# Patient Record
Sex: Male | Born: 1935 | Race: White | Hispanic: No | Smoking: Never smoker
Health system: Southern US, Community
[De-identification: ages and names within clinical notes are randomized; demographics above are authoritative.]

## PROBLEM LIST (undated history)

## (undated) DIAGNOSIS — I509 Heart failure, unspecified: Secondary | ICD-10-CM

## (undated) DIAGNOSIS — F40240 Claustrophobia: Secondary | ICD-10-CM

## (undated) DIAGNOSIS — I5020 Unspecified systolic (congestive) heart failure: Secondary | ICD-10-CM

## (undated) DIAGNOSIS — C801 Malignant (primary) neoplasm, unspecified: Secondary | ICD-10-CM

## (undated) DIAGNOSIS — E785 Hyperlipidemia, unspecified: Secondary | ICD-10-CM

## (undated) DIAGNOSIS — H3320 Serous retinal detachment, unspecified eye: Secondary | ICD-10-CM

## (undated) DIAGNOSIS — I10 Essential (primary) hypertension: Secondary | ICD-10-CM

## (undated) DIAGNOSIS — Z87898 Personal history of other specified conditions: Secondary | ICD-10-CM

## (undated) DIAGNOSIS — I480 Paroxysmal atrial fibrillation: Secondary | ICD-10-CM

## (undated) DIAGNOSIS — R3915 Urgency of urination: Secondary | ICD-10-CM

## (undated) DIAGNOSIS — Z8546 Personal history of malignant neoplasm of prostate: Secondary | ICD-10-CM

## (undated) DIAGNOSIS — K219 Gastro-esophageal reflux disease without esophagitis: Secondary | ICD-10-CM

## (undated) DIAGNOSIS — R7301 Impaired fasting glucose: Secondary | ICD-10-CM

## (undated) DIAGNOSIS — I639 Cerebral infarction, unspecified: Secondary | ICD-10-CM

## (undated) DIAGNOSIS — M1990 Unspecified osteoarthritis, unspecified site: Secondary | ICD-10-CM

## (undated) DIAGNOSIS — I2510 Atherosclerotic heart disease of native coronary artery without angina pectoris: Secondary | ICD-10-CM

## (undated) DIAGNOSIS — I350 Nonrheumatic aortic (valve) stenosis: Secondary | ICD-10-CM

## (undated) DIAGNOSIS — I255 Ischemic cardiomyopathy: Secondary | ICD-10-CM

## (undated) HISTORY — PX: COLONOSCOPY W/ POLYPECTOMY: SHX1380

## (undated) HISTORY — DX: Personal history of malignant neoplasm of prostate: Z85.46

## (undated) HISTORY — DX: Unspecified osteoarthritis, unspecified site: M19.90

## (undated) HISTORY — PX: HERNIA REPAIR: SHX51

## (undated) HISTORY — PX: TONSILLECTOMY: SUR1361

## (undated) HISTORY — PX: UMBILICAL HERNIA REPAIR: SHX196

## (undated) HISTORY — DX: Essential (primary) hypertension: I10

## (undated) HISTORY — DX: Atherosclerotic heart disease of native coronary artery without angina pectoris: I25.10

## (undated) HISTORY — DX: Hyperlipidemia, unspecified: E78.5

## (undated) HISTORY — DX: Serous retinal detachment, unspecified eye: H33.20

## (undated) HISTORY — DX: Ischemic cardiomyopathy: I25.5

## (undated) HISTORY — DX: Paroxysmal atrial fibrillation: I48.0

## (undated) HISTORY — PX: EYE SURGERY: SHX253

## (undated) HISTORY — DX: Cerebral infarction, unspecified: I63.9

## (undated) HISTORY — PX: CARDIAC CATHETERIZATION: SHX172

## (undated) HISTORY — DX: Nonrheumatic aortic (valve) stenosis: I35.0

## (undated) HISTORY — DX: Unspecified systolic (congestive) heart failure: I50.20

## (undated) HISTORY — PX: INGUINAL HERNIA REPAIR: SUR1180

## (undated) HISTORY — DX: Impaired fasting glucose: R73.01

---
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
## 1994-04-22 HISTORY — PX: PROSTATE SURGERY: SHX751

## 2004-01-25 ENCOUNTER — Ambulatory Visit: Payer: Self-pay | Admitting: Unknown Physician Specialty

## 2004-03-26 ENCOUNTER — Other Ambulatory Visit: Payer: Self-pay

## 2004-03-27 ENCOUNTER — Inpatient Hospital Stay: Payer: Self-pay | Admitting: Internal Medicine

## 2005-04-22 LAB — HM COLONOSCOPY

## 2005-10-29 ENCOUNTER — Ambulatory Visit: Payer: Self-pay | Admitting: Internal Medicine

## 2006-04-19 ENCOUNTER — Inpatient Hospital Stay: Payer: Self-pay | Admitting: Internal Medicine

## 2006-04-19 ENCOUNTER — Other Ambulatory Visit: Payer: Self-pay

## 2006-05-01 ENCOUNTER — Ambulatory Visit: Payer: Self-pay | Admitting: Internal Medicine

## 2006-05-01 LAB — CONVERTED CEMR LAB
ALT: 24 units/L (ref 0–40)
Albumin: 3.2 g/dL — ABNORMAL LOW (ref 3.5–5.2)
BUN: 17 mg/dL (ref 6–23)
Basophils Absolute: 0 10*3/uL (ref 0.0–0.1)
Basophils Relative: 0.3 % (ref 0.0–1.0)
CO2: 31 meq/L (ref 19–32)
Calcium: 9.2 mg/dL (ref 8.4–10.5)
Chloride: 100 meq/L (ref 96–112)
Chol/HDL Ratio, serum: 3.2
Cholesterol: 160 mg/dL (ref 0–200)
Creatinine, Ser: 1.4 mg/dL (ref 0.4–1.5)
Eosinophil percent: 0.4 % (ref 0.0–5.0)
GFR calc non Af Amer: 53 mL/min
Glomerular Filtration Rate, Af Am: 64 mL/min/{1.73_m2}
Glucose, Bld: 93 mg/dL (ref 70–99)
HCT: 45.8 % (ref 39.0–52.0)
HDL: 50.4 mg/dL (ref >39.0)
Hemoglobin: 15.5 g/dL (ref 13.0–17.0)
Hgb A1c MFr Bld: 6.4 % — ABNORMAL HIGH (ref 4.6–6.0)
LDL Cholesterol: 86 mg/dL (ref 0–99)
Lymphocytes Relative: 25.5 % (ref 12.0–46.0)
MCHC: 33.9 g/dL (ref 30.0–36.0)
MCV: 92.6 fL (ref 78.0–100.0)
Monocytes Absolute: 1 10*3/uL — ABNORMAL HIGH (ref 0.2–0.7)
Monocytes Relative: 8.3 % (ref 3.0–11.0)
Neutro Abs: 8.2 10*3/uL — ABNORMAL HIGH (ref 1.4–7.7)
Neutrophils Relative %: 65.5 % (ref 43.0–77.0)
PSA: 0.01 ng/mL — ABNORMAL LOW (ref 0.10–4.00)
Phosphorus Concentration: 3 mg/dL (ref 2.3–4.6)
Platelets: 366 10*3/uL (ref 150–400)
Potassium: 3.6 meq/L (ref 3.5–5.1)
RBC: 4.95 M/uL (ref 4.22–5.81)
RDW: 14.1 % (ref 11.5–14.6)
Sodium: 138 meq/L (ref 135–145)
Triglyceride fasting, serum: 119 mg/dL (ref 0–149)
VLDL: 24 mg/dL (ref 0–40)
WBC: 12.3 10*3/uL — ABNORMAL HIGH (ref 4.5–10.5)

## 2006-06-05 ENCOUNTER — Ambulatory Visit: Payer: Self-pay | Admitting: Internal Medicine

## 2006-08-21 HISTORY — PX: RETINAL DETACHMENT REPAIR W/ SCLERAL BUCKLE LE: SHX2338

## 2006-09-17 ENCOUNTER — Ambulatory Visit: Payer: Self-pay | Admitting: Internal Medicine

## 2006-09-17 DIAGNOSIS — E785 Hyperlipidemia, unspecified: Secondary | ICD-10-CM | POA: Insufficient documentation

## 2006-09-17 DIAGNOSIS — I10 Essential (primary) hypertension: Secondary | ICD-10-CM | POA: Insufficient documentation

## 2006-09-17 DIAGNOSIS — M159 Polyosteoarthritis, unspecified: Secondary | ICD-10-CM | POA: Insufficient documentation

## 2006-09-18 ENCOUNTER — Ambulatory Visit (HOSPITAL_COMMUNITY): Admission: RE | Admit: 2006-09-18 | Discharge: 2006-09-19 | Payer: Self-pay

## 2006-10-08 ENCOUNTER — Ambulatory Visit: Payer: Self-pay | Admitting: Family Medicine

## 2006-10-09 ENCOUNTER — Encounter (INDEPENDENT_AMBULATORY_CARE_PROVIDER_SITE_OTHER): Payer: Self-pay | Admitting: Internal Medicine

## 2006-10-09 LAB — CONVERTED CEMR LAB
Basophils Absolute: 0 10*3/uL (ref 0.0–0.1)
Basophils Relative: 0 % (ref 0–1)
Eosinophils Absolute: 0.2 10*3/uL (ref 0.0–0.7)
Eosinophils Relative: 2 % (ref 0–5)
HCT: 48.1 % (ref 39.0–52.0)
Hemoglobin: 15.8 g/dL (ref 13.0–17.0)
Lymphocytes Relative: 14 % (ref 12–46)
Lymphs Abs: 1.2 10*3/uL (ref 0.7–3.3)
MCHC: 32.8 g/dL (ref 30.0–36.0)
MCV: 92.3 fL (ref 78.0–100.0)
Monocytes Absolute: 1.2 10*3/uL — ABNORMAL HIGH (ref 0.2–0.7)
Monocytes Relative: 13 % — ABNORMAL HIGH (ref 3–11)
Neutro Abs: 6.1 10*3/uL (ref 1.7–7.7)
Neutrophils Relative %: 71 % (ref 43–77)
Platelets: 246 10*3/uL (ref 150–400)
RBC: 5.21 M/uL (ref 4.22–5.81)
RDW: 14.4 % — ABNORMAL HIGH (ref 11.5–14.0)
WBC: 8.6 10*3/uL (ref 4.0–10.5)

## 2006-11-21 ENCOUNTER — Telehealth (INDEPENDENT_AMBULATORY_CARE_PROVIDER_SITE_OTHER): Payer: Self-pay | Admitting: *Deleted

## 2006-11-27 ENCOUNTER — Telehealth (INDEPENDENT_AMBULATORY_CARE_PROVIDER_SITE_OTHER): Payer: Self-pay | Admitting: Internal Medicine

## 2007-01-15 ENCOUNTER — Ambulatory Visit: Payer: Self-pay | Admitting: Internal Medicine

## 2007-01-16 LAB — CONVERTED CEMR LAB
ALT: 16 units/L (ref 0–53)
Albumin: 3.9 g/dL (ref 3.5–5.2)
BUN: 15 mg/dL (ref 6–23)
CO2: 32 meq/L (ref 19–32)
Calcium: 9.6 mg/dL (ref 8.4–10.5)
Chloride: 106 meq/L (ref 96–112)
Cholesterol: 170 mg/dL (ref 0–200)
Creatinine, Ser: 1.2 mg/dL (ref 0.4–1.5)
GFR calc Af Amer: 76 mL/min
GFR calc non Af Amer: 63 mL/min
Glucose, Bld: 105 mg/dL — ABNORMAL HIGH (ref 70–99)
HDL: 55.9 mg/dL (ref >39.0)
LDL Cholesterol: 96 mg/dL (ref 0–99)
PSA: 0.01 ng/mL — ABNORMAL LOW (ref 0.10–4.00)
Phosphorus: 3.3 mg/dL (ref 2.3–4.6)
Potassium: 4 meq/L (ref 3.5–5.1)
Sodium: 144 meq/L (ref 135–145)
Total CHOL/HDL Ratio: 3
Triglycerides: 91 mg/dL (ref 0–149)
VLDL: 18 mg/dL (ref 0–40)

## 2007-03-23 ENCOUNTER — Telehealth (INDEPENDENT_AMBULATORY_CARE_PROVIDER_SITE_OTHER): Payer: Self-pay | Admitting: *Deleted

## 2007-07-15 ENCOUNTER — Ambulatory Visit: Payer: Self-pay | Admitting: Internal Medicine

## 2007-07-15 DIAGNOSIS — R7301 Impaired fasting glucose: Secondary | ICD-10-CM | POA: Insufficient documentation

## 2007-07-17 LAB — CONVERTED CEMR LAB
ALT: 17 units/L (ref 0–53)
AST: 22 units/L (ref 0–37)
Albumin: 3.8 g/dL (ref 3.5–5.2)
Alkaline Phosphatase: 56 units/L (ref 39–117)
BUN: 13 mg/dL (ref 6–23)
Basophils Absolute: 0 10*3/uL (ref 0.0–0.1)
Basophils Relative: 0.5 % (ref 0.0–1.0)
Bilirubin, Direct: 0.1 mg/dL (ref 0.0–0.3)
CO2: 32 meq/L (ref 19–32)
Calcium: 9.5 mg/dL (ref 8.4–10.5)
Chloride: 104 meq/L (ref 96–112)
Cholesterol: 169 mg/dL (ref 0–200)
Creatinine, Ser: 1.3 mg/dL (ref 0.4–1.5)
Eosinophils Absolute: 0.3 10*3/uL (ref 0.0–0.6)
Eosinophils Relative: 3.1 % (ref 0.0–5.0)
GFR calc Af Amer: 69 mL/min
GFR calc non Af Amer: 57 mL/min
Glucose, Bld: 109 mg/dL — ABNORMAL HIGH (ref 70–99)
HCT: 45.8 % (ref 39.0–52.0)
HDL: 49.7 mg/dL (ref >39.0)
Hemoglobin: 15 g/dL (ref 13.0–17.0)
LDL Cholesterol: 106 mg/dL — ABNORMAL HIGH (ref 0–99)
Lymphocytes Relative: 33 % (ref 12.0–46.0)
MCHC: 32.8 g/dL (ref 30.0–36.0)
MCV: 93.2 fL (ref 78.0–100.0)
Monocytes Absolute: 0.8 10*3/uL — ABNORMAL HIGH (ref 0.2–0.7)
Monocytes Relative: 8.8 % (ref 3.0–11.0)
Neutro Abs: 5 10*3/uL (ref 1.4–7.7)
Neutrophils Relative %: 54.6 % (ref 43.0–77.0)
PSA: 0.01 ng/mL — ABNORMAL LOW (ref 0.10–4.00)
Phosphorus: 3.1 mg/dL (ref 2.3–4.6)
Platelets: 271 10*3/uL (ref 150–400)
Potassium: 4 meq/L (ref 3.5–5.1)
RBC: 4.92 M/uL (ref 4.22–5.81)
RDW: 13.3 % (ref 11.5–14.6)
Sodium: 141 meq/L (ref 135–145)
Total Bilirubin: 0.7 mg/dL (ref 0.3–1.2)
Total CHOL/HDL Ratio: 3.4
Total Protein: 7.3 g/dL (ref 6.0–8.3)
Triglycerides: 67 mg/dL (ref 0–149)
VLDL: 13 mg/dL (ref 0–40)
WBC: 9.1 10*3/uL (ref 4.5–10.5)

## 2008-01-15 ENCOUNTER — Ambulatory Visit: Payer: Self-pay | Admitting: Internal Medicine

## 2008-01-18 LAB — CONVERTED CEMR LAB
ALT: 12 units/L (ref 0–53)
AST: 19 units/L (ref 0–37)
Albumin: 3.9 g/dL (ref 3.5–5.2)
Alkaline Phosphatase: 61 units/L (ref 39–117)
BUN: 11 mg/dL (ref 6–23)
Bilirubin, Direct: 0.1 mg/dL (ref 0.0–0.3)
CO2: 29 meq/L (ref 19–32)
Calcium: 9.2 mg/dL (ref 8.4–10.5)
Chloride: 108 meq/L (ref 96–112)
Cholesterol: 158 mg/dL (ref 0–200)
Creatinine, Ser: 1.4 mg/dL (ref 0.4–1.5)
GFR calc Af Amer: 64 mL/min
GFR calc non Af Amer: 53 mL/min
Glucose, Bld: 97 mg/dL (ref 70–99)
HDL: 51.1 mg/dL (ref >39.0)
LDL Cholesterol: 87 mg/dL (ref 0–99)
Phosphorus: 2.7 mg/dL (ref 2.3–4.6)
Potassium: 3.8 meq/L (ref 3.5–5.1)
Sodium: 142 meq/L (ref 135–145)
Total Bilirubin: 0.8 mg/dL (ref 0.3–1.2)
Total CHOL/HDL Ratio: 3.1
Total Protein: 7.6 g/dL (ref 6.0–8.3)
Triglycerides: 98 mg/dL (ref 0–149)
VLDL: 20 mg/dL (ref 0–40)

## 2008-02-16 ENCOUNTER — Ambulatory Visit: Payer: 59 | Admitting: Otolaryngology

## 2008-02-18 ENCOUNTER — Ambulatory Visit: Payer: 59 | Admitting: Otolaryngology

## 2008-02-26 ENCOUNTER — Encounter: Payer: Self-pay | Admitting: Internal Medicine

## 2008-03-09 ENCOUNTER — Encounter: Payer: Self-pay | Admitting: Internal Medicine

## 2008-03-15 ENCOUNTER — Encounter: Payer: Self-pay | Admitting: Internal Medicine

## 2008-03-15 ENCOUNTER — Ambulatory Visit: Payer: 59 | Admitting: Otolaryngology

## 2008-03-24 ENCOUNTER — Ambulatory Visit: Payer: Self-pay | Admitting: Internal Medicine

## 2008-03-24 DIAGNOSIS — I639 Cerebral infarction, unspecified: Secondary | ICD-10-CM | POA: Insufficient documentation

## 2008-05-03 ENCOUNTER — Ambulatory Visit: Payer: Self-pay | Admitting: Internal Medicine

## 2008-05-04 ENCOUNTER — Encounter: Payer: Self-pay | Admitting: Internal Medicine

## 2008-05-05 ENCOUNTER — Telehealth: Payer: Self-pay | Admitting: Internal Medicine

## 2008-05-17 ENCOUNTER — Encounter: Admission: RE | Admit: 2008-05-17 | Discharge: 2008-05-17 | Payer: Self-pay | Admitting: Neurology

## 2008-06-07 ENCOUNTER — Ambulatory Visit: Payer: Self-pay | Admitting: Cardiology

## 2008-06-10 ENCOUNTER — Ambulatory Visit: Payer: Self-pay

## 2008-06-10 ENCOUNTER — Encounter: Payer: Self-pay | Admitting: Internal Medicine

## 2008-06-13 ENCOUNTER — Encounter: Payer: 59 | Admitting: Neurology

## 2008-06-20 ENCOUNTER — Encounter: Payer: 59 | Admitting: Neurology

## 2008-06-23 ENCOUNTER — Encounter: Payer: Self-pay | Admitting: Internal Medicine

## 2008-07-15 ENCOUNTER — Ambulatory Visit: Payer: Self-pay | Admitting: Internal Medicine

## 2008-07-18 LAB — CONVERTED CEMR LAB
ALT: 14 units/L (ref 0–53)
AST: 18 units/L (ref 0–37)
Albumin: 3.9 g/dL (ref 3.5–5.2)
Alkaline Phosphatase: 66 units/L (ref 39–117)
BUN: 8 mg/dL (ref 6–23)
Basophils Absolute: 0.1 10*3/uL (ref 0.0–0.1)
Basophils Relative: 0.7 % (ref 0.0–3.0)
Bilirubin, Direct: 0 mg/dL (ref 0.0–0.3)
CO2: 33 meq/L — ABNORMAL HIGH (ref 19–32)
Calcium: 9.4 mg/dL (ref 8.4–10.5)
Chloride: 106 meq/L (ref 96–112)
Cholesterol: 224 mg/dL — ABNORMAL HIGH (ref 0–200)
Creatinine, Ser: 1.2 mg/dL (ref 0.4–1.5)
Direct LDL: 154.1 mg/dL
Eosinophils Absolute: 0.2 10*3/uL (ref 0.0–0.7)
Eosinophils Relative: 2.2 % (ref 0.0–5.0)
GFR calc non Af Amer: 62.7 mL/min (ref >60)
Glucose, Bld: 100 mg/dL — ABNORMAL HIGH (ref 70–99)
HCT: 45.7 % (ref 39.0–52.0)
HDL: 54.1 mg/dL (ref >39.00)
Hemoglobin: 15.8 g/dL (ref 13.0–17.0)
Lymphocytes Relative: 33.5 % (ref 12.0–46.0)
Lymphs Abs: 3.1 10*3/uL (ref 0.7–4.0)
MCHC: 34.6 g/dL (ref 30.0–36.0)
MCV: 91.3 fL (ref 78.0–100.0)
Monocytes Absolute: 0.7 10*3/uL (ref 0.1–1.0)
Monocytes Relative: 7.6 % (ref 3.0–12.0)
Neutro Abs: 5.2 10*3/uL (ref 1.4–7.7)
Neutrophils Relative %: 56 % (ref 43.0–77.0)
Platelets: 206 10*3/uL (ref 150.0–400.0)
Potassium: 3.9 meq/L (ref 3.5–5.1)
RBC: 5 M/uL (ref 4.22–5.81)
RDW: 13.6 % (ref 11.5–14.6)
Sodium: 143 meq/L (ref 135–145)
TSH: 2.19 microintl units/mL (ref 0.35–5.50)
Total Bilirubin: 1.1 mg/dL (ref 0.3–1.2)
Total CHOL/HDL Ratio: 4
Total Protein: 7.2 g/dL (ref 6.0–8.3)
Triglycerides: 90 mg/dL (ref 0.0–149.0)
VLDL: 18 mg/dL (ref 0.0–40.0)
WBC: 9.3 10*3/uL (ref 4.5–10.5)

## 2008-07-21 ENCOUNTER — Encounter: Payer: 59 | Admitting: Neurology

## 2008-08-17 ENCOUNTER — Encounter: Payer: 59 | Admitting: Internal Medicine

## 2008-08-20 ENCOUNTER — Encounter: Payer: 59 | Admitting: Internal Medicine

## 2008-11-15 ENCOUNTER — Ambulatory Visit: Payer: Self-pay | Admitting: Internal Medicine

## 2009-01-02 ENCOUNTER — Ambulatory Visit: Payer: 59 | Admitting: Internal Medicine

## 2009-01-02 ENCOUNTER — Encounter: Payer: Self-pay | Admitting: Internal Medicine

## 2009-01-09 ENCOUNTER — Ambulatory Visit: Payer: 59 | Admitting: Internal Medicine

## 2009-04-11 ENCOUNTER — Ambulatory Visit: Payer: Self-pay | Admitting: Internal Medicine

## 2009-04-12 LAB — CONVERTED CEMR LAB
ALT: 10 units/L (ref 0–53)
AST: 19 units/L (ref 0–37)
Albumin: 4.2 g/dL (ref 3.5–5.2)
Alkaline Phosphatase: 57 units/L (ref 39–117)
BUN: 15 mg/dL (ref 6–23)
Basophils Absolute: 0 10*3/uL (ref 0.0–0.1)
Basophils Relative: 0.1 % (ref 0.0–3.0)
Bilirubin, Direct: 0 mg/dL (ref 0.0–0.3)
CO2: 32 meq/L (ref 19–32)
Calcium: 9.8 mg/dL (ref 8.4–10.5)
Chloride: 102 meq/L (ref 96–112)
Cholesterol: 197 mg/dL (ref 0–200)
Creatinine, Ser: 1.2 mg/dL (ref 0.4–1.5)
Eosinophils Absolute: 0.2 10*3/uL (ref 0.0–0.7)
Eosinophils Relative: 2.6 % (ref 0.0–5.0)
GFR calc non Af Amer: 62.58 mL/min (ref >60)
Glucose, Bld: 108 mg/dL — ABNORMAL HIGH (ref 70–99)
HCT: 46.9 % (ref 39.0–52.0)
HDL: 51.8 mg/dL (ref >39.00)
Hemoglobin: 15.7 g/dL (ref 13.0–17.0)
Hgb A1c MFr Bld: 6.1 % (ref 4.6–6.5)
LDL Cholesterol: 125 mg/dL — ABNORMAL HIGH (ref 0–99)
Lymphocytes Relative: 35.7 % (ref 12.0–46.0)
Lymphs Abs: 2.9 10*3/uL (ref 0.7–4.0)
MCHC: 33.5 g/dL (ref 30.0–36.0)
MCV: 95.1 fL (ref 78.0–100.0)
Monocytes Absolute: 0.6 10*3/uL (ref 0.1–1.0)
Monocytes Relative: 7.2 % (ref 3.0–12.0)
Neutro Abs: 4.5 10*3/uL (ref 1.4–7.7)
Neutrophils Relative %: 54.4 % (ref 43.0–77.0)
Phosphorus: 3.2 mg/dL (ref 2.3–4.6)
Platelets: 199 10*3/uL (ref 150.0–400.0)
Potassium: 4.3 meq/L (ref 3.5–5.1)
RBC: 4.94 M/uL (ref 4.22–5.81)
RDW: 13.5 % (ref 11.5–14.6)
Sodium: 141 meq/L (ref 135–145)
TSH: 2.57 microintl units/mL (ref 0.35–5.50)
Total Bilirubin: 1.1 mg/dL (ref 0.3–1.2)
Total CHOL/HDL Ratio: 4
Total Protein: 7.6 g/dL (ref 6.0–8.3)
Triglycerides: 102 mg/dL (ref 0.0–149.0)
VLDL: 20.4 mg/dL (ref 0.0–40.0)
WBC: 8.2 10*3/uL (ref 4.5–10.5)

## 2009-08-03 ENCOUNTER — Encounter: Payer: Self-pay | Admitting: Internal Medicine

## 2009-08-03 ENCOUNTER — Telehealth: Payer: Self-pay | Admitting: Internal Medicine

## 2009-08-04 ENCOUNTER — Ambulatory Visit: Payer: Self-pay | Admitting: Internal Medicine

## 2009-08-21 ENCOUNTER — Ambulatory Visit: Payer: Self-pay | Admitting: Internal Medicine

## 2009-08-22 LAB — CONVERTED CEMR LAB
Albumin: 3.7 g/dL (ref 3.5–5.2)
BUN: 11 mg/dL (ref 6–23)
Basophils Absolute: 0.1 10*3/uL (ref 0.0–0.1)
Basophils Relative: 0.6 % (ref 0.0–3.0)
CO2: 33 meq/L — ABNORMAL HIGH (ref 19–32)
Calcium: 9.3 mg/dL (ref 8.4–10.5)
Chloride: 105 meq/L (ref 96–112)
Creatinine, Ser: 1.2 mg/dL (ref 0.4–1.5)
Eosinophils Absolute: 0.2 10*3/uL (ref 0.0–0.7)
Eosinophils Relative: 2.5 % (ref 0.0–5.0)
GFR calc non Af Amer: 62.52 mL/min (ref >60)
Glucose, Bld: 102 mg/dL — ABNORMAL HIGH (ref 70–99)
HCT: 46 % (ref 39.0–52.0)
Hemoglobin: 15.6 g/dL (ref 13.0–17.0)
Lymphocytes Relative: 38.3 % (ref 12.0–46.0)
Lymphs Abs: 3.4 10*3/uL (ref 0.7–4.0)
MCHC: 34 g/dL (ref 30.0–36.0)
MCV: 94.1 fL (ref 78.0–100.0)
Monocytes Absolute: 0.7 10*3/uL (ref 0.1–1.0)
Monocytes Relative: 8.4 % (ref 3.0–12.0)
Neutro Abs: 4.4 10*3/uL (ref 1.4–7.7)
Neutrophils Relative %: 50.2 % (ref 43.0–77.0)
Phosphorus: 3 mg/dL (ref 2.3–4.6)
Platelets: 231 10*3/uL (ref 150.0–400.0)
Potassium: 4.3 meq/L (ref 3.5–5.1)
RBC: 4.88 M/uL (ref 4.22–5.81)
RDW: 14.7 % — ABNORMAL HIGH (ref 11.5–14.6)
Sodium: 143 meq/L (ref 135–145)
WBC: 8.8 10*3/uL (ref 4.5–10.5)

## 2009-09-28 ENCOUNTER — Encounter (INDEPENDENT_AMBULATORY_CARE_PROVIDER_SITE_OTHER): Payer: Self-pay | Admitting: *Deleted

## 2009-09-28 ENCOUNTER — Ambulatory Visit: Payer: Self-pay | Admitting: Internal Medicine

## 2009-10-10 ENCOUNTER — Ambulatory Visit: Payer: Self-pay | Admitting: Internal Medicine

## 2010-02-26 ENCOUNTER — Ambulatory Visit: Payer: Self-pay | Admitting: Family Medicine

## 2010-02-26 DIAGNOSIS — J069 Acute upper respiratory infection, unspecified: Secondary | ICD-10-CM | POA: Insufficient documentation

## 2010-04-10 ENCOUNTER — Ambulatory Visit: Payer: Self-pay | Admitting: Internal Medicine

## 2010-04-12 LAB — CONVERTED CEMR LAB
ALT: 11 units/L (ref 0–53)
AST: 16 units/L (ref 0–37)
Albumin: 3.7 g/dL (ref 3.5–5.2)
Alkaline Phosphatase: 58 units/L (ref 39–117)
BUN: 16 mg/dL (ref 6–23)
Basophils Absolute: 0 10*3/uL (ref 0.0–0.1)
Basophils Relative: 0.5 % (ref 0.0–3.0)
Bilirubin, Direct: 0.1 mg/dL (ref 0.0–0.3)
CO2: 31 meq/L (ref 19–32)
Calcium: 9.6 mg/dL (ref 8.4–10.5)
Chloride: 105 meq/L (ref 96–112)
Cholesterol: 213 mg/dL — ABNORMAL HIGH (ref 0–200)
Creatinine, Ser: 1.5 mg/dL (ref 0.4–1.5)
Direct LDL: 141.1 mg/dL
Eosinophils Absolute: 0.2 10*3/uL (ref 0.0–0.7)
Eosinophils Relative: 2.3 % (ref 0.0–5.0)
GFR calc non Af Amer: 47.15 mL/min — ABNORMAL LOW (ref >60.00)
Glucose, Bld: 91 mg/dL (ref 70–99)
HCT: 46 % (ref 39.0–52.0)
HDL: 49.4 mg/dL (ref >39.00)
Hemoglobin: 15.6 g/dL (ref 13.0–17.0)
Lymphocytes Relative: 36.3 % (ref 12.0–46.0)
Lymphs Abs: 3.1 10*3/uL (ref 0.7–4.0)
MCHC: 33.8 g/dL (ref 30.0–36.0)
MCV: 93.2 fL (ref 78.0–100.0)
Monocytes Absolute: 0.7 10*3/uL (ref 0.1–1.0)
Monocytes Relative: 7.7 % (ref 3.0–12.0)
Neutro Abs: 4.5 10*3/uL (ref 1.4–7.7)
Neutrophils Relative %: 53.2 % (ref 43.0–77.0)
Phosphorus: 3 mg/dL (ref 2.3–4.6)
Platelets: 225 10*3/uL (ref 150.0–400.0)
Potassium: 4.4 meq/L (ref 3.5–5.1)
RBC: 4.94 M/uL (ref 4.22–5.81)
RDW: 14.9 % — ABNORMAL HIGH (ref 11.5–14.6)
Sodium: 142 meq/L (ref 135–145)
TSH: 3.06 microintl units/mL (ref 0.35–5.50)
Total Bilirubin: 0.5 mg/dL (ref 0.3–1.2)
Total CHOL/HDL Ratio: 4
Total Protein: 7.1 g/dL (ref 6.0–8.3)
Triglycerides: 80 mg/dL (ref 0.0–149.0)
VLDL: 16 mg/dL (ref 0.0–40.0)
WBC: 8.4 10*3/uL (ref 4.5–10.5)

## 2010-05-22 NOTE — Progress Notes (Signed)
  Phone Note Call from Patient   Caller: Patient Summary of Call: Pt saw Dr Terrace Arabia yesterday at [facility]. Dr Terrace Arabia wants to schedule him for another MRI, it is not scheduled yet but they will be calling him. He will call us to report to you after he has the MRI. He said the Dr was OK. Initial call taken by: Carlton Adam,  May 05, 2008 12:51 PM  Follow-up for Phone Call        good to know Randie Heinz that they got him in so fast I felt another MRI might be needed due to the change in his status Follow-up by: Cindee Salt MD,  May 05, 2008 1:02 PM
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
## 2010-05-22 NOTE — Letter (Signed)
Summary: [facility]  [facility]   Imported By: Maryln Gottron 02/03/2009 15:13:28  _____________________________________________________________________  External Attachment:    Type:   Image     Comment:   External Document  Appended Document: [facility] recurrent L leg ulcer Rx restarted

## 2010-05-22 NOTE — Assessment & Plan Note (Signed)
Summary: follow up from MRI/ 1:30 per dr Alphonsus Sias   Vital Signs:  Patient Profile:   77 Years Old Male Weight:      203.13 pounds Temp:     98.2 degrees F oral Pulse rate:   80 / minute Pulse rhythm:   regular BP sitting:   140 / 98  (left arm) Cuff size:   regular  Vitals Entered By: Sydell Axon (March 24, 2008 1:23 PM)                 Chief Complaint:  MRI follow-up.  History of Present Illness: having 1 month of problems with dizziness and balance problems No hearing loss or tinnitus Had 3 falls --once going up steps, others were not related to current symptoms  Saw Dr Elenore Rota MRI done which showed past cerebellar stroke  Wife notes that his walking is different He notes increased pain in left arm  Not sleeping well  More headaches Feels in the back of his head--reminds him of when he had stroke many years ago No diplopia or unilateral vision loss  Takes ASA but inconsistently  Very mild vertigo past strokes gave same balance problems    Current Allergies (reviewed today): No known allergies   Past Medical History:    Reviewed history from 07/15/2007 and no changes required:       Prostate cancer, hx of------------------------------------------Dr Evelene Croon       Hyperlipidemia       Hypertension       Osteoarthritis       Cerebrovascular accident, hx of       Impaired fasting glucose--5/07       Detached retina--5/08 Right                Past Surgical History:    Reviewed history from 10/08/2006 and no changes required:       CVA- (1994)       LIH (2002-2003)       Pneumonia (2005)       Pneumonia (03/2006)       Prostatectomy- total (2002)--  Evelene Croon   Social History:    Reviewed history from 07/15/2007 and no changes required:       Marital Status: Married       Children: 1 adopted son       Occupation: retired       Never Smoked       Chewed till 2006       Alcohol use-no              Has living will       Wife is health care POA   would accept CPR but no prolonged machines. Not sure about G-tube       (discussed 10/29/05)    Review of Systems       no nausea or vomiting appetite is okay weight stable tramadol does help arthritis   Physical Exam  General:     alert.  NAD Eyes:     pupils equal, pupils round, pupils reactive to light, and no nystagmus.   Mouth:     no erythema and no lesions.   Neck:     supple and no masses.   Neurologic:     alert & oriented X3 and cranial nerves II-XII intact.   Gait is slightly wide based and ataxic Romberg is negative but he sways some Psych:     normally interactive, good eye contact, not anxious appearing, and not  depressed appearing.      Impression & Recommendations:  Problem # 1:  CEREBELLAR INFARCT (ICD-436) Assessment: New he has prior cerebellar stroke per MRI May have had new one about 1 month ago but hard to tell still could have vestibular component--though no sig nystagmus and vertigo is minimal  P: needs ASA every day    discussed need for a cane    could try trial of meclizine--continue only if it helps  Problem # 2:  OSTEOARTHRITIS (ICD-715.90) Assessment: Unchanged sig issue but satisfied with tramadol and tylenol has needed more of the tramadol on some days  His updated medication list for this problem includes:    Tramadol Hcl 50 Mg Tabs (Tramadol hcl) .[name]... 1 three times a day as needed for arthritis pain    Aspirin Ec 81 Mg Tbec (Aspirin) .[name]... 1 daily   Problem # 3:  HYPERTENSION (ICD-401.9) Assessment: Unchanged BP by me today 136/86 I would not increase meds now even if subacute stroke  His updated medication list for this problem includes:    Hyzaar 50-12.5 Mg Tabs (Losartan potassium-hctz) .[name]... 1 daily by mouth  BP today: 140/98 Prior BP: 130/80 (01/15/2008)  Labs Reviewed: Creat: 1.4 (01/15/2008) Chol: 158 (01/15/2008)   HDL: 51.1 (01/15/2008)   LDL: 87 (01/15/2008)   TG: 98 (01/15/2008)   Problem # 4:   HYPERLIPIDEMIA (ICD-272.4) Assessment: Unchanged pretty much at goal  His updated medication list for this problem includes:    Simvastatin 20 Mg Tabs (Simvastatin) .[name]... 1 at bedtime  Labs Reviewed: Chol: 158 (01/15/2008)   HDL: 51.1 (01/15/2008)   LDL: 87 (01/15/2008)   TG: 98 (01/15/2008) SGOT: 19 (01/15/2008)   SGPT: 12 (01/15/2008)   Complete Medication List: 1)  Plavix 75 Mg Tabs (Clopidogrel bisulfate) .[name].. 1 daily by mouth 2)  Simvastatin 20 Mg Tabs (Simvastatin) .[name].. 1 at bedtime 3)  Hyzaar 50-12.5 Mg Tabs (Losartan potassium-hctz) .[name].. 1 daily by mouth 4)  Fish Oil Oil (Fish oil) .[name].. 1 spoonful daily 5)  Vitamin B-12 100 Mcg Tabs (Cyanocobalamin) .[name].. 1 daily 6)  Tramadol Hcl 50 Mg Tabs (Tramadol hcl) .[name].. 1 three times a day as needed for arthritis pain 7)  Aspirin Ec 81 Mg Tbec (Aspirin) .[name].. 1 daily   Patient Instructions: 1)  Please take the aspirin 81mg  every day 2)  Try meclizine 25mg  three times a day ---continue if it helps your balance, otherwise can stop 3)  Use a cane!!!!! 4)  Keep March appointment   Prescriptions: TRAMADOL HCL 50 MG TABS (TRAMADOL HCL) 1 three times a day as needed for arthritis pain  #270 x 0   Entered and Authorized by:   Cindee Salt MD   Signed by:   Cindee Salt MD on 03/24/2008   Method used:   Print then Give to Patient   RxID:   [id number]  ] Current Allergies (reviewed today): No known allergies

## 2010-05-22 NOTE — Progress Notes (Signed)
  Phone Note From Other Clinic   Caller: Dr [facility] Summary of Call: call re need to continue off plavix x 7-10 more days due to blood in eye.  Has return appt there 12/03/06. reviewed Dr Karle Starch response to this same quention from last wk, to continue off as long as eye surgeons need off. discusssed this with Dr Acquanetta Belling.---contact person:  Dr Moises Blood assistant [phone] call taken 11/26/06 Initial call taken by: Gildardo Griffes FNP,  November 27, 2006 8:22 AM  Follow-up for Phone Call        Thanks!! -------noted--Kyle [name] Follow-up by: Cindee Salt MD,  November 27, 2006 9:29 PM

## 2010-05-22 NOTE — Progress Notes (Signed)
Summary: High white count  Phone Note From Other Clinic   Caller: [facility] Call For: Dr. Alphonsus Sias Summary of Call: [facility] to set up appt for pt, he has a white count of 24.3, they wanted pt to be seen at the ER but patient refused. Jacki Cones and I checked with all physicans today and they are all booked up. [facility] is doing a chest x-ray now, pt has no fever, just complaining of being sore, they will fax over all clinic notes and lab results. I scheduled pt to see Dr. Alphonsus Sias on Monday @ 12:00. Please advise if I can add him on Friday and were? or today ( we are booked up til 4:45) Initial call taken by: Mervin Hack CMA Duncan Dull),  August 03, 2009 10:23 AM  Follow-up for Phone Call        add on tomorrow afternoon Follow-up by: Cindee Salt MD,  August 03, 2009 12:53 PM  Additional Follow-up for Phone Call Additional follow up Details #1::        patient added on @ 4:00 Additional Follow-up by: Mervin Hack CMA Duncan Dull),  August 03, 2009 2:23 PM

## 2010-05-22 NOTE — Assessment & Plan Note (Signed)
Summary: 6 month follow-up   Vital Signs:  Patient profile:   77 year old male Weight:      204 pounds Temp:     97.7 degrees F oral Pulse rate:   72 / minute Pulse rhythm:   regular BP sitting:   138 / 80  (left arm) Cuff size:   large  Vitals Entered By: Mervin Hack CMA Duncan Dull) (April 11, 2009 7:53 AM) CC: 6 month follow-up   History of Present Illness: doing fine  still with arthritis aching uses tramadol regularly when it is bad--other times he can skip does give him relief--along with tylenol advil didn't help  Leg ulcer has healed occurred when he fell  Has stable nocturia occ daytime urgency but nothing worrisome  Still with mild balance problems No speech, swallowing problems No weakness  No chest pain No SOB  taking 2 red yeast rice daily no problems with this  Allergies: No Known Drug Allergies  Past History:  Past medical, surgical, family and social histories (including risk factors) reviewed for relevance to current acute and chronic problems.  Past Medical History: Reviewed history from 07/15/2007 and no changes required. Prostate cancer, hx of------------------------------------------Dr Evelene Croon Hyperlipidemia Hypertension Osteoarthritis Cerebrovascular accident, hx of Impaired fasting glucose--5/07 Detached retina--5/08 Right  Past Surgical History: Reviewed history from 10/08/2006 and no changes required. CVA- (1994) LIH (2002-2003) Pneumonia (2005) Pneumonia (03/2006) Prostatectomy- total (2002)--  Evelene Croon  Family History: Reviewed history from 07/15/2007 and no changes required. Father: deceased age 76- MI Mother: deceased age 55- MI Siblings: 5 brothers- 3 of who are deceased, 3 sisters CAD in parents, 2 brothers died of MI Prostate cancer in 1 brother No DM Melanoma in 1 brohter  Social History: Reviewed history from 07/15/2007 and no changes required. Marital Status: Married Children: 1 adopted son Occupation:  retired Never Smoked Chewed till 2006 Alcohol use-no  Has living will Wife is health care POA would accept CPR but no prolonged machines. Not sure about G-tube (discussed 10/29/05)  Review of Systems       Asks about zostavax--I recommended it and gave Rx Sleeps fairly well---nocturia x 2-3 appetite is fine weight stable  Physical Exam  General:  alert and normal appearance.   Neck:  supple, no masses, no thyromegaly, no carotid bruits, and no cervical lymphadenopathy.   Lungs:  normal respiratory effort and normal breath sounds.   Heart:  normal rate, regular rhythm, no murmur, and no gallop.  Rare skip Abdomen:  soft and non-tender.   Pulses:  2+ pedal pulses Extremities:  no edema Neurologic:  alert & oriented X3, strength normal in all extremities, and gait normal.   Psych:  normally interactive, good eye contact, not anxious appearing, and not depressed appearing.     Impression & Recommendations:  Problem # 1:  CEREBELLAR INFARCT (ICD-436) Assessment Comment Only doing well needs to use walking stick when on unlevel ground  Problem # 2:  HYPERTENSION (ICD-401.9) Assessment: Unchanged  good control no changes needed  His updated medication list for this problem includes:    Hyzaar 50-12.5 Mg Tabs (Losartan potassium-hctz) .[name]... 1 daily by mouth  BP today: 138/80 Prior BP: 130/80 (11/15/2008)  Labs Reviewed: K+: 3.9 (07/15/2008) Creat: : 1.2 (07/15/2008)   Chol: 224 (07/15/2008)   HDL: 54.10 (07/15/2008)   LDL: 87 (01/15/2008)   TG: 90.0 (07/15/2008)  Orders: TLB-Renal Function Panel (80069-RENAL) TLB-CBC Platelet - w/Differential (85025-CBCD) TLB-TSH (Thyroid Stimulating Hormone) (84443-TSH) Venipuncture (64332)  Problem # 3:  HYPERLIPIDEMIA (ICD-272.4)  Assessment: Comment Only  on fish oil and red yeast rice will check labs  Orders: TLB-Lipid Panel (80061-LIPID) TLB-Hepatic/Liver Function Pnl (80076-HEPATIC)  Problem # 4:  OSTEOARTHRITIS  (ICD-715.90) Assessment: Comment Only does okay with tramadol and tylenol  His updated medication list for this problem includes:    Tramadol Hcl 50 Mg Tabs (Tramadol hcl) .[name]... 1 three times a day as needed for arthritis pain    Aspirin Ec 81 Mg Tbec (Aspirin) .[name]... 1 daily  Complete Medication List: 1)  Hyzaar 50-12.5 Mg Tabs (Losartan potassium-hctz) .[name].. 1 daily by mouth 2)  Fish Oil Oil (Fish oil) .[name].. 1 spoonful daily 3)  Vitamin B-12 100 Mcg Tabs (Cyanocobalamin) .[name].. 1 daily 4)  Tramadol Hcl 50 Mg Tabs (Tramadol hcl) .[name].. 1 three times a day as needed for arthritis pain 5)  Aspirin Ec 81 Mg Tbec (Aspirin) .[name].. 1 daily 6)  Red Yeast Rice 600 Mg Caps (Red yeast rice extract) .... 2 tabs daily  Other Orders: TLB-A1C / Hgb A1C (Glycohemoglobin) (83036-A1C)  Patient Instructions: 1)  Please schedule a follow-up appointment in 6 months .  Prescriptions: TRAMADOL HCL 50 MG TABS (TRAMADOL HCL) 1 three times a day as needed for arthritis pain  #270 x 3   Entered and Authorized by:   Cindee Salt MD   Signed by:   Cindee Salt MD on 04/11/2009   Method used:   Print then Give to Patient   RxID:   [id number] JYNWGN 50-12.5 MG TABS (LOSARTAN POTASSIUM-HCTZ) 1 daily by mouth  #90 x 3   Entered and Authorized by:   Cindee Salt MD   Signed by:   Cindee Salt MD on 04/11/2009   Method used:   Print then Give to Patient   RxID:   [id number]   Current Allergies (reviewed today): No known allergies    Influenza Immunization History:    Influenza # 1:  Historical (01/20/2009)

## 2010-05-22 NOTE — Progress Notes (Signed)
Summary: ok to d/c plavix  Phone Note Call from Patient Call back at Home Phone [phone] Call back at cell [phone]   Caller: Patient Call For: letvak Summary of Call: pt needs permission to d/c plavix before eye surgery. He needs a written  note to take to [facility]. Initial call taken by: Liane Comber,  March 23, 2007 10:46 AM  Follow-up for Phone Call        note written--okay to stop in the perioperative period Follow-up by: Cindee Salt MD,  March 23, 2007 2:14 PM  Additional Follow-up for Phone Call Additional follow up Details #1::        phoned pt. to pick up note Additional Follow-up by: Wandra Mannan,  March 23, 2007 2:40 PM

## 2010-05-22 NOTE — Progress Notes (Signed)
Summary: Needs to stop his Plavix  Phone Note Call from Patient   Caller: Spouse Call For: Dr. Alphonsus Sias Summary of Call: Pt has had a retina detachment and had surgery yesterday.  Pt went back for f/u today and was told to call you to see if it is okay for him to stay off of his Plavix until next Wednesday because of bleeding. Is this okay? Initial call taken by: Sydell Axon,  November 21, 2006 11:40 AM  Follow-up for Phone Call        yes--please restart as soon as the eye surgeon feels it is safe to Follow-up by: Cindee Salt MD,  November 21, 2006 1:18 PM  Additional Follow-up for Phone Call Additional follow up Details #1::        Pt's wife informed as instructed. Additional Follow-up by: Sydell Axon,  November 21, 2006 2:14 PM

## 2010-05-22 NOTE — Assessment & Plan Note (Signed)
Summary: ROA 6 MTHS CYD   Vital Signs:  Patient profile:   77 year old male Weight:      205 pounds Temp:     98.4 degrees F oral Pulse rate:   60 / minute Pulse rhythm:   regular BP sitting:   120 / 60  (left arm) Cuff size:   large  Vitals Entered By: Mervin Hack CMA (AAMA) (October 10, 2009 8:00 AM) CC: 6 month follow-up   History of Present Illness: Doing well clinically over the pneumonia CXR was normal recently in follow up  Still with some arthritic pain needs 1/2-1 tramadol regularly---takes up to 3 per day uses tylenol as well doesn't feel restricted  still with ongoing energy problems trouble initiating activities--then does okay Some balance issues--not better but no worse uses walking stick as needed  No chest pain Did notice a "ticking like a clock" in chest yesterday---not bothersome No sig change in DOE---can walk 10 minutes on flat ground no headaches  continues on fish oil and red yeast rice no myalgias  Allergies: No Known Drug Allergies  Past History:  Past medical, surgical, family and social histories (including risk factors) reviewed for relevance to current acute and chronic problems.  Past Medical History: Reviewed history from 07/15/2007 and no changes required. Prostate cancer, hx of------------------------------------------Dr Evelene Croon Hyperlipidemia Hypertension Osteoarthritis Cerebrovascular accident, hx of Impaired fasting glucose--5/07 Detached retina--5/08 Right  Past Surgical History: Reviewed history from 10/08/2006 and no changes required. CVA- (1994) LIH (2002-2003) Pneumonia (2005) Pneumonia (03/2006) Prostatectomy- total (2002)--  Evelene Croon  Family History: Reviewed history from 07/15/2007 and no changes required. Father: deceased age 2- MI Mother: deceased age 67- MI Siblings: 5 brothers- 3 of who are deceased, 3 sisters CAD in parents, 2 brothers died of MI Prostate cancer in 1 brother No DM Melanoma in 1  brohter  Social History: Reviewed history from 07/15/2007 and no changes required. Marital Status: Married Children: 1 adopted son Occupation: retired Never Smoked Chewed till 2006 Alcohol use-no  Has living will Wife is health care POA would accept CPR but no prolonged machines. Not sure about G-tube (discussed 10/29/05)  Review of Systems       sleeps okay but dreams a lot--does awaken at times nocturia x 2 No sig daytime problems but does have some urgency in day appetite is fine weight stable  Physical Exam  General:  alert and normal appearance.   Neck:  supple, no masses, no thyromegaly, no carotid bruits, and no cervical lymphadenopathy.   Lungs:  normal respiratory effort and normal breath sounds.   Heart:  normal rate, regular rhythm, no murmur, and no gallop.   Msk:  no joint tenderness and no joint swelling.   Extremities:  no edema Neurologic:  alert & oriented X3 and strength normal in all extremities.   Skin:  tick on left thoracic back removed cleanly with pickups Psych:  normally interactive, good eye contact, not anxious appearing, and not depressed appearing.     Impression & Recommendations:  Problem # 1:  CEREBELLAR INFARCT (ICD-436) Assessment Unchanged on OTC chol regimen BP good asa daily  Problem # 2:  HYPERTENSION (ICD-401.9) Assessment: Unchanged good control no changes needed labs next time  His updated medication list for this problem includes:    Hyzaar 50-12.5 Mg Tabs (Losartan potassium-hctz) .[name]... 1 daily by mouth  BP today: 120/60 Prior BP: 140/90 (08/04/2009)  Labs Reviewed: K+: 4.3 (08/21/2009) Creat: : 1.2 (08/21/2009)   Chol: 197 (04/11/2009)  HDL: 51.80 (04/11/2009)   LDL: 125 (04/11/2009)   TG: 102.0 (04/11/2009)  Problem # 3:  OSTEOARTHRITIS (ICD-715.90) Assessment: Unchanged reasonable control with regular meds  His updated medication list for this problem includes:    Tramadol Hcl 50 Mg Tabs (Tramadol hcl)  .[name]... 1 three times a day as needed for arthritis pain    Aspirin Ec 81 Mg Tbec (Aspirin) .[name]... 1 daily  Problem # 4:  HYPERLIPIDEMIA (ICD-272.4) reasonable control with OTC meds  Labs Reviewed: SGOT: 19 (04/11/2009)   SGPT: 10 (04/11/2009)   HDL:51.80 (04/11/2009), 54.10 (07/15/2008)  LDL:125 (04/11/2009), 87 (40/98/1191)  Chol:197 (04/11/2009), 224 (07/15/2008)  Trig:102.0 (04/11/2009), 90.0 (07/15/2008)  Complete Medication List: 1)  Hyzaar 50-12.5 Mg Tabs (Losartan potassium-hctz) .[name].. 1 daily by mouth 2)  Tramadol Hcl 50 Mg Tabs (Tramadol hcl) .[name].. 1 three times a day as needed for arthritis pain 3)  Fish Oil Oil (Fish oil) .[name].. 1 spoonful daily 4)  Vitamin B-12 100 Mcg Tabs (Cyanocobalamin) .[name].. 1 daily 5)  Aspirin Ec 81 Mg Tbec (Aspirin) .[name].. 1 daily 6)  Red Yeast Rice 600 Mg Caps (Red yeast rice extract) .... 2 tabs daily  Patient Instructions: 1)  Please schedule a follow-up appointment in 6 months .  Prescriptions: HYZAAR 50-12.5 MG TABS (LOSARTAN POTASSIUM-HCTZ) 1 daily by mouth  #90 x 3   Entered by:   Mervin Hack CMA (AAMA)   Authorized by:   Cindee Salt MD   Signed by:   Mervin Hack CMA (AAMA) on 10/10/2009   Method used:   Print then Give to Patient   RxID:   [id number]   Current Allergies (reviewed today): No known allergies

## 2010-05-22 NOTE — Assessment & Plan Note (Signed)
Summary: CLEARANCE FOR EYE SURGERY   Vital Signs:  Patient Profile:   77 Years Old Male Weight:      204.38 pounds Temp:     97.6 degrees F oral Pulse rate:   72 / minute BP sitting:   124 / 70  (left arm)  Vitals Entered By: Wandra Mannan (Sep 17, 2006 12:30 PM)               Chief Complaint:  surgery clearance.  History of Present Illness: having retinal surgery on R> Got hit in eye by tree limb about 6 weeks ago, now has almost no vision in eye.  Feels fine now except for arthritis pain in hands and knees  No chest pain No SOB--does get DOE which has not changed (has fair stamina) No edema Sleeps fair--occ bad nights.  No PND or orthopnea No headache or weakness (or any other neuro symptoms)    Past Medical History:    Prostate cancer, hx of    Hyperlipidemia    Hypertension    Osteoarthritis    Cerebrovascular accident, hx of     Review of Systems      See HPI       uses tramadol as needed for arthritis if tylenol ineffective   Physical Exam  General:     alert and normal appearance.   Neck:     supple, no thyromegaly, no JVD, and no carotid bruits.   Lungs:     normal respiratory effort and normal breath sounds.   Heart:     normal rate, regular rhythm, no murmur, and no gallop.   Abdomen:     soft, non-tender, no hepatomegaly, and no splenomegaly.   Pulses:     1+  in feet Extremities:     no edema Skin:     Intact without suspicious lesions or rashes Psych:     normally interactive, good eye contact, not anxious appearing, and not depressed appearing.   Additional Exam:     EKG--sinus, LAD, NSIVCD, no ischemic changes    Impression & Recommendations:  Problem # 1:  PREOPERATIVE EXAMINATION (ICD-V72.84) No contraindications to proceeding with surgery Restart his plavix as soon as possible following the surgery  Medications Added to Medication List This Visit: 1)  Plavix 75 Mg Tabs (Clopidogrel bisulfate) .[name].. 1 daily 2)   Simvastatin 20 Mg Tabs (Simvastatin) .[name].. 1 at bedtime 3)  Hyzaar 50-12.5 Mg Tabs (Losartan potassium-hctz) .[name].. 1 daily 4)  Fish Oil Oil (Fish oil) .[name].. 1 spoonful daily 5)  Vitamin B-12 100 Mcg Tabs (Cyanocobalamin) .[name].. 1 daily 6)  Tramadol Hcl 50 Mg Tabs (Tramadol hcl) .[name].. 1 three times a day as needed for arthritis pain  Other Orders: EKG w/ Interpretation (93000)   Patient Instructions: 1)  Please schedule a follow-up appointment as needed.

## 2010-05-22 NOTE — Assessment & Plan Note (Signed)
Summary: BAD COLD AND FEVERISH...CYD   Vital Signs:  Patient profile:   77 year old male Height:      68 inches Weight:      209.25 pounds BMI:     31.93 Temp:     98.6 degrees F oral Pulse rate:   80 / minute Pulse rhythm:   regular BP sitting:   134 / 74  (left arm) Cuff size:   large  Vitals Entered By: Delilah Shan CMA (AAMA) (February 26, 2010 9:30 AM) CC: Bad cold, feverish, achey   History of Present Illness: 4-5 days.  "I thought I was getting better, but then it got worse."  Intially with ear pain, now with myalgias "all over".  Discolored rhinorrhea and tight across face.  No more ear pain.  alternating sweats and chills.  Minimal cough, minimal sputum, from post nasal gtt.  Took tylenol  but last dose was on Saturday.    Allergies: No Known Drug Allergies  Review of Systems       See HPI.  Otherwise negative.    Physical Exam  General:  GEN: nad, alert and oriented HEENT: mucous membranes moist, TM w/o erythema, nasal epithelium injected, OP with cobblestoning, mas sinus minimall tender to palpation bilaterally NECK: supple w/o LA CV: rrr. PULM: ctab, no inc wob ABD: soft, +bs EXT: no edema    Impression & Recommendations:  Problem # 1:  URI (ICD-465.9) Pt to hold amoxil rx.  This may be viral and it may resolve with just supportive tx.  follow up as needed.  If no decrease in symptoms, then start the amoxil.  Nontoxic and okay for outpatient follow up.  He agrees wiht plan.  His updated medication list for this problem includes:    Aspirin Ec 81 Mg Tbec (Aspirin) .[name]... 1 daily  Complete Medication List: 1)  Hyzaar 50-12.5 Mg Tabs (Losartan potassium-hctz) .[name].. 1 daily by mouth 2)  Tramadol Hcl 50 Mg Tabs (Tramadol hcl) .[name].. 1 three times a day as needed for arthritis pain 3)  Fish Oil Oil (Fish oil) .[name].. 1 spoonful daily 4)  Vitamin B-12 100 Mcg Tabs (Cyanocobalamin) .[name].. 1 daily 5)  Aspirin Ec 81 Mg Tbec (Aspirin) .[name].. 1 daily 6)  Red Yeast Rice 600  Mg Caps (Red yeast rice extract) .... 2 tabs daily 7)  Amoxicillin 875 Mg Tabs (Amoxicillin) .[name].. 1 by mouth two times a day  Patient Instructions: 1)  Get plenty of rest, drink lots of clear liquids, and use Tylenol 2 tabs by mouth three times a day for fever and comfort. Start the antibiotics on Wednesday or Thursday if you aren't feeling better.  I would gargle with salt water for your throat.  Take care.  Prescriptions: AMOXICILLIN 875 MG TABS (AMOXICILLIN) 1 by mouth two times a day  #20 x 0   Entered and Authorized by:   Crawford Givens MD   Signed by:   Crawford Givens MD on 02/26/2010   Method used:   Print then Give to Patient   RxID:   [id number]    Orders Added: 1)  Est. Patient Level III [14782]    Current Allergies (reviewed today): No known allergies

## 2010-05-22 NOTE — Assessment & Plan Note (Signed)
Summary: CHILLS,UPSET STOMACH/CLE   Vital Signs:  Patient Profile:   77 Years Old Male Weight:      202 pounds Temp:     99.5 degrees F oral Pulse rate:   80 / minute BP sitting:   139 / 103  (right arm)  Vitals Entered By: Cooper Render (October 08, 2006 3:58 PM)               Chief Complaint:  fever, chills, cough, and diarrhea this am.  History of Present Illness: Here with wife due to fever/chills, ache all over, cough--hacking and nonproductive, diarrhea this AM x3-4.  NO N/V, ate develed egg sandwich for lunch.    Recent eye surg for detatched retina--new gtts   Current Allergies (reviewed today): No known allergies   Past Medical History:    Reviewed history from 09/17/2006 and no changes required:       Prostate cancer, hx of       Hyperlipidemia       Hypertension       Osteoarthritis       Cerebrovascular accident, hx of     Review of Systems      See HPI   Physical Exam  General:     alert, well-developed, well-nourished, and well-hydrated.  Wrapped in blanket, shivering Head:     normocephalic.   Eyes:     glasses, L eye eryth, lids edematus Ears:     R ear normal and L ear normal.   Nose:     eryth, edema Mouth:     injected Lungs:     normal respiratory effort and normal breath sounds.   Abdomen:     soft, non-tender, no guarding, no abdominal hernia, no inguinal hernia, no hepatomegaly, and no splenomegaly.  active bowel sounds, no rushes or tinkles Neurologic:     alert & oriented X3 and gait slow and deliberate   Skin:     turgor normal and color normal.   Psych:     normally interactive.      Impression & Recommendations:  Problem # 1:  VIRAL INFECTION, ACUTE (ICD-079.99)  Orders: T-CBC w/Diff (85025-10010)--notify in AM--see back as needed Venipuncture (11914)   Medications Added to Medication List This Visit: 1)  Pred Forte 1 % Susp (Prednisolone acetate) .[name].. 1 qtt qid 2)  Zymar 0.3 % Soln (Gatifloxacin) .[name].. 1 qtt  qid   Patient Instructions: 1)  clear liquids and bland diet x 1-2 days, then increase as tolerates 2)  Tylenol ES 2 q4-6hprn fever/aches 3)  call him with lab results--see back if needed

## 2010-05-22 NOTE — Assessment & Plan Note (Signed)
Summary: FOLLOW UP   Vital Signs:  Patient Profile:   77 Years Old Male Weight:      200 pounds Temp:     96.8 degrees F oral Pulse rate:   72 / minute Pulse rhythm:   regular Resp:     18 per minute BP sitting:   130 / 80  (left arm) Cuff size:   large  Vitals Entered By: Providence Crosby (January 15, 2008 8:40 AM)                 Chief Complaint:  followup visit.  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  No trouble urinating Nocturia x 2 Some stable urgency but nothing new  No chest pain No SOB No edema No palpitations    Prior Medications Reviewed Using: List Brought by Patient  Current Allergies: No known allergies   Past Medical History:    Reviewed history from 07/15/2007 and no changes required:       Prostate cancer, hx of------------------------------------------Dr Evelene Croon       Hyperlipidemia       Hypertension       Osteoarthritis       Cerebrovascular accident, hx of       Impaired fasting glucose--5/07       Detached retina--5/08 Right                Past Surgical History:    Reviewed history from 10/08/2006 and no changes required:       CVA- (1994)       LIH (2002-2003)       Pneumonia (2005)       Pneumonia (03/2006)       Prostatectomy- total (2002)--  Evelene Croon   Social History:    Reviewed history from 07/15/2007 and no changes required:       Marital Status: Married       Children: 1 adopted son       Occupation: retired       Never Smoked       Chewed till 2006       Alcohol use-no              Has living will       Wife is health care POA       would accept CPR but no prolonged machines. Not sure about G-tube       (discussed 10/29/05)    Review of Systems       sleeps okay unless pain is bad Tramadol does tend to keep him up  Weght down 4# since last visit Stays mildly nervous---no real change not depressed right eye --can see some--better than before. Left eye is fine   Physical Exam  General:     alert and normal appearance.   Neck:     supple, no masses, no thyromegaly, no carotid bruits, and no cervical lymphadenopathy.   Lungs:     normal respiratory effort and normal breath sounds.   Heart:     normal rate, regular rhythm, no murmur, and no gallop.   Abdomen:     soft and non-tender.   Pulses:     1+ in pulses Extremities:     no edema Neurologic:     alert & oriented X3 and strength  normal in all extremities.   Skin:     no rashes and no suspicious lesions.   Psych:     normally interactive, good eye contact, not anxious appearing, and not depressed appearing.      Impression & Recommendations:  Problem # 1:  HYPERTENSION (ICD-401.9) Assessment: Unchanged good control no changes  His updated medication list for this problem includes:    Hyzaar 50-12.5 Mg Tabs (Losartan potassium-hctz) .[name]... 1 daily by mouth  BP today: 130/80 Prior BP: 128/76 (07/15/2007)  Labs Reviewed: Creat: 1.3 (07/15/2007) Chol: 169 (07/15/2007)   HDL: 49.7 (07/15/2007)   LDL: 106 (07/15/2007)   TG: 67 (07/15/2007)  Orders: Venipuncture (19622) TLB-Renal Function Panel (80069-RENAL)   Problem # 2:  HYPERLIPIDEMIA (ICD-272.4) Assessment: Unchanged doing well no side effects  His updated medication list for this problem includes:    Simvastatin 20 Mg Tabs (Simvastatin) .[name]... 1 at bedtime  Labs Reviewed: Chol: 169 (07/15/2007)   HDL: 49.7 (07/15/2007)   LDL: 106 (07/15/2007)   TG: 67 (07/15/2007) SGOT: 22 (07/15/2007)   SGPT: 17 (07/15/2007)  Orders: TLB-Lipid Panel (80061-LIPID) TLB-Hepatic/Liver Function Pnl (80076-HEPATIC)   Problem # 3:  OSTEOARTHRITIS (ICD-715.90) Assessment: Unchanged does okay with the tramadol and tylenol  His updated medication list for this problem includes:     Tramadol Hcl 50 Mg Tabs (Tramadol hcl) .[name]... 1 three times a day as needed for arthritis pain   Problem # 4:  PROSTATE CANCER, HX OF (ICD-V10.46) Assessment: Unchanged will check PSA next time was basically undetectable  Complete Medication List: 1)  Plavix 75 Mg Tabs (Clopidogrel bisulfate) .[name].. 1 daily by mouth 2)  Simvastatin 20 Mg Tabs (Simvastatin) .[name].. 1 at bedtime 3)  Hyzaar 50-12.5 Mg Tabs (Losartan potassium-hctz) .[name].. 1 daily by mouth 4)  Fish Oil Oil (Fish oil) .[name].. 1 spoonful daily 5)  Vitamin B-12 100 Mcg Tabs (Cyanocobalamin) .[name].. 1 daily 6)  Tramadol Hcl 50 Mg Tabs (Tramadol hcl) .[name].. 1 three times a day as needed for arthritis pain   Patient Instructions: 1)  Please schedule a follow-up appointment in 6 months.   Prescriptions: TRAMADOL HCL 50 MG TABS (TRAMADOL HCL) 1 three times a day as needed for arthritis pain  #270 x 0   Entered by:   Providence Crosby   Authorized by:   Cindee Salt MD   Signed by:   Providence Crosby on 01/15/2008   Method used:   Print then Give to Patient   RxID:   [id number] HYZAAR 50-12.5 MG TABS (LOSARTAN POTASSIUM-HCTZ) 1 daily by mouth  #90 x 3   Entered by:   Providence Crosby   Authorized by:   Cindee Salt MD   Signed by:   Providence Crosby on 01/15/2008   Method used:   Print then Give to Patient   RxID:   [id number] SIMVASTATIN 20 MG TABS (SIMVASTATIN) 1 at bedtime  #90 x 3   Entered by:   Providence Crosby   Authorized by:   Cindee Salt MD   Signed by:   Providence Crosby on 01/15/2008   Method used:   Print then Give to Patient   RxID:   [id number] PLAVIX 75 MG TABS (CLOPIDOGREL BISULFATE) 1 daily by mouth  #90 x 3   Entered by:   Providence Crosby   Authorized by:   Cindee Salt MD   Signed by:   Providence Crosby on 01/15/2008   Method used:   Print then Give to Patient  RxID:   [id number]  [email or web address]  Appended Document: FOLLOW UP    Clinical Lists Changes  Orders: Added new Service order of Influenza Vaccine  MCR [phone]) - Signed Observations: Added new observation of FLU VAX#1VIS: 11/13/06 version given January 15, 2008. (01/15/2008 9:26) Added new observation of FLU VAXLOT: [id number] (01/15/2008 9:26) Added new observation of FLU VAX EXP: 10/19/2008 (01/15/2008 9:26) Added new observation of FLU VAXBY: Wanda Combs (01/15/2008 9:26) Added new observation of FLU VAXRTE: IM (01/15/2008 9:26) Added new observation of FLU VAX DSE: 0.5 ml (01/15/2008 9:26) Added new observation of FLU VAXMFR: [employer] (01/15/2008 9:26) Added new observation of FLU VAX SITE: left deltoid (01/15/2008 9:26) Added new observation of FLU VAX: Fluvax MCR (01/15/2008 9:26)       Influenza Vaccine (to be given today)    Influenza Vaccine    Vaccine Type: Fluvax MCR    Site: left deltoid    Mfr: Gl[employer]oSmithKline    Dose: 0.5 ml    Route: IM    Given by: Providence Crosby    Exp. Date: 10/19/2008    Lot #: [id number]    VIS given: 11/13/06 version given January 15, 2008.  Flu Vaccine Consent Questions    Do you have a history of severe allergic reactions to this vaccine? no    Any prior history of allergic reactions to egg and/or gelatin? no    Do you have a sensitivity to the preservative Thimersol? no    Do you have a past history of Guillan-Barre Syndrome? no    Do you currently have an acute febrile illness? no    Have you ever had a severe reaction to latex? no    Vaccine information given and explained to patient? yes

## 2010-05-22 NOTE — Assessment & Plan Note (Signed)
Summary: FOLLOW UP   Vital Signs:  Patient Profile:   77 Years Old Male Weight:      204.25 pounds Temp:     97.5 degrees F oral Pulse rate:   64 / minute BP sitting:   128 / 76  (left arm)  Vitals Entered By: Wandra Mannan (July 15, 2007 8:44 AM)                 Chief Complaint:  follow up.  History of Present Illness: 3 surgeries on right  eye--very blurry Fortunately left eye is fine  Still with arthritis aching Wrist, knees and shoulders Esp bad with activity and damp weather Tylenol and aleve provide very little help Tramadol helps--tries to limit  Occ checks BP  runnnig good------160/70's No sig headaches though occ right side head discomfort he relates to the surgery  No muscle aches or stomach upset on chol med No weakness, speech changes or signs of CVA      Current Allergies (reviewed today): No known allergies   Past Medical History:    Reviewed history from 01/15/2007 and no changes required:       Prostate cancer, hx of------------------------------------------Dr Evelene Croon       Hyperlipidemia       Hypertension       Osteoarthritis       Cerebrovascular accident, hx of       Impaired fasting glucose--5/07       Detached retina--5/08 Right                Past Surgical History:    Reviewed history from 10/08/2006 and no changes required:       CVA- (1994)       LIH (2002-2003)       Pneumonia (2005)       Pneumonia (03/2006)       Prostatectomy- total (2002)--  Evelene Croon   Family History:    Father: deceased age 40- MI    Mother: deceased age 70- MI    Siblings: 5 brothers- 3 of who are deceased, 3 sisters    CAD in parents, 2 brothers died of MI    Prostate cancer in 1 brother    No DM    Melanoma in 1 brohter  Social History:    Marital Status: Married    Children: 1 adopted son    Occupation: retired    Never Smoked    Chewed till 2006    Alcohol use-no        Has living will    Wife is health care POA    would accept CPR  but no prolonged machines. Not sure about G-tube    (discussed 10/29/05)   Risk Factors:  Tobacco use:  never Alcohol use:  no  Colonoscopy History:     Date of Last Colonoscopy:  04/22/2005    Results:  Not sure of exact date Polyps once Last exam negative   Review of Systems  The patient denies chest pain, syncope, dyspnea on exhertion, abdominal pain, melena, and hematochezia.         Bowels have been fine Some trouble with nerves--feels it is related to anaesthesia   Physical Exam  General:     alert and normal appearance.   Neck:     supple, no masses, no thyromegaly, no carotid bruits, and no cervical lymphadenopathy.   Lungs:     normal respiratory effort and normal breath sounds.   Heart:  normal rate, regular rhythm, no murmur, and no gallop.   Abdomen:     soft and non-tender.   Msk:     no joint tenderness and no joint swelling.   Pulses:     1+ in each foot Extremities:     no edema or calf tenderness Psych:     normally interactive, good eye contact, not anxious appearing, and not depressed appearing.      Impression & Recommendations:  Problem # 1:  HYPERTENSION (ICD-401.9) Assessment: Unchanged good control will review labs  His updated medication list for this problem includes:    Hyzaar 50-12.5 Mg Tabs (Losartan potassium-hctz) .[name]... 1 daily  BP today: 128/76 Prior BP: 132/78 (01/15/2007)  Labs Reviewed: Creat: 1.2 (01/15/2007) Chol: 170 (01/15/2007)   HDL: 55.9 (01/15/2007)   LDL: 96 (01/15/2007)   TG: 91 (01/15/2007)  Orders: TLB-Renal Function Panel (80069-RENAL) TLB-CBC Platelet - w/Differential (85025-CBCD)   Problem # 2:  HYPERLIPIDEMIA (ICD-272.4) Assessment: Unchanged Rx to reduce cerebrovascular risk esp  His updated medication list for this problem includes:    Simvastatin 20 Mg Tabs (Simvastatin) .[name]... 1 at bedtime  Labs Reviewed: Chol: 170 (01/15/2007)   HDL: 55.9 (01/15/2007)   LDL: 96 (01/15/2007)   TG: 91  (01/15/2007) SGPT: 16 (01/15/2007)  Orders: Venipuncture (16109) TLB-Lipid Panel (80061-LIPID) TLB-Hepatic/Liver Function Pnl (80076-HEPATIC)   Problem # 3:  IMPAIRED FASTING GLUCOSE (ICD-790.21) Assessment: Comment Only will check labs  Problem # 4:  OSTEOARTHRITIS (ICD-715.90) Assessment: Unchanged does okay with the tramadol  His updated medication list for this problem includes:    Tramadol Hcl 50 Mg Tabs (Tramadol hcl) .[name]... 1 three times a day as needed for arthritis pain   Problem # 5:  PROSTATE CANCER, HX OF (ICD-V10.46) Assessment: Unchanged will check PSA Orders: TLB-PSA (Prostate Specific Antigen) (84153-PSA)   Complete Medication List: 1)  Plavix 75 Mg Tabs (Clopidogrel bisulfate) .[name].. 1 daily 2)  Simvastatin 20 Mg Tabs (Simvastatin) .[name].. 1 at bedtime 3)  Hyzaar 50-12.5 Mg Tabs (Losartan potassium-hctz) .[name].. 1 daily 4)  Fish Oil Oil (Fish oil) .[name].. 1 spoonful daily 5)  Vitamin B-12 100 Mcg Tabs (Cyanocobalamin) .[name].. 1 daily 6)  Tramadol Hcl 50 Mg Tabs (Tramadol hcl) .[name].. 1 three times a day as needed for arthritis pain   Patient Instructions: 1)  Please schedule a follow-up appointment in 6 months.    Prescriptions: TRAMADOL HCL 50 MG TABS (TRAMADOL HCL) 1 three times a day as needed for arthritis pain  #270 x 0   Entered and Authorized by:   Cindee Salt MD   Signed by:   Cindee Salt MD on 07/15/2007   Method used:   Print then Give to Patient   RxID:   [id number]  ] Current Allergies (reviewed today): No known allergies  Current Medications (including changes made in today's visit):  PLAVIX 75 MG TABS (CLOPIDOGREL BISULFATE) 1 daily SIMVASTATIN 20 MG TABS (SIMVASTATIN) 1 at bedtime HYZAAR 50-12.5 MG TABS (LOSARTAN POTASSIUM-HCTZ) 1 daily FISH OIL  OIL (FISH OIL) 1 spoonful daily VITAMIN B-12 100 MCG TABS (CYANOCOBALAMIN) 1 daily TRAMADOL HCL 50 MG TABS (TRAMADOL HCL) 1 three times a day as needed for arthritis pain    Colonoscopy  Procedure date:  04/22/2005  Findings:      Not sure of exact date Polyps once Last exam negative   Appended Document: FOLLOW UP copy to Dr Artis Flock  Appended Document: FOLLOW UP Office Note faxed to [facility].

## 2010-05-22 NOTE — Therapy (Signed)
Summary: Dr.Loeffelholz,[facility]  Dr.Decock,[facility]   Imported By: Beau Fanny 03/24/2008 15:00:52  _____________________________________________________________________  External Attachment:    Type:   Image     Comment:   External Document

## 2010-05-22 NOTE — Assessment & Plan Note (Signed)
Summary: 6 m f/u  dlo   Vital Signs:  Patient profile:   77 year old male Height:      68 inches Weight:      203 pounds BMI:     30.98 Temp:     98.3 degrees F oral Pulse rate:   70 / minute Pulse rhythm:   regular BP sitting:   128 / 80  (left arm) Cuff size:   large  Vitals Entered By: Mervin Hack CMA (July 15, 2008 8:56 AM)  History of Present Illness: CC: 6 month follow-up  doing better now walking is much better Has been going to therapy Has known cervical disk disease Therapy helping legs MRI/MRA not indicative of new stroke Dr Antoine Poche told him to stop the statin He did notice an improvement several days later--but he had just started therapy then  Has been needing more tramadol due to arthritic pain up to 3 per day Occ doesn't need any  wonders about effectiveness of plavix has been on since stroke many years ago discussed unclear indications  Off statin Now changed to fish oil capsule will stay with this  Allergies: No Known Drug Allergies  Past History:  Social History:    Marital Status: Married    Children: 1 adopted son    Occupation: retired    Never Smoked    Chewed till 2006    Alcohol use-no    Has living will    Wife is health care POA    would accept CPR but no prolonged machines. Not sure about G-tube    (discussed 10/29/05) (07/15/2007)  Past medical, surgical, family and social histories (including risk factors) reviewed for relevance to current acute and chronic problems.  Past Medical History:    Reviewed history from 07/15/2007 and no changes required:    Prostate cancer, hx of------------------------------------------Dr Evelene Croon    Hyperlipidemia    Hypertension    Osteoarthritis    Cerebrovascular accident, hx of    Impaired fasting glucose--5/07    Detached retina--5/08 Right  Past Surgical History:    Reviewed history from 10/08/2006 and no changes required:    CVA- (1994)    LIH (2002-2003)    Pneumonia (2005)    Pneumonia (03/2006)    Prostatectomy- total (2002)--  Evelene Croon  Family History:    Reviewed history from 07/15/2007 and no changes required:       Father: deceased age 38- MI       Mother: deceased age 35- MI       Siblings: 5 brothers- 3 of who are deceased, 3 sisters       CAD in parents, 2 brothers died of MI       Prostate cancer in 1 brother       No DM       Melanoma in 1 brohter  Social History:    Reviewed history from 07/15/2007 and no changes required:       Marital Status: Married       Children: 1 adopted son       Occupation: retired       Never Smoked       Chewed till 2006       Alcohol use-no              Has living will       Wife is health care POA       would accept CPR but no prolonged machines. Not sure about G-tube       (  discussed 10/29/05)  Review of Systems       Not sleeping well---sleeps for 2 hours then wakes Some anxiety of late No depression appeitite is fine weight stable  Physical Exam  General:  alert and normal appearance.   Neck:  supple, no masses, no thyromegaly, no carotid bruits, and no cervical lymphadenopathy.   Lungs:  normal respiratory effort and normal breath sounds.   Heart:  normal rate, regular rhythm, no murmur, and no gallop.   Extremities:  no edema Neurologic:  alert & oriented X3 and strength normal in all extremities.   Gait sitll slightly wide based but not ataxic Psych:  normally interactive, good eye contact, not anxious appearing, and not depressed appearing.     Impression & Recommendations:  Problem # 1:  CEREBELLAR INFARCT (ICD-436) Assessment Comment Only now better no clear new stroke discussed options--will continue just the aspirin  Problem # 2:  HYPERTENSION (ICD-401.9) Assessment: Unchanged  good control no changes needed  His updated medication list for this problem includes:    Hyzaar 50-12.5 Mg Tabs (Losartan potassium-hctz) .[name]... 1 daily by mouth  BP today: 128/80 Prior BP: 120/88  (05/03/2008)  Labs Reviewed: K+: 3.8 (01/15/2008) Creat: : 1.4 (01/15/2008)   Chol: 158 (01/15/2008)   HDL: 51.1 (01/15/2008)   LDL: 87 (01/15/2008)   TG: 98 (01/15/2008)  Orders: TLB-BMP (Basic Metabolic Panel-BMET) (80048-METABOL) TLB-CBC Platelet - w/Differential (85025-CBCD) Venipuncture (16109) TLB-TSH (Thyroid Stimulating Hormone) (84443-TSH)  Problem # 3:  OSTEOARTHRITIS (ICD-715.90) Assessment: Deteriorated increased pain lately but is controlled with the tramadol  His updated medication list for this problem includes:    Tramadol Hcl 50 Mg Tabs (Tramadol hcl) .[name]... 1 three times a day as needed for arthritis pain    Aspirin Ec 81 Mg Tbec (Aspirin) .[name]... 1 daily  Problem # 4:  HYPERLIPIDEMIA (ICD-272.4) Assessment: Comment Only  now off statin may have caused muscle symptoms will consider trial again at next viisit  The following medications were removed from the medication list:    Simvastatin 20 Mg Tabs (Simvastatin) .[name]... 1 at bedtime  Labs Reviewed: SGOT: 19 (01/15/2008)   SGPT: 12 (01/15/2008)   HDL:51.1 (01/15/2008), 49.7 (07/15/2007)  LDL:87 (01/15/2008), 106 (60/45/4098)  Chol:158 (01/15/2008), 169 (07/15/2007)  Trig:98 (01/15/2008), 67 (07/15/2007)  Orders: TLB-Lipid Panel (80061-LIPID) TLB-Hepatic/Liver Function Pnl (80076-HEPATIC)  Complete Medication List: 1)  Hyzaar 50-12.5 Mg Tabs (Losartan potassium-hctz) .[name].. 1 daily by mouth 2)  Fish Oil Oil (Fish oil) .[name].. 1 spoonful daily 3)  Vitamin B-12 100 Mcg Tabs (Cyanocobalamin) .[name].. 1 daily 4)  Tramadol Hcl 50 Mg Tabs (Tramadol hcl) .[name].. 1 three times a day as needed for arthritis pain 5)  Aspirin Ec 81 Mg Tbec (Aspirin) .[name].. 1 daily  Patient Instructions: 1)  Please schedule a follow-up appointment in 4 months .  Prescriptions: TRAMADOL HCL 50 MG TABS (TRAMADOL HCL) 1 three times a day as needed for arthritis pain  #270 x 1   Entered and Authorized by:   Cindee Salt MD   Signed by:   Cindee Salt MD on 07/15/2008   Method used:   Print then Give to Patient   RxID:   [id number]       Current Allergies (reviewed today): No known allergies

## 2010-05-22 NOTE — Consult Note (Signed)
Summary: Consultation Report  Consultation Report   Imported By: Eleonore Chiquito 04/05/2008 15:28:49  _____________________________________________________________________  External Attachment:    Type:   Image     Comment:   External Document  Appended Document: Consultation Report sensorineural hearing loss ??CVA checking carotids and MRI

## 2010-05-24 NOTE — Assessment & Plan Note (Signed)
Summary: 8:00 APPT 6 MONTH FOLLOW UP/[facility]   Vital Signs:  Patient profile:   77 year old male Weight:      204 pounds Temp:     97.8 degrees F oral Pulse rate:   68 / minute Pulse rhythm:   regular BP sitting:   140 / 70  (left arm) Cuff size:   large  Vitals Entered By: Mervin Hack CMA Duncan Dull) (April 10, 2010 8:04 AM) CC: 6 month follow-up   History of Present Illness: Doing okay Has noted some pain in right shoulder to neck--may have slept wrong Ongoing arthritis pain---worst in wrists, hands and knees tramadol hleps---maximum 3 per day  Ongoing balance issues Has to be careful walking---doesn't use cane though he should when he is out No recent falls but he has come close Still on aspirin  Was on zocor in past had to stop due to myalgias not excited about usning another  No chest pain Chronic DOE--no sig change No edema  No sig headaches  Allergies (verified): 1)  Simvastatin (Simvastatin)  Past History:  Past medical, surgical, family and social histories (including risk factors) reviewed for relevance to current acute and chronic problems.  Past Medical History: Reviewed history from 07/15/2007 and no changes required. Prostate cancer, hx of------------------------------------------Dr Evelene Croon Hyperlipidemia Hypertension Osteoarthritis Cerebrovascular accident, hx of Impaired fasting glucose--5/07 Detached retina--5/08 Right  Past Surgical History: Reviewed history from 10/08/2006 and no changes required. CVA- (1994) LIH (2002-2003) Pneumonia (2005) Pneumonia (03/2006) Prostatectomy- total (2002)--  Evelene Croon  Family History: Reviewed history from 07/15/2007 and no changes required. Father: deceased age 41- MI Mother: deceased age 41- MI Siblings: 5 brothers- 3 of who are deceased, 3 sisters CAD in parents, 2 brothers died of MI Prostate cancer in 1 brother No DM Melanoma in 1 brohter  Social History: Reviewed history from 07/15/2007 and no  changes required. Marital Status: Married Children: 1 adopted son Occupation: retired Never Smoked Chewed till 2006 Alcohol use-no  Has living will Wife is health care POA would accept CPR but no prolonged machines. Not sure about G-tube (discussed 10/29/05)  Review of Systems       appetite is fine weight is down 5# sleeps in spurts---about 3 hours at a time, then goes back. No sig daytime somnolence  Physical Exam  General:  alert and normal appearance.   Neck:  supple, no masses, no thyromegaly, no carotid bruits, and no cervical lymphadenopathy.   Lungs:  normal respiratory effort, no intercostal retractions, no accessory muscle use, and normal breath sounds.   Heart:  normal rate, regular rhythm, no murmur, and no gallop.   Abdomen:  soft and non-tender.   Msk:  no joint tenderness and no joint swelling.   Extremities:  no edema Neurologic:  alert & oriented X3, strength normal in all extremities, and gait normal.   Psych:  normally interactive, good eye contact, not anxious appearing, and not depressed appearing.     Impression & Recommendations:  Problem # 1:  CEREBELLAR INFARCT (ICD-436) Assessment Unchanged stable mild balance issues counselled about appropriate use of cane or walking stick  Problem # 2:  HYPERTENSION (ICD-401.9) Assessment: Unchanged  reasonable control no changes due for labs  His updated medication list for this problem includes:    Hyzaar 50-12.5 Mg Tabs (Losartan potassium-hctz) .[name]... 1 daily by mouth  BP today: 140/70 Prior BP: 134/74 (02/26/2010)  Labs Reviewed: K+: 4.3 (08/21/2009) Creat: : 1.2 (08/21/2009)   Chol: 197 (04/11/2009)   HDL: 51.80 (04/11/2009)  LDL: 125 (04/11/2009)   TG: 102.0 (04/11/2009)  Orders: TLB-Renal Function Panel (80069-RENAL) TLB-CBC Platelet - w/Differential (85025-CBCD) TLB-TSH (Thyroid Stimulating Hormone) (84443-TSH) Venipuncture (16109)  Problem # 3:  HYPERLIPIDEMIA (ICD-272.4)  on fish  oil and red yeast rice statin myalgias in past  Orders: TLB-Lipid Panel (80061-LIPID) TLB-Hepatic/Liver Function Pnl (80076-HEPATIC)  Problem # 4:  OSTEOARTHRITIS (ICD-715.90) Assessment: Unchanged does okay with the tramadol  His updated medication list for this problem includes:    Tramadol Hcl 50 Mg Tabs (Tramadol hcl) .[name]... 1 three times a day as needed for arthritis pain    Aspirin Ec 81 Mg Tbec (Aspirin) .[name]... 1 daily  Complete Medication List: 1)  Hyzaar 50-12.5 Mg Tabs (Losartan potassium-hctz) .[name].. 1 daily by mouth 2)  Tramadol Hcl 50 Mg Tabs (Tramadol hcl) .[name].. 1 three times a day as needed for arthritis pain 3)  Fish Oil Oil (Fish oil) .[name].. 1 spoonful daily 4)  Vitamin B-12 100 Mcg Tabs (Cyanocobalamin) .[name].. 1 daily 5)  Aspirin Ec 81 Mg Tbec (Aspirin) .[name].. 1 daily 6)  Red Yeast Rice 600 Mg Caps (Red yeast rice extract) .... 2 tabs daily 7)  Vitamin D 1000 Unit Tabs (Cholecalciferol) .[name].. 1 tab daily  Patient Instructions: 1)  Please schedule a follow-up appointment in 6 months for Medicare Wellness exam Prescriptions: TRAMADOL HCL 50 MG TABS (TRAMADOL HCL) 1 three times a day as needed for arthritis pain  #270 x 1   Entered by:   Mervin Hack CMA (AAMA)   Authorized by:   Cindee Salt MD   Signed by:   Mervin Hack CMA (AAMA) on 04/10/2010   Method used:   Print then Give to Patient   RxID:   [id number] TRAMADOL HCL 50 MG TABS (TRAMADOL HCL) 1 three times a day as needed for arthritis pain  #270 x 3   Entered by:   Mervin Hack CMA (AAMA)   Authorized by:   Cindee Salt MD   Signed by:   Mervin Hack CMA (AAMA) on 04/10/2010   Method used:   Print then Give to Patient   RxID:   [id number]    Orders Added: 1)  Est. Patient Level IV [29528] 2)  TLB-Renal Function Panel [80069-RENAL] 3)  TLB-CBC Platelet - w/Differential [85025-CBCD] 4)  TLB-TSH (Thyroid Stimulating Hormone) [84443-TSH] 5)  Venipuncture [36415] 6)  TLB-Lipid  Panel [80061-LIPID] 7)  TLB-Hepatic/Liver Function Pnl [80076-HEPATIC]    Current Allergies (reviewed today): SIMVASTATIN (SIMVASTATIN)

## 2010-09-04 NOTE — Op Note (Signed)
[name], [name]                 ACCOUNT NO.:  [id number]   MEDICAL RECORD NO.:  [id number]          PATIENT TYPE:  AMB   LOCATION:  SDS                          FACILITY:  [facility]   PHYSICIAN:  John D. [name], M.D. 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   PROCEDURE:  Scleral buckle right eye, gas injection right eye, laser  photocoagulation right eye.   SURGEON:  Beulah Gandy. [name], M.D.   ASSISTANT:  Rosalie Doctor, MA   ANESTHESIA:  General.   DETAILS:  Usual prep and drape, 360 degree limbal peritomy, isolation of  four rectus muscles on 2-0 silk.  Scleral dissection for 360 degrees to  admit a number 279 intrascleral implant.  Diathermy placed in the bed.  A 508G radial segment was fashioned to fit beneath the buckle at 4  o'clock and to support the break.  A perforation site was chosen at 12  o'clock.  A large amount of clear colorless subretinal fluid came forth.  The 279 implant was placed around the globe with the 240 band around the  globe and a 270 sleeve at 2 o'clock.  Scleral flaps were closed over the  scleral buckle.  Indirect ophthalmoscopy showed the retina to be lying  nicely in place on the scleral buckle with the break well supported.  Diathermy was placed in the bed. Perfluoron propane 40% 1.4 mL was  injected through the 12 o'clock pars plana to reinflate the globe.  The  indirect ophthalmoscope laser was moved into place. 1465 burns were  placed around the retinal break and on the scleral buckle for 360  degrees. The power was between 1000 and 2400 milliwatts, 1000 microns  each, and 0.1 seconds each.  The band was adjusted and trimmed, the  buckle was adjusted and trimmed.  The sutures were knotted and the free  ends removed.  The conjunctiva was reapproximated with 7-0 chromic  suture.   Polymyxin and gentamicin were irrigated into Tenon's space.  Atropine solution was applied.  Marcaine was injected around the globe  for postop pain.  TobraDex ophthalmic ointment, patch and shield were  placed.  Closing pressure was 10 with a Banker.  Complications none.  Duration 2 hours.  A patch and shield were placed.  The patient was awakened and taken to recovery in satisfactory  condition.      Beulah Gandy. [name], M.D.  Electronically Signed     JDM/MEDQ  D:  09/18/2006  T:  09/18/2006  Job:  [id number]

## 2010-09-04 NOTE — Assessment & Plan Note (Signed)
[facility]                            CARDIOLOGY OFFICE NOTE   Kyle [name], Kyle [name]                        MRN:          [id number]  DATE:06/07/2008                            DOB:          06-17-1931    REFERRING PHYSICIAN:  Levert Feinstein, MD   PRIMARY CARE PHYSICIAN:  Karie Schwalbe, MD   REASON FOR PRESENTATION:  Evaluate the patient with weakness and  dizziness.   HISTORY OF PRESENT ILLNESS:  The patient is a pleasant 77 year old  gentleman with history some 15 years ago of a hospitalization for  weakness.  He reports a workup including a cardiac cath, though I have  none of these details.  He was not told that he had any cardiac disease.  He has not had any further cardiovascular testing since then.   He does have a history of CVAs x3 according to his wife.  The first was  about 15 years ago.  Most recent, he reports was in November.  He said  he did not have any severe residual from this.  However, some weeks  after this in December, he began noticing increased weakness.  He had  problems with his gait and balance.  He had some difficulty turning his  head.  He had difficulty bending over to pick something up or he would  fall forward.  He could walk a straight line, but could not turn without  losing his balance.  He had generalized weakness more in his legs.  Over  the last several weeks, he has just been doing less and less.  He did  see Dr. Terrace Arabia of Neurology.  A carotid ultrasound had been done at  [facility] and demonstrated no evidence of significant stenosis.  He had  an MRI/MRA of the brain that showed moderate atheromatous changes of the  branch vessels at both middle and posterior cerebral arteries.  He had  some chronic involutional changes and areas of prior infarct at the  right lobe of the cerebellum.  However, an acute process was not  identified.  He has been managed with Plavix and aspirin as well as  other medicines listed  below.  He is referred here for further  evaluation.   The patient does not have any chest pressure, neck or arm discomfort.  He has some rare palpitations.  He does get dyspneic with moderate  exertion, but does not have any resting shortness of breath.  Denies any  PND or orthopnea.  He does have some mild orthostatic symptoms.  He has  not had any claudication, though he has had some mild lower extremity  swelling.   The patient has had no syncope.  He did have an ENT evaluation  apparently, which was normal.   PAST MEDICAL HISTORY:  1. Hyperlipidemia x10 years.  2. Hypertension x12 years.  3. CVAs.   PAST SURGICAL HISTORY:  1. Prostate surgery in 1996.  2. Eye surgery.  3. Umbilical hernia repair.  4. Left inguinal hernia repair.   ALLERGIES:  None.   MEDICATIONS:  1. Plavix 75 mg  daily.  2. Simvastatin 20 mg daily.  3. Hyzaar 50/12.5 daily.  4. Tramadol.  5. B12.  6. Fish oil.  7. Aspirin 81 mg daily.   SOCIAL HISTORY:  The patient is retired.  He is married.  He has one  child.  He smoked one pack per day for less than 10 years, but quit  cigarettes in the 70s.  He does not drink alcohol.   FAMILY HISTORY:  Contributory for a brother dying of myocardial  infarction at age 83.  His parents both died of heart disease at later  ages.   REVIEW OF SYSTEMS:  As stated in the HPI, positive for constipation or  reflux, and joint pains.  Negative for all other systems.   PHYSICAL EXAMINATION:  GENERAL:  The patient is pleasant and in no  distress.  VITAL SIGNS:  Blood pressure 133/84 (he did have an orthostatic drop  down to 117/75 and got dizzy.  His heart rate did slightly go up to 78),  weight 204 pounds, and body mass index 28.  HEENT:  Eyelids unremarkable; pupils are equal, round, and react to  light; fundi within normal limits, oral mucosa unremarkable.  NECK:  No jugular venous distention at 45 degrees, carotid upstroke  brisk and symmetric, no bruits, no  thyromegaly.  LYMPHATICS:  No cervical, axillary, or inguinal adenopathy.  LUNGS:  Clear to auscultation bilaterally.  BACK:  No costovertebral angle tenderness.  CHEST:  Unremarkable.  HEART:  PMI not displaced or sustained; S1 and S2 within normal limits,  no S3, no S4; no clicks, no rubs, no murmurs.  ABDOMEN:  Flat; positive bowel sounds, normal in frequency and pitch; no  bruits, no rebound, no guarding, no midline pulsatile mass; no  hepatomegaly, no splenomegaly.  SKIN:  No rashes, no nodules.  EXTREMITIES:  Pulses 2+ throughout, no edema, no cyanosis, no clubbing.  NEURO:  Oriented to person, place, and time; cranial nerves II through  XII; motor grossly intact throughout.   EKG, sinus rhythm, rate 62, left axis deviation, interventricular  conduction delay, left anterior fascicular block, no acute ST-T wave  changes.   ASSESSMENT AND PLAN:  1. Abnormal gait.  The patient has an abnormal gait, which is unlikely      to have cardiac etiology, though he does have some orthostatic      symptoms which will be addressed as described below.  He is to      follow up again with Dr. Terrace Arabia and I encouraged continuation of this.      He is to start working with PT and OT.  2. Dyspnea.  The patient does have some dyspnea.  He has multiple      cardiovascular risk factors.  Screening with stress test would be      indicated.  However, he would not be able to do an exercise      treadmill.  I would like to do then an adenosine perfusion study to      further evaluate his dyspnea in the context of an abnormal EKG and      multiple cardiovascular risk factors.  3. Orthostatic hypotension.  The patient had a mild drop in his blood      pressure.  This was associated with some dizziness.  I discussed      this with him further and consider compression stockings as a first-      line therapy.  He also needs to use avoidance.  4.  Other, I tried to call Dr. Terrace Arabia today to see what specific  questions      she might have from a cardiovascular standpoint.  I will try to      reach her again.  5. Followup will be based on the results of the above.     Rollene Rotunda, MD, [facility]  Electronically Signed    JH/MedQ  DD: 06/07/2008  DT: 06/08/2008  Job #: [id number]   cc:   Levert Feinstein, MD

## 2010-10-09 ENCOUNTER — Encounter: Payer: Self-pay | Admitting: Internal Medicine

## 2010-10-10 ENCOUNTER — Ambulatory Visit (INDEPENDENT_AMBULATORY_CARE_PROVIDER_SITE_OTHER): Payer: Medicare Other | Admitting: Internal Medicine

## 2010-10-10 ENCOUNTER — Encounter: Payer: Self-pay | Admitting: Internal Medicine

## 2010-10-10 VITALS — BP 110/62 | HR 70 | Temp 97.9°F | Ht 68.0 in | Wt 204.0 lb

## 2010-10-10 DIAGNOSIS — Z8546 Personal history of malignant neoplasm of prostate: Secondary | ICD-10-CM

## 2010-10-10 DIAGNOSIS — I6789 Other cerebrovascular disease: Secondary | ICD-10-CM

## 2010-10-10 DIAGNOSIS — M1990 Unspecified osteoarthritis, unspecified site: Secondary | ICD-10-CM

## 2010-10-10 DIAGNOSIS — I10 Essential (primary) hypertension: Secondary | ICD-10-CM

## 2010-10-10 DIAGNOSIS — R7301 Impaired fasting glucose: Secondary | ICD-10-CM

## 2010-10-10 DIAGNOSIS — Z0000 Encounter for general adult medical examination without abnormal findings: Secondary | ICD-10-CM

## 2010-10-10 DIAGNOSIS — E785 Hyperlipidemia, unspecified: Secondary | ICD-10-CM

## 2010-10-10 LAB — CBC WITH DIFFERENTIAL/PLATELET
Basophils Absolute: 0 10*3/uL (ref 0.0–0.1)
Basophils Relative: 0.2 % (ref 0.0–3.0)
Eosinophils Absolute: 0.3 10*3/uL (ref 0.0–0.7)
Eosinophils Relative: 2.8 % (ref 0.0–5.0)
HCT: 47.5 % (ref 39.0–52.0)
Hemoglobin: 16 g/dL (ref 13.0–17.0)
Lymphocytes Relative: 34.9 % (ref 12.0–46.0)
Lymphs Abs: 3.6 10*3/uL (ref 0.7–4.0)
MCHC: 33.7 g/dL (ref 30.0–36.0)
MCV: 94.2 fl (ref 78.0–100.0)
Monocytes Absolute: 0.8 10*3/uL (ref 0.1–1.0)
Monocytes Relative: 8 % (ref 3.0–12.0)
Neutro Abs: 5.5 10*3/uL (ref 1.4–7.7)
Neutrophils Relative %: 54.1 % (ref 43.0–77.0)
Platelets: 198 10*3/uL (ref 150.0–400.0)
RBC: 5.04 Mil/uL (ref 4.22–5.81)
RDW: 15.1 % — ABNORMAL HIGH (ref 11.5–14.6)
WBC: 10.2 10*3/uL (ref 4.5–10.5)

## 2010-10-10 LAB — HEPATIC FUNCTION PANEL
ALT: 15 U/L (ref 0–53)
AST: 17 U/L (ref 0–37)
Albumin: 4 g/dL (ref 3.5–5.2)
Alkaline Phosphatase: 65 U/L (ref 39–117)
Bilirubin, Direct: 0.1 mg/dL (ref 0.0–0.3)
Total Bilirubin: 0.5 mg/dL (ref 0.3–1.2)
Total Protein: 7.3 g/dL (ref 6.0–8.3)

## 2010-10-10 LAB — BASIC METABOLIC PANEL
BUN: 16 mg/dL (ref 6–23)
CO2: 30 mEq/L (ref 19–32)
Calcium: 9.6 mg/dL (ref 8.4–10.5)
Chloride: 105 mEq/L (ref 96–112)
Creatinine, Ser: 1.3 mg/dL (ref 0.4–1.5)
GFR: 55.36 mL/min — ABNORMAL LOW (ref >60.00)
Glucose, Bld: 85 mg/dL (ref 70–99)
Potassium: 4.7 mEq/L (ref 3.5–5.1)
Sodium: 141 mEq/L (ref 135–145)

## 2010-10-10 LAB — LIPID PANEL
Cholesterol: 237 mg/dL — ABNORMAL HIGH (ref 0–200)
HDL: 52.9 mg/dL (ref >39.00)
Total CHOL/HDL Ratio: 4
Triglycerides: 182 mg/dL — ABNORMAL HIGH (ref 0.0–149.0)
VLDL: 36.4 mg/dL (ref 0.0–40.0)

## 2010-10-10 LAB — TSH: TSH: 2.7 u[IU]/mL (ref 0.35–5.50)

## 2010-10-10 LAB — HEMOGLOBIN A1C: Hgb A1c MFr Bld: 6.7 % — ABNORMAL HIGH (ref 4.6–6.5)

## 2010-10-10 LAB — LDL CHOLESTEROL, DIRECT: Direct LDL: 164.4 mg/dL

## 2010-10-10 LAB — PSA: PSA: 0 ng/mL — ABNORMAL LOW (ref 0.10–4.00)

## 2010-10-10 MED ORDER — LOSARTAN POTASSIUM-HCTZ 50-12.5 MG PO TABS: 1.0000 | ORAL_TABLET | Freq: Every day | ORAL | Status: DC

## 2010-10-10 MED ORDER — TRAMADOL HCL 50 MG PO TABS: 50.0000 mg | ORAL_TABLET | Freq: Four times a day (QID) | ORAL | Status: DC | PRN

## 2010-10-10 NOTE — Patient Instructions (Addendum)
Please try loratadine 10mg  1-2 daily, cetirizine 10mg  daily or fexofenadine 180mg  daily as needed for your allergies. Please get the shingles vaccine (zostavax) at your pharmacy

## 2010-10-10 NOTE — Assessment & Plan Note (Signed)
BP Readings from Last 3 Encounters:  10/10/10 110/62  04/10/10 140/70  02/26/10 134/74   Good control Will check labs again

## 2010-10-10 NOTE — Assessment & Plan Note (Signed)
Uses the tramadol regularly 

## 2010-10-10 NOTE — Assessment & Plan Note (Signed)
Discussed  Will recheck PSA

## 2010-10-10 NOTE — Assessment & Plan Note (Signed)
Mild stability issues only Good BP control and on red yeast rice and fish oil Daily aspirin

## 2010-10-10 NOTE — Assessment & Plan Note (Addendum)
Lab Results  Component Value Date   LDLCALC 125* 04/11/2009   Reasonable control after stroke On red yeast rice Would hold off on statin--esp with memory issues

## 2010-10-10 NOTE — Assessment & Plan Note (Addendum)
I have personally reviewed the Medicare Annual Wellness questionnaire and have noted 1. The patient's medical and social history 2. Their use of alcohol, tobacco or illicit drugs 3. Their current medications and supplements 4. The patient's functional ability including ADL's, fall risks, home safety risks and hearing or visual             impairment. 5. Diet and physical activities 6. Evidence for depression or mood disorders  The patients weight, height, BMI and visual acuity have been recorded in the chart I have made referrals, counseling and provided education to the patient based review of the above and I have provided the pt with a written personalized care plan for preventive services.  I have provided you with a copy of your personalized plan for preventive services. Please take the time to review along with your updated medication list.  Gave Rx for zostavax He is careful to reduce risk of falls since stroke Discussed mild cognitive issues---will review with wife but he thinks her memory is worse than his

## 2010-10-10 NOTE — Progress Notes (Signed)
Subjective:    Patient ID: Kyle [name], male    DOB: [birth year], 77 y.o.   MRN: [id number]  HPI Here for follow up and annual Medicare wellness visit Form reviewed  History of prostate cancer about 6 years ago Hasn't been checked for some time UTD on colonoscopy  Did have fall in past year Gets some dizziness if he bends over and gets up quick He tries to be careful  Having some allergy issues in spring and early summer Discussed OTC meds  No chest pain No SOB No regular exercise---had been walking but limited due to pain No ankle swelling  Uses tramadol and tylenol up to 3 times a day Ongoing knee, hand and wrist pain  Current Outpatient Prescriptions on File Prior to Visit  Medication Sig Dispense Refill  . aspirin 81 MG tablet Take 81 mg by mouth daily.        . Cholecalciferol (VITAMIN D) 1000 UNITS capsule Take 1,000 Units by mouth daily.        . Fish Oil OIL Take 15 mLs by mouth daily.        . Red Yeast Rice 600 MG CAPS Take by mouth daily.        . traMADol (ULTRAM) 50 MG tablet Take 50 mg by mouth every 6 (six) hours as needed.        . vitamin B-12 (CYANOCOBALAMIN) 100 MCG tablet Take 50 mcg by mouth daily.        [name] DISCONTD: losartan-hydrochlorothiazide (HYZAAR) 50-12.5 MG per tablet Take 1 tablet by mouth daily.         Past Medical History  Diagnosis Date  . History of prostate cancer   . Hyperlipidemia   . Hypertension   . Arthritis   . Stroke   . Impaired fasting glucose   . Detached retina     right    Past Surgical History  Procedure Date  . Hernia repair   . Prostate surgery     Family History  Problem Relation Age of Onset  . Heart disease Mother   . Coronary artery disease Mother   . Heart disease Father   . Coronary artery disease Father   . Heart disease Brother   . Diabetes Neg Hx   . Heart disease Brother   . Cancer Brother     prostate    History   Social History  . Marital Status: Married    Spouse Name: N/A   Number of Children: 1  . Years of Education: N/A   Occupational History  . retired    Social History Main Topics  . Smoking status: Never Smoker   . Smokeless tobacco: Former Neurosurgeon    Types: Chew    Quit date: 04/22/2004  . Alcohol Use: No  . Drug Use: Not on file  . Sexually Active: Not on file   Other Topics Concern  . Not on file   Social History Narrative   Has living willWife is health care POAwould accept CPR but no prolonged machines. Not sure about G-tube(discussed 10/29/05)   Review of Systems Appetite is fine Sleep is about the same---awakens 3-4 times per night (due to dreams and 2-3 per night nocturia) Occ daytime somnolence but better with brief naps (5-15 minutes) Weight is stable     Objective:   Physical Exam  Constitutional: He is oriented to person, place, and time. He appears well-developed and well-nourished. No distress.  Neck: Normal range of motion. Neck supple.  No thyromegaly present.  Cardiovascular: Normal rate, regular rhythm, normal heart sounds and intact distal pulses.  Exam reveals no gallop.   No murmur heard. Pulmonary/Chest: Effort normal and breath sounds normal. No respiratory distress. He has no wheezes. He has no rales.  Abdominal: Soft. He exhibits no mass. There is no tenderness.  Musculoskeletal: Normal range of motion. He exhibits no edema and no tenderness.  Lymphadenopathy:    He has no cervical adenopathy.  Neurological: He is alert and oriented to person, place, and time. He exhibits normal muscle tone.       President--"Obama, Bush, Bush, no Kennedy, no I mean [employer]" [phone] D-o...Marland KitchenMarland KitchenMarland Kitchen "I'm no good at that" Recall  1/3  Normal gait and strength  Skin: Skin is warm. No rash noted.  Psychiatric: He has a normal mood and affect. His behavior is normal. Judgment and thought content normal.          Assessment & Plan:

## 2011-03-11 ENCOUNTER — Emergency Department: Payer: 59 | Admitting: Emergency Medicine

## 2011-04-02 ENCOUNTER — Encounter: Payer: Self-pay | Admitting: Internal Medicine

## 2011-04-02 ENCOUNTER — Ambulatory Visit (INDEPENDENT_AMBULATORY_CARE_PROVIDER_SITE_OTHER): Payer: Medicare Other | Admitting: Internal Medicine

## 2011-04-02 DIAGNOSIS — G3184 Mild cognitive impairment, so stated: Secondary | ICD-10-CM

## 2011-04-02 DIAGNOSIS — M1990 Unspecified osteoarthritis, unspecified site: Secondary | ICD-10-CM

## 2011-04-02 DIAGNOSIS — I10 Essential (primary) hypertension: Secondary | ICD-10-CM

## 2011-04-02 DIAGNOSIS — E785 Hyperlipidemia, unspecified: Secondary | ICD-10-CM

## 2011-04-02 DIAGNOSIS — I6789 Other cerebrovascular disease: Secondary | ICD-10-CM

## 2011-04-02 NOTE — Assessment & Plan Note (Signed)
BP Readings from Last 3 Encounters:  04/02/11 114/66  10/10/10 110/62  04/10/10 140/70   Good control No changes since no orthostasis and has had stroke

## 2011-04-02 NOTE — Assessment & Plan Note (Signed)
Seems to be memory without clear executive function problems Will reassess at next visit

## 2011-04-02 NOTE — Assessment & Plan Note (Signed)
Uses 1-3 per day

## 2011-04-02 NOTE — Progress Notes (Signed)
Subjective:    Patient ID: Kyle [name], male    DOB: August 12, 1931, 77 y.o.   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 Occ uses cane and I encouraged this  No depression of note Not anhedonic  Current Outpatient Prescriptions on File Prior to Visit  Medication Sig Dispense Refill  . aspirin 81 MG tablet Take 81 mg by mouth daily.        . Cholecalciferol (VITAMIN D) 1000 UNITS capsule Take 1,000 Units by mouth daily.        . Fish Oil OIL Take 15 mLs by mouth daily.        [name] losartan-hydrochlorothiazide (HYZAAR) 50-12.5 MG per tablet Take 1 tablet by mouth daily.  90 tablet  3  . Red Yeast Rice 600 MG CAPS Take by mouth daily.        . traMADol (ULTRAM) 50 MG tablet Take 1 tablet (50 mg total) by mouth every 6 (six) hours as needed.  270 tablet  1  . vitamin B-12 (CYANOCOBALAMIN) 100 MCG tablet Take 50 mcg by mouth daily.          Allergies  Allergen Reactions  . Simvastatin     REACTION: myalgias    Past Medical History  Diagnosis Date  . History of prostate cancer   . Hyperlipidemia   . Hypertension   . Arthritis   . Stroke   . Impaired fasting glucose   . Detached retina     right    Past Surgical History  Procedure Date  . Hernia repair   . Prostate surgery     Family History  Problem Relation Age of Onset  . Heart disease Mother   . Coronary artery disease Mother    . Heart disease Father   . Coronary artery disease Father   . Heart disease Brother   . Diabetes Neg Hx   . Heart disease Brother   . Cancer Brother     prostate    History   Social History  . Marital Status: Married    Spouse Name: N/A    Number of Children: 1  . Years of Education: N/A   Occupational History  . retired    Social History Main Topics  . Smoking status: Never Smoker   . Smokeless tobacco: Former Neurosurgeon    Types: Chew    Quit date: 04/22/2004  . Alcohol Use: No  . Drug Use: Not on file  . Sexually Active: Not on file   Other Topics Concern  . Not on file   Social History Narrative   Has living willWife is health care POAwould accept CPR but no prolonged machines. Not sure about G-tube(discussed 10/29/05)   Review of Systems Appetite is good Weight is about the same Sleep is still disjointed---sleeps only couple of hours at a time  Objective:   Physical Exam  Constitutional: He appears well-developed and well-nourished. No distress.  Neck: Normal range of motion. Neck supple. No thyromegaly present.  Cardiovascular: Normal rate, regular rhythm, normal heart sounds and intact distal pulses.  Exam reveals no gallop.   No murmur heard. Pulmonary/Chest: Effort normal. No respiratory distress. He has no wheezes. He has rales.       Fine bibasilar crackles  Abdominal: Soft. There is no tenderness.  Musculoskeletal: He exhibits no edema and no tenderness.  Lymphadenopathy:    He has no cervical adenopathy.  Psychiatric: He has a normal mood and affect. His behavior is normal. Judgment and thought content normal.          Assessment & Plan:

## 2011-04-02 NOTE — Assessment & Plan Note (Signed)
Mild difficulty walking still Advised walking stick for uneven ground On asa

## 2011-04-02 NOTE — Assessment & Plan Note (Signed)
Lab Results  Component Value Date   LDLCALC 125* 04/11/2009   Was lower last time No statin since memory issues already

## 2011-04-12 ENCOUNTER — Ambulatory Visit: Payer: Medicare Other | Admitting: Internal Medicine

## 2011-06-12 DIAGNOSIS — H33319 Horseshoe tear of retina without detachment, unspecified eye: Secondary | ICD-10-CM | POA: Diagnosis not present

## 2011-06-12 DIAGNOSIS — H35379 Puckering of macula, unspecified eye: Secondary | ICD-10-CM | POA: Diagnosis not present

## 2011-10-01 ENCOUNTER — Encounter: Payer: Self-pay | Admitting: Internal Medicine

## 2011-10-01 ENCOUNTER — Ambulatory Visit (INDEPENDENT_AMBULATORY_CARE_PROVIDER_SITE_OTHER): Payer: Medicare Other | Admitting: Internal Medicine

## 2011-10-01 VITALS — BP 148/80 | HR 80 | Temp 98.1°F | Ht 68.0 in | Wt 201.0 lb

## 2011-10-01 DIAGNOSIS — M1990 Unspecified osteoarthritis, unspecified site: Secondary | ICD-10-CM

## 2011-10-01 DIAGNOSIS — I6350 Cerebral infarction due to unspecified occlusion or stenosis of unspecified cerebral artery: Secondary | ICD-10-CM

## 2011-10-01 DIAGNOSIS — G3184 Mild cognitive impairment, so stated: Secondary | ICD-10-CM

## 2011-10-01 DIAGNOSIS — I10 Essential (primary) hypertension: Secondary | ICD-10-CM | POA: Diagnosis not present

## 2011-10-01 DIAGNOSIS — I639 Cerebral infarction, unspecified: Secondary | ICD-10-CM

## 2011-10-01 DIAGNOSIS — E785 Hyperlipidemia, unspecified: Secondary | ICD-10-CM

## 2011-10-01 LAB — BASIC METABOLIC PANEL
BUN: 21 mg/dL (ref 6–23)
CO2: 28 mEq/L (ref 19–32)
Calcium: 9.4 mg/dL (ref 8.4–10.5)
Chloride: 108 mEq/L (ref 96–112)
Creatinine, Ser: 1.4 mg/dL (ref 0.4–1.5)
GFR: 50.78 mL/min — ABNORMAL LOW (ref >60.00)
Glucose, Bld: 96 mg/dL (ref 70–99)
Potassium: 3.7 mEq/L (ref 3.5–5.1)
Sodium: 143 mEq/L (ref 135–145)

## 2011-10-01 LAB — CBC WITH DIFFERENTIAL/PLATELET
Basophils Absolute: 0 10*3/uL (ref 0.0–0.1)
Basophils Relative: 0.5 % (ref 0.0–3.0)
Eosinophils Absolute: 0.2 10*3/uL (ref 0.0–0.7)
Eosinophils Relative: 2.2 % (ref 0.0–5.0)
HCT: 46.8 % (ref 39.0–52.0)
Hemoglobin: 15.6 g/dL (ref 13.0–17.0)
Lymphocytes Relative: 39.6 % (ref 12.0–46.0)
Lymphs Abs: 3.4 10*3/uL (ref 0.7–4.0)
MCHC: 33.3 g/dL (ref 30.0–36.0)
MCV: 93.8 fl (ref 78.0–100.0)
Monocytes Absolute: 0.7 10*3/uL (ref 0.1–1.0)
Monocytes Relative: 7.7 % (ref 3.0–12.0)
Neutro Abs: 4.3 10*3/uL (ref 1.4–7.7)
Neutrophils Relative %: 50 % (ref 43.0–77.0)
Platelets: 210 10*3/uL (ref 150.0–400.0)
RBC: 4.99 Mil/uL (ref 4.22–5.81)
RDW: 14.7 % — ABNORMAL HIGH (ref 11.5–14.6)
WBC: 8.6 10*3/uL (ref 4.5–10.5)

## 2011-10-01 LAB — HEPATIC FUNCTION PANEL
ALT: 13 U/L (ref 0–53)
AST: 17 U/L (ref 0–37)
Albumin: 3.9 g/dL (ref 3.5–5.2)
Alkaline Phosphatase: 62 U/L (ref 39–117)
Bilirubin, Direct: 0 mg/dL (ref 0.0–0.3)
Total Bilirubin: 0.3 mg/dL (ref 0.3–1.2)
Total Protein: 7.5 g/dL (ref 6.0–8.3)

## 2011-10-01 MED ORDER — TRAMADOL HCL 50 MG PO TABS: 50.0000 mg | ORAL_TABLET | Freq: Four times a day (QID) | ORAL | Status: DC | PRN

## 2011-10-01 MED ORDER — LOSARTAN POTASSIUM-HCTZ 50-12.5 MG PO TABS: 1.0000 | ORAL_TABLET | Freq: Every day | ORAL | Status: DC

## 2011-10-01 NOTE — Assessment & Plan Note (Signed)
Mild balance issues On aspriin

## 2011-10-01 NOTE — Assessment & Plan Note (Signed)
Ongoing pain Will reorder the tramadol

## 2011-10-01 NOTE — Assessment & Plan Note (Signed)
Discussed Rx Will hold off (some concern about memory issues)

## 2011-10-01 NOTE — Assessment & Plan Note (Signed)
BP Readings from Last 3 Encounters:  10/01/11 148/80  04/02/11 114/66  10/10/10 110/62   Up a little today but upset No changes for now BP Readings from Last 3 Encounters:  10/01/11 148/80  04/02/11 114/66  10/10/10 110/62   Lab Results  Component Value Date   CREATININE 1.3 10/10/2010

## 2011-10-01 NOTE — Assessment & Plan Note (Signed)
Doesn't seem to have changed

## 2011-10-01 NOTE — Progress Notes (Signed)
Subjective:    Patient ID: Kyle [name], male    DOB: 1932-03-09, 78 y.o.   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 No real change in past year Mostly some orthostatic dizziness  Arthritis is worse Ran out of tramadol 6 months ago--shoulders and hands were very bad Uses up to three times a day  Current Outpatient Prescriptions on File Prior to Visit  Medication Sig Dispense Refill  . aspirin 81 MG tablet Take 81 mg by mouth daily.        . Cholecalciferol (VITAMIN D) 1000 UNITS capsule Take 1,000 Units by mouth daily.        . Fish Oil OIL Take 15 mLs by mouth daily.        [name] losartan-hydrochlorothiazide (HYZAAR) 50-12.5 MG per tablet Take 1 tablet by mouth daily.  90 tablet  3  . Red Yeast Rice 600 MG CAPS Take by mouth daily.        . traMADol (ULTRAM) 50 MG tablet Take 1 tablet (50 mg total) by mouth every 6 (six) hours as needed.  270 tablet  1  . vitamin B-12 (CYANOCOBALAMIN) 100 MCG tablet Take 50 mcg by mouth daily.          Allergies  Allergen Reactions  . Simvastatin     REACTION: myalgias    Past Medical History  Diagnosis Date  . History of prostate cancer   . Hyperlipidemia   . Hypertension   . Arthritis   . Stroke   . Impaired fasting glucose   . Detached retina     right    Past Surgical History  Procedure Date  . Hernia repair   . Prostate surgery     Family History  Problem Relation Age of Onset  . Heart disease Mother   . Coronary artery disease Mother   . Heart disease Father   . Coronary artery disease Father   . Heart disease Brother   . Diabetes  Neg Hx   . Heart disease Brother   . Cancer Brother     prostate    History   Social History  . Marital Status: Married    Spouse Name: N/A    Number of Children: 1  . Years of Education: N/A   Occupational History  . retired    Social History Main Topics  . Smoking status: Never Smoker   . Smokeless tobacco: Former Neurosurgeon    Types: Chew    Quit date: 04/22/2004  . Alcohol Use: No  . Drug Use: Not on file  . Sexually Active: Not on file   Other Topics Concern  . Not on file   Social History Narrative   Has living willWife is health care POAwould accept CPR but no prolonged machines. Not sure about G-tube(discussed 10/29/05)   Review of Systems Appetite is fine Weight is stable Ongoing sleep problems---wakes up dreaming. Intermittent nocturia. Not tired in the morning. Muscles are stiff in the morning. Does take tylenol at bedtime     Objective:   Physical Exam  Constitutional: He appears well-developed and well-nourished. No  distress.  Neck: Normal range of motion. Neck supple. No thyromegaly present.  Cardiovascular: Normal rate, regular rhythm, normal heart sounds and intact distal pulses.  Exam reveals no gallop.   No murmur heard. Pulmonary/Chest: Effort normal and breath sounds normal. No respiratory distress. He has no wheezes. He has no rales.  Abdominal: Soft. There is no tenderness.  Musculoskeletal: He exhibits no edema and no tenderness.  Lymphadenopathy:    He has no cervical adenopathy.  Psychiatric: His behavior is normal.       Angry about med prescriptions--discussed Rx requirements for meds that can be diverted          Assessment & Plan:

## 2011-10-04 ENCOUNTER — Encounter: Payer: Self-pay | Admitting: *Deleted

## 2011-11-13 DIAGNOSIS — H3520 Other non-diabetic proliferative retinopathy, unspecified eye: Secondary | ICD-10-CM | POA: Diagnosis not present

## 2011-11-13 DIAGNOSIS — H33009 Unspecified retinal detachment with retinal break, unspecified eye: Secondary | ICD-10-CM | POA: Diagnosis not present

## 2011-11-13 DIAGNOSIS — Z961 Presence of intraocular lens: Secondary | ICD-10-CM | POA: Diagnosis not present

## 2011-12-05 DIAGNOSIS — L02619 Cutaneous abscess of unspecified foot: Secondary | ICD-10-CM | POA: Diagnosis not present

## 2011-12-05 DIAGNOSIS — L03119 Cellulitis of unspecified part of limb: Secondary | ICD-10-CM | POA: Diagnosis not present

## 2011-12-21 DIAGNOSIS — M5430 Sciatica, unspecified side: Secondary | ICD-10-CM | POA: Diagnosis not present

## 2011-12-26 DIAGNOSIS — M25559 Pain in unspecified hip: Secondary | ICD-10-CM | POA: Diagnosis not present

## 2011-12-26 DIAGNOSIS — M545 Low back pain, unspecified: Secondary | ICD-10-CM | POA: Diagnosis not present

## 2011-12-26 DIAGNOSIS — M5430 Sciatica, unspecified side: Secondary | ICD-10-CM | POA: Diagnosis not present

## 2011-12-26 DIAGNOSIS — M1990 Unspecified osteoarthritis, unspecified site: Secondary | ICD-10-CM | POA: Diagnosis not present

## 2011-12-28 DIAGNOSIS — R030 Elevated blood-pressure reading, without diagnosis of hypertension: Secondary | ICD-10-CM | POA: Diagnosis not present

## 2011-12-28 DIAGNOSIS — M5430 Sciatica, unspecified side: Secondary | ICD-10-CM | POA: Diagnosis not present

## 2011-12-28 DIAGNOSIS — M62838 Other muscle spasm: Secondary | ICD-10-CM | POA: Diagnosis not present

## 2012-01-01 DIAGNOSIS — M999 Biomechanical lesion, unspecified: Secondary | ICD-10-CM | POA: Diagnosis not present

## 2012-01-01 DIAGNOSIS — IMO0002 Reserved for concepts with insufficient information to code with codable children: Secondary | ICD-10-CM | POA: Diagnosis not present

## 2012-01-02 DIAGNOSIS — M999 Biomechanical lesion, unspecified: Secondary | ICD-10-CM | POA: Diagnosis not present

## 2012-01-02 DIAGNOSIS — IMO0002 Reserved for concepts with insufficient information to code with codable children: Secondary | ICD-10-CM | POA: Diagnosis not present

## 2012-01-06 ENCOUNTER — Encounter: Payer: Self-pay | Admitting: Internal Medicine

## 2012-01-06 ENCOUNTER — Ambulatory Visit (INDEPENDENT_AMBULATORY_CARE_PROVIDER_SITE_OTHER): Payer: Medicare Other | Admitting: Internal Medicine

## 2012-01-06 VITALS — BP 140/80 | HR 55 | Temp 98.0°F | Ht 68.0 in | Wt 181.0 lb

## 2012-01-06 DIAGNOSIS — Z8546 Personal history of malignant neoplasm of prostate: Secondary | ICD-10-CM | POA: Diagnosis not present

## 2012-01-06 DIAGNOSIS — M5431 Sciatica, right side: Secondary | ICD-10-CM | POA: Insufficient documentation

## 2012-01-06 DIAGNOSIS — M5430 Sciatica, unspecified side: Secondary | ICD-10-CM | POA: Diagnosis not present

## 2012-01-06 DIAGNOSIS — M999 Biomechanical lesion, unspecified: Secondary | ICD-10-CM | POA: Diagnosis not present

## 2012-01-06 DIAGNOSIS — IMO0002 Reserved for concepts with insufficient information to code with codable children: Secondary | ICD-10-CM | POA: Diagnosis not present

## 2012-01-06 NOTE — Progress Notes (Signed)
Subjective:    Patient ID: Kyle [name], male    DOB: Sep 04, 1931, 78 y.o.   MRN: [id number]  HPI Having trouble with right leg---didn't feel right Pain in right buttock cheek--radiating to groin and then down to knee Really bad pain Has to walk hunched over Started about 10 days ago but worsening  Notes some leg weakness No falls No known injury No specific back pain  Went to acute care for this last week Diagnosed with arthritis and ?sciatica. Did x-ray of right hip Tramadol has helped some Also got pain med at acute care--- not that helpful Current Outpatient Prescriptions on File Prior to Visit  Medication Sig Dispense Refill  . aspirin 81 MG tablet Take 81 mg by mouth daily.        . Cholecalciferol (VITAMIN D) 1000 UNITS capsule Take 1,000 Units by mouth daily.        . Fish Oil OIL Take 15 mLs by mouth daily.        [name] losartan-hydrochlorothiazide (HYZAAR) 50-12.5 MG per tablet Take 1 tablet by mouth daily.  90 tablet  3  . Red Yeast Rice 600 MG CAPS Take by mouth daily.        . traMADol (ULTRAM) 50 MG tablet Take 1 tablet (50 mg total) by mouth every 6 (six) hours as needed.  270 tablet  0  . vitamin B-12 (CYANOCOBALAMIN) 100 MCG tablet Take 50 mcg by mouth daily.          Allergies  Allergen Reactions  . Simvastatin     REACTION: myalgias    Past Medical History  Diagnosis Date  . History of prostate cancer   . Hyperlipidemia   . Hypertension   . Arthritis   . Stroke   . Impaired fasting glucose   . Detached retina     right    Past Surgical History  Procedure Date  . Hernia repair   . Prostate surgery     Family History  Problem Relation Age of Onset  . Heart disease Mother   . Coronary artery disease Mother   . Heart disease Father   . Coronary artery disease Father   . Heart disease Brother   . Diabetes Neg Hx   . Heart disease Brother   . Cancer Brother     prostate    History   Social History  . Marital Status: Married    Spouse  Name: N/A    Number of Children: 1  . Years of Education: N/A   Occupational History  . retired    Social History Main Topics  . Smoking status: Never Smoker   . Smokeless tobacco: Former Neurosurgeon    Types: Chew    Quit date: 04/22/2004  . Alcohol Use: No  . Drug Use: Not on file  . Sexually Active: Not on file   Other Topics Concern  . Not on file   Social History Narrative   Has living willWife is health care POAwould accept CPR but no prolonged machines. Not sure about G-tube(discussed 10/29/05)   Review of Systems No bowel ro bladder incontinence No clear fever but feels hot at times Knee pain is still bad---uses heat rub    Objective:   Physical Exam  Constitutional: He appears well-developed and well-nourished. No distress.  Musculoskeletal:       No spine tenderness Slight right hip bursa tenderness Pain recreated with passive internal rotation of right hip (shoots into buttock and down knee) SLR  limited by stiffness but not clearly abnormal  Neurological:       Antalgic gait improved by flexing back and leaning over Has weakness in right leg---knee and ankle flexors and extenders          Assessment & Plan:

## 2012-01-06 NOTE — Assessment & Plan Note (Addendum)
Clear radicular symptoms Most concerned with the weakness but still could be secondary to pain But does get better with bending over Prostate cancer 7 years ago or so---last PSA 0.1 but over a year ago--will check PSA today Tramadol does help the pain but he hasn't been taking it much Will increase his dosing If no better by later this week, can consider spine MRI---but he asks to hold off on this due to trouble he has being couped up like that (probably could do better with CT scan)  I will get records from acute care and the hip x-ray report

## 2012-01-06 NOTE — Patient Instructions (Signed)
Please take the tramadol regularly three times a day for now. If you are not better by later this week or early next week, please call for further plans

## 2012-01-07 LAB — BASIC METABOLIC PANEL
BUN: 21 mg/dL (ref 6–23)
CO2: 25 mEq/L (ref 19–32)
Calcium: 9.5 mg/dL (ref 8.4–10.5)
Chloride: 105 mEq/L (ref 96–112)
Creatinine, Ser: 1.7 mg/dL — ABNORMAL HIGH (ref 0.4–1.5)
GFR: 41.57 mL/min — ABNORMAL LOW (ref >60.00)
Glucose, Bld: 107 mg/dL — ABNORMAL HIGH (ref 70–99)
Potassium: 3.7 mEq/L (ref 3.5–5.1)
Sodium: 141 mEq/L (ref 135–145)

## 2012-01-07 LAB — PSA: PSA: 0.01 ng/mL — ABNORMAL LOW (ref 0.10–4.00)

## 2012-01-08 ENCOUNTER — Encounter: Payer: Self-pay | Admitting: *Deleted

## 2012-01-13 ENCOUNTER — Ambulatory Visit (INDEPENDENT_AMBULATORY_CARE_PROVIDER_SITE_OTHER): Payer: Medicare Other | Admitting: Internal Medicine

## 2012-01-13 ENCOUNTER — Encounter: Payer: Self-pay | Admitting: Internal Medicine

## 2012-01-13 VITALS — BP 160/80 | HR 108 | Temp 97.9°F | Ht 68.0 in | Wt 193.0 lb

## 2012-01-13 DIAGNOSIS — M5431 Sciatica, right side: Secondary | ICD-10-CM

## 2012-01-13 DIAGNOSIS — M5430 Sciatica, unspecified side: Secondary | ICD-10-CM

## 2012-01-13 MED ORDER — TRAMADOL HCL 50 MG PO TABS: 50.0000 mg | ORAL_TABLET | Freq: Three times a day (TID) | ORAL | Status: DC | PRN

## 2012-01-13 MED ORDER — LORAZEPAM 0.5 MG PO TABS: 0.5000 mg | ORAL_TABLET | Freq: Once | ORAL | Status: DC

## 2012-01-13 MED ORDER — PREDNISONE 20 MG PO TABS: 40.0000 mg | ORAL_TABLET | Freq: Every day | ORAL | Status: DC

## 2012-01-13 NOTE — Progress Notes (Signed)
  Subjective:    Patient ID: Kyle [name], male    DOB: [birth year], 78 y.o.   MRN: [id number]  HPI Feels no better  Starts in hip and radiates down to knee still Aching and heat sensation on anterior right thigh Pain is better when he walks "humped over" Can lie in bed--aches bad and he has to get up Goes to couch or recliner  Tramadol gives only a little relief Needs stronger med  Current Outpatient Prescriptions on File Prior to Visit  Medication Sig Dispense Refill  . aspirin 81 MG tablet Take 81 mg by mouth daily.        . Cholecalciferol (VITAMIN D) 1000 UNITS capsule Take 1,000 Units by mouth daily.        . Fish Oil OIL Take 15 mLs by mouth daily.        [name] losartan-hydrochlorothiazide (HYZAAR) 50-12.5 MG per tablet Take 1 tablet by mouth daily.  90 tablet  3  . Red Yeast Rice 600 MG CAPS Take by mouth daily.        . traMADol (ULTRAM) 50 MG tablet Take 1 tablet (50 mg total) by mouth every 6 (six) hours as needed.  270 tablet  0  . vitamin B-12 (CYANOCOBALAMIN) 100 MCG tablet Take 50 mcg by mouth daily.          Allergies  Allergen Reactions  . Simvastatin     REACTION: myalgias    Past Medical History  Diagnosis Date  . History of prostate cancer   . Hyperlipidemia   . Hypertension   . Arthritis   . Stroke   . Impaired fasting glucose   . Detached retina     right    Past Surgical History  Procedure Date  . Hernia repair   . Prostate surgery     Family History  Problem Relation Age of Onset  . Heart disease Mother   . Coronary artery disease Mother   . Heart disease Father   . Coronary artery disease Father   . Heart disease Brother   . Diabetes Neg Hx   . Heart disease Brother   . Cancer Brother     prostate    History   Social History  . Marital Status: Married    Spouse Name: N/A    Number of Children: 1  . Years of Education: N/A   Occupational History  . retired    Social History Main Topics  . Smoking status: Never Smoker   .  Smokeless tobacco: Former Neurosurgeon    Types: Chew    Quit date: 04/22/2004  . Alcohol Use: No  . Drug Use: Not on file  . Sexually Active: Not on file   Other Topics Concern  . Not on file   Social History Narrative   Has living willWife is health care POAwould accept CPR but no prolonged machines. Not sure about G-tube(discussed 10/29/05)   Review of Systems     Objective:   Physical Exam  Constitutional: He appears well-developed and well-nourished. No distress.  Neurological:       Ongoing right side weakness          Assessment & Plan:

## 2012-01-13 NOTE — Assessment & Plan Note (Signed)
Worse Still with weakness Will proceed with open MRI (CT will not give enough info per neuroradiologist) Course of prednisone Refer for neurosurg if sig nerve compression  Increase tramadol dose as percocet didn't help

## 2012-01-18 ENCOUNTER — Ambulatory Visit
Admission: RE | Admit: 2012-01-18 | Discharge: 2012-01-18 | Disposition: A | Discharge status: Home / Self Care | Payer: Medicare Other | Source: Ambulatory Visit | Attending: Internal Medicine | Admitting: Internal Medicine

## 2012-01-18 DIAGNOSIS — M5431 Sciatica, right side: Secondary | ICD-10-CM

## 2012-01-18 DIAGNOSIS — M48061 Spinal stenosis, lumbar region without neurogenic claudication: Secondary | ICD-10-CM | POA: Diagnosis not present

## 2012-01-20 ENCOUNTER — Telehealth: Payer: Self-pay | Admitting: Internal Medicine

## 2012-01-20 DIAGNOSIS — M5431 Sciatica, right side: Secondary | ICD-10-CM

## 2012-01-20 NOTE — Telephone Encounter (Signed)
Discussed the MRI report Seems to have ruptured disc L3-4 on right Explains his pain --which was not helped much by the oral steroids Will set up with neurosurgeon

## 2012-01-21 ENCOUNTER — Other Ambulatory Visit: Payer: Self-pay | Admitting: Neurosurgery

## 2012-01-21 DIAGNOSIS — M5126 Other intervertebral disc displacement, lumbar region: Secondary | ICD-10-CM | POA: Diagnosis not present

## 2012-01-23 ENCOUNTER — Encounter (HOSPITAL_COMMUNITY)
Admission: RE | Admit: 2012-01-23 | Discharge: 2012-01-23 | Disposition: A | Discharge status: Home / Self Care | Payer: Medicare Other | Source: Ambulatory Visit | Attending: Neurosurgery | Admitting: Neurosurgery

## 2012-01-23 ENCOUNTER — Encounter (HOSPITAL_COMMUNITY): Payer: Self-pay

## 2012-01-23 DIAGNOSIS — I10 Essential (primary) hypertension: Secondary | ICD-10-CM | POA: Diagnosis not present

## 2012-01-23 DIAGNOSIS — Z01811 Encounter for preprocedural respiratory examination: Secondary | ICD-10-CM | POA: Diagnosis not present

## 2012-01-23 HISTORY — DX: Urgency of urination: R39.15

## 2012-01-23 HISTORY — DX: Malignant (primary) neoplasm, unspecified: C80.1

## 2012-01-23 HISTORY — DX: Personal history of other specified conditions: Z87.898

## 2012-01-23 HISTORY — DX: Gastro-esophageal reflux disease without esophagitis: K21.9

## 2012-01-23 LAB — BASIC METABOLIC PANEL
BUN: 25 mg/dL — ABNORMAL HIGH (ref 6–23)
CO2: 28 mEq/L (ref 19–32)
Calcium: 9.7 mg/dL (ref 8.4–10.5)
Chloride: 104 mEq/L (ref 96–112)
Creatinine, Ser: 1.53 mg/dL — ABNORMAL HIGH (ref 0.50–1.35)
GFR calc Af Amer: 48 mL/min — ABNORMAL LOW (ref >90)
GFR calc non Af Amer: 42 mL/min — ABNORMAL LOW (ref >90)
Glucose, Bld: 100 mg/dL — ABNORMAL HIGH (ref 70–99)
Potassium: 3.9 mEq/L (ref 3.5–5.1)
Sodium: 140 mEq/L (ref 135–145)

## 2012-01-23 LAB — CBC
HCT: 49.6 % (ref 39.0–52.0)
Hemoglobin: 16.5 g/dL (ref 13.0–17.0)
MCH: 30.9 pg (ref 26.0–34.0)
MCHC: 33.3 g/dL (ref 30.0–36.0)
MCV: 92.9 fL (ref 78.0–100.0)
Platelets: 313 10*3/uL (ref 150–400)
RBC: 5.34 MIL/uL (ref 4.22–5.81)
RDW: 14.1 % (ref 11.5–15.5)
WBC: 13.5 10*3/uL — ABNORMAL HIGH (ref 4.0–10.5)

## 2012-01-23 LAB — SURGICAL PCR SCREEN
MRSA, PCR: NEGATIVE
Staphylococcus aureus: NEGATIVE

## 2012-01-23 MED ORDER — CEFAZOLIN SODIUM-DEXTROSE 2-3 GM-% IV SOLR
2.0000 g | INTRAVENOUS | Status: AC
  Administered 2012-01-24: 2 g via INTRAVENOUS
  Filled 2012-01-23: qty 50

## 2012-01-23 NOTE — Pre-Procedure Instructions (Signed)
20 Kyle [name]  01/23/2012   Your procedure is scheduled on:  Friday January 24, 2012.  Report to [facility] at 1045 AM.  Call this number if you have problems the morning of surgery: [phone]   Remember:   Do not eat food or drink:After Midnight.    Take these medicines the morning of surgery with A SIP OF WATER: Tramadol (Ultram( if needed for pain.   Do not wear jewelry  Do not wear lotions or colognes.  Men may shave face and neck.  Do not bring valuables to the hospital.  Contacts, dentures or bridgework may not be worn into surgery.  Leave suitcase in the car. After surgery it may be brought to your room.  For patients admitted to the hospital, checkout time is 11:00 AM the day of discharge.   Patients discharged the day of surgery will not be allowed to drive home.  Name and phone number of your driver:   Special Instructions: Shower using CHG 2 nights before surgery and the night before surgery.  If you shower the day of surgery use CHG.  Use special wash - you have one bottle of CHG for all showers.  You should use approximately 1/3 of the bottle for each shower.   Please read over the following fact sheets that you were given: Pain Booklet, Coughing and Deep Breathing, MRSA Information and Surgical Site Infection Prevention

## 2012-01-23 NOTE — Progress Notes (Signed)
Patient informed Nurse that he thought he had a cardiac cath approximately 10 years ago at [facility]. PCP is Kyle [name] and LOV was last week. Patient denied having a sleep study.

## 2012-01-24 ENCOUNTER — Inpatient Hospital Stay (HOSPITAL_COMMUNITY): Payer: Medicare Other

## 2012-01-24 ENCOUNTER — Encounter (HOSPITAL_COMMUNITY)
Admission: RE | Disposition: A | Discharge status: Home / Self Care | Payer: Self-pay | Source: Ambulatory Visit | Attending: Neurosurgery

## 2012-01-24 ENCOUNTER — Inpatient Hospital Stay (HOSPITAL_COMMUNITY)
Admission: RE | Admit: 2012-01-24 | Discharge: 2012-01-25 | DRG: 491 | Disposition: A | Discharge status: Home / Self Care | Payer: Medicare Other | Source: Ambulatory Visit | Attending: Neurosurgery | Admitting: Neurosurgery

## 2012-01-24 ENCOUNTER — Encounter (HOSPITAL_COMMUNITY): Payer: Self-pay | Admitting: *Deleted

## 2012-01-24 ENCOUNTER — Encounter (HOSPITAL_COMMUNITY): Payer: Self-pay | Admitting: Surgery

## 2012-01-24 ENCOUNTER — Encounter (HOSPITAL_COMMUNITY): Payer: Self-pay | Admitting: Anesthesiology

## 2012-01-24 ENCOUNTER — Inpatient Hospital Stay (HOSPITAL_COMMUNITY): Payer: Medicare Other | Admitting: Anesthesiology

## 2012-01-24 DIAGNOSIS — E785 Hyperlipidemia, unspecified: Secondary | ICD-10-CM | POA: Diagnosis not present

## 2012-01-24 DIAGNOSIS — Z7982 Long term (current) use of aspirin: Secondary | ICD-10-CM

## 2012-01-24 DIAGNOSIS — M5126 Other intervertebral disc displacement, lumbar region: Secondary | ICD-10-CM | POA: Diagnosis not present

## 2012-01-24 DIAGNOSIS — Z8546 Personal history of malignant neoplasm of prostate: Secondary | ICD-10-CM

## 2012-01-24 DIAGNOSIS — Z8673 Personal history of transient ischemic attack (TIA), and cerebral infarction without residual deficits: Secondary | ICD-10-CM | POA: Diagnosis not present

## 2012-01-24 DIAGNOSIS — I10 Essential (primary) hypertension: Secondary | ICD-10-CM | POA: Diagnosis not present

## 2012-01-24 DIAGNOSIS — M519 Unspecified thoracic, thoracolumbar and lumbosacral intervertebral disc disorder: Secondary | ICD-10-CM | POA: Diagnosis not present

## 2012-01-24 DIAGNOSIS — K219 Gastro-esophageal reflux disease without esophagitis: Secondary | ICD-10-CM | POA: Diagnosis not present

## 2012-01-24 HISTORY — PX: LUMBAR LAMINECTOMY/DECOMPRESSION MICRODISCECTOMY: SHX5026

## 2012-01-24 SURGERY — LUMBAR LAMINECTOMY/DECOMPRESSION MICRODISCECTOMY 1 LEVEL
Anesthesia: General | Site: Spine Lumbar | Laterality: Right | Wound class: Clean

## 2012-01-24 MED ORDER — ACETAMINOPHEN 10 MG/ML IV SOLN
1000.0000 mg | Freq: Four times a day (QID) | INTRAVENOUS | Status: DC
  Administered 2012-01-24 – 2012-01-25 (×2): 1000 mg via INTRAVENOUS
  Filled 2012-01-24 (×4): qty 100

## 2012-01-24 MED ORDER — VITAMIN D3 25 MCG (1000 UNIT) PO TABS
1000.0000 [IU] | ORAL_TABLET | Freq: Every day | ORAL | Status: DC
  Administered 2012-01-25: 1000 [IU] via ORAL
  Filled 2012-01-24 (×2): qty 1

## 2012-01-24 MED ORDER — TRAMADOL HCL 50 MG PO TABS
50.0000 mg | ORAL_TABLET | Freq: Four times a day (QID) | ORAL | Status: DC | PRN
  Administered 2012-01-24: 50 mg via ORAL
  Filled 2012-01-24: qty 2

## 2012-01-24 MED ORDER — FENTANYL CITRATE 0.05 MG/ML IJ SOLN
INTRAMUSCULAR | Status: DC | PRN
  Administered 2012-01-24: 100 ug via INTRAVENOUS

## 2012-01-24 MED ORDER — LIDOCAINE HCL (CARDIAC) 20 MG/ML IV SOLN
INTRAVENOUS | Status: DC | PRN
  Administered 2012-01-24: 100 mg via INTRAVENOUS

## 2012-01-24 MED ORDER — OXYCODONE HCL 5 MG PO TABS
5.0000 mg | ORAL_TABLET | Freq: Once | ORAL | Status: AC | PRN
  Administered 2012-01-24: 5 mg via ORAL

## 2012-01-24 MED ORDER — LOSARTAN POTASSIUM 50 MG PO TABS
50.0000 mg | ORAL_TABLET | Freq: Every day | ORAL | Status: DC
  Administered 2012-01-24: 50 mg via ORAL
  Filled 2012-01-24 (×2): qty 1

## 2012-01-24 MED ORDER — RED YEAST RICE 600 MG PO CAPS: 1.0000 | ORAL_CAPSULE | Freq: Every day | ORAL | Status: DC

## 2012-01-24 MED ORDER — ZOLPIDEM TARTRATE 5 MG PO TABS: 5.0000 mg | ORAL_TABLET | Freq: Every evening | ORAL | Status: DC | PRN

## 2012-01-24 MED ORDER — PNEUMOCOCCAL VAC POLYVALENT 25 MCG/0.5ML IJ INJ
0.5000 mL | INJECTION | INTRAMUSCULAR | Status: DC
  Filled 2012-01-24: qty 0.5

## 2012-01-24 MED ORDER — ROCURONIUM BROMIDE 100 MG/10ML IV SOLN
INTRAVENOUS | Status: DC | PRN
  Administered 2012-01-24: 50 mg via INTRAVENOUS

## 2012-01-24 MED ORDER — FENTANYL CITRATE 0.05 MG/ML IJ SOLN
INTRAMUSCULAR | Status: AC
  Filled 2012-01-24: qty 2

## 2012-01-24 MED ORDER — THROMBIN 5000 UNITS EX KIT
PACK | CUTANEOUS | Status: DC | PRN
  Administered 2012-01-24 (×2): 5000 [IU] via TOPICAL

## 2012-01-24 MED ORDER — INFLUENZA VIRUS VACC SPLIT PF IM SUSP
0.5000 mL | INTRAMUSCULAR | Status: DC
  Filled 2012-01-24: qty 0.5

## 2012-01-24 MED ORDER — KETOROLAC TROMETHAMINE 30 MG/ML IJ SOLN
15.0000 mg | Freq: Four times a day (QID) | INTRAMUSCULAR | Status: DC
  Administered 2012-01-24 – 2012-01-25 (×3): 15 mg via INTRAVENOUS
  Filled 2012-01-24 (×7): qty 1

## 2012-01-24 MED ORDER — NEOSTIGMINE METHYLSULFATE 1 MG/ML IJ SOLN
INTRAMUSCULAR | Status: DC | PRN
  Administered 2012-01-24: 5 mg via INTRAVENOUS

## 2012-01-24 MED ORDER — METHYLPREDNISOLONE ACETATE 80 MG/ML IJ SUSP
INTRAMUSCULAR | Status: DC | PRN
  Administered 2012-01-24: 80 mg

## 2012-01-24 MED ORDER — OXYCODONE HCL 5 MG PO TABS
ORAL_TABLET | ORAL | Status: AC
  Filled 2012-01-24: qty 1

## 2012-01-24 MED ORDER — LOSARTAN POTASSIUM-HCTZ 50-12.5 MG PO TABS: 1.0000 | ORAL_TABLET | Freq: Every day | ORAL | Status: DC

## 2012-01-24 MED ORDER — MIDAZOLAM HCL 5 MG/5ML IJ SOLN
INTRAMUSCULAR | Status: DC | PRN
  Administered 2012-01-24: 2 mg via INTRAVENOUS

## 2012-01-24 MED ORDER — HYDROMORPHONE HCL PF 1 MG/ML IJ SOLN
INTRAMUSCULAR | Status: AC
  Filled 2012-01-24: qty 1

## 2012-01-24 MED ORDER — POTASSIUM CHLORIDE IN NACL 20-0.9 MEQ/L-% IV SOLN
INTRAVENOUS | Status: DC
  Administered 2012-01-24: 23:00:00 via INTRAVENOUS
  Filled 2012-01-24 (×3): qty 1000

## 2012-01-24 MED ORDER — MENTHOL 3 MG MT LOZG: 1.0000 | LOZENGE | OROMUCOSAL | Status: DC | PRN

## 2012-01-24 MED ORDER — OXYCODONE HCL 5 MG/5ML PO SOLN: 5.0000 mg | Freq: Once | ORAL | Status: AC | PRN

## 2012-01-24 MED ORDER — SODIUM CHLORIDE 0.9 % IV SOLN: 250.0000 mL | INTRAVENOUS | Status: DC

## 2012-01-24 MED ORDER — HYDROMORPHONE HCL PF 1 MG/ML IJ SOLN
0.2500 mg | INTRAMUSCULAR | Status: DC | PRN
  Administered 2012-01-24: 0.25 mg via INTRAVENOUS
  Administered 2012-01-24: 0.75 mg via INTRAVENOUS

## 2012-01-24 MED ORDER — FENTANYL CITRATE 0.05 MG/ML IJ SOLN
INTRAMUSCULAR | Status: DC | PRN
  Administered 2012-01-24 (×2): 50 ug via INTRAVENOUS
  Administered 2012-01-24: 100 ug via INTRAVENOUS
  Administered 2012-01-24 (×3): 50 ug via INTRAVENOUS

## 2012-01-24 MED ORDER — HEMOSTATIC AGENTS (NO CHARGE) OPTIME
TOPICAL | Status: DC | PRN
  Administered 2012-01-24: 1 via TOPICAL

## 2012-01-24 MED ORDER — ONDANSETRON HCL 4 MG/2ML IJ SOLN: 4.0000 mg | INTRAMUSCULAR | Status: DC | PRN

## 2012-01-24 MED ORDER — SODIUM CHLORIDE 0.9 % IJ SOLN: 3.0000 mL | INTRAMUSCULAR | Status: DC | PRN

## 2012-01-24 MED ORDER — MEPERIDINE HCL 25 MG/ML IJ SOLN: 6.2500 mg | INTRAMUSCULAR | Status: DC | PRN

## 2012-01-24 MED ORDER — HYDROCHLOROTHIAZIDE 12.5 MG PO CAPS
12.5000 mg | ORAL_CAPSULE | Freq: Every day | ORAL | Status: DC
  Administered 2012-01-24: 12.5 mg via ORAL
  Filled 2012-01-24 (×2): qty 1

## 2012-01-24 MED ORDER — ACETAMINOPHEN 325 MG PO TABS: 650.0000 mg | ORAL_TABLET | ORAL | Status: DC | PRN

## 2012-01-24 MED ORDER — GLYCOPYRROLATE 0.2 MG/ML IJ SOLN
INTRAMUSCULAR | Status: DC | PRN
  Administered 2012-01-24: .6 mg via INTRAVENOUS

## 2012-01-24 MED ORDER — ACETAMINOPHEN 10 MG/ML IV SOLN
INTRAVENOUS | Status: AC
  Administered 2012-01-24: 1000 mg
  Filled 2012-01-24: qty 100

## 2012-01-24 MED ORDER — CYCLOBENZAPRINE HCL 10 MG PO TABS: 10.0000 mg | ORAL_TABLET | Freq: Three times a day (TID) | ORAL | Status: DC | PRN

## 2012-01-24 MED ORDER — ONDANSETRON HCL 4 MG/2ML IJ SOLN
INTRAMUSCULAR | Status: DC | PRN
  Administered 2012-01-24: 4 mg via INTRAVENOUS

## 2012-01-24 MED ORDER — LIDOCAINE-EPINEPHRINE 0.5 %-1:200000 IJ SOLN
INTRAMUSCULAR | Status: DC | PRN
  Administered 2012-01-24: 10 mL

## 2012-01-24 MED ORDER — ACETAMINOPHEN 650 MG RE SUPP: 650.0000 mg | RECTAL | Status: DC | PRN

## 2012-01-24 MED ORDER — ASPIRIN 81 MG PO CHEW
81.0000 mg | CHEWABLE_TABLET | Freq: Every day | ORAL | Status: DC
  Administered 2012-01-24 – 2012-01-25 (×2): 81 mg via ORAL
  Filled 2012-01-24 (×2): qty 1

## 2012-01-24 MED ORDER — MORPHINE SULFATE 2 MG/ML IJ SOLN: 1.0000 mg | INTRAMUSCULAR | Status: DC | PRN

## 2012-01-24 MED ORDER — KETOROLAC TROMETHAMINE 30 MG/ML IJ SOLN
INTRAMUSCULAR | Status: AC
  Filled 2012-01-24: qty 1

## 2012-01-24 MED ORDER — SODIUM CHLORIDE 0.9 % IJ SOLN
3.0000 mL | Freq: Two times a day (BID) | INTRAMUSCULAR | Status: DC
  Administered 2012-01-24: 3 mL via INTRAVENOUS

## 2012-01-24 MED ORDER — VITAMIN D 1000 UNITS PO CAPS: 1000.0000 [IU] | ORAL_CAPSULE | Freq: Every day | ORAL | Status: DC

## 2012-01-24 MED ORDER — LACTATED RINGERS IV SOLN
INTRAVENOUS | Status: DC | PRN
  Administered 2012-01-24 (×2): via INTRAVENOUS

## 2012-01-24 MED ORDER — VITAMIN B-12 100 MCG PO TABS
50.0000 ug | ORAL_TABLET | Freq: Every day | ORAL | Status: DC
  Administered 2012-01-25: 50 ug via ORAL
  Filled 2012-01-24 (×2): qty 1

## 2012-01-24 MED ORDER — PROPOFOL 10 MG/ML IV BOLUS
INTRAVENOUS | Status: DC | PRN
  Administered 2012-01-24: 120 mg via INTRAVENOUS

## 2012-01-24 MED ORDER — 0.9 % SODIUM CHLORIDE (POUR BTL) OPTIME
TOPICAL | Status: DC | PRN
  Administered 2012-01-24: 1000 mL

## 2012-01-24 MED ORDER — ONDANSETRON HCL 4 MG/2ML IJ SOLN: 4.0000 mg | Freq: Once | INTRAMUSCULAR | Status: DC | PRN

## 2012-01-24 MED ORDER — PHENOL 1.4 % MT LIQD: 1.0000 | OROMUCOSAL | Status: DC | PRN

## 2012-01-24 SURGICAL SUPPLY — 55 items
BAG DECANTER FOR FLEXI CONT (MISCELLANEOUS) ×2 IMPLANT
BENZOIN TINCTURE PRP APPL 2/3 (GAUZE/BANDAGES/DRESSINGS) IMPLANT
BLADE SURG ROTATE 9660 (MISCELLANEOUS) IMPLANT
BUR MATCHSTICK NEURO 3.0 LAGG (BURR) ×2 IMPLANT
CANISTER SUCTION 2500CC (MISCELLANEOUS) ×2 IMPLANT
CLOTH BEACON ORANGE TIMEOUT ST (SAFETY) ×2 IMPLANT
CONT SPEC 4OZ CLIKSEAL STRL BL (MISCELLANEOUS) ×2 IMPLANT
CORDS BIPOLAR (ELECTRODE) ×2 IMPLANT
DECANTER SPIKE VIAL GLASS SM (MISCELLANEOUS) ×2 IMPLANT
DERMABOND ADHESIVE PROPEN (GAUZE/BANDAGES/DRESSINGS) ×1
DERMABOND ADVANCED (GAUZE/BANDAGES/DRESSINGS)
DERMABOND ADVANCED .7 DNX12 (GAUZE/BANDAGES/DRESSINGS) IMPLANT
DERMABOND ADVANCED .7 DNX6 (GAUZE/BANDAGES/DRESSINGS) ×1 IMPLANT
DRAPE LAPAROTOMY 100X72X124 (DRAPES) ×2 IMPLANT
DRAPE MICROSCOPE LEICA (MISCELLANEOUS) ×2 IMPLANT
DRAPE POUCH INSTRU U-SHP 10X18 (DRAPES) ×2 IMPLANT
DRAPE SURG 17X23 STRL (DRAPES) ×2 IMPLANT
DURAPREP 26ML APPLICATOR (WOUND CARE) ×2 IMPLANT
ELECT REM PT RETURN 9FT ADLT (ELECTROSURGICAL) ×2
ELECTRODE REM PT RTRN 9FT ADLT (ELECTROSURGICAL) ×1 IMPLANT
GAUZE SPONGE 4X4 16PLY XRAY LF (GAUZE/BANDAGES/DRESSINGS) IMPLANT
GLOVE BIO SURGEON STRL SZ 6.5 (GLOVE) ×2 IMPLANT
GLOVE BIOGEL PI IND STRL 6.5 (GLOVE) ×1 IMPLANT
GLOVE BIOGEL PI INDICATOR 6.5 (GLOVE) ×1
GLOVE ECLIPSE 6.5 STRL STRAW (GLOVE) ×2 IMPLANT
GLOVE ECLIPSE 7.5 STRL STRAW (GLOVE) ×2 IMPLANT
GLOVE EXAM NITRILE LRG STRL (GLOVE) IMPLANT
GLOVE EXAM NITRILE MD LF STRL (GLOVE) IMPLANT
GLOVE EXAM NITRILE XL STR (GLOVE) IMPLANT
GLOVE EXAM NITRILE XS STR PU (GLOVE) IMPLANT
GOWN BRE IMP SLV AUR LG STRL (GOWN DISPOSABLE) ×6 IMPLANT
GOWN BRE IMP SLV AUR XL STRL (GOWN DISPOSABLE) IMPLANT
GOWN STRL REIN 2XL LVL4 (GOWN DISPOSABLE) IMPLANT
KIT BASIN OR (CUSTOM PROCEDURE TRAY) ×2 IMPLANT
KIT ROOM TURNOVER OR (KITS) ×2 IMPLANT
NEEDLE HYPO 18GX1.5 BLUNT FILL (NEEDLE) ×2 IMPLANT
NEEDLE HYPO 25X1 1.5 SAFETY (NEEDLE) ×2 IMPLANT
NEEDLE SPNL 18GX3.5 QUINCKE PK (NEEDLE) ×2 IMPLANT
NS IRRIG 1000ML POUR BTL (IV SOLUTION) ×2 IMPLANT
PACK LAMINECTOMY NEURO (CUSTOM PROCEDURE TRAY) ×2 IMPLANT
PAD ARMBOARD 7.5X6 YLW CONV (MISCELLANEOUS) ×10 IMPLANT
RUBBERBAND STERILE (MISCELLANEOUS) ×4 IMPLANT
SPONGE GAUZE 4X4 12PLY (GAUZE/BANDAGES/DRESSINGS) IMPLANT
SPONGE LAP 4X18 X RAY DECT (DISPOSABLE) IMPLANT
SPONGE SURGIFOAM ABS GEL SZ50 (HEMOSTASIS) ×2 IMPLANT
STRIP CLOSURE SKIN 1/2X4 (GAUZE/BANDAGES/DRESSINGS) IMPLANT
SUT VIC AB 0 CT1 18XCR BRD8 (SUTURE) ×1 IMPLANT
SUT VIC AB 0 CT1 8-18 (SUTURE) ×1
SUT VIC AB 2-0 CT1 18 (SUTURE) ×2 IMPLANT
SUT VIC AB 3-0 SH 8-18 (SUTURE) ×2 IMPLANT
SYR 20ML ECCENTRIC (SYRINGE) ×2 IMPLANT
SYR 5ML LL (SYRINGE) ×2 IMPLANT
TOWEL OR 17X24 6PK STRL BLUE (TOWEL DISPOSABLE) ×2 IMPLANT
TOWEL OR 17X26 10 PK STRL BLUE (TOWEL DISPOSABLE) ×2 IMPLANT
WATER STERILE IRR 1000ML POUR (IV SOLUTION) ×2 IMPLANT

## 2012-01-24 NOTE — Preoperative (Signed)
Beta Blockers   Reason not to administer Beta Blockers:Not Applicable 

## 2012-01-24 NOTE — Discharge Summary (Signed)
Physician Discharge Summary  Patient ID: LARS JEZIORSKI MRN: [id number] DOB/AGE: [birth year] 76 y.o.  Admit date: 01/24/2012 Discharge date: 01/24/2012  Admission Diagnoses:HNP L3/4 right  Discharge Diagnoses: Hnp Right L3/4 Principal Problem:  *HNP (herniated nucleus pulposus), lumbar   Discharged Condition: good  Hospital Course: Mr. Nott was admitted and taken to the operating room where he underwent a far lateral discetomy at L3/4. Post op he has done well. He has ambulated, tolerated regular diet, and voided. His pain is improved at discharge. His wound is clean, dry, and without signs of infection.   Consults: None  Significant Diagnostic Studies: none  Treatments: surgery: as above  Discharge Exam: Blood pressure 163/96, pulse 79, temperature 97 F (36.1 C), temperature source Oral, resp. rate 18, SpO2 100.00%. General appearance: alert, cooperative and appears stated age Neurologic: Mental status: Alert, oriented, thought content appropriate Cranial nerves: normal Sensory: normal Motor: mild weakness in right hip flexors  Disposition:   Discharge Orders    Future Appointments: Provider: Department: Dept Phone: Center:   04/01/2012 8:00 AM Karie Schwalbe, MD [facility] [phone] [id number]       Medication List     As of 01/24/2012  8:11 PM    TAKE these medications         aspirin 81 MG tablet   Take 81 mg by mouth daily.      cyclobenzaprine 10 MG tablet   Commonly known as: FLEXERIL   Take 1 tablet (10 mg total) by mouth 3 (three) times daily as needed for muscle spasms.      fish oil-omega-3 fatty acids 1000 MG capsule   Take 1 g by mouth daily.      losartan-hydrochlorothiazide 50-12.5 MG per tablet   Commonly known as: HYZAAR   Take 1 tablet by mouth daily.      Red Yeast Rice 600 MG Caps   Take 1 capsule by mouth daily.      traMADol 50 MG tablet   Commonly known as: ULTRAM   Take 1-2 tablets (50-100 mg total) by mouth every 8  (eight) hours as needed.      vitamin B-12 50 MCG tablet   Commonly known as: CYANOCOBALAMIN   Take 50 mcg by mouth daily.      Vitamin D 1000 UNITS capsule   Take 1,000 Units by mouth daily.           Follow-up Information    Follow up with Yarima Penman L, MD. In 4 weeks. (call to make appt)    Contact information:   [address] [place] [address] [phone]          Signed: Denaja Verhoeven L 01/24/2012, 8:11 PM

## 2012-01-24 NOTE — Anesthesia Postprocedure Evaluation (Signed)
  Anesthesia Post-op Note  Patient: Kyle [name]  Procedure(s) Performed: Procedure(s) (LRB) with comments: LUMBAR LAMINECTOMY/DECOMPRESSION MICRODISCECTOMY 1 LEVEL (Right) - RIGHT Lumbar Three-Four far lateral diskectomy  Patient Location: PACU  Anesthesia Type: General  Level of Consciousness: awake  Airway and Oxygen Therapy: Patient Spontanous Breathing  Post-op Pain: mild  Post-op Assessment: Post-op Vital signs reviewed  Post-op Vital Signs: stable  Complications: No apparent anesthesia complications

## 2012-01-24 NOTE — Progress Notes (Signed)
Pt requests to keep on undergarments.  Explained need to remove & pt able to verbalize understanding. Disposable brief applied.

## 2012-01-24 NOTE — Anesthesia Preprocedure Evaluation (Addendum)
Anesthesia Evaluation  Patient identified by MRN, date of birth, ID band Patient awake    Reviewed: Allergy & Precautions, H&P , NPO status , Patient's Chart, lab work & pertinent test results, reviewed documented beta blocker date and time   Airway Mallampati: I TM Distance: >3 FB Neck ROM: Full    Dental  (+) Edentulous Upper and Edentulous Lower   Pulmonary          Cardiovascular hypertension, Pt. on medications     Neuro/Psych CVA, No Residual Symptoms    GI/Hepatic GERD-  Medicated,  Endo/Other    Renal/GU      Musculoskeletal   Abdominal   Peds  Hematology   Anesthesia Other Findings   Reproductive/Obstetrics                          Anesthesia Physical Anesthesia Plan  ASA: II  Anesthesia Plan: General   Post-op Pain Management:    Induction: Intravenous  Airway Management Planned: Oral ETT  Additional Equipment:   Intra-op Plan:   Post-operative Plan: Extubation in OR  Informed Consent: I have reviewed the patients History and Physical, chart, labs and discussed the procedure including the risks, benefits and alternatives for the proposed anesthesia with the patient or authorized representative who has indicated his/her understanding and acceptance.     Plan Discussed with: CRNA, Surgeon and Anesthesiologist  Anesthesia Plan Comments:        Anesthesia Quick Evaluation

## 2012-01-24 NOTE — Op Note (Signed)
01/24/2012  7:25 PM  PATIENT:  Kyle [name]  78 y.o. male  PRE-OPERATIVE DIAGNOSIS:  lumbar herniated disc far lateral L3/4  POST-OPERATIVE DIAGNOSIS:  lumbar herniated disc far lateral L3/4  PROCEDURE:  Procedure(s): LUMBAR LAMINECTOMY/DECOMPRESSION far lateral MICRODISCECTOMY 1 LEVEL L4/5 Microdissection  SURGEON:  Surgeon(s): Carmela Hurt, MD Clydene Fake, MD  ASSISTANTS:Hirsch  ANESTHESIA:   general  EBL:  Total I/O In: 700 [I.V.:700] Out: 25 [Blood:25]  BLOOD ADMINISTERED:none  CELL SAVER GIVEN:none  COUNT:per nursing  DRAINS: none   SPECIMEN:  No Specimen  DICTATION: [name] was brought to the operating room intubated and placed under a general anesthetic. He was rolled prone onto a Wilson frame with all pressure points padded. His back was prepped and draped in a sterile manner. I infiltrated lidocaine into the lumbar region. I opened the skin with a 10 blade and took the incision down to the thoracolumbar fascia. I exposed the lamina of L3 and L2. I confirmed my location with intraoperative xray. I drilled the lateral aspect of the L3 pars to expose the L3 nerve root. I also had to remove some of the superior facet of L4 for exposure also. I brought the microscope into the operative field and with Dr. Doreen Beam assistance we identified the nerve root. The root was quite taught and not mobile medially. I dissected around the nerve with microdissection and was able to move it laterally. At that time a large disc herniation was appreciated. I opened the disc with a 15 blade, and started the discetomy.  I removed a great deal of disc from the rupture. The disc was degenerated, there was also some endplate along with the disc. At the end the L3 root was free in its travel through the foramina. I infiltrated fentanyl and solumedrol into the resection site. We then closed the wound in layered fashion. I used vicryl sutures to approximate the thoracolumbar, subcutaneous, and  subcuticular planes. I used dermabond for a sterile dressing.   PLAN OF CARE: Admit to inpatient   PATIENT DISPOSITION:  PACU - hemodynamically stable.   Delay start of Pharmacological VTE agent (>24hrs) due to surgical blood loss or risk of bleeding:  yes

## 2012-01-24 NOTE — H&P (Signed)
Kyle [name] is an 78 y.o. male.   Chief Complaint: right lower extremity pain HPI: Right hip and lower extremity pain ~4-5 weeks. Pain is severe, he has failed conservative treatment.  Past Medical History  Diagnosis Date  . History of prostate cancer   . Hyperlipidemia   . Hypertension   . Arthritis   . Impaired fasting glucose   . Detached retina     right  . Pneumonia   . H/O dizziness   . Stroke     x 3  . Urgency of urination   . Cancer     prostate CA  . GERD (gastroesophageal reflux disease)     Past Surgical History  Procedure Date  . Hernia repair   . Prostate surgery   . Tonsillectomy   . Eye surgery   . Colonoscopy w/ polypectomy     Family History  Problem Relation Age of Onset  . Heart disease Mother   . Coronary artery disease Mother   . Heart disease Father   . Coronary artery disease Father   . Heart disease Brother   . Diabetes Neg Hx   . Heart disease Brother   . Cancer Brother     prostate   Social History:  reports that he has never smoked. He quit smokeless tobacco use about 7 years ago. His smokeless tobacco use included Chew. He reports that he does not drink alcohol or use illicit drugs.  Allergies:  Allergies  Allergen Reactions  . Simvastatin     REACTION: myalgias    Medications Prior to Admission  Medication Sig Dispense Refill  . aspirin 81 MG tablet Take 81 mg by mouth daily.        . Cholecalciferol (VITAMIN D) 1000 UNITS capsule Take 1,000 Units by mouth daily.        . fish oil-omega-3 fatty acids 1000 MG capsule Take 1 g by mouth daily.      [name] losartan-hydrochlorothiazide (HYZAAR) 50-12.5 MG per tablet Take 1 tablet by mouth daily.  90 tablet  3  . Red Yeast Rice 600 MG CAPS Take 1 capsule by mouth daily.       . traMADol (ULTRAM) 50 MG tablet Take 1-2 tablets (50-100 mg total) by mouth every 8 (eight) hours as needed.  300 tablet  1  . vitamin B-12 (CYANOCOBALAMIN) 50 MCG tablet Take 50 mcg by mouth daily.         Results for orders placed during the hospital encounter of 01/23/12 (from the past 48 hour(s))  BASIC METABOLIC PANEL     Status: Abnormal   Collection Time   01/23/12 10:04 AM      Component Value Range Comment   Sodium 140  135 - 145 mEq/L    Potassium 3.9  3.5 - 5.1 mEq/L    Chloride 104  96 - 112 mEq/L    CO2 28  19 - 32 mEq/L    Glucose, Bld 100 (*) 70 - 99 mg/dL    BUN 25 (*) 6 - 23 mg/dL    Creatinine, Ser 1.61 (*) 0.50 - 1.35 mg/dL    Calcium 9.7  8.4 - 09.6 mg/dL    GFR calc non Af Amer 42 (*) >90 mL/min    GFR calc Af Amer 48 (*) >90 mL/min   CBC     Status: Abnormal   Collection Time   01/23/12 10:04 AM      Component Value Range Comment   WBC 13.5 (*)  4.0 - 10.5 K/uL    RBC 5.34  4.22 - 5.81 MIL/uL    Hemoglobin 16.5  13.0 - 17.0 g/dL    HCT 78.4  69.6 - 29.5 %    MCV 92.9  78.0 - 100.0 fL    MCH 30.9  26.0 - 34.0 pg    MCHC 33.3  30.0 - 36.0 g/dL    RDW 28.4  13.2 - 44.0 %    Platelets 313  150 - 400 K/uL   SURGICAL PCR SCREEN     Status: Normal   Collection Time   01/23/12 10:06 AM      Component Value Range Comment   MRSA, PCR NEGATIVE  NEGATIVE    Staphylococcus aureus NEGATIVE  NEGATIVE    Dg Chest 2 View  01/23/2012  *RADIOLOGY REPORT*  Clinical Data: Preop radiograph.  Hypertension.  CHEST - 2 VIEW  Comparison: 09/28/2009  Findings: Heart size is normal.  No pleural effusion or edema identified.  There is asymmetric elevation of the right hemidiaphragm.  No airspace consolidation.  Multilevel degenerative disc disease noted within the thoracic spine.  There is a moderate thoracic kyphosis deformity.  IMPRESSION:  1.  No acute cardiopulmonary abnormalities.   Original Report Authenticated By: Rosealee Albee, M.D.     Review of Systems  Musculoskeletal: Positive for back pain.  Neurological: Positive for focal weakness.  All other systems reviewed and are negative.    Blood pressure 121/83, pulse 79, temperature 97.7 F (36.5 C), temperature source  Oral, resp. rate 18, SpO2 94.00%. Physical Exam  Constitutional: He is oriented to person, place, and time. He appears well-developed and well-nourished. He appears distressed.  HENT:  Head: Normocephalic and atraumatic.  Mouth/Throat: Oropharynx is clear and moist.  Eyes: Conjunctivae normal and EOM are normal. Pupils are equal, round, and reactive to light.  Neck: Normal range of motion. Neck supple.  Cardiovascular: Normal rate, regular rhythm and normal heart sounds.   Respiratory: Effort normal and breath sounds normal.  GI: Soft.  Musculoskeletal: Normal range of motion.  Neurological: He is alert and oriented to person, place, and time. He has normal reflexes. No cranial nerve deficit or sensory deficit. He exhibits normal muscle tone. He displays a negative Romberg sign. Coordination normal. He displays no Babinski's sign on the right side. He displays no Babinski's sign on the left side.  Reflex Scores:      Tricep reflexes are 2+ on the right side and 2+ on the left side.      Bicep reflexes are 2+ on the right side and 2+ on the left side.      Brachioradialis reflexes are 2+ on the right side and 2+ on the left side.      Patellar reflexes are 2+ on the right side and 2+ on the left side.      Achilles reflexes are 2+ on the right side and 2+ on the left side.      Weakness in the right hip flexors, positive straight leg raising on right  Skin: Skin is warm and dry.  Psychiatric: He has a normal mood and affect. His behavior is normal. Judgment and thought content normal.     Assessment/Plan He appears to have a fairly large herniated disc on the right side at L3-4 in the far lateral position.  Secondary to the amount of pain he has and more importantly the weakness he has in the hip flexors, I believe he should undergo operative decompression and  he has agreed.  We will do this on this coming Friday.  Risks and benefits were explained.  He understands and wishes to proceed.      Jewett Mcgann L 01/24/2012, 4:38 PM

## 2012-01-24 NOTE — Transfer of Care (Signed)
Immediate Anesthesia Transfer of Care Note  Patient: Kyle [name]  Procedure(s) Performed: Procedure(s) (LRB) with comments: LUMBAR LAMINECTOMY/DECOMPRESSION MICRODISCECTOMY 1 LEVEL (Right) - RIGHT Lumbar Three-Four far lateral diskectomy  Patient Location: PACU  Anesthesia Type: General  Level of Consciousness: sedated  Airway & Oxygen Therapy: Patient Spontanous Breathing and Patient connected to face mask oxygen  Post-op Assessment: Report given to PACU RN and Post -op Vital signs reviewed and stable  Post vital signs: Reviewed and stable  Complications: No apparent anesthesia complications

## 2012-01-25 NOTE — Discharge Summary (Signed)
  Physician Discharge Summary  Patient ID: Kyle [name] MRN: [id number] DOB/AGE: 1932/01/25 78 y.o.  Admit date: 01/24/2012 Discharge date: 01/25/2012  Admission Diagnoses: Lumbar spondylosis  Discharge Diagnoses: Same Principal Problem:  *HNP (herniated nucleus pulposus), lumbar   Discharged Condition: good  Hospital Course: Patient was admitted hospital underwent lumbar laminectomy postoperatively patient did very well recovered in the floor on the floor patient is emanating voiding spontaneously tolerating regular diet pain was well-controlled was still be discharged home posterior day 1.  Consults: Significant Diagnostic Studies: Treatments: Lumbar laminectomy Discharge Exam: Blood pressure 102/58, pulse 60, temperature 98.1 F (36.7 C), temperature source Oral, resp. rate 18, height 5\' 9"  (1.753 m), weight 91.8 kg (202 lb 6.1 oz), SpO2 95.00%. Strength out of 5 wound clean and dry  Disposition: Home  Discharge Orders    Future Appointments: Provider: Department: Dept Phone: Center:   04/01/2012 8:00 AM Karie Schwalbe, MD [facility] [phone] [id number]       Medication List     As of 01/25/2012 10:10 AM    TAKE these medications         aspirin 81 MG tablet   Take 81 mg by mouth daily.      cyclobenzaprine 10 MG tablet   Commonly known as: FLEXERIL   Take 1 tablet (10 mg total) by mouth 3 (three) times daily as needed for muscle spasms.      fish oil-omega-3 fatty acids 1000 MG capsule   Take 1 g by mouth daily.      losartan-hydrochlorothiazide 50-12.5 MG per tablet   Commonly known as: HYZAAR   Take 1 tablet by mouth daily.      Red Yeast Rice 600 MG Caps   Take 1 capsule by mouth daily.      traMADol 50 MG tablet   Commonly known as: ULTRAM   Take 1-2 tablets (50-100 mg total) by mouth every 8 (eight) hours as needed.      vitamin B-12 50 MCG tablet   Commonly known as: CYANOCOBALAMIN   Take 50 mcg by mouth daily.      Vitamin D  1000 UNITS capsule   Take 1,000 Units by mouth daily.           Follow-up Information    Follow up with CABBELL,KYLE L, MD. In 4 weeks. (call to make appt)    Contact information:   [address] [place] [address] [phone]          Signed: Chanika Byland P 01/25/2012, 10:10 AM

## 2012-01-25 NOTE — Progress Notes (Signed)
Subjective: Patient reports Is doing well no leg pain very minimal back soreness I. his angling and voiding he stable for this or discharge  Objective: Vital signs in last 24 hours: Temp:  [97 F (36.1 C)-98.4 F (36.9 C)] 98.1 F (36.7 C) (10/05 0907) Pulse Rate:  [60-79] 60  (10/05 0907) Resp:  [15-21] 18  (10/05 0907) BP: (88-163)/(46-96) 102/58 mmHg (10/05 0907) SpO2:  [90 %-100 %] 95 % (10/05 0907) Weight:  [91.8 kg (202 lb 6.1 oz)] 91.8 kg (202 lb 6.1 oz) (10/05 0600)  Intake/Output from previous day: 10/04 0701 - 10/05 0700 In: 1823 [P.O.:120; I.V.:1703] Out: 25 [Blood:25] Intake/Output this shift:    Strength out of 5 wound clean and dry  Lab Results:  Basename 01/23/12 1004  WBC 13.5*  HGB 16.5  HCT 49.6  PLT 313   BMET  Basename 01/23/12 1004  NA 140  K 3.9  CL 104  CO2 28  GLUCOSE 100*  BUN 25*  CREATININE 1.53*  CALCIUM 9.7    Studies/Results: Dg Chest 2 View  01/23/2012  *RADIOLOGY REPORT*  Clinical Data: Preop radiograph.  Hypertension.  CHEST - 2 VIEW  Comparison: 09/28/2009  Findings: Heart size is normal.  No pleural effusion or edema identified.  There is asymmetric elevation of the right hemidiaphragm.  No airspace consolidation.  Multilevel degenerative disc disease noted within the thoracic spine.  There is a moderate thoracic kyphosis deformity.  IMPRESSION:  1.  No acute cardiopulmonary abnormalities.   Original Report Authenticated By: Rosealee Albee, M.D.    Dg Lumbar Spine 2-3 Views  01/24/2012  *RADIOLOGY REPORT*  Clinical Data: Intraoperative localization.  LUMBAR SPINE - 2-3 VIEW  Comparison: Lumbar spine MRI 1920 11/2011.  Findings: There is a spinal needle at the level of the L4 spinous process. The second film shows a surgical instrument marking the L3- 4 level.  The third film also demonstrates a surgical instrument marking the L3-4 level.  IMPRESSION: L3-4 marked intraoperatively.   Original Report Authenticated By: P. Loralie Champagne, M.D.     Assessment/Plan: Discharge home  LOS: 1 day     Kyle [name] P 01/25/2012, 10:08 AM

## 2012-01-25 NOTE — Progress Notes (Signed)
Pt's wife had Rx's for Norco and flexeril at discharge  Minor, Yvette Rack

## 2012-01-27 ENCOUNTER — Encounter (HOSPITAL_COMMUNITY): Payer: Self-pay | Admitting: Neurosurgery

## 2012-02-27 DIAGNOSIS — Z23 Encounter for immunization: Secondary | ICD-10-CM | POA: Diagnosis not present

## 2012-04-01 ENCOUNTER — Ambulatory Visit (INDEPENDENT_AMBULATORY_CARE_PROVIDER_SITE_OTHER): Payer: Medicare Other | Admitting: Internal Medicine

## 2012-04-01 ENCOUNTER — Encounter: Payer: Self-pay | Admitting: Internal Medicine

## 2012-04-01 VITALS — BP 128/70 | HR 81 | Temp 98.2°F | Ht 69.0 in | Wt 192.0 lb

## 2012-04-01 DIAGNOSIS — I10 Essential (primary) hypertension: Secondary | ICD-10-CM

## 2012-04-01 DIAGNOSIS — G3184 Mild cognitive impairment, so stated: Secondary | ICD-10-CM | POA: Diagnosis not present

## 2012-04-01 DIAGNOSIS — Z1331 Encounter for screening for depression: Secondary | ICD-10-CM | POA: Diagnosis not present

## 2012-04-01 DIAGNOSIS — M159 Polyosteoarthritis, unspecified: Secondary | ICD-10-CM

## 2012-04-01 NOTE — Assessment & Plan Note (Signed)
Rues the limitations he has but remains fairly active Uses the tramadol 3-4 per day

## 2012-04-01 NOTE — Progress Notes (Signed)
Subjective:    Patient ID: Kyle [name], male    DOB: [birth year], 78 y.o.   MRN: [id number]  HPI Does feel much better since the lumbar laminectomy Still has some numbness in right leg Still has generalized arthritis---wrist, knees, back Uses the tramadol regularly  Balance seems fair Has to be very careful Uses cane only occasionally  Trying to get out on tractor occasionally Son takes care of farm mostly  No chest pain No SOB No palpitations Mild dizziness if he gets up quickly---no syncope. Generally doesn't have to sit again  Current Outpatient Prescriptions on File Prior to Visit  Medication Sig Dispense Refill  . aspirin 81 MG tablet Take 81 mg by mouth daily.        . Cholecalciferol (VITAMIN D) 1000 UNITS capsule Take 1,000 Units by mouth daily.        . cyclobenzaprine (FLEXERIL) 10 MG tablet Take 1 tablet (10 mg total) by mouth 3 (three) times daily as needed for muscle spasms.  45 tablet  0  . fish oil-omega-3 fatty acids 1000 MG capsule Take 1 g by mouth daily.      [name] losartan-hydrochlorothiazide (HYZAAR) 50-12.5 MG per tablet Take 1 tablet by mouth daily.  90 tablet  3  . Red Yeast Rice 600 MG CAPS Take 1 capsule by mouth daily.       . traMADol (ULTRAM) 50 MG tablet Take 1-2 tablets (50-100 mg total) by mouth every 8 (eight) hours as needed.  300 tablet  1  . vitamin B-12 (CYANOCOBALAMIN) 50 MCG tablet Take 50 mcg by mouth daily.        Allergies  Allergen Reactions  . Simvastatin     REACTION: myalgias    Past Medical History  Diagnosis Date  . History of prostate cancer   . Hyperlipidemia   . Hypertension   . Arthritis   . Impaired fasting glucose   . Detached retina     right  . Pneumonia   . H/O dizziness   . Stroke     x 3  . Urgency of urination   . Cancer     prostate CA  . GERD (gastroesophageal reflux disease)     Past Surgical History  Procedure Date  . Hernia repair   . Prostate surgery   . Tonsillectomy   . Eye surgery   .  Colonoscopy w/ polypectomy   . Lumbar laminectomy/decompression microdiscectomy 01/24/2012    Procedure: LUMBAR LAMINECTOMY/DECOMPRESSION MICRODISCECTOMY 1 LEVEL;  Surgeon: Carmela Hurt, MD;  Location: [facility];  Service: Neurosurgery;  Laterality: Right;  RIGHT Lumbar Three-Four far lateral diskectomy    Family History  Problem Relation Age of Onset  . Heart disease Mother   . Coronary artery disease Mother   . Heart disease Father   . Coronary artery disease Father   . Heart disease Brother   . Diabetes Neg Hx   . Heart disease Brother   . Cancer Brother     prostate    History   Social History  . Marital Status: Married    Spouse Name: N/A    Number of Children: 1  . Years of Education: N/A   Occupational History  . retired    Social History Main Topics  . Smoking status: Never Smoker   . Smokeless tobacco: Former Neurosurgeon    Types: Chew    Quit date: 04/22/2004  . Alcohol Use: No  . Drug Use: No  . Sexually Active:  Not on file   Other Topics Concern  . Not on file   Social History Narrative   Has living willWife is health care POAwould accept CPR but no prolonged machines. Not sure about G-tube(discussed 10/29/05)   Review of Systems Appetite is fine now Has gained back the weight he lost with the sciatica Poor sleep as usual. Frequent awakening. No serious daytime somnolence     Objective:   Physical Exam  Constitutional: He appears well-developed and well-nourished. No distress.  Neck: Normal range of motion. Neck supple. No thyromegaly present.  Cardiovascular: Normal rate, regular rhythm and normal heart sounds.  Exam reveals no gallop.   No murmur heard. Pulmonary/Chest: Effort normal and breath sounds normal. No respiratory distress. He has no wheezes. He has no rales.  Musculoskeletal: He exhibits no edema.  Lymphadenopathy:    He has no cervical adenopathy.  Psychiatric: He has a normal mood and affect. His behavior is normal.           Assessment & Plan:

## 2012-04-01 NOTE — Assessment & Plan Note (Signed)
BP Readings from Last 3 Encounters:  04/01/12 128/70  01/25/12 102/58  01/25/12 102/58   Good control No changes

## 2012-04-01 NOTE — Assessment & Plan Note (Signed)
Mild memory issues that have not progressed

## 2012-05-18 ENCOUNTER — Encounter: Payer: Self-pay | Admitting: Internal Medicine

## 2012-05-18 ENCOUNTER — Ambulatory Visit (INDEPENDENT_AMBULATORY_CARE_PROVIDER_SITE_OTHER): Payer: Medicare Other | Admitting: Internal Medicine

## 2012-05-18 VITALS — BP 144/85 | HR 81 | Temp 98.2°F | Wt 185.0 lb

## 2012-05-18 DIAGNOSIS — S40029A Contusion of unspecified upper arm, initial encounter: Secondary | ICD-10-CM | POA: Diagnosis not present

## 2012-05-18 NOTE — Assessment & Plan Note (Signed)
More like ecchymosis--mostly likely from small muscle damage with bleeding No obvious joint findings No secondary infection  Discussed that he probably had some unknown damage The bruise may migrate down some more No Rx needed

## 2012-05-18 NOTE — Progress Notes (Signed)
Subjective:    Patient ID: Kyle [name], male    DOB: Aug 24, 1931, 79 y.o.   MRN: [id number]  HPI Got up last week---noticed 2 spots on left arm Increasing soreness Then suddenly had large bruise----purplish discoloration on flexor arm Still sore Not hot  No known trauma Shoulder hurts too ROM seems okay---but hurts with abduction  Has not tried any meds for this  Current Outpatient Prescriptions on File Prior to Visit  Medication Sig Dispense Refill  . aspirin 81 MG tablet Take 81 mg by mouth daily.        . Cholecalciferol (VITAMIN D) 1000 UNITS capsule Take 1,000 Units by mouth daily.        . cyclobenzaprine (FLEXERIL) 10 MG tablet Take 1 tablet (10 mg total) by mouth 3 (three) times daily as needed for muscle spasms.  45 tablet  0  . fish oil-omega-3 fatty acids 1000 MG capsule Take 1 g by mouth daily.      [name] losartan-hydrochlorothiazide (HYZAAR) 50-12.5 MG per tablet Take 1 tablet by mouth daily.  90 tablet  3  . Red Yeast Rice 600 MG CAPS Take 1 capsule by mouth daily.       . traMADol (ULTRAM) 50 MG tablet Take 1-2 tablets (50-100 mg total) by mouth every 8 (eight) hours as needed.  300 tablet  1  . vitamin B-12 (CYANOCOBALAMIN) 50 MCG tablet Take 50 mcg by mouth daily.        Allergies  Allergen Reactions  . Simvastatin     REACTION: myalgias    Past Medical History  Diagnosis Date  . History of prostate cancer   . Hyperlipidemia   . Hypertension   . Arthritis   . Impaired fasting glucose   . Detached retina     right  . Pneumonia   . H/O dizziness   . Stroke     x 3  . Urgency of urination   . Cancer     prostate CA  . GERD (gastroesophageal reflux disease)     Past Surgical History  Procedure Date  . Hernia repair   . Prostate surgery   . Tonsillectomy   . Eye surgery   . Colonoscopy w/ polypectomy   . Lumbar laminectomy/decompression microdiscectomy 01/24/2012    Procedure: LUMBAR LAMINECTOMY/DECOMPRESSION MICRODISCECTOMY 1 LEVEL;  Surgeon:  Carmela Hurt, MD;  Location: [facility];  Service: Neurosurgery;  Laterality: Right;  RIGHT Lumbar Three-Four far lateral diskectomy    Family History  Problem Relation Age of Onset  . Heart disease Mother   . Coronary artery disease Mother   . Heart disease Father   . Coronary artery disease Father   . Heart disease Brother   . Diabetes Neg Hx   . Heart disease Brother   . Cancer Brother     prostate    History   Social History  . Marital Status: Married    Spouse Name: N/A    Number of Children: 1  . Years of Education: N/A   Occupational History  . retired    Social History Main Topics  . Smoking status: Never Smoker   . Smokeless tobacco: Former Neurosurgeon    Types: Chew    Quit date: 04/22/2004  . Alcohol Use: No  . Drug Use: No  . Sexually Active: Not on file   Other Topics Concern  . Not on file   Social History Narrative   Has living willWife is health care POAwould accept CPR  but no prolonged machines. Not sure about G-tube(discussed 10/29/05)   Review of Systems No fever Some burning in stomach---uses OTC acid blockers prn     Objective:   Physical Exam  Constitutional: He appears well-developed and well-nourished. No distress.  Musculoskeletal:       Fairly normal ROM at left shoulder. Very slight crepitus Normal ROM at left elbow  Extensive purplish ecchymosis from 1/3rd down left arm to almost the elbow (has progressed some since 3 days ago). Sensitive but not clear tenderness. No mass or muscle roll up          Assessment & Plan:

## 2012-06-24 DIAGNOSIS — H33009 Unspecified retinal detachment with retinal break, unspecified eye: Secondary | ICD-10-CM | POA: Diagnosis not present

## 2012-06-24 DIAGNOSIS — Z961 Presence of intraocular lens: Secondary | ICD-10-CM | POA: Diagnosis not present

## 2012-06-24 DIAGNOSIS — H3520 Other non-diabetic proliferative retinopathy, unspecified eye: Secondary | ICD-10-CM | POA: Diagnosis not present

## 2012-09-30 ENCOUNTER — Ambulatory Visit (INDEPENDENT_AMBULATORY_CARE_PROVIDER_SITE_OTHER): Payer: Medicare Other | Admitting: Internal Medicine

## 2012-09-30 ENCOUNTER — Encounter: Payer: Self-pay | Admitting: Internal Medicine

## 2012-09-30 VITALS — BP 120/70 | HR 72 | Temp 97.8°F | Wt 194.0 lb

## 2012-09-30 DIAGNOSIS — G3184 Mild cognitive impairment, so stated: Secondary | ICD-10-CM

## 2012-09-30 DIAGNOSIS — I639 Cerebral infarction, unspecified: Secondary | ICD-10-CM

## 2012-09-30 DIAGNOSIS — I10 Essential (primary) hypertension: Secondary | ICD-10-CM

## 2012-09-30 DIAGNOSIS — I6350 Cerebral infarction due to unspecified occlusion or stenosis of unspecified cerebral artery: Secondary | ICD-10-CM | POA: Diagnosis not present

## 2012-09-30 DIAGNOSIS — M159 Polyosteoarthritis, unspecified: Secondary | ICD-10-CM | POA: Diagnosis not present

## 2012-09-30 MED ORDER — TRAMADOL HCL 50 MG PO TABS: 50.0000 mg | ORAL_TABLET | Freq: Three times a day (TID) | ORAL | Status: DC | PRN

## 2012-09-30 NOTE — Assessment & Plan Note (Signed)
Will renew the tramadol

## 2012-09-30 NOTE — Progress Notes (Signed)
Subjective:    Patient ID: Kyle [name], male    DOB: 10-21-1931, 79 y.o.   MRN: [id number]  HPI Here for follow up Still having arthritis pain Sciatic type pain due to broken disc ---despite the surgery Awakens in pain---gets a little better as the day goes on Ran out of tramadol a month ago--it does help for hands and shoulders  Memory still not great but no change No apraxia Seems to have intact executive function Still does work on his tractor  No chest pain Has DOE--stable No significant dizziness or syncope---mild orthostatic dizziness at times (since stroke) Still with balance problems--uses stick on uneven ground  Current Outpatient Prescriptions on File Prior to Visit  Medication Sig Dispense Refill  . aspirin 81 MG tablet Take 81 mg by mouth daily.        . Cholecalciferol (VITAMIN D) 1000 UNITS capsule Take 1,000 Units by mouth daily.        . cyclobenzaprine (FLEXERIL) 10 MG tablet Take 1 tablet (10 mg total) by mouth 3 (three) times daily as needed for muscle spasms.  45 tablet  0  . fish oil-omega-3 fatty acids 1000 MG capsule Take 1 g by mouth daily.      [name] losartan-hydrochlorothiazide (HYZAAR) 50-12.5 MG per tablet Take 1 tablet by mouth daily.  90 tablet  3  . Red Yeast Rice 600 MG CAPS Take 1 capsule by mouth daily.       . traMADol (ULTRAM) 50 MG tablet Take 1-2 tablets (50-100 mg total) by mouth every 8 (eight) hours as needed.  300 tablet  1  . vitamin B-12 (CYANOCOBALAMIN) 50 MCG tablet Take 50 mcg by mouth daily.       No current facility-administered medications on file prior to visit.    Allergies  Allergen Reactions  . Simvastatin     REACTION: myalgias    Past Medical History  Diagnosis Date  . History of prostate cancer   . Hyperlipidemia   . Hypertension   . Arthritis   . Impaired fasting glucose   . Detached retina     right  . Pneumonia   . H/O dizziness   . Stroke     x 3  . Urgency of urination   . Cancer     prostate CA  .  GERD (gastroesophageal reflux disease)     Past Surgical History  Procedure Laterality Date  . Hernia repair    . Prostate surgery    . Tonsillectomy    . Eye surgery    . Colonoscopy w/ polypectomy    . Lumbar laminectomy/decompression microdiscectomy  01/24/2012    Procedure: LUMBAR LAMINECTOMY/DECOMPRESSION MICRODISCECTOMY 1 LEVEL;  Surgeon: Carmela Hurt, MD;  Location: [facility];  Service: Neurosurgery;  Laterality: Right;  RIGHT Lumbar Three-Four far lateral diskectomy    Family History  Problem Relation Age of Onset  . Heart disease Mother   . Coronary artery disease Mother   . Heart disease Father   . Coronary artery disease Father   . Heart disease Brother   . Diabetes Neg Hx   . Heart disease Brother   . Cancer Brother     prostate    History   Social History  . Marital Status: Married    Spouse Name: N/A    Number of Children: 1  . Years of Education: N/A   Occupational History  . retired    Social History Main Topics  . Smoking status: Never  Smoker   . Smokeless tobacco: Former Neurosurgeon    Types: Chew    Quit date: 04/22/2004  . Alcohol Use: No  . Drug Use: No  . Sexually Active: Not on file   Other Topics Concern  . Not on file   Social History Narrative   Has living will   Wife is health care POA   would accept CPR but no prolonged machines. Not sure about G-tube   (discussed 10/29/05)         Review of Systems Sleep is still not great---only a few hours at a time No excessive daytime somnolence Appetite is good Weight stable    Objective:   Physical Exam  Constitutional: He appears well-developed and well-nourished. No distress.  Neck: Normal range of motion. Neck supple. No thyromegaly present.  Cardiovascular: Normal rate, regular rhythm and normal heart sounds.  Exam reveals no gallop.   No murmur heard. Pulmonary/Chest: Effort normal. No respiratory distress. He has no wheezes. He has rales.  Very fine bibasilar crackles   Musculoskeletal: He exhibits no edema and no tenderness.  Lymphadenopathy:    He has no cervical adenopathy.  Psychiatric: He has a normal mood and affect. His behavior is normal.          Assessment & Plan:

## 2012-09-30 NOTE — Assessment & Plan Note (Signed)
Still some mild balance issues

## 2012-09-30 NOTE — Assessment & Plan Note (Signed)
Still just memory--no functional deficits Probably vascular etiology

## 2012-09-30 NOTE — Assessment & Plan Note (Signed)
BP Readings from Last 3 Encounters:  09/30/12 120/70  05/18/12 144/85  04/01/12 128/70   Good control No changes needed

## 2012-10-05 DIAGNOSIS — H9120 Sudden idiopathic hearing loss, unspecified ear: Secondary | ICD-10-CM | POA: Diagnosis not present

## 2012-10-05 DIAGNOSIS — H905 Unspecified sensorineural hearing loss: Secondary | ICD-10-CM | POA: Diagnosis not present

## 2012-10-05 DIAGNOSIS — H6120 Impacted cerumen, unspecified ear: Secondary | ICD-10-CM | POA: Diagnosis not present

## 2012-11-09 ENCOUNTER — Other Ambulatory Visit: Payer: Self-pay | Admitting: *Deleted

## 2012-11-09 NOTE — Telephone Encounter (Signed)
Last filled 09/30/12 

## 2012-11-10 MED ORDER — TRAMADOL HCL 50 MG PO TABS: 50.0000 mg | ORAL_TABLET | Freq: Three times a day (TID) | ORAL | Status: DC | PRN

## 2012-11-10 NOTE — Telephone Encounter (Signed)
rx sent to pharmacy by e-script  

## 2012-11-10 NOTE — Telephone Encounter (Signed)
Okay #90 x 0 

## 2012-11-20 ENCOUNTER — Other Ambulatory Visit: Payer: Self-pay | Admitting: Internal Medicine

## 2012-12-14 ENCOUNTER — Other Ambulatory Visit: Payer: Self-pay | Admitting: *Deleted

## 2012-12-14 MED ORDER — TRAMADOL HCL 50 MG PO TABS: 50.0000 mg | ORAL_TABLET | Freq: Three times a day (TID) | ORAL | Status: DC | PRN

## 2012-12-14 NOTE — Telephone Encounter (Signed)
rx called into pharmacy

## 2012-12-14 NOTE — Telephone Encounter (Signed)
Okay #90 x 0 

## 2013-01-20 ENCOUNTER — Other Ambulatory Visit: Payer: Self-pay | Admitting: *Deleted

## 2013-01-20 DIAGNOSIS — Z23 Encounter for immunization: Secondary | ICD-10-CM | POA: Diagnosis not present

## 2013-01-20 NOTE — Telephone Encounter (Signed)
Last filled 11/10/2012

## 2013-01-20 NOTE — Telephone Encounter (Signed)
Okay #90 x 0 

## 2013-01-21 MED ORDER — TRAMADOL HCL 50 MG PO TABS: 50.0000 mg | ORAL_TABLET | Freq: Three times a day (TID) | ORAL | Status: DC | PRN

## 2013-01-21 NOTE — Telephone Encounter (Signed)
rx called into pharmacy

## 2013-02-13 ENCOUNTER — Other Ambulatory Visit: Payer: Self-pay | Admitting: Internal Medicine

## 2013-02-15 ENCOUNTER — Other Ambulatory Visit: Payer: Self-pay | Admitting: Internal Medicine

## 2013-02-15 NOTE — Telephone Encounter (Signed)
rx called into pharmacy

## 2013-02-15 NOTE — Telephone Encounter (Signed)
Okay #90 x 0 

## 2013-02-15 NOTE — Telephone Encounter (Signed)
Last filled 01/20/2013

## 2013-03-16 ENCOUNTER — Other Ambulatory Visit: Payer: Self-pay | Admitting: Internal Medicine

## 2013-03-16 NOTE — Telephone Encounter (Signed)
Last filled 02/15/13

## 2013-03-16 NOTE — Telephone Encounter (Signed)
Okay #90 x 0 

## 2013-03-16 NOTE — Telephone Encounter (Signed)
rx called into pharmacy

## 2013-04-08 ENCOUNTER — Other Ambulatory Visit: Payer: Self-pay | Admitting: Internal Medicine

## 2013-04-09 NOTE — Telephone Encounter (Signed)
Okay #90 x 0 

## 2013-04-09 NOTE — Telephone Encounter (Signed)
Last filled 03/16/13 

## 2013-04-09 NOTE — Telephone Encounter (Signed)
rx called into pharmacy

## 2013-04-16 ENCOUNTER — Ambulatory Visit (INDEPENDENT_AMBULATORY_CARE_PROVIDER_SITE_OTHER): Payer: Medicare Other | Admitting: Internal Medicine

## 2013-04-16 ENCOUNTER — Encounter: Payer: Self-pay | Admitting: Internal Medicine

## 2013-04-16 VITALS — BP 130/70 | HR 79 | Temp 98.5°F | Wt 196.0 lb

## 2013-04-16 DIAGNOSIS — E785 Hyperlipidemia, unspecified: Secondary | ICD-10-CM

## 2013-04-16 DIAGNOSIS — I10 Essential (primary) hypertension: Secondary | ICD-10-CM

## 2013-04-16 DIAGNOSIS — I639 Cerebral infarction, unspecified: Secondary | ICD-10-CM

## 2013-04-16 DIAGNOSIS — Z0000 Encounter for general adult medical examination without abnormal findings: Secondary | ICD-10-CM | POA: Diagnosis not present

## 2013-04-16 DIAGNOSIS — Z23 Encounter for immunization: Secondary | ICD-10-CM | POA: Diagnosis not present

## 2013-04-16 DIAGNOSIS — Z8546 Personal history of malignant neoplasm of prostate: Secondary | ICD-10-CM

## 2013-04-16 DIAGNOSIS — I6350 Cerebral infarction due to unspecified occlusion or stenosis of unspecified cerebral artery: Secondary | ICD-10-CM

## 2013-04-16 DIAGNOSIS — J019 Acute sinusitis, unspecified: Secondary | ICD-10-CM | POA: Diagnosis not present

## 2013-04-16 LAB — HEPATIC FUNCTION PANEL
ALT: 16 U/L (ref 0–53)
AST: 15 U/L (ref 0–37)
Albumin: 3.5 g/dL (ref 3.5–5.2)
Alkaline Phosphatase: 61 U/L (ref 39–117)
Bilirubin, Direct: 0.1 mg/dL (ref 0.0–0.3)
Total Bilirubin: 0.6 mg/dL (ref 0.3–1.2)
Total Protein: 6.9 g/dL (ref 6.0–8.3)

## 2013-04-16 LAB — CBC WITH DIFFERENTIAL/PLATELET
Basophils Absolute: 0 10*3/uL (ref 0.0–0.1)
Basophils Relative: 0.3 % (ref 0.0–3.0)
Eosinophils Absolute: 0.1 10*3/uL (ref 0.0–0.7)
Eosinophils Relative: 1.1 % (ref 0.0–5.0)
HCT: 44.5 % (ref 39.0–52.0)
Hemoglobin: 14.9 g/dL (ref 13.0–17.0)
Lymphocytes Relative: 27.1 % (ref 12.0–46.0)
Lymphs Abs: 3 10*3/uL (ref 0.7–4.0)
MCHC: 33.5 g/dL (ref 30.0–36.0)
MCV: 91.9 fl (ref 78.0–100.0)
Monocytes Absolute: 1.1 10*3/uL — ABNORMAL HIGH (ref 0.1–1.0)
Monocytes Relative: 10.1 % (ref 3.0–12.0)
Neutro Abs: 6.8 10*3/uL (ref 1.4–7.7)
Neutrophils Relative %: 61.4 % (ref 43.0–77.0)
Platelets: 256 10*3/uL (ref 150.0–400.0)
RBC: 4.83 Mil/uL (ref 4.22–5.81)
RDW: 14.4 % (ref 11.5–14.6)
WBC: 11 10*3/uL — ABNORMAL HIGH (ref 4.5–10.5)

## 2013-04-16 LAB — LIPID PANEL
Cholesterol: 194 mg/dL (ref 0–200)
HDL: 44.4 mg/dL (ref >39.00)
LDL Cholesterol: 128 mg/dL — ABNORMAL HIGH (ref 0–99)
Total CHOL/HDL Ratio: 4
Triglycerides: 110 mg/dL (ref 0.0–149.0)
VLDL: 22 mg/dL (ref 0.0–40.0)

## 2013-04-16 LAB — T4, FREE: Free T4: 0.96 ng/dL (ref 0.60–1.60)

## 2013-04-16 LAB — PSA: PSA: 0.01 ng/mL — ABNORMAL LOW (ref 0.10–4.00)

## 2013-04-16 LAB — BASIC METABOLIC PANEL
BUN: 15 mg/dL (ref 6–23)
CO2: 30 mEq/L (ref 19–32)
Calcium: 9.6 mg/dL (ref 8.4–10.5)
Chloride: 101 mEq/L (ref 96–112)
Creatinine, Ser: 1.4 mg/dL (ref 0.4–1.5)
GFR: 54.06 mL/min — ABNORMAL LOW (ref >60.00)
Glucose, Bld: 97 mg/dL (ref 70–99)
Potassium: 3.5 mEq/L (ref 3.5–5.1)
Sodium: 139 mEq/L (ref 135–145)

## 2013-04-16 LAB — TSH: TSH: 3.3 u[IU]/mL (ref 0.35–5.50)

## 2013-04-16 MED ORDER — AMOXICILLIN-POT CLAVULANATE 875-125 MG PO TABS: 1.0000 | ORAL_TABLET | Freq: Two times a day (BID) | ORAL | Status: DC

## 2013-04-16 NOTE — Progress Notes (Signed)
Subjective:    Patient ID: Kyle [name], male    DOB: 05-06-1931, 79 y.o.   MRN: [id number]  HPI Here for Medicare wellness and follow up 1 fall without injury No depression or anhedonia Independent in instrumental ADLs No vision or hearing loss No real exercise No tobacco or alcohol Reviewed advanced directives Wellness form reviewed  Has had respiratory illness for a week No fever but felt hot and cold No SOB Rhinorrhea and congestion Cough with some mucus. "Gobs" of stuff out of nose--yellow Seemed to be improving but then worsened again  Still with arthritis pain Sciatic nerve also---despite disc repair last year Uses the tramadol and rare goody powder Most is 3 tramadol a day---this does "ease it"  Still has some balance problems---just has to be careful Uses cane at times 1 fall off tractor when getting off--no injury  Memory seems the same Still works on farm--hasn't given up any activities Never gets lost in car--but has to think about it at times Doesn't repeat himself  Only on red yeast rice and fish oil for the cholesterol Had a problem with zocor Unless cholesterol way up, he prefers no new meds  No chest pain No SOB----stamina not as good as years ago. Now major change in past year Gets dizzy upon getting up---then stabilizes. No syncope  Current Outpatient Prescriptions on File Prior to Visit  Medication Sig Dispense Refill  . aspirin 81 MG tablet Take 81 mg by mouth daily.        . Cholecalciferol (VITAMIN D) 1000 UNITS capsule Take 1,000 Units by mouth daily.        . fish oil-omega-3 fatty acids 1000 MG capsule Take 1 g by mouth daily.      [name] losartan-hydrochlorothiazide (HYZAAR) 50-12.5 MG per tablet TAKE 1 TABLET BY MOUTH TWICE A DAY  90 tablet  3  . Red Yeast Rice 600 MG CAPS Take 1 capsule by mouth daily.       . traMADol (ULTRAM) 50 MG tablet TAKE 1 TABLET 3 TIMES A DAY  90 tablet  0  . vitamin B-12 (CYANOCOBALAMIN) 50 MCG tablet Take 50  mcg by mouth daily.       No current facility-administered medications on file prior to visit.    Allergies  Allergen Reactions  . Simvastatin     REACTION: myalgias    Past Medical History  Diagnosis Date  . History of prostate cancer   . Hyperlipidemia   . Hypertension   . Arthritis   . Impaired fasting glucose   . Detached retina     right  . Pneumonia   . H/O dizziness   . Stroke     x 3  . Urgency of urination   . Cancer     prostate CA  . GERD (gastroesophageal reflux disease)     Past Surgical History  Procedure Laterality Date  . Hernia repair    . Prostate surgery    . Tonsillectomy    . Eye surgery    . Colonoscopy w/ polypectomy    . Lumbar laminectomy/decompression microdiscectomy  01/24/2012    Procedure: LUMBAR LAMINECTOMY/DECOMPRESSION MICRODISCECTOMY 1 LEVEL;  Surgeon: Carmela Hurt, MD;  Location: [facility];  Service: Neurosurgery;  Laterality: Right;  RIGHT Lumbar Three-Four far lateral diskectomy    Family History  Problem Relation Age of Onset  . Heart disease Mother   . Coronary artery disease Mother   . Heart disease Father   .  Coronary artery disease Father   . Heart disease Brother   . Diabetes Neg Hx   . Heart disease Brother   . Cancer Brother     prostate    History   Social History  . Marital Status: Married    Spouse Name: N/A    Number of Children: 1  . Years of Education: N/A   Occupational History  . retired    Social History Main Topics  . Smoking status: Never Smoker   . Smokeless tobacco: Former Neurosurgeon    Types: Chew    Quit date: 04/22/2004  . Alcohol Use: No  . Drug Use: No  . Sexual Activity: Not on file   Other Topics Concern  . Not on file   Social History Narrative   Has living will   Wife is health care POA   Would accept CPR but no prolonged machines.    Would not want a feeding tube if cognitively unaware            Review of Systems Appetite is good Weight is stable Poor sleeper ---  only sleeps a couple of hours, then up, then goes back to sleep. No sig daytime somnolence Stable nocturia x 2 Some daytime urgency since the prostate surgery    Objective:   Physical Exam  Constitutional: He is oriented to person, place, and time. He appears well-developed and well-nourished. No distress.  HENT:  Mouth/Throat: Oropharynx is clear and moist. No oropharyngeal exudate.  Mild right frontal tenderness TMs okay Moderate right nasal inflammation  Neck: Normal range of motion. Neck supple. No thyromegaly present.  Cardiovascular: Normal rate, regular rhythm, normal heart sounds and intact distal pulses.  Exam reveals no gallop.   No murmur heard. Pulmonary/Chest: Effort normal and breath sounds normal. No respiratory distress. He has no wheezes. He has no rales.  Abdominal: Soft. There is no tenderness.  Musculoskeletal: He exhibits no edema and no tenderness.  Lymphadenopathy:    He has no cervical adenopathy.  Neurological: He is alert and oriented to person, place, and time.  President-- "Obama, Danae [place], Bush---(then Clinton after prompting) [phone] [id number]......[name] Recall 1/3  Skin: No rash noted. No erythema.  Psychiatric: He has a normal mood and affect. His behavior is normal.          Assessment & Plan:

## 2013-04-16 NOTE — Addendum Note (Signed)
Addended by: Sueanne Margarita on: 04/16/2013 10:45 AM   Modules accepted: Orders

## 2013-04-16 NOTE — Addendum Note (Signed)
Addended by: Sueanne Margarita on: 04/16/2013 10:09 AM   Modules accepted: Orders

## 2013-04-16 NOTE — Assessment & Plan Note (Signed)
Discussed meds for this He prefers no statin---will still consider if LDL up to 190

## 2013-04-16 NOTE — Assessment & Plan Note (Signed)
Seems like a secondary bacterial infection Will treat with augmentin

## 2013-04-16 NOTE — Assessment & Plan Note (Signed)
I have personally reviewed the Medicare Annual Wellness questionnaire and have noted 1. The patient's medical and social history 2. Their use of alcohol, tobacco or illicit drugs 3. Their current medications and supplements 4. The patient's functional ability including ADL's, fall risks, home safety risks and hearing or visual             impairment. 5. Diet and physical activities 6. Evidence for depression or mood disorders  The patients weight, height, BMI and visual acuity have been recorded in the chart I have made referrals, counseling and provided education to the patient based review of the above and I have provided the pt with a written personalized care plan for preventive services.  I have provided you with a copy of your personalized plan for preventive services. Please take the time to review along with your updated medication list.  Needs Td No cancer screening

## 2013-04-16 NOTE — Assessment & Plan Note (Signed)
Mild balance issues On aspirin

## 2013-04-16 NOTE — Assessment & Plan Note (Signed)
BP Readings from Last 3 Encounters:  04/16/13 130/70  09/30/12 120/70  05/18/12 144/85   Good control Due for labs

## 2013-04-19 ENCOUNTER — Encounter: Payer: Self-pay | Admitting: *Deleted

## 2013-05-11 ENCOUNTER — Other Ambulatory Visit: Payer: Self-pay | Admitting: Internal Medicine

## 2013-05-11 NOTE — Telephone Encounter (Signed)
rx called into pharmacy

## 2013-05-11 NOTE — Telephone Encounter (Signed)
Okay #90 x 0 

## 2013-05-11 NOTE — Telephone Encounter (Signed)
Last filled 04/08/13 

## 2013-05-19 DIAGNOSIS — H9120 Sudden idiopathic hearing loss, unspecified ear: Secondary | ICD-10-CM | POA: Diagnosis not present

## 2013-05-19 DIAGNOSIS — H908 Mixed conductive and sensorineural hearing loss, unspecified: Secondary | ICD-10-CM | POA: Diagnosis not present

## 2013-05-19 DIAGNOSIS — H6120 Impacted cerumen, unspecified ear: Secondary | ICD-10-CM | POA: Diagnosis not present

## 2013-06-11 ENCOUNTER — Other Ambulatory Visit: Payer: Self-pay | Admitting: Internal Medicine

## 2013-06-11 NOTE — Telephone Encounter (Signed)
Okay #90 x 0 

## 2013-06-11 NOTE — Telephone Encounter (Signed)
05/11/13 

## 2013-06-11 NOTE — Telephone Encounter (Signed)
rx called into pharmacy

## 2013-07-09 ENCOUNTER — Other Ambulatory Visit: Payer: Self-pay | Admitting: Internal Medicine

## 2013-07-09 NOTE — Telephone Encounter (Signed)
Okay #90 x 0 

## 2013-07-09 NOTE — Telephone Encounter (Signed)
06/11/13 

## 2013-07-09 NOTE — Telephone Encounter (Signed)
rx called into pharmacy

## 2013-07-28 DIAGNOSIS — H3520 Other non-diabetic proliferative retinopathy, unspecified eye: Secondary | ICD-10-CM | POA: Diagnosis not present

## 2013-07-28 DIAGNOSIS — H35359 Cystoid macular degeneration, unspecified eye: Secondary | ICD-10-CM | POA: Diagnosis not present

## 2013-07-28 DIAGNOSIS — H33009 Unspecified retinal detachment with retinal break, unspecified eye: Secondary | ICD-10-CM | POA: Diagnosis not present

## 2013-07-28 DIAGNOSIS — Z961 Presence of intraocular lens: Secondary | ICD-10-CM | POA: Diagnosis not present

## 2013-08-12 ENCOUNTER — Other Ambulatory Visit: Payer: Self-pay | Admitting: Internal Medicine

## 2013-08-12 NOTE — Telephone Encounter (Signed)
rx called into pharmacy

## 2013-08-12 NOTE — Telephone Encounter (Signed)
07/09/13 

## 2013-08-12 NOTE — Telephone Encounter (Signed)
Okay #90 x 0 

## 2013-09-10 ENCOUNTER — Other Ambulatory Visit: Payer: Self-pay | Admitting: Internal Medicine

## 2013-09-10 NOTE — Telephone Encounter (Signed)
Called to [facility] [address] [place]. 

## 2013-09-10 NOTE — Telephone Encounter (Signed)
Last office visit 04/16/2013.  Last refilled 08/12/2013 for #90.  Ok to refill?

## 2013-10-06 DIAGNOSIS — Z8546 Personal history of malignant neoplasm of prostate: Secondary | ICD-10-CM | POA: Diagnosis not present

## 2013-10-06 DIAGNOSIS — H35359 Cystoid macular degeneration, unspecified eye: Secondary | ICD-10-CM | POA: Diagnosis not present

## 2013-10-06 DIAGNOSIS — H33009 Unspecified retinal detachment with retinal break, unspecified eye: Secondary | ICD-10-CM | POA: Diagnosis not present

## 2013-10-06 DIAGNOSIS — H3520 Other non-diabetic proliferative retinopathy, unspecified eye: Secondary | ICD-10-CM | POA: Diagnosis not present

## 2013-10-13 ENCOUNTER — Other Ambulatory Visit: Payer: Self-pay | Admitting: Internal Medicine

## 2013-10-13 NOTE — Telephone Encounter (Signed)
rx called into pharmacy

## 2013-10-13 NOTE — Telephone Encounter (Signed)
Okay #90 x 0 

## 2013-10-13 NOTE — Telephone Encounter (Signed)
09/10/13 

## 2013-11-12 ENCOUNTER — Other Ambulatory Visit: Payer: Self-pay | Admitting: Internal Medicine

## 2013-11-12 NOTE — Telephone Encounter (Signed)
rx called into pharmacy

## 2013-11-12 NOTE — Telephone Encounter (Signed)
10/13/13 

## 2013-11-12 NOTE — Telephone Encounter (Signed)
Okay #90 x 0 

## 2013-12-15 ENCOUNTER — Other Ambulatory Visit: Payer: Self-pay | Admitting: Internal Medicine

## 2013-12-15 NOTE — Telephone Encounter (Signed)
Okay #90 x 0 

## 2013-12-15 NOTE — Telephone Encounter (Signed)
11/12/13 

## 2013-12-15 NOTE — Telephone Encounter (Signed)
rx called into pharmacy

## 2013-12-22 DIAGNOSIS — H35359 Cystoid macular degeneration, unspecified eye: Secondary | ICD-10-CM | POA: Diagnosis not present

## 2013-12-22 DIAGNOSIS — H3520 Other non-diabetic proliferative retinopathy, unspecified eye: Secondary | ICD-10-CM | POA: Diagnosis not present

## 2013-12-22 DIAGNOSIS — H33009 Unspecified retinal detachment with retinal break, unspecified eye: Secondary | ICD-10-CM | POA: Diagnosis not present

## 2013-12-22 DIAGNOSIS — Z961 Presence of intraocular lens: Secondary | ICD-10-CM | POA: Diagnosis not present

## 2014-01-17 ENCOUNTER — Other Ambulatory Visit: Payer: Self-pay | Admitting: Internal Medicine

## 2014-01-19 NOTE — Telephone Encounter (Signed)
#  90 x0 last filled 12/15/13, has appt scheduled in 12/15 with you.

## 2014-01-19 NOTE — Telephone Encounter (Signed)
Called to [facility] [address] [place]. 

## 2014-01-19 NOTE — Telephone Encounter (Signed)
Okay #90 x 0 

## 2014-02-04 DIAGNOSIS — Z23 Encounter for immunization: Secondary | ICD-10-CM | POA: Diagnosis not present

## 2014-02-15 ENCOUNTER — Other Ambulatory Visit: Payer: Self-pay | Admitting: Internal Medicine

## 2014-02-15 NOTE — Telephone Encounter (Signed)
Okay #90 x 0 

## 2014-02-15 NOTE — Telephone Encounter (Signed)
rx called into pharmacy

## 2014-02-15 NOTE — Telephone Encounter (Signed)
01/19/14 

## 2014-03-22 ENCOUNTER — Other Ambulatory Visit: Payer: Self-pay | Admitting: Internal Medicine

## 2014-03-22 NOTE — Telephone Encounter (Signed)
02/15/14 

## 2014-03-22 NOTE — Telephone Encounter (Signed)
rx called into pharmacy

## 2014-03-22 NOTE — Telephone Encounter (Signed)
Approved: #90 x 0 

## 2014-03-24 DIAGNOSIS — H6123 Impacted cerumen, bilateral: Secondary | ICD-10-CM | POA: Diagnosis not present

## 2014-03-24 DIAGNOSIS — H905 Unspecified sensorineural hearing loss: Secondary | ICD-10-CM | POA: Diagnosis not present

## 2014-03-24 DIAGNOSIS — J310 Chronic rhinitis: Secondary | ICD-10-CM | POA: Diagnosis not present

## 2014-03-24 DIAGNOSIS — J0140 Acute pansinusitis, unspecified: Secondary | ICD-10-CM | POA: Diagnosis not present

## 2014-04-20 ENCOUNTER — Encounter: Payer: Self-pay | Admitting: Internal Medicine

## 2014-04-20 ENCOUNTER — Ambulatory Visit (INDEPENDENT_AMBULATORY_CARE_PROVIDER_SITE_OTHER): Payer: Medicare Other | Admitting: Internal Medicine

## 2014-04-20 VITALS — BP 130/60 | HR 65 | Temp 98.0°F | Ht 69.0 in | Wt 200.0 lb

## 2014-04-20 DIAGNOSIS — I10 Essential (primary) hypertension: Secondary | ICD-10-CM

## 2014-04-20 DIAGNOSIS — Z8546 Personal history of malignant neoplasm of prostate: Secondary | ICD-10-CM | POA: Diagnosis not present

## 2014-04-20 DIAGNOSIS — M159 Polyosteoarthritis, unspecified: Secondary | ICD-10-CM

## 2014-04-20 DIAGNOSIS — E785 Hyperlipidemia, unspecified: Secondary | ICD-10-CM | POA: Diagnosis not present

## 2014-04-20 DIAGNOSIS — I639 Cerebral infarction, unspecified: Secondary | ICD-10-CM | POA: Diagnosis not present

## 2014-04-20 DIAGNOSIS — M150 Primary generalized (osteo)arthritis: Secondary | ICD-10-CM | POA: Diagnosis not present

## 2014-04-20 DIAGNOSIS — Z0000 Encounter for general adult medical examination without abnormal findings: Secondary | ICD-10-CM

## 2014-04-20 DIAGNOSIS — Z23 Encounter for immunization: Secondary | ICD-10-CM

## 2014-04-20 DIAGNOSIS — Z7189 Other specified counseling: Secondary | ICD-10-CM

## 2014-04-20 DIAGNOSIS — G3184 Mild cognitive impairment, so stated: Secondary | ICD-10-CM

## 2014-04-20 LAB — CBC WITH DIFFERENTIAL/PLATELET
Basophils Absolute: 0 10*3/uL (ref 0.0–0.1)
Basophils Relative: 0.5 % (ref 0.0–3.0)
Eosinophils Absolute: 0.3 10*3/uL (ref 0.0–0.7)
Eosinophils Relative: 3.2 % (ref 0.0–5.0)
HCT: 47.4 % (ref 39.0–52.0)
Hemoglobin: 15.6 g/dL (ref 13.0–17.0)
Lymphocytes Relative: 36.3 % (ref 12.0–46.0)
Lymphs Abs: 3.4 10*3/uL (ref 0.7–4.0)
MCHC: 32.9 g/dL (ref 30.0–36.0)
MCV: 93.2 fl (ref 78.0–100.0)
Monocytes Absolute: 1 10*3/uL (ref 0.1–1.0)
Monocytes Relative: 10 % (ref 3.0–12.0)
Neutro Abs: 4.7 10*3/uL (ref 1.4–7.7)
Neutrophils Relative %: 50 % (ref 43.0–77.0)
Platelets: 227 10*3/uL (ref 150.0–400.0)
RBC: 5.09 Mil/uL (ref 4.22–5.81)
RDW: 14.5 % (ref 11.5–15.5)
WBC: 9.5 10*3/uL (ref 4.0–10.5)

## 2014-04-20 LAB — COMPREHENSIVE METABOLIC PANEL
ALT: 12 U/L (ref 0–53)
AST: 15 U/L (ref 0–37)
Albumin: 3.8 g/dL (ref 3.5–5.2)
Alkaline Phosphatase: 66 U/L (ref 39–117)
BUN: 15 mg/dL (ref 6–23)
CO2: 29 mEq/L (ref 19–32)
Calcium: 9.6 mg/dL (ref 8.4–10.5)
Chloride: 105 mEq/L (ref 96–112)
Creatinine, Ser: 1.5 mg/dL (ref 0.4–1.5)
GFR: 47.38 mL/min — ABNORMAL LOW (ref >60.00)
Glucose, Bld: 83 mg/dL (ref 70–99)
Potassium: 4 mEq/L (ref 3.5–5.1)
Sodium: 141 mEq/L (ref 135–145)
Total Bilirubin: 0.7 mg/dL (ref 0.2–1.2)
Total Protein: 7 g/dL (ref 6.0–8.3)

## 2014-04-20 LAB — LIPID PANEL
Cholesterol: 222 mg/dL — ABNORMAL HIGH (ref 0–200)
HDL: 49.5 mg/dL (ref >39.00)
LDL Cholesterol: 145 mg/dL — ABNORMAL HIGH (ref 0–99)
NonHDL: 172.5
Total CHOL/HDL Ratio: 4
Triglycerides: 138 mg/dL (ref 0.0–149.0)
VLDL: 27.6 mg/dL (ref 0.0–40.0)

## 2014-04-20 LAB — T4, FREE: Free T4: 0.82 ng/dL (ref 0.60–1.60)

## 2014-04-20 LAB — PSA: PSA: 0.01 ng/mL — ABNORMAL LOW (ref 0.10–4.00)

## 2014-04-20 MED ORDER — TRAMADOL HCL 50 MG PO TABS: 50.0000 mg | ORAL_TABLET | Freq: Three times a day (TID) | ORAL | Status: DC

## 2014-04-20 MED ORDER — LOSARTAN POTASSIUM-HCTZ 50-12.5 MG PO TABS: 1.0000 | ORAL_TABLET | Freq: Every day | ORAL | Status: DC

## 2014-04-20 NOTE — Progress Notes (Signed)
Subjective:    Patient ID: Kyle [name], male    DOB: Mar 24, 1932, 80 y.o.   MRN: [id number]  HPI Here for Medicare wellness and follow up of HTN and other chronic medical problems Reviewed form and advanced directives Reviewed other doctors--just eye doctor He has no new concerns No tobacco or alcohol Vision and hearing okay No exercise---discussed this Independent in instrumental ADLs No falls in the past year No depression or anhedonia--but "I don't have the ambition I did"  Still with arthritis and sciatica Right sided pain Tramadol still gives relief--also takes the tylenol Will occasionally take a BC  Still takes daily aspirin as secondary prevention Some balance problems---inconsistent with cane No new signs of neurologic problem--- no weakness, facial droop, change in speech or swallowing Did have brief spell of weakness, couldn't remember what day it was---but only lasted 10-30 minutes  Still on red yeast rice for cholesterol Had trouble with statin in past No myalgias  No chest pain No specific SOB--but stamina is worse.  Discussed needing to walk regularly again No syncope but will have slight orthostatic dizziness No edema  Reviewed prostate cancer history Prostatectomy 6-7 years ago No RT  Current Outpatient Prescriptions on File Prior to Visit  Medication Sig Dispense Refill  . aspirin 81 MG tablet Take 81 mg by mouth daily.      . Cholecalciferol (VITAMIN D) 1000 UNITS capsule Take 1,000 Units by mouth daily.      . fish oil-omega-3 fatty acids 1000 MG capsule Take 1 g by mouth daily.    . Red Yeast Rice 600 MG CAPS Take 1 capsule by mouth daily.     . vitamin B-12 (CYANOCOBALAMIN) 50 MCG tablet Take 50 mcg by mouth daily.     No current facility-administered medications on file prior to visit.    Allergies  Allergen Reactions  . Simvastatin     REACTION: myalgias    Past Medical History  Diagnosis Date  . History of prostate cancer   .  Hyperlipidemia   . Hypertension   . Arthritis   . Impaired fasting glucose   . Detached retina     right  . Pneumonia   . H/O dizziness   . Stroke     x 3  . Urgency of urination   . Cancer     prostate CA  . GERD (gastroesophageal reflux disease)     Past Surgical History  Procedure Laterality Date  . Hernia repair    . Prostate surgery    . Tonsillectomy    . Eye surgery    . Colonoscopy w/ polypectomy    . Lumbar laminectomy/decompression microdiscectomy  01/24/2012    Procedure: LUMBAR LAMINECTOMY/DECOMPRESSION MICRODISCECTOMY 1 LEVEL;  Surgeon: Winfield Cunas, MD;  Location: [place] [facility];  Service: Neurosurgery;  Laterality: Right;  RIGHT Lumbar Three-Four far lateral diskectomy    Family History  Problem Relation Age of Onset  . Heart disease Mother   . Coronary artery disease Mother   . Heart disease Father   . Coronary artery disease Father   . Heart disease Brother   . Diabetes Neg Hx   . Heart disease Brother   . Cancer Brother     prostate    History   Social History  . Marital Status: Married    Spouse Name: N/A    Number of Children: 1  . Years of Education: N/A   Occupational History  . retired    Science writer  History Main Topics  . Smoking status: Never Smoker   . Smokeless tobacco: Former Systems developer    Types: Chew    Quit date: 04/22/2004  . Alcohol Use: No  . Drug Use: No  . Sexual Activity: Not on file   Other Topics Concern  . Not on file   Social History Narrative   Has living will   Wife is health care POA   Would accept CPR but no prolonged machines.    Would not want a feeding tube if cognitively unaware            Review of Systems Bowels are fine Voids well. Stable 2/night nocturia Sleeps well Appetite is fine Weight is up a few pounds Teeth okay--- keeps up with dentist Inconsistent with seat belt--- discussed    Objective:   Physical Exam  Constitutional: He is oriented to person, place, and time. He appears  well-developed and well-nourished. No distress.  HENT:  Mouth/Throat: Oropharynx is clear and moist. No oropharyngeal exudate.  Full dentures  Neck: Normal range of motion. Neck supple. No thyromegaly present.  Cardiovascular: Normal rate, normal heart sounds and intact distal pulses.  Exam reveals no gallop.   No murmur heard. Frequent extra beats and some trigeminy  Pulmonary/Chest: Effort normal and breath sounds normal. No respiratory distress. He has no wheezes. He has no rales.  Abdominal: Soft. There is no tenderness.  Musculoskeletal: He exhibits no edema or tenderness.  Some mild DIP angulations in hands  Lymphadenopathy:    He has no cervical adenopathy.  Neurological: He is alert and oriented to person, place, and time.  President-- "Obama, Bush, Clinton" 100-93-?? D-l-o-r-w Recall 1/3  Skin: No rash noted.  Multiple benign angiomas and keratoses  Psychiatric: He has a normal mood and affect. His behavior is normal.          Assessment & Plan:

## 2014-04-20 NOTE — Assessment & Plan Note (Signed)
See social history 

## 2014-04-20 NOTE — Assessment & Plan Note (Signed)
Will check PSA one last time

## 2014-04-20 NOTE — Assessment & Plan Note (Signed)
Does okay with the tramadol and tylenol

## 2014-04-20 NOTE — Assessment & Plan Note (Signed)
No apparent progression from last year Discussed physical activity, social engagement, etc

## 2014-04-20 NOTE — Assessment & Plan Note (Signed)
No statin due to past intolerance On red yeast rice

## 2014-04-20 NOTE — Assessment & Plan Note (Signed)
BP Readings from Last 3 Encounters:  04/20/14 130/60  04/16/13 130/70  09/30/12 120/70   Good control  No changes needed

## 2014-04-20 NOTE — Progress Notes (Signed)
Pre visit review using our clinic review tool, if applicable. No additional management support is needed unless otherwise documented below in the visit note. 

## 2014-04-20 NOTE — Assessment & Plan Note (Signed)
Mild balance problems Discussed the cane On aspirin

## 2014-04-20 NOTE — Assessment & Plan Note (Signed)
I have personally reviewed the Medicare Annual Wellness questionnaire and have noted 1. The patient's medical and social history 2. Their use of alcohol, tobacco or illicit drugs 3. Their current medications and supplements 4. The patient's functional ability including ADL's, fall risks, home safety risks and hearing or visual             impairment. 5. Diet and physical activities 6. Evidence for depression or mood disorders  The patients weight, height, BMI and visual acuity have been recorded in the chart I have made referrals, counseling and provided education to the patient based review of the above and I have provided the pt with a written personalized care plan for preventive services.  I have provided you with a copy of your personalized plan for preventive services. Please take the time to review along with your updated medication list.  Due for prevnar No cancer screening due to age Needs to be more physically active

## 2014-04-20 NOTE — Addendum Note (Signed)
Addended by: Despina Hidden on: 04/20/2014 10:07 AM   Modules accepted: Orders

## 2014-04-21 ENCOUNTER — Telehealth: Payer: Self-pay | Admitting: Internal Medicine

## 2014-04-21 NOTE — Telephone Encounter (Signed)
emmi mailed  °

## 2014-04-25 ENCOUNTER — Encounter: Payer: Self-pay | Admitting: *Deleted

## 2014-05-10 ENCOUNTER — Encounter (HOSPITAL_COMMUNITY): Payer: Self-pay | Admitting: Emergency Medicine

## 2014-05-10 ENCOUNTER — Inpatient Hospital Stay (HOSPITAL_COMMUNITY)
Admission: EM | Admit: 2014-05-10 | Discharge: 2014-05-15 | DRG: 194 | Disposition: A | Discharge status: Home Health | Payer: Medicare Other | Attending: Internal Medicine | Admitting: Internal Medicine

## 2014-05-10 ENCOUNTER — Emergency Department (HOSPITAL_COMMUNITY): Payer: Medicare Other

## 2014-05-10 DIAGNOSIS — I129 Hypertensive chronic kidney disease with stage 1 through stage 4 chronic kidney disease, or unspecified chronic kidney disease: Secondary | ICD-10-CM | POA: Diagnosis present

## 2014-05-10 DIAGNOSIS — E785 Hyperlipidemia, unspecified: Secondary | ICD-10-CM | POA: Diagnosis present

## 2014-05-10 DIAGNOSIS — Z1639 Resistance to other specified antimicrobial drug: Secondary | ICD-10-CM | POA: Diagnosis present

## 2014-05-10 DIAGNOSIS — Z8673 Personal history of transient ischemic attack (TIA), and cerebral infarction without residual deficits: Secondary | ICD-10-CM | POA: Diagnosis not present

## 2014-05-10 DIAGNOSIS — J189 Pneumonia, unspecified organism: Secondary | ICD-10-CM | POA: Diagnosis present

## 2014-05-10 DIAGNOSIS — N179 Acute kidney failure, unspecified: Secondary | ICD-10-CM | POA: Diagnosis present

## 2014-05-10 DIAGNOSIS — I10 Essential (primary) hypertension: Secondary | ICD-10-CM | POA: Diagnosis present

## 2014-05-10 DIAGNOSIS — R059 Cough, unspecified: Secondary | ICD-10-CM

## 2014-05-10 DIAGNOSIS — R05 Cough: Secondary | ICD-10-CM | POA: Diagnosis not present

## 2014-05-10 DIAGNOSIS — R0602 Shortness of breath: Secondary | ICD-10-CM | POA: Diagnosis not present

## 2014-05-10 DIAGNOSIS — Z7982 Long term (current) use of aspirin: Secondary | ICD-10-CM

## 2014-05-10 DIAGNOSIS — Z87891 Personal history of nicotine dependence: Secondary | ICD-10-CM | POA: Diagnosis not present

## 2014-05-10 DIAGNOSIS — Z888 Allergy status to other drugs, medicaments and biological substances status: Secondary | ICD-10-CM | POA: Diagnosis not present

## 2014-05-10 DIAGNOSIS — E876 Hypokalemia: Secondary | ICD-10-CM | POA: Diagnosis present

## 2014-05-10 DIAGNOSIS — R531 Weakness: Secondary | ICD-10-CM | POA: Diagnosis not present

## 2014-05-10 DIAGNOSIS — B964 Proteus (mirabilis) (morganii) as the cause of diseases classified elsewhere: Secondary | ICD-10-CM | POA: Diagnosis present

## 2014-05-10 DIAGNOSIS — R0902 Hypoxemia: Secondary | ICD-10-CM | POA: Diagnosis not present

## 2014-05-10 DIAGNOSIS — Z8249 Family history of ischemic heart disease and other diseases of the circulatory system: Secondary | ICD-10-CM | POA: Diagnosis not present

## 2014-05-10 DIAGNOSIS — N189 Chronic kidney disease, unspecified: Secondary | ICD-10-CM | POA: Diagnosis present

## 2014-05-10 DIAGNOSIS — Z8546 Personal history of malignant neoplasm of prostate: Secondary | ICD-10-CM

## 2014-05-10 DIAGNOSIS — J9601 Acute respiratory failure with hypoxia: Secondary | ICD-10-CM | POA: Diagnosis not present

## 2014-05-10 LAB — COMPREHENSIVE METABOLIC PANEL
ALT: 12 U/L (ref 0–53)
AST: 24 U/L (ref 0–37)
Albumin: 3.4 g/dL — ABNORMAL LOW (ref 3.5–5.2)
Alkaline Phosphatase: 52 U/L (ref 39–117)
Anion gap: 8 (ref 5–15)
BUN: 18 mg/dL (ref 6–23)
CO2: 25 mmol/L (ref 19–32)
Calcium: 9.1 mg/dL (ref 8.4–10.5)
Chloride: 103 mEq/L (ref 96–112)
Creatinine, Ser: 1.69 mg/dL — ABNORMAL HIGH (ref 0.50–1.35)
GFR calc Af Amer: 42 mL/min — ABNORMAL LOW (ref >90)
GFR calc non Af Amer: 37 mL/min — ABNORMAL LOW (ref >90)
Glucose, Bld: 113 mg/dL — ABNORMAL HIGH (ref 70–99)
Potassium: 3.8 mmol/L (ref 3.5–5.1)
Sodium: 136 mmol/L (ref 135–145)
Total Bilirubin: 1 mg/dL (ref 0.3–1.2)
Total Protein: 6.5 g/dL (ref 6.0–8.3)

## 2014-05-10 LAB — URINALYSIS, ROUTINE W REFLEX MICROSCOPIC
Glucose, UA: NEGATIVE mg/dL
Ketones, ur: 15 mg/dL — AB
Leukocytes, UA: NEGATIVE
Nitrite: NEGATIVE
Protein, ur: NEGATIVE mg/dL
Specific Gravity, Urine: 1.024 (ref 1.005–1.030)
Urobilinogen, UA: 0.2 mg/dL (ref 0.0–1.0)
pH: 5 (ref 5.0–8.0)

## 2014-05-10 LAB — CBC WITH DIFFERENTIAL/PLATELET
Basophils Absolute: 0 10*3/uL (ref 0.0–0.1)
Basophils Relative: 0 % (ref 0–1)
Eosinophils Absolute: 0.1 10*3/uL (ref 0.0–0.7)
Eosinophils Relative: 0 % (ref 0–5)
HCT: 42.9 % (ref 39.0–52.0)
Hemoglobin: 14.6 g/dL (ref 13.0–17.0)
Lymphocytes Relative: 12 % (ref 12–46)
Lymphs Abs: 1.4 10*3/uL (ref 0.7–4.0)
MCH: 31.3 pg (ref 26.0–34.0)
MCHC: 34 g/dL (ref 30.0–36.0)
MCV: 91.9 fL (ref 78.0–100.0)
Monocytes Absolute: 1.3 10*3/uL — ABNORMAL HIGH (ref 0.1–1.0)
Monocytes Relative: 12 % (ref 3–12)
Neutro Abs: 8.6 10*3/uL — ABNORMAL HIGH (ref 1.7–7.7)
Neutrophils Relative %: 76 % (ref 43–77)
Platelets: 169 10*3/uL (ref 150–400)
RBC: 4.67 MIL/uL (ref 4.22–5.81)
RDW: 14.5 % (ref 11.5–15.5)
WBC: 11.3 10*3/uL — ABNORMAL HIGH (ref 4.0–10.5)

## 2014-05-10 LAB — URINE MICROSCOPIC-ADD ON

## 2014-05-10 LAB — I-STAT TROPONIN, ED: Troponin i, poc: 0.02 ng/mL (ref 0.00–0.08)

## 2014-05-10 LAB — I-STAT CG4 LACTIC ACID, ED: Lactic Acid, Venous: 1.02 mmol/L (ref 0.5–2.2)

## 2014-05-10 MED ORDER — ASPIRIN 81 MG PO CHEW
81.0000 mg | CHEWABLE_TABLET | Freq: Every day | ORAL | Status: DC
  Administered 2014-05-11 – 2014-05-15 (×5): 81 mg via ORAL
  Filled 2014-05-10 (×6): qty 1

## 2014-05-10 MED ORDER — ONDANSETRON HCL 4 MG/2ML IJ SOLN: 4.0000 mg | Freq: Four times a day (QID) | INTRAMUSCULAR | Status: DC | PRN

## 2014-05-10 MED ORDER — HYDROCHLOROTHIAZIDE 12.5 MG PO CAPS
12.5000 mg | ORAL_CAPSULE | Freq: Every day | ORAL | Status: DC
  Administered 2014-05-11: 12.5 mg via ORAL
  Filled 2014-05-10: qty 1

## 2014-05-10 MED ORDER — OXYCODONE HCL 5 MG PO TABS: 5.0000 mg | ORAL_TABLET | ORAL | Status: DC | PRN

## 2014-05-10 MED ORDER — ACETAMINOPHEN 325 MG PO TABS
650.0000 mg | ORAL_TABLET | Freq: Four times a day (QID) | ORAL | Status: DC | PRN
  Administered 2014-05-13 – 2014-05-14 (×2): 650 mg via ORAL
  Filled 2014-05-10 (×2): qty 2

## 2014-05-10 MED ORDER — TRAMADOL HCL 50 MG PO TABS
50.0000 mg | ORAL_TABLET | Freq: Three times a day (TID) | ORAL | Status: DC
  Administered 2014-05-10 – 2014-05-15 (×14): 50 mg via ORAL
  Filled 2014-05-10 (×14): qty 1

## 2014-05-10 MED ORDER — LOSARTAN POTASSIUM-HCTZ 50-12.5 MG PO TABS: 1.0000 | ORAL_TABLET | Freq: Every day | ORAL | Status: DC

## 2014-05-10 MED ORDER — VITAMIN B-12 100 MCG PO TABS
50.0000 ug | ORAL_TABLET | Freq: Every day | ORAL | Status: DC
  Administered 2014-05-11 – 2014-05-15 (×5): 50 ug via ORAL
  Filled 2014-05-10 (×5): qty 1

## 2014-05-10 MED ORDER — ADULT MULTIVITAMIN W/MINERALS CH
1.0000 | ORAL_TABLET | Freq: Every day | ORAL | Status: DC
  Administered 2014-05-11 – 2014-05-15 (×5): 1 via ORAL
  Filled 2014-05-10 (×5): qty 1

## 2014-05-10 MED ORDER — LOSARTAN POTASSIUM 50 MG PO TABS
50.0000 mg | ORAL_TABLET | Freq: Every day | ORAL | Status: DC
  Administered 2014-05-11: 50 mg via ORAL
  Filled 2014-05-10: qty 1

## 2014-05-10 MED ORDER — DEXTROSE 5 % IV SOLN: 500.0000 mg | INTRAVENOUS | Status: DC

## 2014-05-10 MED ORDER — DEXTROSE 5 % IV SOLN
1.0000 g | INTRAVENOUS | Status: DC
  Administered 2014-05-11 – 2014-05-13 (×3): 1 g via INTRAVENOUS
  Filled 2014-05-10 (×4): qty 10

## 2014-05-10 MED ORDER — DEXTROSE 5 % IV SOLN: 1.0000 g | INTRAVENOUS | Status: DC

## 2014-05-10 MED ORDER — SODIUM CHLORIDE 0.9 % IJ SOLN
3.0000 mL | Freq: Two times a day (BID) | INTRAMUSCULAR | Status: DC
  Administered 2014-05-12 – 2014-05-15 (×7): 3 mL via INTRAVENOUS

## 2014-05-10 MED ORDER — SODIUM CHLORIDE 0.9 % IV SOLN
INTRAVENOUS | Status: DC
  Administered 2014-05-10: 23:00:00 via INTRAVENOUS

## 2014-05-10 MED ORDER — SODIUM CHLORIDE 0.9 % IV BOLUS (SEPSIS)
1000.0000 mL | Freq: Once | INTRAVENOUS | Status: AC
  Administered 2014-05-10: 1000 mL via INTRAVENOUS

## 2014-05-10 MED ORDER — OMEGA-3-ACID ETHYL ESTERS 1 G PO CAPS
1.0000 g | ORAL_CAPSULE | Freq: Every day | ORAL | Status: DC
  Administered 2014-05-11 – 2014-05-15 (×5): 1 g via ORAL
  Filled 2014-05-10 (×5): qty 1

## 2014-05-10 MED ORDER — ACETAMINOPHEN 650 MG RE SUPP: 650.0000 mg | Freq: Four times a day (QID) | RECTAL | Status: DC | PRN

## 2014-05-10 MED ORDER — ONDANSETRON HCL 4 MG PO TABS: 4.0000 mg | ORAL_TABLET | Freq: Four times a day (QID) | ORAL | Status: DC | PRN

## 2014-05-10 MED ORDER — DEXTROSE 5 % IV SOLN
1.0000 g | Freq: Once | INTRAVENOUS | Status: AC
  Administered 2014-05-10: 1 g via INTRAVENOUS
  Filled 2014-05-10: qty 10

## 2014-05-10 MED ORDER — VITAMIN B-1 100 MG PO TABS
100.0000 mg | ORAL_TABLET | Freq: Every day | ORAL | Status: DC
  Administered 2014-05-11 – 2014-05-15 (×5): 100 mg via ORAL
  Filled 2014-05-10 (×5): qty 1

## 2014-05-10 MED ORDER — DEXTROSE 5 % IV SOLN
500.0000 mg | Freq: Once | INTRAVENOUS | Status: AC
  Administered 2014-05-10: 500 mg via INTRAVENOUS
  Filled 2014-05-10: qty 500

## 2014-05-10 MED ORDER — VITAMIN D 1000 UNITS PO TABS
1000.0000 [IU] | ORAL_TABLET | Freq: Every day | ORAL | Status: DC
  Administered 2014-05-11 – 2014-05-15 (×5): 1000 [IU] via ORAL
  Filled 2014-05-10 (×5): qty 1

## 2014-05-10 MED ORDER — FOLIC ACID 1 MG PO TABS
1.0000 mg | ORAL_TABLET | Freq: Every day | ORAL | Status: DC
  Administered 2014-05-11 – 2014-05-15 (×5): 1 mg via ORAL
  Filled 2014-05-10 (×5): qty 1

## 2014-05-10 MED ORDER — HEPARIN SODIUM (PORCINE) 5000 UNIT/ML IJ SOLN
5000.0000 [IU] | Freq: Three times a day (TID) | INTRAMUSCULAR | Status: DC
Start: 2014-05-10 — End: 2014-05-15
  Administered 2014-05-10 – 2014-05-15 (×13): 5000 [IU] via SUBCUTANEOUS
  Filled 2014-05-10 (×17): qty 1

## 2014-05-10 MED ORDER — DEXTROSE 5 % IV SOLN
500.0000 mg | INTRAVENOUS | Status: DC
  Filled 2014-05-10: qty 500

## 2014-05-10 NOTE — H&P (Signed)
[facility] History and Physical  BRAIAN TIJERINA NTI:[id number] DOB: March 09, 1932 DOA: 05/10/2014  Referring physician: Katheren Shams, MD PCP: Viviana Simpler, MD   Chief Complaint: Cough Fever  HPI: ADIEN [name] is a 81 y.o. male presents with pneumonia. Patient states he had been having increased weakness. In addition he was having increased shortness of breath and cough. He states that he used to be a smoker but he has not been told he has COPD. Patient states he has had fever to 101F. He notes chills also. He is bringing up yellow sputum. Patient states has no blood in his sputum. In addition has had chills and has had some pain in his chest when he breaths and when he tries to stand up. He has no nausea or loose stools. He states he decided to come in to the ED after being sick for the last 3-4 days.   Review of Systems:  Constitutional:  No weight loss, ++night sweats, ++Fevers, ++chills, ++fatigue.  HEENT:  ++headaches, Difficulty swallowing,++Sore throat Cardio-vascular:  ++chest pain, Orthopnea, PND, +swelling in lower extremities, anasarca, ++dizziness GI:  No heartburn, indigestion, abdominal pain, nausea, vomiting, diarrhea, change in bowel habits, ++loss of appetite  Resp:  ++shortness of breath at rest. ++productive cough, No coughing up of blood.++yellow color mucus.++wheezing  Skin:  no rash or lesions.  GU:  no dysuria, change in color of urine, no urgency or frequency Musculoskeletal:  No joint pain or swelling. No decreased range of motion Psych:  No change in mood or affect. No depression or anxiety  Past Medical History  Diagnosis Date  . History of prostate cancer   . Hyperlipidemia   . Hypertension   . Arthritis   . Impaired fasting glucose   . Detached retina     right  . Pneumonia   . H/O dizziness   . Stroke     x 3  . Urgency of urination   . Cancer     prostate CA  . GERD (gastroesophageal reflux disease)    Past Surgical History    Procedure Laterality Date  . Hernia repair    . Prostate surgery    . Tonsillectomy    . Eye surgery    . Colonoscopy w/ polypectomy    . Lumbar laminectomy/decompression microdiscectomy  01/24/2012    Procedure: LUMBAR LAMINECTOMY/DECOMPRESSION MICRODISCECTOMY 1 LEVEL;  Surgeon: Winfield Cunas, MD;  Location: [place] [facility];  Service: Neurosurgery;  Laterality: Right;  RIGHT Lumbar Three-Four far lateral diskectomy   Social History:  reports that he has never smoked. He quit smokeless tobacco use about 10 years ago. His smokeless tobacco use included Chew. He reports that he does not drink alcohol or use illicit drugs.  Allergies  Allergen Reactions  . Simvastatin     REACTION: myalgias    Family History  Problem Relation Age of Onset  . Heart disease Mother   . Coronary artery disease Mother   . Heart disease Father   . Coronary artery disease Father   . Heart disease Brother   . Diabetes Neg Hx   . Heart disease Brother   . Cancer Brother     prostate     Prior to Admission medications   Medication Sig Start Date End Date Taking? Authorizing Provider  aspirin 81 MG tablet Take 81 mg by mouth daily.     Yes Historical Provider, MD  Cholecalciferol (VITAMIN D) 1000 UNITS capsule Take 1,000 Units by mouth daily.  Yes Historical Provider, MD  fish oil-omega-3 fatty acids 1000 MG capsule Take 1 g by mouth daily.   Yes Historical Provider, MD  losartan-hydrochlorothiazide (HYZAAR) 50-12.5 MG per tablet Take 1 tablet by mouth daily. 04/20/14  Yes Venia Carbon, MD  Red Yeast Rice 600 MG CAPS Take 1 capsule by mouth daily.    Yes Historical Provider, MD  traMADol (ULTRAM) 50 MG tablet Take 1 tablet (50 mg total) by mouth 3 (three) times daily. 04/20/14  Yes Venia Carbon, MD  vitamin B-12 (CYANOCOBALAMIN) 50 MCG tablet Take 50 mcg by mouth daily.   Yes Historical Provider, MD   Physical Exam: Filed Vitals:   05/10/14 2015 05/10/14 2104 05/10/14 2109 05/10/14 2110  BP:  116/65 124/62    Pulse: 73  77   Temp:    99.1 F (37.3 C)  TempSrc:      Resp: 28  33   Height:      Weight:      SpO2: 98%  100% 100%    Wt Readings from Last 3 Encounters:  05/10/14 90.719 kg (200 lb)  04/20/14 90.719 kg (200 lb)  04/16/13 88.905 kg (196 lb)    General:  Appears calm and comfortable Eyes: PERRL, normal lids, irises & conjunctiva ENT: grossly normal hearing, lips & tongue Neck: no LAD, masses or thyromegaly Cardiovascular: RRR, no m/r/g. No LE edema. Respiratory: ++wheeze ronchi noted Normal respiratory effort. Abdomen: soft, ntnd Skin: no rash or induration seen on limited exam Musculoskeletal: grossly normal tone BUE/BLE Psychiatric: grossly normal mood and affect Neurologic: grossly non-focal.          Labs on Admission:  Basic Metabolic Panel:  Recent Labs Lab 05/10/14 1943  NA 136  K 3.8  CL 103  CO2 25  GLUCOSE 113*  BUN 18  CREATININE 1.69*  CALCIUM 9.1   Liver Function Tests:  Recent Labs Lab 05/10/14 1943  AST 24  ALT 12  ALKPHOS 52  BILITOT 1.0  PROT 6.5  ALBUMIN 3.4*   No results for input(s): LIPASE, AMYLASE in the last 168 hours. No results for input(s): AMMONIA in the last 168 hours. CBC:  Recent Labs Lab 05/10/14 1943  WBC 11.3*  NEUTROABS 8.6*  HGB 14.6  HCT 42.9  MCV 91.9  PLT 169   Cardiac Enzymes: No results for input(s): CKTOTAL, CKMB, CKMBINDEX, TROPONINI in the last 168 hours.  BNP (last 3 results) No results for input(s): PROBNP in the last 8760 hours. CBG: No results for input(s): GLUCAP in the last 168 hours.  Radiological Exams on Admission: Dg Chest 2 View  05/10/2014   CLINICAL DATA:  Weakness and productive cough fnad fever x 3 days. Hx of pneumonia. Hx of HTN - on meds, prostate cancer, dizziness, stroke.  EXAM: CHEST  2 VIEW  COMPARISON:  01/23/2012  FINDINGS: Lung volumes are low. There is bronchovascular crowding at the bases. Additional opacity is also noted on the lateral view,  which appears to be in left lower lobe. This may be atelectasis. Infiltrate is possible.  No pulmonary edema. Cardiac silhouette is top-normal in size. No mediastinal or hilar masses.  No pleural effusion or pneumothorax.  IMPRESSION: 1. Lung base opacity projecting in the left lower lobe. This is accentuated by low lung volumes. It may all be atelectasis. Pneumonia is possible. No other evidence of an acute abnormality.   Electronically Signed   By: Lajean Manes M.D.   On: 05/10/2014 20:54  Assessment/Plan Active Problems:   Hyperlipemia   Essential hypertension, benign   CAP (community acquired pneumonia)   1. CAP -will admit to telemetry -start on IV antibiotics -oxygen therapy -follow CXR -cultures drawn will follow up  2. HTN -will monitor pressures -will continue with home medications  3. Hyperlipidemia -continue with present therapy  Code Status: Full Code (must indicate code status--if unknown or must be presumed, indicate so) DVT Prophylaxis:Heparin Family Communication: None (indicate person spoken with, if applicable, with phone number if by telephone) Disposition Plan: Home (indicate anticipated LOS)  Time spent: [facility] Pager [phone]

## 2014-05-10 NOTE — ED Provider Notes (Signed)
CSN: [id number]     Arrival date & time 05/10/14  1855 History   First MD Initiated Contact with Patient 05/10/14 1914     Chief Complaint  Patient presents with  . Weakness  . Cough     (Consider location/radiation/quality/duration/timing/severity/associated sxs/prior Treatment) Patient is a 81 y.o. male presenting with cough. The history is provided by the patient. No language interpreter was used.  Cough Cough characteristics:  Productive Sputum characteristics:  Yellow Severity:  Moderate Onset quality:  Gradual Duration:  3 days Timing:  Constant Progression:  Unchanged Chronicity:  New Smoker: no   Relieved by:  Nothing Worsened by:  Nothing tried Ineffective treatments:  None tried Associated symptoms: chills, fever and shortness of breath   Associated symptoms: no chest pain   Fever:    Timing:  Constant   Max temp PTA (F):  100   Temp source:  Oral   Progression:  Improving Shortness of breath:    Severity:  Moderate   Onset quality:  Gradual   Timing:  Constant   Progression:  Unchanged   Past Medical History  Diagnosis Date  . History of prostate cancer   . Hyperlipidemia   . Hypertension   . Arthritis   . Impaired fasting glucose   . Detached retina     right  . H/O dizziness   . Urgency of urination   . Cancer     prostate CA  . GERD (gastroesophageal reflux disease)   . Pneumonia   . CAP (community acquired pneumonia) 05/10/2014  . Stroke ~ 1995-02/2009 X 2    /notes 08/22/2010   Past Surgical History  Procedure Laterality Date  . Prostate surgery  1996    Archie Endo 08/22/2010  . Tonsillectomy    . Colonoscopy w/ polypectomy    . Lumbar laminectomy/decompression microdiscectomy  01/24/2012    Procedure: LUMBAR LAMINECTOMY/DECOMPRESSION MICRODISCECTOMY 1 LEVEL;  Surgeon: Winfield Cunas, MD;  Location: [place] [facility];  Service: Neurosurgery;  Laterality: Right;  RIGHT Lumbar Three-Four far lateral diskectomy  . Eye surgery Right   . Retinal  detachment repair w/ scleral buckle le Right 08/2006    Archie Endo 08/22/2010  . Cardiac catheterization  ~ 1995    Archie Endo 08/22/2010  . Hernia repair      hx UHR/notes 08/22/2010  . Inguinal hernia repair Left     Archie Endo 08/22/2010  . Umbilical hernia repair      hx/notes 08/22/2010   Family History  Problem Relation Age of Onset  . Heart disease Mother   . Coronary artery disease Mother   . Heart disease Father   . Coronary artery disease Father   . Heart disease Brother   . Diabetes Neg Hx   . Heart disease Brother   . Cancer Brother     prostate   History  Substance Use Topics  . Smoking status: Never Smoker   . Smokeless tobacco: Former Systems developer    Types: Chew    Quit date: 04/22/2004  . Alcohol Use: No    Review of Systems  Constitutional: Positive for fever and chills.  Respiratory: Positive for cough and shortness of breath.   Cardiovascular: Negative for chest pain.  Neurological: Positive for weakness (generalized).  All other systems reviewed and are negative.     Allergies  Simvastatin  Home Medications   Prior to Admission medications   Medication Sig Start Date End Date Taking? Authorizing Provider  aspirin 81 MG tablet Take 81 mg by mouth daily.  Yes Historical Provider, MD  Cholecalciferol (VITAMIN D) 1000 UNITS capsule Take 1,000 Units by mouth daily.     Yes Historical Provider, MD  fish oil-omega-3 fatty acids 1000 MG capsule Take 1 g by mouth daily.   Yes Historical Provider, MD  losartan-hydrochlorothiazide (HYZAAR) 50-12.5 MG per tablet Take 1 tablet by mouth daily. 04/20/14  Yes Venia Carbon, MD  Red Yeast Rice 600 MG CAPS Take 1 capsule by mouth daily.    Yes Historical Provider, MD  traMADol (ULTRAM) 50 MG tablet Take 1 tablet (50 mg total) by mouth 3 (three) times daily. 04/20/14  Yes Venia Carbon, MD  vitamin B-12 (CYANOCOBALAMIN) 50 MCG tablet Take 50 mcg by mouth daily.   Yes Historical Provider, MD   BP 106/55 mmHg  Pulse 79  Temp(Src)  99.6 F (37.6 C) (Oral)  Resp 20  Ht 6' (1.829 m)  Wt 200 lb (90.719 kg)  BMI 27.12 kg/m2  SpO2 93% Physical Exam  Constitutional: He is oriented to person, place, and time. He appears well-developed and well-nourished. No distress.  HENT:  Head: Normocephalic and atraumatic.  Eyes: Pupils are equal, round, and reactive to light.  Neck: Normal range of motion.  Cardiovascular: Normal rate, regular rhythm and normal heart sounds.   Pulmonary/Chest: Effort normal and breath sounds normal. Tachypnea noted. No respiratory distress. He has no decreased breath sounds. He has no wheezes. He has no rhonchi. He has no rales.  Abdominal: Soft. He exhibits no distension. There is no tenderness. There is no rebound and no guarding.  Musculoskeletal: He exhibits no edema or tenderness.  Neurological: He is alert and oriented to person, place, and time. He exhibits normal muscle tone.  Skin: Skin is warm and dry.  Nursing note and vitals reviewed.   ED Course  Procedures (including critical care time) Labs Review Labs Reviewed  CBC WITH DIFFERENTIAL - Abnormal; Notable for the following:    WBC 11.3 (*)    Neutro Abs 8.6 (*)    Monocytes Absolute 1.3 (*)    All other components within normal limits  COMPREHENSIVE METABOLIC PANEL - Abnormal; Notable for the following:    Glucose, Bld 113 (*)    Creatinine, Ser 1.69 (*)    Albumin 3.4 (*)    GFR calc non Af Amer 37 (*)    GFR calc Af Amer 42 (*)    All other components within normal limits  URINALYSIS, ROUTINE W REFLEX MICROSCOPIC - Abnormal; Notable for the following:    Hgb urine dipstick MODERATE (*)    Bilirubin Urine SMALL (*)    Ketones, ur 15 (*)    All other components within normal limits  URINE MICROSCOPIC-ADD ON - Abnormal; Notable for the following:    Casts HYALINE CASTS (*)    All other components within normal limits  STREP PNEUMONIAE URINARY ANTIGEN - Abnormal; Notable for the following:    Strep Pneumo Urinary Antigen  POSITIVE (*)    All other components within normal limits  COMPREHENSIVE METABOLIC PANEL - Abnormal; Notable for the following:    Potassium 3.4 (*)    Glucose, Bld 108 (*)    Total Protein 5.9 (*)    Albumin 3.0 (*)    GFR calc non Af Amer 48 (*)    GFR calc Af Amer 56 (*)    All other components within normal limits  CBC - Abnormal; Notable for the following:    WBC 13.4 (*)    All other components  within normal limits  URINE CULTURE  CULTURE, BLOOD (ROUTINE X 2)  CULTURE, BLOOD (ROUTINE X 2)  CULTURE, EXPECTORATED SPUTUM-ASSESSMENT  TSH  GLUCOSE, CAPILLARY  LEGIONELLA ANTIGEN, URINE  HEMOGLOBIN A1C  INFLUENZA PANEL BY PCR (TYPE A & B, H1N1)  I-STAT TROPOININ, ED  I-STAT CG4 LACTIC ACID, ED    Imaging Review Dg Chest 2 View  05/10/2014   CLINICAL DATA:  Weakness and productive cough fnad fever x 3 days. Hx of pneumonia. Hx of HTN - on meds, prostate cancer, dizziness, stroke.  EXAM: CHEST  2 VIEW  COMPARISON:  01/23/2012  FINDINGS: Lung volumes are low. There is bronchovascular crowding at the bases. Additional opacity is also noted on the lateral view, which appears to be in left lower lobe. This may be atelectasis. Infiltrate is possible.  No pulmonary edema. Cardiac silhouette is top-normal in size. No mediastinal or hilar masses.  No pleural effusion or pneumothorax.  IMPRESSION: 1. Lung base opacity projecting in the left lower lobe. This is accentuated by low lung volumes. It may all be atelectasis. Pneumonia is possible. No other evidence of an acute abnormality.   Electronically Signed   By: Lajean Manes M.D.   On: 05/10/2014 20:54     EKG Interpretation   Date/Time:  Tuesday May 10 2014 19:08:18 EST Ventricular Rate:  82 PR Interval:  160 QRS Duration: 130 QT Interval:  401 QTC Calculation: 468 R Axis:   -64 Text Interpretation:  Sinus rhythm RBBB and LAFB No significant change  since last tracing Confirmed by YAO  MD, DAVID ([id number]) on 05/10/2014  7:37:57  PM      MDM   Final diagnoses:  Cough  CAP (community acquired pneumonia)    Patient is an 81 year old Caucasian male with pertinent past medical history of an MI and CVA comes to the emergency department today with cough, shortness of breath, and malaise for the past 3 days. Physical exam as above. Initial differential is concerning for pneumonia, PE, and MI. Initial workup included UA, lactic acid, troponin, CBC, CMP, chest x-ray, and an EKG. EKG as detailed above. Patient has no exertional shortness of breath or chest pain.  As result I feel ACS is unlikely. Troponin was negative further supporting this. Lactic acid was 1.02. UA was unremarkable with no leukocytes or bacteria as a result I doubt UTI. CBC had a white count of 11.3 otherwise unremarkable. CMP with a creatinine of 1.69 at the patient's baseline. Chest x-ray demonstrated consolidations in the left lower lobe concerning for pneumonia. With the patient's productive cough, fever at home, and elevated WBC.  I feel this is consistent with his symptoms. He was treated with azithromycin and Rocephin for community acquired pneumonia and blood cultures 2 were drawn. With history consistent with pneumonia, labs and imaging consistent with a pneumonia I doubt a PE and do not feel he requires further evaluation for PE at this time. With the patient's tachypnea he was felt to require admission to the hospital. Patient was admitted to the hospitalist service in good condition. Labs and imaging were reviewed by myself and considered in medical decision-making. Imaging was interpreted by radiology. Care was discussed with attending Dr. Darl Householder.     Katheren Shams, MD 05/11/14 3810  Wandra Arthurs, MD 05/11/14 [phone]

## 2014-05-10 NOTE — ED Notes (Signed)
Pt c/o generalized weakness/cough/bilat shoulder and lower back pain x 2 days. Pt also states he has been running fevers at home. EMS gave pt tylenol and 81mg  of ASA on scene but pt refused transport and came by POV. Pt a/o x 4 on arrival to ED.

## 2014-05-11 ENCOUNTER — Encounter (HOSPITAL_COMMUNITY): Payer: Self-pay | Admitting: General Practice

## 2014-05-11 DIAGNOSIS — N179 Acute kidney failure, unspecified: Secondary | ICD-10-CM

## 2014-05-11 DIAGNOSIS — E876 Hypokalemia: Secondary | ICD-10-CM

## 2014-05-11 LAB — CBC
HCT: 40.7 % (ref 39.0–52.0)
Hemoglobin: 13.5 g/dL (ref 13.0–17.0)
MCH: 30.7 pg (ref 26.0–34.0)
MCHC: 33.2 g/dL (ref 30.0–36.0)
MCV: 92.5 fL (ref 78.0–100.0)
Platelets: 180 10*3/uL (ref 150–400)
RBC: 4.4 MIL/uL (ref 4.22–5.81)
RDW: 14.6 % (ref 11.5–15.5)
WBC: 13.4 10*3/uL — ABNORMAL HIGH (ref 4.0–10.5)

## 2014-05-11 LAB — COMPREHENSIVE METABOLIC PANEL
ALT: 13 U/L (ref 0–53)
AST: 26 U/L (ref 0–37)
Albumin: 3 g/dL — ABNORMAL LOW (ref 3.5–5.2)
Alkaline Phosphatase: 46 U/L (ref 39–117)
Anion gap: 11 (ref 5–15)
BUN: 16 mg/dL (ref 6–23)
CO2: 24 mmol/L (ref 19–32)
Calcium: 8.5 mg/dL (ref 8.4–10.5)
Chloride: 102 mEq/L (ref 96–112)
Creatinine, Ser: 1.35 mg/dL (ref 0.50–1.35)
GFR calc Af Amer: 56 mL/min — ABNORMAL LOW (ref >90)
GFR calc non Af Amer: 48 mL/min — ABNORMAL LOW (ref >90)
Glucose, Bld: 108 mg/dL — ABNORMAL HIGH (ref 70–99)
Potassium: 3.4 mmol/L — ABNORMAL LOW (ref 3.5–5.1)
Sodium: 137 mmol/L (ref 135–145)
Total Bilirubin: 0.6 mg/dL (ref 0.3–1.2)
Total Protein: 5.9 g/dL — ABNORMAL LOW (ref 6.0–8.3)

## 2014-05-11 LAB — STREP PNEUMONIAE URINARY ANTIGEN: Strep Pneumo Urinary Antigen: POSITIVE — AB

## 2014-05-11 LAB — INFLUENZA PANEL BY PCR (TYPE A & B)
H1N1 flu by pcr: NOT DETECTED
Influenza A By PCR: NEGATIVE
Influenza B By PCR: NEGATIVE

## 2014-05-11 LAB — TSH: TSH: 1.073 u[IU]/mL (ref 0.350–4.500)

## 2014-05-11 LAB — HEMOGLOBIN A1C
Hgb A1c MFr Bld: 6.1 % — ABNORMAL HIGH (ref <5.7)
Mean Plasma Glucose: 128 mg/dL — ABNORMAL HIGH (ref <117)

## 2014-05-11 LAB — GLUCOSE, CAPILLARY: Glucose-Capillary: 99 mg/dL (ref 70–99)

## 2014-05-11 MED ORDER — POTASSIUM CHLORIDE CRYS ER 20 MEQ PO TBCR
40.0000 meq | EXTENDED_RELEASE_TABLET | Freq: Once | ORAL | Status: AC
  Administered 2014-05-11: 40 meq via ORAL
  Filled 2014-05-11: qty 2

## 2014-05-11 MED ORDER — CETYLPYRIDINIUM CHLORIDE 0.05 % MT LIQD
7.0000 mL | Freq: Two times a day (BID) | OROMUCOSAL | Status: DC
  Administered 2014-05-11 – 2014-05-15 (×9): 7 mL via OROMUCOSAL

## 2014-05-11 NOTE — Progress Notes (Signed)
New Admission Note:   Arrival Method: via stretcher Mental Orientation: A&Ox4 Telemetry: NSR Assessment: Completed Skin: INTACT IV:NS@50  Pain: IN LOWER BACK AND THROAT Tubes: NONE Safety Measures: Safety Fall Prevention Plan has been given, discussed and signed Admission: Completed [address] [place] Orientation: Patient has been orientated to the room, unit and staff.  Family: NONE AT BEDSIDE  Orders have been reviewed and implemented. Will continue to monitor the patient. Call light has been placed within reach and bed alarm has been activated.   Dudley Major BSN, Consulting civil engineer number: [phone]

## 2014-05-11 NOTE — Progress Notes (Signed)
Utilization review completed. Tiasha Helvie, RN, BSN. 

## 2014-05-11 NOTE — Progress Notes (Signed)
[facility] PROGRESS NOTE  Kyle ULBRICHT YQM:[id number] DOB: 20-Dec-1931 DOA: 05/10/2014 PCP: Viviana Simpler, MD  Assessment/Plan: 1. Community acquired pneumonia -Patient presenting with clinical signs and symptoms consistent with pneumonia as a chest x-ray revealed the presence of a left lower lobe infiltrate -Blood cultures were obtained on admission, pending at time of this dictation -Will continue current antimicrobial regimen -Repeat chest x-ray in a.m.  2.  Hypertension -Patient's blood pressures are soft this morning having blood pressure 106/63 -Will hold antihypertensive agents for now -Follow-up on blood pressures  3.  Hypokalemia. -Labs show potassium 3.4, likely secondary to diuretic therapy -Will give 40 mEq of potassium chloride  4.  Acute on chronic renal failure -Initial presenting with creatinine 1.69, creatinine trended down to 1.35 on a.m. lab work. Likely secondary to prerenal azotemia   Code Status: Full code Family Communication: I spoke to his wife was present at bedside Disposition Plan: Continue IV antimicrobial therapy, despite discharge home when medically stable   Antibiotics:  Ceftriaxone  Azithromycin  HPI/Subjective: Is a pleasant 82 year old gentleman with a past medical history of hypertension, ulcer arthritis, history of prostate cancer admitted to the medicine service on 05/10/2014. He presented with complaints of generalized weakness, malaise, cough, chills, sputum production as initial workup revealed left lower lobe opacity. Was started on empiric IV antimicrobial therapy for community-acquired pneumonia with IV ceftriaxone and azithromycin  Objective: Filed Vitals:   05/11/14 1103  BP: 106/63  Pulse: 62  Temp: 98.3 F (36.8 C)  Resp: 19    Intake/Output Summary (Last 24 hours) at 05/11/14 1524 Last data filed at 05/11/14 1259  Gross per 24 hour  Intake   1600 ml  Output    201 ml  Net   1399 ml   Filed Weights   05/10/14 1908  Weight: 90.719 kg (200 lb)    Exam:   General:  Ill-appearing, awake, alert, following commands  Cardiovascular: Regular rate and rhythm normal S1-S2 no murmurs rubs or gallops  Respiratory: Patient having left-sided crackles and rhonchi, coarse respiratory sounds  Abdomen: Soft nontender nondistended  Musculoskeletal: No edema  Data Reviewed: Basic Metabolic Panel:  Recent Labs Lab 05/10/14 1943 05/11/14 0716  NA 136 137  K 3.8 3.4*  CL 103 102  CO2 25 24  GLUCOSE 113* 108*  BUN 18 16  CREATININE 1.69* 1.35  CALCIUM 9.1 8.5   Liver Function Tests:  Recent Labs Lab 05/10/14 1943 05/11/14 0716  AST 24 26  ALT 12 13  ALKPHOS 52 46  BILITOT 1.0 0.6  PROT 6.5 5.9*  ALBUMIN 3.4* 3.0*   No results for input(s): LIPASE, AMYLASE in the last 168 hours. No results for input(s): AMMONIA in the last 168 hours. CBC:  Recent Labs Lab 05/10/14 1943 05/11/14 0716  WBC 11.3* 13.4*  NEUTROABS 8.6*  --   HGB 14.6 13.5  HCT 42.9 40.7  MCV 91.9 92.5  PLT 169 180   Cardiac Enzymes: No results for input(s): CKTOTAL, CKMB, CKMBINDEX, TROPONINI in the last 168 hours. BNP (last 3 results) No results for input(s): PROBNP in the last 8760 hours. CBG:  Recent Labs Lab 05/11/14 0746  GLUCAP 99    No results found for this or any previous visit (from the past 240 hour(s)).   Studies: Dg Chest 2 View  05/10/2014   CLINICAL DATA:  Weakness and productive cough fnad fever x 3 days. Hx of pneumonia. Hx of HTN - on meds, prostate cancer, dizziness, stroke.  EXAM: CHEST  2 VIEW  COMPARISON:  01/23/2012  FINDINGS: Lung volumes are low. There is bronchovascular crowding at the bases. Additional opacity is also noted on the lateral view, which appears to be in left lower lobe. This may be atelectasis. Infiltrate is possible.  No pulmonary edema. Cardiac silhouette is top-normal in size. No mediastinal or hilar masses.  No pleural effusion or pneumothorax.   IMPRESSION: 1. Lung base opacity projecting in the left lower lobe. This is accentuated by low lung volumes. It may all be atelectasis. Pneumonia is possible. No other evidence of an acute abnormality.   Electronically Signed   By: Lajean Manes M.D.   On: 05/10/2014 20:54    Scheduled Meds: . antiseptic oral rinse  7 mL Mouth Rinse BID  . aspirin  81 mg Oral Daily  . cefTRIAXone (ROCEPHIN)  IV  1 g Intravenous Q24H  . cholecalciferol  1,000 Units Oral Daily  . folic acid  1 mg Oral Daily  . heparin  5,000 Units Subcutaneous 3 times per day  . losartan  50 mg Oral Daily   And  . hydrochlorothiazide  12.5 mg Oral Daily  . multivitamin with minerals  1 tablet Oral Daily  . omega-3 acid ethyl esters  1 g Oral Daily  . sodium chloride  3 mL Intravenous Q12H  . thiamine  100 mg Oral Daily  . traMADol  50 mg Oral TID  . vitamin B-12  50 mcg Oral Daily   Continuous Infusions: . sodium chloride 50 mL/hr at 05/10/14 2325    Active Problems:   Hyperlipemia   Essential hypertension, benign   CAP (community acquired pneumonia)    Time spent: 42 minutes    Kelvin Cellar  [facility] Pager [phone]. If 7PM-7AM, please contact night-coverage at [email or web address], password [facility] 05/11/2014, 3:24 PM  LOS: 1 day

## 2014-05-12 ENCOUNTER — Inpatient Hospital Stay (HOSPITAL_COMMUNITY): Payer: Medicare Other

## 2014-05-12 DIAGNOSIS — R0902 Hypoxemia: Secondary | ICD-10-CM

## 2014-05-12 LAB — CBC
HCT: 42.7 % (ref 39.0–52.0)
Hemoglobin: 13.9 g/dL (ref 13.0–17.0)
MCH: 30.8 pg (ref 26.0–34.0)
MCHC: 32.6 g/dL (ref 30.0–36.0)
MCV: 94.7 fL (ref 78.0–100.0)
Platelets: 183 10*3/uL (ref 150–400)
RBC: 4.51 MIL/uL (ref 4.22–5.81)
RDW: 14.8 % (ref 11.5–15.5)
WBC: 10.7 10*3/uL — ABNORMAL HIGH (ref 4.0–10.5)

## 2014-05-12 LAB — BASIC METABOLIC PANEL
Anion gap: 8 (ref 5–15)
BUN: 18 mg/dL (ref 6–23)
CO2: 26 mmol/L (ref 19–32)
Calcium: 8.8 mg/dL (ref 8.4–10.5)
Chloride: 102 mEq/L (ref 96–112)
Creatinine, Ser: 1.17 mg/dL (ref 0.50–1.35)
GFR calc Af Amer: 66 mL/min — ABNORMAL LOW (ref >90)
GFR calc non Af Amer: 57 mL/min — ABNORMAL LOW (ref >90)
Glucose, Bld: 110 mg/dL — ABNORMAL HIGH (ref 70–99)
Potassium: 3.9 mmol/L (ref 3.5–5.1)
Sodium: 136 mmol/L (ref 135–145)

## 2014-05-12 LAB — LEGIONELLA ANTIGEN, URINE

## 2014-05-12 LAB — URINE CULTURE: Colony Count: 70000

## 2014-05-12 NOTE — Progress Notes (Signed)
°   05/12/14 1424  Clinical Encounter Type  Visited With Patient  Visit Type Initial  Spiritual Encounters  Spiritual Needs Other (Comment) (talk)  Stress Factors  Patient Stress Factors Other (Comment) (Doesn't want to go to nursing home)  Family Stress Factors Not reviewed  Advance Directives (For Healthcare)  Does patient have an advance directive? Yes  Type of Paramedic of [place];Living will  Does patient want to make changes to advanced directive? No - Patient declined  Copy of advanced directive(s) in chart? No - copy requested   Pt was alert and didn't need anything.  He is concerned about if they are going to let him go home or if they are going to send him to a nursing home.  Tomorrow is his birthday and he wants to celebrate at his home.  He was pleasant and [place].  He has son that he said he was blessed to adopt 50 years ago when he was only 44 weeks old.   His son is now married and they want no children.  Pt is sad that he will have no grandchildren.  He said he has lived a long and life and just wants to keep doing things for himself, as he doesn't feel right with others taking care of him.  Drue Dun, Chaplain Intern 05/12/2014, 2:30 PM

## 2014-05-12 NOTE — Evaluation (Signed)
Physical Therapy Evaluation Patient Details Name: KEANTE URIZAR MRN: [id number] DOB: 12/05/1931 Today's Date: 05/12/2014   History of Present Illness  Pt is a 81 y.o. male who presents with pneumonia. Patient states he had been having increased weakness. In addition he was having increased shortness of breath and cough. He states that he used to be a smoker but he has not been told he has COPD. Patient states he has had fever to 101F. In addition has had chills and has had some pain in his chest when he breaths and when he tries to stand up. He states he decided to come in to the ED after being sick for the last 3-4 days.  Clinical Impression  Pt admitted with above diagnosis. Pt currently with functional limitations due to the deficits listed below (see PT Problem List). At the time of PT eval pt was able to perform transfers and ambulation with steadying assist. Pt does not use a walker all the time at home, however declined use today. HHA was required during gait training for safety. Pt will benefit from skilled PT to increase their independence and safety with mobility to allow discharge to the venue listed below.    Pt on RA throughout session and sats remained at 90% at rest, dropping to 86% with mobility. Pt was cued for pursed-lip breathing throughout and sats improved to 88%.     Follow Up Recommendations Home health PT;Supervision for mobility/OOB    Equipment Recommendations  None recommended by PT    Recommendations for Other Services       Precautions / Restrictions Precautions Precautions: Fall Restrictions Weight Bearing Restrictions: No      Mobility  Bed Mobility Overal bed mobility: Needs Assistance Bed Mobility: Supine to Sit     Supine to sit: Supervision     General bed mobility comments: No physical assist. Increased time and use of bed rails required. Supervision for safety.   Transfers Overall transfer level: Needs assistance Equipment used: 1 person  hand held assist Transfers: Sit to/from Stand Sit to Stand: Min assist         General transfer comment: Steadying assist to power-up to full standing position.   Ambulation/Gait Ambulation/Gait assistance: Min assist Ambulation Distance (Feet): 100 Feet Assistive device: 1 person hand held assist Gait Pattern/deviations: Step-through pattern;Decreased stride length;Trendelenburg Gait velocity: Decreased Gait velocity interpretation: Below normal speed for age/gender General Gait Details: Pt required min assist to steady throughout gait training. Pt reaching for the railings in the halls occasionally. Recommended RW however pt declined at this time.   Stairs            Wheelchair Mobility    Modified Rankin (Stroke Patients Only)       Balance Overall balance assessment: Needs assistance Sitting-balance support: Feet supported;No upper extremity supported Sitting balance-Leahy Scale: Fair     Standing balance support: Single extremity supported;During functional activity Standing balance-Leahy Scale: Fair                               Pertinent Vitals/Pain Pain Assessment: No/denies pain    Home Living Family/patient expects to be discharged to:: Private residence Living Arrangements: Spouse/significant other Available Help at Discharge: Family;Available 24 hours/day Type of Home: House Home Access: Level entry     Home Layout: One level Home Equipment: Walker - 2 wheels      Prior Function Level of Independence: Independent with assistive device(s)  Comments: Pt reports that he occasionally uses the RW but otherwise does not require it.      Hand Dominance   Dominant Hand: Right    Extremity/Trunk Assessment   Upper Extremity Assessment: Defer to OT evaluation           Lower Extremity Assessment: Generalized weakness      Cervical / Trunk Assessment: Kyphotic  Communication   Communication: No difficulties   Cognition Arousal/Alertness: Awake/alert Behavior During Therapy: WFL for tasks assessed/performed Overall Cognitive Status: Within Functional Limits for tasks assessed                      General Comments      Exercises        Assessment/Plan    PT Assessment Patient needs continued PT services  PT Diagnosis Difficulty walking;Generalized weakness   PT Problem List Decreased strength;Decreased range of motion;Decreased activity tolerance;Decreased balance;Decreased mobility;Decreased knowledge of use of DME;Decreased safety awareness;Decreased knowledge of precautions;Cardiopulmonary status limiting activity  PT Treatment Interventions DME instruction;Stair training;Gait training;Functional mobility training;Therapeutic activities;Therapeutic exercise;Neuromuscular re-education;Patient/family education   PT Goals (Current goals can be found in the Care Plan section) Acute Rehab PT Goals Patient Stated Goal: Return home with wife PT Goal Formulation: With patient/family Time For Goal Achievement: 05/19/14 Potential to Achieve Goals: Good    Frequency Min 3X/week   Barriers to discharge        Co-evaluation               End of Session Equipment Utilized During Treatment: Gait belt Activity Tolerance: Patient tolerated treatment well Patient left: in chair;with call bell/phone within reach;with chair alarm set;with family/visitor present Nurse Communication: Mobility status         Time: 6962-9528 PT Time Calculation (min) (ACUTE ONLY): 35 min   Charges:   PT Evaluation $Initial PT Evaluation Tier I: 1 Procedure PT Treatments $Gait Training: 8-22 mins   PT G Codes:        Rolinda Roan 17-May-2014, 3:25 PM   Rolinda Roan, PT, DPT Acute Rehabilitation Services Pager: [phone]

## 2014-05-12 NOTE — Progress Notes (Signed)
PROGRESS NOTE  Kyle [name] MCN:[id number] DOB: 28-Aug-1931 DOA: 05/10/2014 PCP: Viviana Simpler, MD  HPI/Subjective:  Kyle [name] is a 81 y.o. white male who presented with pneumonia. Patient states he had been sick for ~4 days and had been having increased weakness. In addition he was having increased shortness of breath and a cough that was productive (yellow sputum, no blood). He had a fever of 101 and was experiencing chills prior to coming to the ED. He has had some pain in his chest when he breathes and when he tries to stand up.  The weakness is still ongoing, but this morning he was feeling much better aside from the weakness; breathing was less labored and, according to him, coughing only comes in bursts every now and then.  Upon ambulation, however, O2 sats dropped to 89 and he was very breathless and said he didn't feel well.  Nasal cannula re-applied.  He and his wife both agree he needs to stay until that resolves.      Assessment/Plan:  Community acquired pneumonia He has been sick for several days - presents with clinical s/s consistent with pneumonia Urine strep pnuemo test is positive, CXR indicates pneumonia (LLL infiltrate); Blood cultures negative Repeat CXR shows continued small, stable LLL opacity Continues to have symptoms  Continue Rocephin Re-check ambulatory O2 sats and consider discharge tomorrow based on results  HTN Patient's blood pressures were soft yesterday: 106/63; antihypertensive agents held BP 134/70 today Restart antihypertensives Continue monitoring outpatient  Acute on chronic renal failure Initial presenting with creatinine 1.69, creatinine trending down to 1.17 on a.m. lab work.  Likely secondary to prerenal azotemia  Hypokalemia Resolved Likely secondary to diuretic therapy; gave 40 mEq of potassium chloride on 1/20    DVT Prophylaxis: Heparin SQ  Code Status: Full Family Communication: Wife stopped by to see him and talk with  providers before snow starts Disposition Plan: Home when O2 on ambulation is better   Consultants:  None  Procedures:  None  Antibiotics: Anti-infectives    Start     Dose/Rate Route Frequency Ordered Stop   05/11/14 2200  azithromycin (ZITHROMAX) 500 mg in dextrose 5 % 250 mL IVPB  Status:  Discontinued     500 mg250 mL/hr over 60 Minutes Intravenous Every 24 hours 05/10/14 2236 05/11/14 0923   05/11/14 2200  cefTRIAXone (ROCEPHIN) 1 g in dextrose 5 % 50 mL IVPB     1 g100 mL/hr over 30 Minutes Intravenous Every 24 hours 05/10/14 2236 05/18/14 2159   05/10/14 2245  cefTRIAXone (ROCEPHIN) 1 g in dextrose 5 % 50 mL IVPB  Status:  Discontinued     1 g100 mL/hr over 30 Minutes Intravenous Every 24 hours 05/10/14 2234 05/10/14 2236   05/10/14 2245  azithromycin (ZITHROMAX) 500 mg in dextrose 5 % 250 mL IVPB  Status:  Discontinued     500 mg250 mL/hr over 60 Minutes Intravenous Every 24 hours 05/10/14 2234 05/10/14 2236   05/10/14 2130  cefTRIAXone (ROCEPHIN) 1 g in dextrose 5 % 50 mL IVPB     1 g100 mL/hr over 30 Minutes Intravenous  Once 05/10/14 2123 05/10/14 2214   05/10/14 2130  azithromycin (ZITHROMAX) 500 mg in dextrose 5 % 250 mL IVPB     500 mg250 mL/hr over 60 Minutes Intravenous  Once 05/10/14 2123 05/10/14 2318       Objective: Filed Vitals:   05/11/14 1700 05/11/14 2100 05/12/14 0500 05/12/14 0930  BP: 126/66 121/57 134/70 134/61  Pulse: 62 64 72 68  Temp: 98.2 F (36.8 C) 98.7 F (37.1 C) 99.5 F (37.5 C) 97.7 F (36.5 C)  TempSrc: Oral Oral Oral Oral  Resp: 16 18 18 18   Height:      Weight:  89.948 kg (198 lb 4.8 oz)    SpO2: 97% 96% 92% 99%    Intake/Output Summary (Last 24 hours) at 05/12/14 1141 Last data filed at 05/12/14 0900  Gross per 24 hour  Intake    780 ml  Output    250 ml  Net    530 ml   Filed Weights   05/10/14 1908 05/11/14 2100  Weight: 90.719 kg (200 lb) 89.948 kg (198 lb 4.8 oz)    Exam: General: NAD, appears stated age,  sitting up in bed eating breakfast  HEENT: Anicteic Sclera, MMM.  Neck: Supple, no JVD, no masses  Cardiovascular: RRR, S1 S2 auscultated, no rubs, murmurs or gallops.  Respiratory: Some slight wheezing on exhalation  Abdomen: Soft, nontender, nondistended, + bowel sounds  Extremities: warm dry without cyanosis clubbing or edema.  Neuro: AAOx3    Data Reviewed: Basic Metabolic Panel:  Recent Labs Lab 05/10/14 1943 05/11/14 0716 05/12/14 0600  NA 136 137 136  K 3.8 3.4* 3.9  CL 103 102 102  CO2 25 24 26   GLUCOSE 113* 108* 110*  BUN 18 16 18   CREATININE 1.69* 1.35 1.17  CALCIUM 9.1 8.5 8.8   Liver Function Tests:  Recent Labs Lab 05/10/14 1943 05/11/14 0716  AST 24 26  ALT 12 13  ALKPHOS 52 46  BILITOT 1.0 0.6  PROT 6.5 5.9*  ALBUMIN 3.4* 3.0*   No results for input(s): LIPASE, AMYLASE in the last 168 hours. No results for input(s): AMMONIA in the last 168 hours. CBC:  Recent Labs Lab 05/10/14 1943 05/11/14 0716 05/12/14 0600  WBC 11.3* 13.4* 10.7*  NEUTROABS 8.6*  --   --   HGB 14.6 13.5 13.9  HCT 42.9 40.7 42.7  MCV 91.9 92.5 94.7  PLT 169 180 183   Cardiac Enzymes: No results for input(s): CKTOTAL, CKMB, CKMBINDEX, TROPONINI in the last 168 hours. BNP (last 3 results) No results for input(s): PROBNP in the last 8760 hours. CBG:  Recent Labs Lab 05/11/14 0746  GLUCAP 99    Recent Results (from the past 240 hour(s))  Urine culture     Status: None (Preliminary result)   Collection Time: 05/10/14  7:57 PM  Result Value Ref Range Status   Specimen Description URINE, CLEAN CATCH  Final   Special Requests NONE  Final   Colony Count   Final    70,000 COLONIES/ML Performed at [facility]    Culture   Final    PROTEUS MIRABILIS Performed at [facility]    Report Status PENDING  Incomplete  Blood culture (routine x 2)     Status: None (Preliminary result)   Collection Time: 05/10/14  9:45 PM  Result Value Ref  Range Status   Specimen Description BLOOD RIGHT ARM  Final   Special Requests BOTTLES DRAWN AEROBIC AND ANAEROBIC 10 CC  Final   Culture   Final           BLOOD CULTURE RECEIVED NO GROWTH TO DATE CULTURE WILL BE HELD FOR 5 DAYS BEFORE ISSUING A FINAL NEGATIVE REPORT Performed at [facility]    Report Status PENDING  Incomplete  Blood culture (routine x 2)     Status: None (Preliminary result)  Collection Time: 05/10/14  9:45 PM  Result Value Ref Range Status   Specimen Description BLOOD RIGHT FOREARM  Final   Special Requests BOTTLES DRAWN AEROBIC AND ANAEROBIC 10 CC  Final   Culture   Final           BLOOD CULTURE RECEIVED NO GROWTH TO DATE CULTURE WILL BE HELD FOR 5 DAYS BEFORE ISSUING A FINAL NEGATIVE REPORT Performed at [facility]    Report Status PENDING  Incomplete     Studies: Dg Chest 2 View  05/12/2014   CLINICAL DATA:  Shortness of breath, weakness, pneumonia  EXAM: CHEST  2 VIEW  COMPARISON:  05/10/2014  FINDINGS: Cardiomediastinal silhouette is stable. Elevation of the right hemidiaphragm again noted. Stable bilateral basilar atelectasis or infiltrate. No pulmonary edema. Thoracic spine osteopenia. Degenerative changes thoracic spine.  IMPRESSION: There is chronic elevation of the right hemidiaphragm. Stable small bilateral basilar atelectasis or infiltrate. No pulmonary edema. Degenerative changes thoracic spine.   Electronically Signed   By: Lahoma Crocker M.D.   On: 05/12/2014 08:20   Dg Chest 2 View  05/10/2014   CLINICAL DATA:  Weakness and productive cough fnad fever x 3 days. Hx of pneumonia. Hx of HTN - on meds, prostate cancer, dizziness, stroke.  EXAM: CHEST  2 VIEW  COMPARISON:  01/23/2012  FINDINGS: Lung volumes are low. There is bronchovascular crowding at the bases. Additional opacity is also noted on the lateral view, which appears to be in left lower lobe. This may be atelectasis. Infiltrate is possible.  No pulmonary edema. Cardiac silhouette is  top-normal in size. No mediastinal or hilar masses.  No pleural effusion or pneumothorax.  IMPRESSION: 1. Lung base opacity projecting in the left lower lobe. This is accentuated by low lung volumes. It may all be atelectasis. Pneumonia is possible. No other evidence of an acute abnormality.   Electronically Signed   By: Lajean Manes M.D.   On: 05/10/2014 20:54    Scheduled Meds: . antiseptic oral rinse  7 mL Mouth Rinse BID  . aspirin  81 mg Oral Daily  . cefTRIAXone (ROCEPHIN)  IV  1 g Intravenous Q24H  . cholecalciferol  1,000 Units Oral Daily  . folic acid  1 mg Oral Daily  . heparin  5,000 Units Subcutaneous 3 times per day  . multivitamin with minerals  1 tablet Oral Daily  . omega-3 acid ethyl esters  1 g Oral Daily  . sodium chloride  3 mL Intravenous Q12H  . thiamine  100 mg Oral Daily  . traMADol  50 mg Oral TID  . vitamin B-12  50 mcg Oral Daily   Continuous Infusions:   Active Problems:   Hyperlipemia   Essential hypertension, benign   CAP (community acquired pneumonia)  Caroline E. Howell-Methvin, PA-S    [facility] Pager [phone]. If 7PM-7AM, please contact night-coverage at [email or web address], password [facility] 05/12/2014, 11:41 AM  LOS: 2 days    Addendum  I personally evaluated patient on 05/12/2014 and agree with the above findings. He was admitted for CAP. He continues to have cough, shortness of breath, and feel ill. We ambulated him down the hallway where his oxygen saturations fell to 88%. On lung exam he had bilateral crackles and rhonchi, little change from yesterday's exam. Labs were followed, his CXR remains stable. Will continue emperic IV antimicrobial with Rocephin and Azithromycin. Will consult physical Therapy.

## 2014-05-12 NOTE — Care Management Note (Signed)
CARE MANAGEMENT NOTE 05/12/2014  Patient:  Kyle [name], Kyle [name]   Account Number:  [id number]  Date Initiated:  05/12/2014  Documentation initiated by:  Darron Stuck  Subjective/Objective Assessment:   CM following for progression and d/c planning.     Action/Plan:   No d/c needs identified.   Anticipated DC Date:  05/12/2014   Anticipated DC Plan:  HOME/SELF CARE         Choice offered to / List presented to:             Status of service:  Completed, signed off Medicare Important Message given?  NA - LOS <3 / Initial given by admissions (If response is "NO", the following Medicare IM given date fields will be blank) Date Medicare IM given:   Medicare IM given by:   Date Additional Medicare IM given:   Additional Medicare IM given by:    Discharge Disposition:  HOME/SELF CARE  Per UR Regulation:    If discussed at Long Length of Stay Meetings, dates discussed:    Comments:

## 2014-05-13 LAB — GLUCOSE, CAPILLARY: Glucose-Capillary: 101 mg/dL — ABNORMAL HIGH (ref 70–99)

## 2014-05-13 NOTE — Care Management Note (Addendum)
CARE MANAGEMENT NOTE 05/13/2014  Patient:  NOTNAMED, SCHOLZ   Account Number:  [id number]  Date Initiated:  05/12/2014  Documentation initiated by:  Shanzay Hepworth  Subjective/Objective Assessment:   CM following for progression and d/c planning.     Action/Plan:   No d/c needs identified. Met with pt to discuss d/c needs. Pt declined any DME agreed to HHPT and selected [facility] for HHPT.    Anticipated DC Date:  05/14/2014   Anticipated DC Plan:  HOME/SELF CARE         Choice offered to / List presented to:  Patient HHPT [facility]           Status of service:  Completed, signed off Medicare Important Message given?  YES (If response is "NO", the following Medicare IM given date fields will be blank) Date Medicare IM given:  05/13/2014 Medicare IM given by:  Shaakira Borrero Date Additional Medicare IM given:   Additional Medicare IM given by:    Discharge Disposition:  HOME/HH SERVICES  Per UR Regulation:    If discussed at Long Length of Stay Meetings, dates discussed:    Comments:

## 2014-05-13 NOTE — Progress Notes (Signed)
PROGRESS NOTE  KAVAN DEVAN JKD:[id number] DOB: November 03, 1931 DOA: 05/10/2014 PCP: Viviana Simpler, MD  HPI/Subjective: 81 year old male patient who presented to the ER with complaints of increasing weakness associated with shortness of breath and cough. Fevers up to 101F. Also associated with chills. Sputum was yellow in color. Also apparently pleuritic type chest pain with movement. In the ER a low-grade temperature of 99.6 with O2 saturation 92% on room air. White count was elevated at 11,300 and chest x-ray was concerning for left lower lobe pneumonia.  Since admission his urine strep antigen has returned positive. He continues to have dyspnea on exertion with room air saturations 87%. He continues to spike fevers in the evening hours. Physical therapy has of a weighted him and recommended home health physical therapy.   Assessment/Plan: Community acquired pneumonia -Slowly improving on empiric antibiotics-urinary strep antigen positive -room air sats 87%-continues to spike nocturnal fevers-repeat chest x-ray with stable left-sided infiltrate and also increased interstitial markings concerning for associated pneumonitis-may require short-term oxygen after discharge  HTN Patient's blood pressures were soft initially 106/63 therefore antihypertensive agents held-blood pressure rebounded on 1/21 so antihypertensive agents were resumed but later discontinued after patient's blood pressure dropped significantly after 1 dose-current blood pressure well controlled off medications-given patient's advanced age he may not require thiazide diuretic after discharge and may also need to be on the lower dose of Cozaar  Acute on chronic renal failure Baseline renal function 15/1.5 -Initial presenting with creatinine 1.69, creatinine trending down to 1.17 on a.m. lab work. -Likely secondary to prerenal azotemia noting patient was on thiazide diuretic prior to admission  Hypokalemia Resolved-Likely secondary  to diuretic therapy; gave 40 mEq of potassium chloride on 1/20  Physical deconditioning -PT recommends home health physical therapy-face-to-face completed in Epic    DVT Prophylaxis: Heparin SQ Code Status: Full Family Communication: No family at bedside Disposition Plan: Home when defervesces; if hypoxemia persistent may require short-term home O2   Consultants:  None  Procedures:  None  Antibiotics: Anti-infectives    Start     Dose/Rate Route Frequency Ordered Stop   05/11/14 2200  azithromycin (ZITHROMAX) 500 mg in dextrose 5 % 250 mL IVPB  Status:  Discontinued     500 mg250 mL/hr over 60 Minutes Intravenous Every 24 hours 05/10/14 2236 05/11/14 0923   05/11/14 2200  cefTRIAXone (ROCEPHIN) 1 g in dextrose 5 % 50 mL IVPB     1 g100 mL/hr over 30 Minutes Intravenous Every 24 hours 05/10/14 2236 05/18/14 2159   05/10/14 2245  cefTRIAXone (ROCEPHIN) 1 g in dextrose 5 % 50 mL IVPB  Status:  Discontinued     1 g100 mL/hr over 30 Minutes Intravenous Every 24 hours 05/10/14 2234 05/10/14 2236   05/10/14 2245  azithromycin (ZITHROMAX) 500 mg in dextrose 5 % 250 mL IVPB  Status:  Discontinued     500 mg250 mL/hr over 60 Minutes Intravenous Every 24 hours 05/10/14 2234 05/10/14 2236   05/10/14 2130  cefTRIAXone (ROCEPHIN) 1 g in dextrose 5 % 50 mL IVPB     1 g100 mL/hr over 30 Minutes Intravenous  Once 05/10/14 2123 05/10/14 2214   05/10/14 2130  azithromycin (ZITHROMAX) 500 mg in dextrose 5 % 250 mL IVPB     500 mg250 mL/hr over 60 Minutes Intravenous  Once 05/10/14 2123 05/10/14 2318       Objective: Filed Vitals:   05/12/14 1810 05/12/14 2100 05/13/14 0500 05/13/14 0950  BP: 144/70 117/53 117/51 116/64  Pulse:  77 66 66 60  Temp: 97.7 F (36.5 C) 99.1 F (37.3 C) 101.1 F (38.4 C) 98.3 F (36.8 C)  TempSrc: Oral Oral Oral Other (Comment)  Resp: 18 18 18 18   Height:      Weight:  199 lb 1.6 oz (90.311 kg)    SpO2: 93% 96% 95% 94%    Intake/Output Summary (Last 24  hours) at 05/13/14 1142 Last data filed at 05/13/14 0900  Gross per 24 hour  Intake    240 ml  Output      0 ml  Net    240 ml   Filed Weights   05/10/14 1908 05/11/14 2100 05/12/14 2100  Weight: 200 lb (90.719 kg) 198 lb 4.8 oz (89.948 kg) 199 lb 1.6 oz (90.311 kg)    Exam: General: NAD, appears stated age, attending M.D. ambulating patient at home HEENT: [place], MMM.  Neck: Supple, no JVD, no masses  Cardiovascular: RRR, S1 S2 auscultated, no rubs, murmurs or gallops.  Respiratory: Some slight wheezing on exhalation, a few basilar crackles-with ambulation patient became short of breath and O2 saturation 87% Abdomen: Soft, nontender, nondistended, + bowel sounds  Extremities: warm dry without cyanosis clubbing or edema.  Neuro: AAOx3    Data Reviewed: Basic Metabolic Panel:  Recent Labs Lab 05/10/14 1943 05/11/14 0716 05/12/14 0600  NA 136 137 136  K 3.8 3.4* 3.9  CL 103 102 102  CO2 25 24 26   GLUCOSE 113* 108* 110*  BUN 18 16 18   CREATININE 1.69* 1.35 1.17  CALCIUM 9.1 8.5 8.8   Liver Function Tests:  Recent Labs Lab 05/10/14 1943 05/11/14 0716  AST 24 26  ALT 12 13  ALKPHOS 52 46  BILITOT 1.0 0.6  PROT 6.5 5.9*  ALBUMIN 3.4* 3.0*   No results for input(s): LIPASE, AMYLASE in the last 168 hours. No results for input(s): AMMONIA in the last 168 hours. CBC:  Recent Labs Lab 05/10/14 1943 05/11/14 0716 05/12/14 0600  WBC 11.3* 13.4* 10.7*  NEUTROABS 8.6*  --   --   HGB 14.6 13.5 13.9  HCT 42.9 40.7 42.7  MCV 91.9 92.5 94.7  PLT 169 180 183   Cardiac Enzymes: No results for input(s): CKTOTAL, CKMB, CKMBINDEX, TROPONINI in the last 168 hours. BNP (last 3 results) No results for input(s): PROBNP in the last 8760 hours. CBG:  Recent Labs Lab 05/11/14 0746  GLUCAP 99    Recent Results (from the past 240 hour(s))  Urine culture     Status: None   Collection Time: 05/10/14  7:57 PM  Result Value Ref Range Status   Specimen  Description URINE, CLEAN CATCH  Final   Special Requests NONE  Final   Colony Count   Final    70,000 COLONIES/ML Performed at [facility]    Culture   Final    PROTEUS MIRABILIS Performed at [facility]    Report Status 05/12/2014 FINAL  Final   Organism ID, Bacteria PROTEUS MIRABILIS  Final      Susceptibility   Proteus mirabilis - MIC*    AMPICILLIN <=2 SENSITIVE Sensitive     CEFAZOLIN <=4 SENSITIVE Sensitive     CEFTRIAXONE <=1 SENSITIVE Sensitive     CIPROFLOXACIN <=0.25 SENSITIVE Sensitive     GENTAMICIN <=1 SENSITIVE Sensitive     LEVOFLOXACIN <=0.12 SENSITIVE Sensitive     NITROFURANTOIN 128 RESISTANT Resistant     TOBRAMYCIN <=1 SENSITIVE Sensitive     TRIMETH/SULFA <=20 SENSITIVE Sensitive  PIP/TAZO <=4 SENSITIVE Sensitive     * PROTEUS MIRABILIS  Blood culture (routine x 2)     Status: None (Preliminary result)   Collection Time: 05/10/14  9:45 PM  Result Value Ref Range Status   Specimen Description BLOOD RIGHT ARM  Final   Special Requests BOTTLES DRAWN AEROBIC AND ANAEROBIC 10 CC  Final   Culture   Final           BLOOD CULTURE RECEIVED NO GROWTH TO DATE CULTURE WILL BE HELD FOR 5 DAYS BEFORE ISSUING A FINAL NEGATIVE REPORT Performed at [facility]    Report Status PENDING  Incomplete  Blood culture (routine x 2)     Status: None (Preliminary result)   Collection Time: 05/10/14  9:45 PM  Result Value Ref Range Status   Specimen Description BLOOD RIGHT FOREARM  Final   Special Requests BOTTLES DRAWN AEROBIC AND ANAEROBIC 10 CC  Final   Culture   Final           BLOOD CULTURE RECEIVED NO GROWTH TO DATE CULTURE WILL BE HELD FOR 5 DAYS BEFORE ISSUING A FINAL NEGATIVE REPORT Performed at [facility]    Report Status PENDING  Incomplete     Studies: Dg Chest 2 View  05/12/2014   CLINICAL DATA:  Shortness of breath, weakness, pneumonia  EXAM: CHEST  2 VIEW  COMPARISON:  05/10/2014  FINDINGS: Cardiomediastinal silhouette  is stable. Elevation of the right hemidiaphragm again noted. Stable bilateral basilar atelectasis or infiltrate. No pulmonary edema. Thoracic spine osteopenia. Degenerative changes thoracic spine.  IMPRESSION: There is chronic elevation of the right hemidiaphragm. Stable small bilateral basilar atelectasis or infiltrate. No pulmonary edema. Degenerative changes thoracic spine.   Electronically Signed   By: Lahoma Crocker M.D.   On: 05/12/2014 08:20    Scheduled Meds: . antiseptic oral rinse  7 mL Mouth Rinse BID  . aspirin  81 mg Oral Daily  . cefTRIAXone (ROCEPHIN)  IV  1 g Intravenous Q24H  . cholecalciferol  1,000 Units Oral Daily  . folic acid  1 mg Oral Daily  . heparin  5,000 Units Subcutaneous 3 times per day  . multivitamin with minerals  1 tablet Oral Daily  . omega-3 acid ethyl esters  1 g Oral Daily  . sodium chloride  3 mL Intravenous Q12H  . thiamine  100 mg Oral Daily  . traMADol  50 mg Oral TID  . vitamin B-12  50 mcg Oral Daily   Continuous Infusions:   Active Problems:   Hyperlipemia   Essential hypertension, benign   CAP (community acquired pneumonia)   Erin Hearing, ANP  [facility] Pager [phone].  If 7PM-7AM, please contact night-coverage at [email or web address], password [facility] 05/13/2014, 11:42 AM  LOS: 3 days     Addendum  I personally evaluated patient on 05/13/2014 and agree with the above findings. On my evaluation this morning he seemed to be better although noted that he spiked a temperature overnight of 101.1. On ambulation he continues to desat to 87%. Repeat chest x-ray performed on 05/12/2014 showed stable small by lateral atelectasis or infiltrate. Since he continues to spike fevers, plan to keep him overnight with the continuation of IV antibiotic therapy. Reassess in a.m. Anticipate discharging him home with home health services when medically stable.

## 2014-05-14 ENCOUNTER — Inpatient Hospital Stay (HOSPITAL_COMMUNITY): Payer: Medicare Other

## 2014-05-14 DIAGNOSIS — J9601 Acute respiratory failure with hypoxia: Secondary | ICD-10-CM

## 2014-05-14 LAB — CBC
HCT: 39.7 % (ref 39.0–52.0)
Hemoglobin: 12.9 g/dL — ABNORMAL LOW (ref 13.0–17.0)
MCH: 30 pg (ref 26.0–34.0)
MCHC: 32.5 g/dL (ref 30.0–36.0)
MCV: 92.3 fL (ref 78.0–100.0)
Platelets: 183 10*3/uL (ref 150–400)
RBC: 4.3 MIL/uL (ref 4.22–5.81)
RDW: 14.5 % (ref 11.5–15.5)
WBC: 6.7 10*3/uL (ref 4.0–10.5)

## 2014-05-14 LAB — BASIC METABOLIC PANEL
Anion gap: 8 (ref 5–15)
BUN: 16 mg/dL (ref 6–23)
CO2: 26 mmol/L (ref 19–32)
Calcium: 8.6 mg/dL (ref 8.4–10.5)
Chloride: 103 mmol/L (ref 96–112)
Creatinine, Ser: 1.26 mg/dL (ref 0.50–1.35)
GFR calc Af Amer: 60 mL/min — ABNORMAL LOW (ref >90)
GFR calc non Af Amer: 52 mL/min — ABNORMAL LOW (ref >90)
Glucose, Bld: 105 mg/dL — ABNORMAL HIGH (ref 70–99)
Potassium: 3.5 mmol/L (ref 3.5–5.1)
Sodium: 137 mmol/L (ref 135–145)

## 2014-05-14 LAB — GLUCOSE, CAPILLARY
Glucose-Capillary: 99 mg/dL (ref 70–99)
Glucose-Capillary: 105 mg/dL — ABNORMAL HIGH (ref 70–99)
Glucose-Capillary: 112 mg/dL — ABNORMAL HIGH (ref 70–99)

## 2014-05-14 MED ORDER — PIPERACILLIN-TAZOBACTAM 3.375 G IVPB 30 MIN: 3.3750 g | Freq: Three times a day (TID) | INTRAVENOUS | Status: DC

## 2014-05-14 MED ORDER — SODIUM CHLORIDE 0.9 % IV BOLUS (SEPSIS)
500.0000 mL | Freq: Once | INTRAVENOUS | Status: AC
  Administered 2014-05-14: 500 mL via INTRAVENOUS

## 2014-05-14 MED ORDER — GUAIFENESIN 100 MG/5ML PO SYRP
200.0000 mg | ORAL_SOLUTION | ORAL | Status: DC
Start: 2014-05-14 — End: 2014-05-15
  Administered 2014-05-14 (×4): 200 mg via ORAL
  Filled 2014-05-14 (×13): qty 10

## 2014-05-14 MED ORDER — PIPERACILLIN-TAZOBACTAM 3.375 G IVPB
3.3750 g | Freq: Three times a day (TID) | INTRAVENOUS | Status: DC
  Administered 2014-05-14 – 2014-05-15 (×3): 3.375 g via INTRAVENOUS
  Filled 2014-05-14 (×5): qty 50

## 2014-05-14 MED ORDER — GUAIFENESIN 100 MG/5ML PO SYRP: 200.0000 mg | ORAL_SOLUTION | ORAL | Status: DC | PRN

## 2014-05-14 NOTE — Progress Notes (Signed)
PROGRESS NOTE  Kyle [name] [id number]:[id number] DOB: 05/12/1931 DOA: 05/10/2014 PCP: Viviana Simpler, MD  HPI: 81 year old male patient who presented to the ER with complaints of increasing weakness associated with shortness of breath and cough. Fevers up to 101F. Also associated with chills. Sputum was yellow in color. Also apparently pleuritic type chest pain with movement. In the ER a low-grade temperature of 99.6 with O2 saturation 92% on room air. White count was elevated at 11,300 and chest x-ray was concerning for left lower lobe pneumonia.  Since admission his urine strep antigen has returned positive. He continues to have dyspnea on exertion with room air saturations 87%. He continues to spike fevers in the evening hours. Physical therapy has of a weighted him and recommended home health physical therapy.  Subjective: Patient reporting increased cough and worsening generalized malaise, something like he's ready to return to home at the present time  Assessment/Plan: Community acquired pneumonia -Slowly improving on empiric antibiotics-urinary strep antigen positive -room air sats 87% on 1/22 and during the previous 24 hours had continued to spike nocturnal fevers-today on 1/23 reported increased coughing and worsening malaise so will add scheduled guaifenesin and repeat chest x-ray -fortunately no more fever spikes in the past 24 hours-may require short-term oxygen after discharge  HTN -Patient's blood pressures were soft initially 106/63 therefore antihypertensive agents held-blood pressure rebounded on 1/21 so antihypertensive agents were resumed but later discontinued after patient's blood pressure dropped significantly after 1 dose-current blood pressure well controlled off medications so we'll continue to monitor closely before resuming any antihypertensive medications-given patient's advanced age he may not require thiazide diuretic after discharge and may also need to be on the lower  dose of Cozaar  Acute on chronic renal failure -Baseline renal function 15/1.5 -Initial presenting with creatinine 1.69, creatinine trending down to 1.17 on a.m. lab work. -Likely secondary to prerenal azotemia noting patient was on thiazide diuretic prior to admission  Hypokalemia Resolved-Likely secondary to diuretic therapy; gave 40 mEq of potassium chloride on 1/20  Physical deconditioning -PT recommends home health physical therapy-face-to-face completed in Epic    DVT Prophylaxis: Heparin SQ Code Status: Full Family Communication: No family at bedside Disposition Plan: Home when defervesces; if hypoxemia persistent may require short-term home O2   Consultants:  None  Procedures:  None  Antibiotics: Anti-infectives    Start     Dose/Rate Route Frequency Ordered Stop   05/11/14 2200  azithromycin (ZITHROMAX) 500 mg in dextrose 5 % 250 mL IVPB  Status:  Discontinued     500 mg250 mL/hr over 60 Minutes Intravenous Every 24 hours 05/10/14 2236 05/11/14 0923   05/11/14 2200  cefTRIAXone (ROCEPHIN) 1 g in dextrose 5 % 50 mL IVPB     1 g100 mL/hr over 30 Minutes Intravenous Every 24 hours 05/10/14 2236 05/18/14 2159   05/10/14 2245  cefTRIAXone (ROCEPHIN) 1 g in dextrose 5 % 50 mL IVPB  Status:  Discontinued     1 g100 mL/hr over 30 Minutes Intravenous Every 24 hours 05/10/14 2234 05/10/14 2236   05/10/14 2245  azithromycin (ZITHROMAX) 500 mg in dextrose 5 % 250 mL IVPB  Status:  Discontinued     500 mg250 mL/hr over 60 Minutes Intravenous Every 24 hours 05/10/14 2234 05/10/14 2236   05/10/14 2130  cefTRIAXone (ROCEPHIN) 1 g in dextrose 5 % 50 mL IVPB     1 g100 mL/hr over 30 Minutes Intravenous  Once 05/10/14 2123 05/10/14 2214   05/10/14 2130  azithromycin (  ZITHROMAX) 500 mg in dextrose 5 % 250 mL IVPB     500 mg250 mL/hr over 60 Minutes Intravenous  Once 05/10/14 2123 05/10/14 2318       Objective: Filed Vitals:   05/13/14 1750 05/13/14 1942 05/14/14 0535 05/14/14  0952  BP: 138/49 132/70 119/51 143/72  Pulse: 71 73 60 60  Temp: 98.8 F (37.1 C) 99.7 F (37.6 C) 99 F (37.2 C) 97.6 F (36.4 C)  TempSrc: Oral   Oral  Resp: 16 17 18 18   Height:      Weight:  199 lb (90.266 kg)    SpO2: 94% 92% 92% 94%    Intake/Output Summary (Last 24 hours) at 05/14/14 1050 Last data filed at 05/14/14 2353  Gross per 24 hour  Intake    360 ml  Output    100 ml  Net    260 ml   Filed Weights   05/11/14 2100 05/12/14 2100 05/13/14 1942  Weight: 198 lb 4.8 oz (89.948 kg) 199 lb 1.6 oz (90.311 kg) 199 lb (90.266 kg)    Exam: General: NAD, appears stated age, attending M.D. ambulating patient at home HEENT: [place], MMM.  Neck: Supple, no JVD, no masses  Cardiovascular: RRR, S1 S2 auscultated, no rubs, murmurs or gallops.  Respiratory: Some slight wheezing on exhalation, a few basilar crackles-with ambulation patient became short of breath and O2 saturation 87% Abdomen: Soft, nontender, nondistended, + bowel sounds  Extremities: warm dry without cyanosis clubbing or edema.  Neuro: AAOx3    Data Reviewed: Basic Metabolic Panel:  Recent Labs Lab 05/10/14 1943 05/11/14 0716 05/12/14 0600 05/14/14 0457  NA 136 137 136 137  K 3.8 3.4* 3.9 3.5  CL 103 102 102 103  CO2 25 24 26 26   GLUCOSE 113* 108* 110* 105*  BUN 18 16 18 16   CREATININE 1.69* 1.35 1.17 1.26  CALCIUM 9.1 8.5 8.8 8.6   Liver Function Tests:  Recent Labs Lab 05/10/14 1943 05/11/14 0716  AST 24 26  ALT 12 13  ALKPHOS 52 46  BILITOT 1.0 0.6  PROT 6.5 5.9*  ALBUMIN 3.4* 3.0*   No results for input(s): LIPASE, AMYLASE in the last 168 hours. No results for input(s): AMMONIA in the last 168 hours. CBC:  Recent Labs Lab 05/10/14 1943 05/11/14 0716 05/12/14 0600 05/14/14 0457  WBC 11.3* 13.4* 10.7* 6.7  NEUTROABS 8.6*  --   --   --   HGB 14.6 13.5 13.9 12.9*  HCT 42.9 40.7 42.7 39.7  MCV 91.9 92.5 94.7 92.3  PLT 169 180 183 183   Cardiac Enzymes: No  results for input(s): CKTOTAL, CKMB, CKMBINDEX, TROPONINI in the last 168 hours. BNP (last 3 results) No results for input(s): PROBNP in the last 8760 hours. CBG:  Recent Labs Lab 05/11/14 0746 05/13/14 1230 05/14/14 0737  GLUCAP 99 101* 99    Recent Results (from the past 240 hour(s))  Urine culture     Status: None   Collection Time: 05/10/14  7:57 PM  Result Value Ref Range Status   Specimen Description URINE, CLEAN CATCH  Final   Special Requests NONE  Final   Colony Count   Final    70,000 COLONIES/ML Performed at [facility]    Culture   Final    PROTEUS MIRABILIS Performed at [facility]    Report Status 05/12/2014 FINAL  Final   Organism ID, Bacteria PROTEUS MIRABILIS  Final      Susceptibility  Proteus mirabilis - MIC*    AMPICILLIN <=2 SENSITIVE Sensitive     CEFAZOLIN <=4 SENSITIVE Sensitive     CEFTRIAXONE <=1 SENSITIVE Sensitive     CIPROFLOXACIN <=0.25 SENSITIVE Sensitive     GENTAMICIN <=1 SENSITIVE Sensitive     LEVOFLOXACIN <=0.12 SENSITIVE Sensitive     NITROFURANTOIN 128 RESISTANT Resistant     TOBRAMYCIN <=1 SENSITIVE Sensitive     TRIMETH/SULFA <=20 SENSITIVE Sensitive     PIP/TAZO <=4 SENSITIVE Sensitive     * PROTEUS MIRABILIS  Blood culture (routine x 2)     Status: None (Preliminary result)   Collection Time: 05/10/14  9:45 PM  Result Value Ref Range Status   Specimen Description BLOOD RIGHT ARM  Final   Special Requests BOTTLES DRAWN AEROBIC AND ANAEROBIC 10 CC  Final   Culture   Final           BLOOD CULTURE RECEIVED NO GROWTH TO DATE CULTURE WILL BE HELD FOR 5 DAYS BEFORE ISSUING A FINAL NEGATIVE REPORT Performed at [facility]    Report Status PENDING  Incomplete  Blood culture (routine x 2)     Status: None (Preliminary result)   Collection Time: 05/10/14  9:45 PM  Result Value Ref Range Status   Specimen Description BLOOD RIGHT FOREARM  Final   Special Requests BOTTLES DRAWN AEROBIC AND ANAEROBIC 10 CC   Final   Culture   Final           BLOOD CULTURE RECEIVED NO GROWTH TO DATE CULTURE WILL BE HELD FOR 5 DAYS BEFORE ISSUING A FINAL NEGATIVE REPORT Performed at [facility]    Report Status PENDING  Incomplete     Studies: No results found.  Scheduled Meds: . antiseptic oral rinse  7 mL Mouth Rinse BID  . aspirin  81 mg Oral Daily  . cefTRIAXone (ROCEPHIN)  IV  1 g Intravenous Q24H  . cholecalciferol  1,000 Units Oral Daily  . folic acid  1 mg Oral Daily  . guaifenesin  200 mg Oral 6 times per day  . heparin  5,000 Units Subcutaneous 3 times per day  . multivitamin with minerals  1 tablet Oral Daily  . omega-3 acid ethyl esters  1 g Oral Daily  . sodium chloride  500 mL Intravenous Once  . sodium chloride  3 mL Intravenous Q12H  . thiamine  100 mg Oral Daily  . traMADol  50 mg Oral TID  . vitamin B-12  50 mcg Oral Daily   Continuous Infusions:   Active Problems:   Hyperlipemia   Essential hypertension, benign   CAP (community acquired pneumonia)   Erin Hearing, ANP  [facility] Pager [phone].  If 7PM-7AM, please contact night-coverage at [email or web address], password [facility] 05/14/2014, 10:50 AM  LOS: 4 days       Addendum  I personally evaluated patient on 05/14/2013 and agree with the above findings. Patient reporting not feeling well today. A follow up CXR showed persistent bibasilar opacities that could be slightly worse on the right. Since he reports feeling worse and with CXR showing possible progression of PNA, will change IV antibiotic therapy to Zosyn to broaden coverage. He still has rhonchi and crackles on exam.

## 2014-05-15 LAB — BASIC METABOLIC PANEL
Anion gap: 7 (ref 5–15)
BUN: 17 mg/dL (ref 6–23)
CO2: 31 mmol/L (ref 19–32)
Calcium: 8.9 mg/dL (ref 8.4–10.5)
Chloride: 99 mmol/L (ref 96–112)
Creatinine, Ser: 1.38 mg/dL — ABNORMAL HIGH (ref 0.50–1.35)
GFR calc Af Amer: 54 mL/min — ABNORMAL LOW (ref >90)
GFR calc non Af Amer: 46 mL/min — ABNORMAL LOW (ref >90)
Glucose, Bld: 109 mg/dL — ABNORMAL HIGH (ref 70–99)
Potassium: 3.6 mmol/L (ref 3.5–5.1)
Sodium: 137 mmol/L (ref 135–145)

## 2014-05-15 LAB — CBC
HCT: 41.7 % (ref 39.0–52.0)
Hemoglobin: 13.7 g/dL (ref 13.0–17.0)
MCH: 30.5 pg (ref 26.0–34.0)
MCHC: 32.9 g/dL (ref 30.0–36.0)
MCV: 92.9 fL (ref 78.0–100.0)
Platelets: 207 10*3/uL (ref 150–400)
RBC: 4.49 MIL/uL (ref 4.22–5.81)
RDW: 14.2 % (ref 11.5–15.5)
WBC: 8.6 10*3/uL (ref 4.0–10.5)

## 2014-05-15 LAB — GLUCOSE, CAPILLARY: Glucose-Capillary: 96 mg/dL (ref 70–99)

## 2014-05-15 MED ORDER — CEFUROXIME AXETIL 500 MG PO TABS: 500.0000 mg | ORAL_TABLET | Freq: Two times a day (BID) | ORAL | Status: DC

## 2014-05-15 MED ORDER — LOSARTAN POTASSIUM 25 MG PO TABS
25.0000 mg | ORAL_TABLET | Freq: Every day | ORAL | Status: DC
Start: 2014-05-15 — End: 2014-05-23

## 2014-05-15 MED ORDER — LOSARTAN POTASSIUM 50 MG PO TABS: 50.0000 mg | ORAL_TABLET | Freq: Every day | ORAL | Status: DC

## 2014-05-15 NOTE — Discharge Instructions (Signed)
Your Hyzaar has been changed to Losartan 50 mg daily.  The HCTZ portion was stopped in the hospital in order to improve your kidney function.  Dr. Silvio Pate will determine if the HCTZ should be resumed.  Please take Mucinex 600 mg - take 2 pills in the morning and in the evening for 3-5 more days.  See Dr Silvio Pate in 1 week to 10 days. Take care.

## 2014-05-15 NOTE — Progress Notes (Signed)
Pt discharged home per MD. Call to PA-C to send prescriptions electronically to [facility], [address] [place], [place]. Pt ambulated in hall on RA with O2 sats 90-92%. NSL discontinued with cath intact. Discharged instructions reviewed. Pt able to verbalize needing to f/u with PCP in 10 days and to call for appt. Pt escorted out via wheelchair. Manya Silvas, RN

## 2014-05-15 NOTE — Discharge Summary (Signed)
Physician Discharge Summary  Kyle [name] OIN:[id number] DOB: [birth year] DOA: 05/10/2014  PCP: Viviana Simpler, MD  Admit date: 05/10/2014 Discharge date: 05/15/2014  Time spent:  57minutes  Recommendations for Outpatient Follow-up:  1. Bmet/CBC 7-10 days after discharge 2. Please check blood pressure. HCTZ was discontinued this admission and losartan was decreased to 25 mg. 3. Home health physical therapy ordered.  Discharge Diagnoses:  Active Problems:   Hyperlipemia   Essential hypertension, benign   CAP (community acquired pneumonia)   Discharge Condition: Stable  History of present illness:  81 year old male patient who presented to the ER with complaints of increasing weakness associated with shortness of breath and cough. Fevers up to 101F. Also associated with chills. Sputum was yellow in color. Also apparently pleuritic type chest pain with movement. In the ER a low-grade temperature of 99.6 with O2 saturation 92% on room air. White count was elevated at 11,300 and chest x-ray was concerning for left lower lobe pneumonia.   Hospital Course:  Community acquired pneumonia Slow improvement on antibiotics-urinary strep antigen positive.  Was treated for 3 days with IV antibiotic therapy as an inpatient. Will be discharged on 4 more days of oral Ceftin. Patient's oxygen saturation is 97% on room air at the time of discharge. We recommend he continue supportive treatment with Mucinex twice a day  HTN Patient's blood pressures were soft initially 106/63 therefore antihypertensive agents held-blood pressure rebounded on 1/21 so antihypertensive agents were resumed but later discontinued after patient's blood pressure dropped significantly after 1 dose.  Will hold HCTZ at the time of discharge and decrease losartan. Request  the patient follow up with his primary care physician regarding blood pressure medications.  Acute on chronic renal failure Baseline renal function 15/1.5  -Initial presenting with creatinine 1.69, creatinine trending down to 1.17 on a.m. lab work.  We will request that his primary care physician continue to monitor his creatinine. HCTZ has been discontinued. Patient still on ARB.  Hypokalemia Resolved-Likely secondary to diuretic therapy  Physical deconditioning PT recommends home health physical therapy.  This was ordered prior to discharge.    Procedures:  None  Consultations:  None  Discharge Exam: Filed Vitals:   05/14/14 2010 05/15/14 0619 05/15/14 1019 05/15/14 1221  BP: 124/70 136/69 137/65   Pulse: 53 61 71   Temp: 98.9 F (37.2 C) 98.6 F (37 C) 97.3 F (36.3 C)   TempSrc:   Oral   Resp: 18 17 18    Height:      Weight: 89.359 kg (197 lb)     SpO2: 95% 95% 97% 90%   Filed Weights   05/12/14 2100 05/13/14 1942 05/14/14 2010  Weight: 90.311 kg (199 lb 1.6 oz) 90.266 kg (199 lb) 89.359 kg (197 lb)     General: Well-developed, elderly male, in no apparent distress, sitting up in bed Cardiovascular: Regular rate and rhythm, no murmurs rubs or gallops Respiratory: Coarse expiratory breath sounds. no increased  work of breathing Abdominal: Soft, nontender, nondistended, positive bowel sounds, no masses Extremities: 5 over 5 strength in each, no lower extremity edema.  Discharge Instructions  Diet recommendation:   Discharge Instructions    Diet - low sodium heart healthy    Complete by:  As directed      Increase activity slowly    Complete by:  As directed           Current Discharge Medication List    START taking these medications   Details  cefUROXime (CEFTIN)  500 MG tablet Take 1 tablet (500 mg total) by mouth 2 (two) times daily with a meal. Qty: 8 tablet, Refills: 0    losartan (COZAAR) 25 MG tablet Take 1 tablet (25 mg total) by mouth daily. Qty: 30 tablet, Refills: 0      CONTINUE these medications which have NOT CHANGED   Details  aspirin 81 MG tablet Take 81 mg by mouth daily.       Cholecalciferol (VITAMIN D) 1000 UNITS capsule Take 1,000 Units by mouth daily.      fish oil-omega-3 fatty acids 1000 MG capsule Take 1 g by mouth daily.    Red Yeast Rice 600 MG CAPS Take 1 capsule by mouth daily.     traMADol (ULTRAM) 50 MG tablet Take 1 tablet (50 mg total) by mouth 3 (three) times daily. Qty: 90 tablet, Refills: 0    vitamin B-12 (CYANOCOBALAMIN) 50 MCG tablet Take 50 mcg by mouth daily.      STOP taking these medications     losartan-hydrochlorothiazide (HYZAAR) 50-12.5 MG per tablet        Allergies  Allergen Reactions  . Simvastatin     REACTION: myalgias   Follow-up Information    Follow up with Viviana Simpler, MD In 10 days.   Specialties:  Internal Medicine, Pediatrics   Contact information:   [address] [place] [address] [phone]        The results of significant diagnostics from this hospitalization (including imaging, microbiology, ancillary and laboratory) are listed below for reference.    Significant Diagnostic Studies: Dg Chest 2 View  05/14/2014   CLINICAL DATA:  Subsequent encounter.  Pneumonia.  EXAM: CHEST  2 VIEW  COMPARISON:  05/12/2014, 05/10/2014, 01/23/2012.  FINDINGS: Bibasilar opacities persist, probably a little worsened on the right were there now is greater obscuration of the diaphragmatic contour. No effusions are evident. There is mild unchanged cardiomegaly. Hilar, mediastinal and cardiac contours are unchanged. Vasculature and interstitium are unchanged, without evidence of congestive failure.  IMPRESSION: Persistent bibasilar opacities, perhaps slightly worsened on the right. No effusions. No evidence of congestive failure.   Electronically Signed   By: Andreas Newport M.D.   On: 05/14/2014 10:58   Dg Chest 2 View  05/12/2014   CLINICAL DATA:  Shortness of breath, weakness, pneumonia  EXAM: CHEST  2 VIEW  COMPARISON:  05/10/2014  FINDINGS: Cardiomediastinal silhouette is stable. Elevation of the  right hemidiaphragm again noted. Stable bilateral basilar atelectasis or infiltrate. No pulmonary edema. Thoracic spine osteopenia. Degenerative changes thoracic spine.  IMPRESSION: There is chronic elevation of the right hemidiaphragm. Stable small bilateral basilar atelectasis or infiltrate. No pulmonary edema. Degenerative changes thoracic spine.   Electronically Signed   By: Lahoma Crocker M.D.   On: 05/12/2014 08:20   Dg Chest 2 View  05/10/2014   CLINICAL DATA:  Weakness and productive cough fnad fever x 3 days. Hx of pneumonia. Hx of HTN - on meds, prostate cancer, dizziness, stroke.  EXAM: CHEST  2 VIEW  COMPARISON:  01/23/2012  FINDINGS: Lung volumes are low. There is bronchovascular crowding at the bases. Additional opacity is also noted on the lateral view, which appears to be in left lower lobe. This may be atelectasis. Infiltrate is possible.  No pulmonary edema. Cardiac silhouette is top-normal in size. No mediastinal or hilar masses.  No pleural effusion or pneumothorax.  IMPRESSION: 1. Lung base opacity projecting in the left lower lobe. This is accentuated by low lung  volumes. It may all be atelectasis. Pneumonia is possible. No other evidence of an acute abnormality.   Electronically Signed   By: Lajean Manes M.D.   On: 05/10/2014 20:54    Microbiology: Recent Results (from the past 240 hour(s))  Urine culture     Status: None   Collection Time: 05/10/14  7:57 PM  Result Value Ref Range Status   Specimen Description URINE, CLEAN CATCH  Final   Special Requests NONE  Final   Colony Count   Final    70,000 COLONIES/ML Performed at [facility]    Culture   Final    PROTEUS MIRABILIS Performed at [facility]    Report Status 05/12/2014 FINAL  Final   Organism ID, Bacteria PROTEUS MIRABILIS  Final      Susceptibility   Proteus mirabilis - MIC*    AMPICILLIN <=2 SENSITIVE Sensitive     CEFAZOLIN <=4 SENSITIVE Sensitive     CEFTRIAXONE <=1 SENSITIVE Sensitive      CIPROFLOXACIN <=0.25 SENSITIVE Sensitive     GENTAMICIN <=1 SENSITIVE Sensitive     LEVOFLOXACIN <=0.12 SENSITIVE Sensitive     NITROFURANTOIN 128 RESISTANT Resistant     TOBRAMYCIN <=1 SENSITIVE Sensitive     TRIMETH/SULFA <=20 SENSITIVE Sensitive     PIP/TAZO <=4 SENSITIVE Sensitive     * PROTEUS MIRABILIS  Blood culture (routine x 2)     Status: None (Preliminary result)   Collection Time: 05/10/14  9:45 PM  Result Value Ref Range Status   Specimen Description BLOOD RIGHT ARM  Final   Special Requests BOTTLES DRAWN AEROBIC AND ANAEROBIC 10 CC  Final   Culture   Final           BLOOD CULTURE RECEIVED NO GROWTH TO DATE CULTURE WILL BE HELD FOR 5 DAYS BEFORE ISSUING A FINAL NEGATIVE REPORT Performed at [facility]    Report Status PENDING  Incomplete  Blood culture (routine x 2)     Status: None (Preliminary result)   Collection Time: 05/10/14  9:45 PM  Result Value Ref Range Status   Specimen Description BLOOD RIGHT FOREARM  Final   Special Requests BOTTLES DRAWN AEROBIC AND ANAEROBIC 10 CC  Final   Culture   Final           BLOOD CULTURE RECEIVED NO GROWTH TO DATE CULTURE WILL BE HELD FOR 5 DAYS BEFORE ISSUING A FINAL NEGATIVE REPORT Performed at [facility]    Report Status PENDING  Incomplete     Labs: Basic Metabolic Panel:  Recent Labs Lab 05/10/14 1943 05/11/14 0716 05/12/14 0600 05/14/14 0457 05/15/14 0535  NA 136 137 136 137 137  K 3.8 3.4* 3.9 3.5 3.6  CL 103 102 102 103 99  CO2 25 24 26 26 31   GLUCOSE 113* 108* 110* 105* 109*  BUN 18 16 18 16 17   CREATININE 1.69* 1.35 1.17 1.26 1.38*  CALCIUM 9.1 8.5 8.8 8.6 8.9   Liver Function Tests:  Recent Labs Lab 05/10/14 1943 05/11/14 0716  AST 24 26  ALT 12 13  ALKPHOS 52 46  BILITOT 1.0 0.6  PROT 6.5 5.9*  ALBUMIN 3.4* 3.0*   CBC:  Recent Labs Lab 05/10/14 1943 05/11/14 0716 05/12/14 0600 05/14/14 0457 05/15/14 0535  WBC 11.3* 13.4* 10.7* 6.7 8.6  NEUTROABS 8.6*  --   --    --   --   HGB 14.6 13.5 13.9 12.9* 13.7  HCT 42.9 40.7 42.7 39.7 41.7  MCV 91.9 92.5 94.7 92.3 92.9  PLT 169 180 183 183 207   CBG:  Recent Labs Lab 05/13/14 1230 05/14/14 0737 05/14/14 1149 05/14/14 1707 05/15/14 0724  GLUCAP 101* 99 105* 112* 96       Signed:  Melton Alar, PA-C  [facility] 05/15/2014, 12:29 PM   Addendum  Patient was seen and evaluated on 05/15/2014, plan formulated with Imogene Burn PA, and I agree with the above. Patient having improved lung sounds on day of discharge reporting feeling much better and felt ready to be discharged. Patient was treated with emperic IV antimicrobial therapy during this hospitalization and discharged on Ceftin therapy.

## 2014-05-16 DIAGNOSIS — I10 Essential (primary) hypertension: Secondary | ICD-10-CM | POA: Diagnosis not present

## 2014-05-16 DIAGNOSIS — J189 Pneumonia, unspecified organism: Secondary | ICD-10-CM | POA: Diagnosis not present

## 2014-05-16 DIAGNOSIS — E785 Hyperlipidemia, unspecified: Secondary | ICD-10-CM | POA: Diagnosis not present

## 2014-05-16 DIAGNOSIS — R262 Difficulty in walking, not elsewhere classified: Secondary | ICD-10-CM | POA: Diagnosis not present

## 2014-05-17 LAB — CULTURE, BLOOD (ROUTINE X 2)
Culture: NO GROWTH
Culture: NO GROWTH

## 2014-05-18 DIAGNOSIS — I10 Essential (primary) hypertension: Secondary | ICD-10-CM | POA: Diagnosis not present

## 2014-05-18 DIAGNOSIS — E785 Hyperlipidemia, unspecified: Secondary | ICD-10-CM | POA: Diagnosis not present

## 2014-05-18 DIAGNOSIS — R262 Difficulty in walking, not elsewhere classified: Secondary | ICD-10-CM | POA: Diagnosis not present

## 2014-05-18 DIAGNOSIS — J189 Pneumonia, unspecified organism: Secondary | ICD-10-CM | POA: Diagnosis not present

## 2014-05-19 DIAGNOSIS — I10 Essential (primary) hypertension: Secondary | ICD-10-CM | POA: Diagnosis not present

## 2014-05-19 DIAGNOSIS — J189 Pneumonia, unspecified organism: Secondary | ICD-10-CM | POA: Diagnosis not present

## 2014-05-19 DIAGNOSIS — R262 Difficulty in walking, not elsewhere classified: Secondary | ICD-10-CM | POA: Diagnosis not present

## 2014-05-19 DIAGNOSIS — E785 Hyperlipidemia, unspecified: Secondary | ICD-10-CM | POA: Diagnosis not present

## 2014-05-23 ENCOUNTER — Ambulatory Visit (INDEPENDENT_AMBULATORY_CARE_PROVIDER_SITE_OTHER): Payer: Medicare Other | Admitting: Internal Medicine

## 2014-05-23 ENCOUNTER — Encounter: Payer: Self-pay | Admitting: Internal Medicine

## 2014-05-23 VITALS — BP 124/78 | HR 58 | Temp 98.0°F | Resp 16 | Ht 72.0 in | Wt 197.5 lb

## 2014-05-23 DIAGNOSIS — I10 Essential (primary) hypertension: Secondary | ICD-10-CM

## 2014-05-23 DIAGNOSIS — G3184 Mild cognitive impairment, so stated: Secondary | ICD-10-CM | POA: Diagnosis not present

## 2014-05-23 DIAGNOSIS — R262 Difficulty in walking, not elsewhere classified: Secondary | ICD-10-CM | POA: Diagnosis not present

## 2014-05-23 DIAGNOSIS — J189 Pneumonia, unspecified organism: Secondary | ICD-10-CM | POA: Diagnosis not present

## 2014-05-23 DIAGNOSIS — E785 Hyperlipidemia, unspecified: Secondary | ICD-10-CM | POA: Diagnosis not present

## 2014-05-23 LAB — RENAL FUNCTION PANEL
Albumin: 3.7 g/dL (ref 3.5–5.2)
BUN: 25 mg/dL — ABNORMAL HIGH (ref 6–23)
CO2: 30 mEq/L (ref 19–32)
Calcium: 9.7 mg/dL (ref 8.4–10.5)
Chloride: 104 mEq/L (ref 96–112)
Creatinine, Ser: 1.37 mg/dL (ref 0.40–1.50)
GFR: 53 mL/min — ABNORMAL LOW (ref >60.00)
Glucose, Bld: 96 mg/dL (ref 70–99)
Phosphorus: 3.6 mg/dL (ref 2.3–4.6)
Potassium: 4 mEq/L (ref 3.5–5.1)
Sodium: 138 mEq/L (ref 135–145)

## 2014-05-23 NOTE — Patient Instructions (Signed)
Please set up a follow up chest x-ray in about a month.

## 2014-05-23 NOTE — Assessment & Plan Note (Signed)
No apparent change in memory or function since hospital

## 2014-05-23 NOTE — Progress Notes (Signed)
Subjective:    Patient ID: Kyle [name], male    DOB: [birth year], 81 y.o.   MRN: [id number]  HPI Here for follow up of hospitalization for pneumonia Here with wife  Done with antibiotics Still hasn't gotten his strength back Continues to work with PT May have some chest congestion still, per wife, but the rattle is gone  Still with some cough---better Very little sputum Back to baseline DOE---feels his breathing is back to his normal No fever since coming home Slight sweat and chills 2 nights ago--very brief  Current Outpatient Prescriptions on File Prior to Visit  Medication Sig Dispense Refill  . aspirin 81 MG tablet Take 81 mg by mouth daily.      . Cholecalciferol (VITAMIN D) 1000 UNITS capsule Take 1,000 Units by mouth daily.      . fish oil-omega-3 fatty acids 1000 MG capsule Take 1 g by mouth daily.    [name] losartan (COZAAR) 25 MG tablet Take 1 tablet (25 mg total) by mouth daily. 30 tablet 0  . Red Yeast Rice 600 MG CAPS Take 1 capsule by mouth daily.     . traMADol (ULTRAM) 50 MG tablet Take 1 tablet (50 mg total) by mouth 3 (three) times daily. 90 tablet 0  . vitamin B-12 (CYANOCOBALAMIN) 50 MCG tablet Take 50 mcg by mouth daily.     No current facility-administered medications on file prior to visit.    Allergies  Allergen Reactions  . Simvastatin     REACTION: myalgias    Past Medical History  Diagnosis Date  . History of prostate cancer   . Hyperlipidemia   . Hypertension   . Arthritis   . Impaired fasting glucose   . Detached retina     right  . H/O dizziness   . Urgency of urination   . Cancer     prostate CA  . GERD (gastroesophageal reflux disease)   . Pneumonia   . CAP (community acquired pneumonia) 05/10/2014  . Stroke ~ 1995-02/2009 X 2    /notes 08/22/2010    Past Surgical History  Procedure Laterality Date  . Prostate surgery  1996    Archie Endo 08/22/2010  . Tonsillectomy    . Colonoscopy w/ polypectomy    . Lumbar  laminectomy/decompression microdiscectomy  01/24/2012    Procedure: LUMBAR LAMINECTOMY/DECOMPRESSION MICRODISCECTOMY 1 LEVEL;  Surgeon: Winfield Cunas, MD;  Location: [facility];  Service: Neurosurgery;  Laterality: Right;  RIGHT Lumbar Three-Four far lateral diskectomy  . Eye surgery Right   . Retinal detachment repair w/ scleral buckle le Right 08/2006    Archie Endo 08/22/2010  . Cardiac catheterization  ~ 1995    Archie Endo 08/22/2010  . Hernia repair      hx UHR/notes 08/22/2010  . Inguinal hernia repair Left     Archie Endo 08/22/2010  . Umbilical hernia repair      hx/notes 08/22/2010    Family History  Problem Relation Age of Onset  . Heart disease Mother   . Coronary artery disease Mother   . Heart disease Father   . Coronary artery disease Father   . Heart disease Brother   . Diabetes Neg Hx   . Heart disease Brother   . Cancer Brother     prostate    History   Social History  . Marital Status: Married    Spouse Name: N/A    Number of Children: 1  . Years of Education: N/A   Occupational History  .  retired    Social History Main Topics  . Smoking status: Never Smoker   . Smokeless tobacco: Former Systems developer    Types: Chew    Quit date: 04/22/2004  . Alcohol Use: No  . Drug Use: No  . Sexual Activity: Not on file   Other Topics Concern  . Not on file   Social History Narrative   Has living will   Wife is health care POA   Would accept CPR but no prolonged machines.    Would not want a feeding tube if cognitively unaware              Review of Systems Appetite is good Bowels are fine--no diarrhea Sleep is not great--- wakes up after 2-3 hours. Is able to get back to sleep though    Objective:   Physical Exam  Constitutional: He appears well-developed and well-nourished. No distress.  Cardiovascular: Normal rate and normal heart sounds.  Exam reveals no gallop.   No murmur heard. Frequent ectopy and trigeminy at times  Pulmonary/Chest: Effort normal. No respiratory  distress. He has no wheezes.  Early inspiratory dry crackles at both bases  Musculoskeletal: He exhibits no edema or tenderness.  Psychiatric: He has a normal mood and affect. His behavior is normal.          Assessment & Plan:

## 2014-05-23 NOTE — Progress Notes (Signed)
Pre visit review using our clinic review tool, if applicable. No additional management support is needed unless otherwise documented below in the visit note. 

## 2014-05-23 NOTE — Assessment & Plan Note (Signed)
Improved clinically but still weak [facility] records and x-ray reviewed Done with antibiotics Will set up CXR for about a month

## 2014-05-23 NOTE — Assessment & Plan Note (Addendum)
BP Readings from Last 3 Encounters:  05/23/14 124/78  05/15/14 137/65  04/20/14 130/60   Was low in the hospital Off HCTZ and losartan lower--but he went home and continued the other pill and added the extra losartan Will just go back to his former Rx Will recheck renal

## 2014-05-24 ENCOUNTER — Telehealth: Payer: Self-pay

## 2014-05-24 NOTE — Telephone Encounter (Signed)
Kyle [name] with Kyle [name] wants to know if Dr Silvio Pate is the attending and following pts home health [place] request cb.

## 2014-05-24 NOTE — Telephone Encounter (Signed)
Yes I didn't discharge him but can approve and follow the home health

## 2014-05-24 NOTE — Telephone Encounter (Signed)
Kyle [name] notified as instructed by telephone and verbalized understanding. 

## 2014-05-25 ENCOUNTER — Encounter: Payer: Self-pay | Admitting: *Deleted

## 2014-05-25 DIAGNOSIS — J189 Pneumonia, unspecified organism: Secondary | ICD-10-CM | POA: Diagnosis not present

## 2014-05-25 DIAGNOSIS — E785 Hyperlipidemia, unspecified: Secondary | ICD-10-CM | POA: Diagnosis not present

## 2014-05-25 DIAGNOSIS — R262 Difficulty in walking, not elsewhere classified: Secondary | ICD-10-CM | POA: Diagnosis not present

## 2014-05-25 DIAGNOSIS — I10 Essential (primary) hypertension: Secondary | ICD-10-CM | POA: Diagnosis not present

## 2014-05-26 DIAGNOSIS — E785 Hyperlipidemia, unspecified: Secondary | ICD-10-CM | POA: Diagnosis not present

## 2014-05-26 DIAGNOSIS — J189 Pneumonia, unspecified organism: Secondary | ICD-10-CM | POA: Diagnosis not present

## 2014-05-26 DIAGNOSIS — R262 Difficulty in walking, not elsewhere classified: Secondary | ICD-10-CM | POA: Diagnosis not present

## 2014-05-26 DIAGNOSIS — I10 Essential (primary) hypertension: Secondary | ICD-10-CM | POA: Diagnosis not present

## 2014-05-27 ENCOUNTER — Other Ambulatory Visit: Payer: Self-pay | Admitting: Internal Medicine

## 2014-05-30 DIAGNOSIS — E785 Hyperlipidemia, unspecified: Secondary | ICD-10-CM | POA: Diagnosis not present

## 2014-05-30 DIAGNOSIS — R262 Difficulty in walking, not elsewhere classified: Secondary | ICD-10-CM | POA: Diagnosis not present

## 2014-05-30 DIAGNOSIS — I10 Essential (primary) hypertension: Secondary | ICD-10-CM | POA: Diagnosis not present

## 2014-05-30 DIAGNOSIS — J189 Pneumonia, unspecified organism: Secondary | ICD-10-CM | POA: Diagnosis not present

## 2014-05-30 NOTE — Telephone Encounter (Signed)
Approved: #90 x 0 

## 2014-05-30 NOTE — Telephone Encounter (Signed)
Last filed 04/20/14 #90--please advise

## 2014-05-30 NOTE — Telephone Encounter (Signed)
Rx called to pharmacy as instructed. 

## 2014-05-31 DIAGNOSIS — I10 Essential (primary) hypertension: Secondary | ICD-10-CM | POA: Diagnosis not present

## 2014-05-31 DIAGNOSIS — J189 Pneumonia, unspecified organism: Secondary | ICD-10-CM | POA: Diagnosis not present

## 2014-05-31 DIAGNOSIS — R262 Difficulty in walking, not elsewhere classified: Secondary | ICD-10-CM | POA: Diagnosis not present

## 2014-05-31 DIAGNOSIS — E785 Hyperlipidemia, unspecified: Secondary | ICD-10-CM

## 2014-06-01 ENCOUNTER — Telehealth: Payer: Self-pay

## 2014-06-01 DIAGNOSIS — I10 Essential (primary) hypertension: Secondary | ICD-10-CM | POA: Diagnosis not present

## 2014-06-01 DIAGNOSIS — E785 Hyperlipidemia, unspecified: Secondary | ICD-10-CM | POA: Diagnosis not present

## 2014-06-01 DIAGNOSIS — J189 Pneumonia, unspecified organism: Secondary | ICD-10-CM | POA: Diagnosis not present

## 2014-06-01 DIAGNOSIS — R262 Difficulty in walking, not elsewhere classified: Secondary | ICD-10-CM | POA: Diagnosis not present

## 2014-06-01 NOTE — Telephone Encounter (Signed)
[name] with [facility] left v/m; pt was admitted to [facility] on 05/16/14 following hospitalization for pneumonia. Pt is high risk for complications and hospitalization.  [name] request verbal order for home health nurse to do evaluation of pt.Please advise.

## 2014-06-02 NOTE — Telephone Encounter (Signed)
I thought he was already getting this from the hospital. It is appropriate if it needs to be me that gives the order

## 2014-06-02 NOTE — Telephone Encounter (Signed)
[facility] called again to get orders for home health nurse and pt.  She is requesting call back at [phone].  Thank you.

## 2014-06-02 NOTE — Telephone Encounter (Signed)
Spoke to [place] and provided verbal order. Only PT was ordered originally, but with pts COPD, and CHF pt is needing home health as well, as she feels he would greatly benefit from it

## 2014-06-04 DIAGNOSIS — J189 Pneumonia, unspecified organism: Secondary | ICD-10-CM | POA: Diagnosis not present

## 2014-06-04 DIAGNOSIS — E785 Hyperlipidemia, unspecified: Secondary | ICD-10-CM | POA: Diagnosis not present

## 2014-06-04 DIAGNOSIS — I10 Essential (primary) hypertension: Secondary | ICD-10-CM | POA: Diagnosis not present

## 2014-06-04 DIAGNOSIS — R262 Difficulty in walking, not elsewhere classified: Secondary | ICD-10-CM | POA: Diagnosis not present

## 2014-06-07 DIAGNOSIS — I10 Essential (primary) hypertension: Secondary | ICD-10-CM | POA: Diagnosis not present

## 2014-06-07 DIAGNOSIS — J189 Pneumonia, unspecified organism: Secondary | ICD-10-CM | POA: Diagnosis not present

## 2014-06-07 DIAGNOSIS — R262 Difficulty in walking, not elsewhere classified: Secondary | ICD-10-CM | POA: Diagnosis not present

## 2014-06-07 DIAGNOSIS — E785 Hyperlipidemia, unspecified: Secondary | ICD-10-CM | POA: Diagnosis not present

## 2014-06-09 ENCOUNTER — Other Ambulatory Visit: Payer: Self-pay | Admitting: Internal Medicine

## 2014-06-09 DIAGNOSIS — I10 Essential (primary) hypertension: Secondary | ICD-10-CM | POA: Diagnosis not present

## 2014-06-09 DIAGNOSIS — R262 Difficulty in walking, not elsewhere classified: Secondary | ICD-10-CM | POA: Diagnosis not present

## 2014-06-09 DIAGNOSIS — E785 Hyperlipidemia, unspecified: Secondary | ICD-10-CM | POA: Diagnosis not present

## 2014-06-09 DIAGNOSIS — J189 Pneumonia, unspecified organism: Secondary | ICD-10-CM | POA: Diagnosis not present

## 2014-06-10 DIAGNOSIS — E785 Hyperlipidemia, unspecified: Secondary | ICD-10-CM | POA: Diagnosis not present

## 2014-06-10 DIAGNOSIS — I10 Essential (primary) hypertension: Secondary | ICD-10-CM | POA: Diagnosis not present

## 2014-06-10 DIAGNOSIS — R262 Difficulty in walking, not elsewhere classified: Secondary | ICD-10-CM | POA: Diagnosis not present

## 2014-06-10 DIAGNOSIS — J189 Pneumonia, unspecified organism: Secondary | ICD-10-CM | POA: Diagnosis not present

## 2014-06-10 NOTE — Telephone Encounter (Signed)
Saw there was a note from last OV where pt was to restart previous dose--please advise to which dose is correct--

## 2014-06-13 DIAGNOSIS — E785 Hyperlipidemia, unspecified: Secondary | ICD-10-CM | POA: Diagnosis not present

## 2014-06-13 DIAGNOSIS — J189 Pneumonia, unspecified organism: Secondary | ICD-10-CM | POA: Diagnosis not present

## 2014-06-13 DIAGNOSIS — R262 Difficulty in walking, not elsewhere classified: Secondary | ICD-10-CM | POA: Diagnosis not present

## 2014-06-13 DIAGNOSIS — I10 Essential (primary) hypertension: Secondary | ICD-10-CM | POA: Diagnosis not present

## 2014-06-13 NOTE — Telephone Encounter (Signed)
He is on losartan/HCTZ---not plain losartan. This should not be refilled

## 2014-06-15 DIAGNOSIS — I10 Essential (primary) hypertension: Secondary | ICD-10-CM | POA: Diagnosis not present

## 2014-06-15 DIAGNOSIS — E785 Hyperlipidemia, unspecified: Secondary | ICD-10-CM | POA: Diagnosis not present

## 2014-06-15 DIAGNOSIS — J189 Pneumonia, unspecified organism: Secondary | ICD-10-CM | POA: Diagnosis not present

## 2014-06-15 DIAGNOSIS — R262 Difficulty in walking, not elsewhere classified: Secondary | ICD-10-CM | POA: Diagnosis not present

## 2014-06-16 DIAGNOSIS — E785 Hyperlipidemia, unspecified: Secondary | ICD-10-CM | POA: Diagnosis not present

## 2014-06-16 DIAGNOSIS — R262 Difficulty in walking, not elsewhere classified: Secondary | ICD-10-CM | POA: Diagnosis not present

## 2014-06-16 DIAGNOSIS — J189 Pneumonia, unspecified organism: Secondary | ICD-10-CM | POA: Diagnosis not present

## 2014-06-16 DIAGNOSIS — I10 Essential (primary) hypertension: Secondary | ICD-10-CM | POA: Diagnosis not present

## 2014-06-20 ENCOUNTER — Ambulatory Visit (INDEPENDENT_AMBULATORY_CARE_PROVIDER_SITE_OTHER)
Admission: RE | Admit: 2014-06-20 | Discharge: 2014-06-20 | Disposition: A | Discharge status: Home / Self Care | Payer: Medicare Other | Source: Ambulatory Visit | Attending: Internal Medicine | Admitting: Internal Medicine

## 2014-06-20 ENCOUNTER — Telehealth: Payer: Self-pay

## 2014-06-20 ENCOUNTER — Other Ambulatory Visit: Payer: Self-pay | Admitting: Internal Medicine

## 2014-06-20 DIAGNOSIS — J189 Pneumonia, unspecified organism: Secondary | ICD-10-CM | POA: Diagnosis not present

## 2014-06-20 NOTE — Telephone Encounter (Signed)
-----   Message from Venia Carbon, MD sent at 06/20/2014 10:28 AM EST ----- Please call him The chest x-ray is better but not back to normal. The radiologist thinks there just may be some scarring. As long as he feels okay, I would not take any action. I would not repeat this (since no suspicious findings) unless he felt sick or was short of breath Send him copy if he wishes

## 2014-06-20 NOTE — Telephone Encounter (Signed)
Patient aware of xray results and recommendations.

## 2014-06-28 ENCOUNTER — Other Ambulatory Visit: Payer: Self-pay | Admitting: Internal Medicine

## 2014-06-28 NOTE — Telephone Encounter (Signed)
Rx called to pharmacy as instructed. 

## 2014-06-28 NOTE — Telephone Encounter (Signed)
Received refill request electronically from pharmacy. Last refill 05/30/14 #90, last office visit 04/20/14. Is it okay to refill medication?

## 2014-06-30 DIAGNOSIS — E785 Hyperlipidemia, unspecified: Secondary | ICD-10-CM | POA: Diagnosis not present

## 2014-06-30 DIAGNOSIS — R262 Difficulty in walking, not elsewhere classified: Secondary | ICD-10-CM | POA: Diagnosis not present

## 2014-06-30 DIAGNOSIS — J189 Pneumonia, unspecified organism: Secondary | ICD-10-CM | POA: Diagnosis not present

## 2014-06-30 DIAGNOSIS — I10 Essential (primary) hypertension: Secondary | ICD-10-CM | POA: Diagnosis not present

## 2014-07-14 DIAGNOSIS — J189 Pneumonia, unspecified organism: Secondary | ICD-10-CM | POA: Diagnosis not present

## 2014-07-14 DIAGNOSIS — E785 Hyperlipidemia, unspecified: Secondary | ICD-10-CM | POA: Diagnosis not present

## 2014-07-14 DIAGNOSIS — R262 Difficulty in walking, not elsewhere classified: Secondary | ICD-10-CM | POA: Diagnosis not present

## 2014-07-14 DIAGNOSIS — I10 Essential (primary) hypertension: Secondary | ICD-10-CM | POA: Diagnosis not present

## 2014-07-26 ENCOUNTER — Other Ambulatory Visit: Payer: Self-pay | Admitting: Internal Medicine

## 2014-07-26 NOTE — Telephone Encounter (Signed)
Called in Rx to [facility]  

## 2014-07-26 NOTE — Telephone Encounter (Signed)
06/28/14 

## 2014-07-26 NOTE — Telephone Encounter (Signed)
Approved: #90 x 0 

## 2014-08-10 DIAGNOSIS — Z961 Presence of intraocular lens: Secondary | ICD-10-CM | POA: Diagnosis not present

## 2014-08-29 ENCOUNTER — Other Ambulatory Visit: Payer: Self-pay | Admitting: Internal Medicine

## 2014-08-29 NOTE — Telephone Encounter (Signed)
07/26/14 

## 2014-08-29 NOTE — Telephone Encounter (Signed)
rx called into pharmacy

## 2014-08-29 NOTE — Telephone Encounter (Signed)
Approved: #90 x 0 

## 2014-09-29 ENCOUNTER — Other Ambulatory Visit: Payer: Self-pay | Admitting: Internal Medicine

## 2014-09-29 NOTE — Telephone Encounter (Signed)
rx called into pharmacy

## 2014-09-29 NOTE — Telephone Encounter (Signed)
Last filled 08/29/14  [facility] PATIENT, Please send back to me for call in

## 2014-10-28 ENCOUNTER — Other Ambulatory Visit: Payer: Self-pay | Admitting: Internal Medicine

## 2014-10-28 NOTE — Telephone Encounter (Signed)
Approved: okay #[id number] x 0 

## 2014-10-28 NOTE — Telephone Encounter (Signed)
09/29/14 

## 2014-10-28 NOTE — Telephone Encounter (Signed)
rx called into pharmacy

## 2014-11-29 ENCOUNTER — Other Ambulatory Visit: Payer: Self-pay | Admitting: Internal Medicine

## 2014-11-29 NOTE — Telephone Encounter (Signed)
rx called into pharmacy

## 2014-11-29 NOTE — Telephone Encounter (Signed)
10/28/14 

## 2014-11-29 NOTE — Telephone Encounter (Signed)
Approved: okay #[id number] x 0 

## 2014-12-28 ENCOUNTER — Other Ambulatory Visit: Payer: Self-pay | Admitting: Internal Medicine

## 2014-12-28 NOTE — Telephone Encounter (Signed)
rx called into pharmacy

## 2014-12-28 NOTE — Telephone Encounter (Signed)
Approved: okay #[id number] x 0 

## 2014-12-28 NOTE — Telephone Encounter (Signed)
11/29/14 

## 2015-01-25 ENCOUNTER — Other Ambulatory Visit: Payer: Self-pay | Admitting: Internal Medicine

## 2015-01-25 NOTE — Telephone Encounter (Signed)
rx called into pharmacy

## 2015-01-25 NOTE — Telephone Encounter (Signed)
Approved: #90 x 0 

## 2015-01-25 NOTE — Telephone Encounter (Signed)
12/28/14 

## 2015-02-27 ENCOUNTER — Encounter (HOSPITAL_COMMUNITY): Payer: Self-pay | Admitting: Emergency Medicine

## 2015-02-27 ENCOUNTER — Emergency Department (HOSPITAL_COMMUNITY): Payer: Medicare Other

## 2015-02-27 ENCOUNTER — Inpatient Hospital Stay (HOSPITAL_COMMUNITY): Payer: Medicare Other

## 2015-02-27 ENCOUNTER — Inpatient Hospital Stay (HOSPITAL_COMMUNITY)
Admission: EM | Admit: 2015-02-27 | Discharge: 2015-03-03 | DRG: 246 | Disposition: A | Discharge status: Home Health | Payer: Medicare Other | Attending: Internal Medicine | Admitting: Internal Medicine

## 2015-02-27 DIAGNOSIS — E785 Hyperlipidemia, unspecified: Secondary | ICD-10-CM | POA: Diagnosis present

## 2015-02-27 DIAGNOSIS — I25119 Atherosclerotic heart disease of native coronary artery with unspecified angina pectoris: Secondary | ICD-10-CM

## 2015-02-27 DIAGNOSIS — I5022 Chronic systolic (congestive) heart failure: Secondary | ICD-10-CM | POA: Diagnosis present

## 2015-02-27 DIAGNOSIS — R0600 Dyspnea, unspecified: Secondary | ICD-10-CM

## 2015-02-27 DIAGNOSIS — I493 Ventricular premature depolarization: Secondary | ICD-10-CM | POA: Diagnosis not present

## 2015-02-27 DIAGNOSIS — Z720 Tobacco use: Secondary | ICD-10-CM | POA: Diagnosis not present

## 2015-02-27 DIAGNOSIS — I509 Heart failure, unspecified: Secondary | ICD-10-CM | POA: Diagnosis not present

## 2015-02-27 DIAGNOSIS — Z87891 Personal history of nicotine dependence: Secondary | ICD-10-CM

## 2015-02-27 DIAGNOSIS — J189 Pneumonia, unspecified organism: Secondary | ICD-10-CM | POA: Diagnosis not present

## 2015-02-27 DIAGNOSIS — N183 Chronic kidney disease, stage 3 unspecified: Secondary | ICD-10-CM | POA: Diagnosis present

## 2015-02-27 DIAGNOSIS — R300 Dysuria: Secondary | ICD-10-CM | POA: Diagnosis not present

## 2015-02-27 DIAGNOSIS — N189 Chronic kidney disease, unspecified: Secondary | ICD-10-CM | POA: Diagnosis not present

## 2015-02-27 DIAGNOSIS — I110 Hypertensive heart disease with heart failure: Secondary | ICD-10-CM | POA: Diagnosis not present

## 2015-02-27 DIAGNOSIS — I5021 Acute systolic (congestive) heart failure: Secondary | ICD-10-CM

## 2015-02-27 DIAGNOSIS — I2510 Atherosclerotic heart disease of native coronary artery without angina pectoris: Secondary | ICD-10-CM | POA: Diagnosis present

## 2015-02-27 DIAGNOSIS — Z8673 Personal history of transient ischemic attack (TIA), and cerebral infarction without residual deficits: Secondary | ICD-10-CM

## 2015-02-27 DIAGNOSIS — E875 Hyperkalemia: Secondary | ICD-10-CM | POA: Insufficient documentation

## 2015-02-27 DIAGNOSIS — I480 Paroxysmal atrial fibrillation: Secondary | ICD-10-CM | POA: Diagnosis not present

## 2015-02-27 DIAGNOSIS — I429 Cardiomyopathy, unspecified: Secondary | ICD-10-CM | POA: Diagnosis not present

## 2015-02-27 DIAGNOSIS — N179 Acute kidney failure, unspecified: Secondary | ICD-10-CM | POA: Insufficient documentation

## 2015-02-27 DIAGNOSIS — I10 Essential (primary) hypertension: Secondary | ICD-10-CM | POA: Diagnosis not present

## 2015-02-27 DIAGNOSIS — I130 Hypertensive heart and chronic kidney disease with heart failure and stage 1 through stage 4 chronic kidney disease, or unspecified chronic kidney disease: Secondary | ICD-10-CM | POA: Diagnosis not present

## 2015-02-27 DIAGNOSIS — E876 Hypokalemia: Secondary | ICD-10-CM | POA: Diagnosis not present

## 2015-02-27 DIAGNOSIS — R0602 Shortness of breath: Secondary | ICD-10-CM | POA: Diagnosis not present

## 2015-02-27 DIAGNOSIS — I214 Non-ST elevation (NSTEMI) myocardial infarction: Secondary | ICD-10-CM

## 2015-02-27 DIAGNOSIS — Z955 Presence of coronary angioplasty implant and graft: Secondary | ICD-10-CM

## 2015-02-27 DIAGNOSIS — I472 Ventricular tachycardia: Secondary | ICD-10-CM | POA: Diagnosis not present

## 2015-02-27 DIAGNOSIS — J9811 Atelectasis: Secondary | ICD-10-CM | POA: Diagnosis not present

## 2015-02-27 DIAGNOSIS — Z8546 Personal history of malignant neoplasm of prostate: Secondary | ICD-10-CM

## 2015-02-27 DIAGNOSIS — Z7982 Long term (current) use of aspirin: Secondary | ICD-10-CM | POA: Diagnosis not present

## 2015-02-27 DIAGNOSIS — R609 Edema, unspecified: Secondary | ICD-10-CM

## 2015-02-27 DIAGNOSIS — Z79899 Other long term (current) drug therapy: Secondary | ICD-10-CM

## 2015-02-27 DIAGNOSIS — R7989 Other specified abnormal findings of blood chemistry: Secondary | ICD-10-CM | POA: Diagnosis not present

## 2015-02-27 DIAGNOSIS — R778 Other specified abnormalities of plasma proteins: Secondary | ICD-10-CM | POA: Diagnosis present

## 2015-02-27 DIAGNOSIS — I4729 Other ventricular tachycardia: Secondary | ICD-10-CM | POA: Insufficient documentation

## 2015-02-27 DIAGNOSIS — I255 Ischemic cardiomyopathy: Secondary | ICD-10-CM | POA: Diagnosis present

## 2015-02-27 DIAGNOSIS — I25118 Atherosclerotic heart disease of native coronary artery with other forms of angina pectoris: Secondary | ICD-10-CM | POA: Insufficient documentation

## 2015-02-27 HISTORY — DX: Claustrophobia: F40.240

## 2015-02-27 LAB — BASIC METABOLIC PANEL
Anion gap: 10 (ref 5–15)
BUN: 14 mg/dL (ref 6–20)
CO2: 24 mmol/L (ref 22–32)
Calcium: 9.1 mg/dL (ref 8.9–10.3)
Chloride: 106 mmol/L (ref 101–111)
Creatinine, Ser: 1.54 mg/dL — ABNORMAL HIGH (ref 0.61–1.24)
GFR calc Af Amer: 47 mL/min — ABNORMAL LOW (ref >60)
GFR calc non Af Amer: 41 mL/min — ABNORMAL LOW (ref >60)
Glucose, Bld: 100 mg/dL — ABNORMAL HIGH (ref 65–99)
Potassium: 3.9 mmol/L (ref 3.5–5.1)
Sodium: 140 mmol/L (ref 135–145)

## 2015-02-27 LAB — CBC WITH DIFFERENTIAL/PLATELET
Basophils Absolute: 0 10*3/uL (ref 0.0–0.1)
Basophils Relative: 1 %
Eosinophils Absolute: 0.2 10*3/uL (ref 0.0–0.7)
Eosinophils Relative: 3 %
HCT: 44.9 % (ref 39.0–52.0)
Hemoglobin: 14.3 g/dL (ref 13.0–17.0)
Lymphocytes Relative: 28 %
Lymphs Abs: 2.4 10*3/uL (ref 0.7–4.0)
MCH: 30.1 pg (ref 26.0–34.0)
MCHC: 31.8 g/dL (ref 30.0–36.0)
MCV: 94.5 fL (ref 78.0–100.0)
Monocytes Absolute: 0.9 10*3/uL (ref 0.1–1.0)
Monocytes Relative: 11 %
Neutro Abs: 5.1 10*3/uL (ref 1.7–7.7)
Neutrophils Relative %: 57 %
Platelets: 222 10*3/uL (ref 150–400)
RBC: 4.75 MIL/uL (ref 4.22–5.81)
RDW: 15.3 % (ref 11.5–15.5)
WBC: 8.7 10*3/uL (ref 4.0–10.5)

## 2015-02-27 LAB — URINE MICROSCOPIC-ADD ON

## 2015-02-27 LAB — I-STAT TROPONIN, ED: Troponin i, poc: 0.11 ng/mL (ref 0.00–0.08)

## 2015-02-27 LAB — URINALYSIS, ROUTINE W REFLEX MICROSCOPIC
Bilirubin Urine: NEGATIVE
Glucose, UA: NEGATIVE mg/dL
Ketones, ur: NEGATIVE mg/dL
Leukocytes, UA: NEGATIVE
Nitrite: NEGATIVE
Protein, ur: NEGATIVE mg/dL
Specific Gravity, Urine: 1.012 (ref 1.005–1.030)
Urobilinogen, UA: 0.2 mg/dL (ref 0.0–1.0)
pH: 6 (ref 5.0–8.0)

## 2015-02-27 LAB — STREP PNEUMONIAE URINARY ANTIGEN: Strep Pneumo Urinary Antigen: NEGATIVE

## 2015-02-27 LAB — INFLUENZA PANEL BY PCR (TYPE A & B)
H1N1 flu by pcr: NOT DETECTED
Influenza A By PCR: NEGATIVE
Influenza B By PCR: NEGATIVE

## 2015-02-27 LAB — TSH: TSH: 3.319 u[IU]/mL (ref 0.350–4.500)

## 2015-02-27 LAB — BRAIN NATRIURETIC PEPTIDE: B Natriuretic Peptide: 1011.3 pg/mL — ABNORMAL HIGH (ref 0.0–100.0)

## 2015-02-27 LAB — TROPONIN I
Troponin I: 0.18 ng/mL — ABNORMAL HIGH (ref <0.031)
Troponin I: 0.17 ng/mL — ABNORMAL HIGH (ref <0.031)
Troponin I: 0.18 ng/mL — ABNORMAL HIGH (ref <0.031)

## 2015-02-27 MED ORDER — TECHNETIUM TC 99M DIETHYLENETRIAME-PENTAACETIC ACID: 31.6000 | Freq: Once | INTRAVENOUS | Status: DC | PRN

## 2015-02-27 MED ORDER — DIPHENHYDRAMINE HCL 25 MG PO CAPS: 25.0000 mg | ORAL_CAPSULE | Freq: Every evening | ORAL | Status: DC | PRN

## 2015-02-27 MED ORDER — OMEGA-3-ACID ETHYL ESTERS 1 G PO CAPS
1.0000 g | ORAL_CAPSULE | Freq: Every day | ORAL | Status: DC
  Administered 2015-02-27 – 2015-03-03 (×5): 1 g via ORAL
  Filled 2015-02-27 (×6): qty 1

## 2015-02-27 MED ORDER — LOSARTAN POTASSIUM 50 MG PO TABS
50.0000 mg | ORAL_TABLET | Freq: Every day | ORAL | Status: DC
  Administered 2015-02-27 – 2015-02-28 (×2): 50 mg via ORAL
  Filled 2015-02-27 (×2): qty 1

## 2015-02-27 MED ORDER — ZOLPIDEM TARTRATE 5 MG PO TABS: 5.0000 mg | ORAL_TABLET | Freq: Every evening | ORAL | Status: DC | PRN

## 2015-02-27 MED ORDER — VITAMIN B-12 100 MCG PO TABS
50.0000 ug | ORAL_TABLET | Freq: Every day | ORAL | Status: DC
  Administered 2015-02-27 – 2015-03-03 (×5): 50 ug via ORAL
  Filled 2015-02-27 (×7): qty 1

## 2015-02-27 MED ORDER — VITAMIN D 1000 UNITS PO TABS
1000.0000 [IU] | ORAL_TABLET | Freq: Every day | ORAL | Status: DC
  Administered 2015-02-27 – 2015-03-03 (×5): 1000 [IU] via ORAL
  Filled 2015-02-27 (×6): qty 1

## 2015-02-27 MED ORDER — AZITHROMYCIN 500 MG PO TABS
500.0000 mg | ORAL_TABLET | ORAL | Status: DC
  Administered 2015-02-27 – 2015-03-01 (×3): 500 mg via ORAL
  Filled 2015-02-27 (×4): qty 1

## 2015-02-27 MED ORDER — DEXTROSE 5 % IV SOLN
1.0000 g | INTRAVENOUS | Status: DC
  Administered 2015-02-27 – 2015-03-01 (×3): 1 g via INTRAVENOUS
  Filled 2015-02-27 (×4): qty 10

## 2015-02-27 MED ORDER — NICOTINE 21 MG/24HR TD PT24
21.0000 mg | MEDICATED_PATCH | Freq: Every day | TRANSDERMAL | Status: DC | PRN
  Administered 2015-03-03: 10:00:00 21 mg via TRANSDERMAL
  Filled 2015-02-27: qty 1

## 2015-02-27 MED ORDER — SODIUM CHLORIDE 0.9 % IJ SOLN
3.0000 mL | Freq: Two times a day (BID) | INTRAMUSCULAR | Status: DC
  Administered 2015-02-27 – 2015-03-01 (×5): 3 mL via INTRAVENOUS

## 2015-02-27 MED ORDER — SODIUM CHLORIDE 0.9 % IV SOLN: 250.0000 mL | INTRAVENOUS | Status: DC | PRN

## 2015-02-27 MED ORDER — GUAIFENESIN ER 600 MG PO TB12
1200.0000 mg | ORAL_TABLET | Freq: Two times a day (BID) | ORAL | Status: DC
  Administered 2015-02-27 – 2015-03-03 (×9): 1200 mg via ORAL
  Filled 2015-02-27 (×10): qty 2

## 2015-02-27 MED ORDER — DIPHENHYDRAMINE HCL 25 MG PO CAPS
25.0000 mg | ORAL_CAPSULE | Freq: Every evening | ORAL | Status: DC | PRN
  Administered 2015-02-27: 25 mg via ORAL
  Filled 2015-02-27 (×2): qty 1

## 2015-02-27 MED ORDER — ACETAMINOPHEN 325 MG PO TABS: 650.0000 mg | ORAL_TABLET | ORAL | Status: DC | PRN

## 2015-02-27 MED ORDER — TECHNETIUM TO 99M ALBUMIN AGGREGATED
3.3000 | Freq: Once | INTRAVENOUS | Status: AC | PRN
  Administered 2015-02-27: 3 via INTRAVENOUS

## 2015-02-27 MED ORDER — DEXTROMETHORPHAN POLISTIREX ER 30 MG/5ML PO SUER
30.0000 mg | Freq: Two times a day (BID) | ORAL | Status: DC | PRN
  Filled 2015-02-27: qty 5

## 2015-02-27 MED ORDER — METOPROLOL TARTRATE 12.5 MG HALF TABLET
12.5000 mg | ORAL_TABLET | Freq: Two times a day (BID) | ORAL | Status: DC
Start: 2015-02-27 — End: 2015-02-28
  Administered 2015-02-27 (×2): 12.5 mg via ORAL
  Filled 2015-02-27 (×2): qty 1

## 2015-02-27 MED ORDER — HYDROCHLOROTHIAZIDE 12.5 MG PO CAPS
12.5000 mg | ORAL_CAPSULE | Freq: Every day | ORAL | Status: DC
  Administered 2015-02-27 – 2015-02-28 (×2): 12.5 mg via ORAL
  Filled 2015-02-27 (×2): qty 1

## 2015-02-27 MED ORDER — FUROSEMIDE 10 MG/ML IJ SOLN
40.0000 mg | Freq: Two times a day (BID) | INTRAMUSCULAR | Status: DC
  Administered 2015-02-27 – 2015-02-28 (×2): 40 mg via INTRAVENOUS
  Filled 2015-02-27 (×2): qty 4

## 2015-02-27 MED ORDER — FUROSEMIDE 10 MG/ML IJ SOLN
40.0000 mg | Freq: Once | INTRAMUSCULAR | Status: AC
  Administered 2015-02-27: 40 mg via INTRAVENOUS
  Filled 2015-02-27: qty 4

## 2015-02-27 MED ORDER — ASPIRIN 81 MG PO CHEW
324.0000 mg | CHEWABLE_TABLET | Freq: Once | ORAL | Status: AC
  Administered 2015-02-27: 324 mg via ORAL
  Filled 2015-02-27: qty 4

## 2015-02-27 MED ORDER — ONDANSETRON HCL 4 MG/2ML IJ SOLN: 4.0000 mg | Freq: Four times a day (QID) | INTRAMUSCULAR | Status: DC | PRN

## 2015-02-27 MED ORDER — TRAMADOL HCL 50 MG PO TABS
50.0000 mg | ORAL_TABLET | Freq: Three times a day (TID) | ORAL | Status: DC
  Administered 2015-02-27 – 2015-03-03 (×11): 50 mg via ORAL
  Filled 2015-02-27 (×12): qty 1

## 2015-02-27 MED ORDER — POTASSIUM CHLORIDE CRYS ER 20 MEQ PO TBCR
40.0000 meq | EXTENDED_RELEASE_TABLET | Freq: Two times a day (BID) | ORAL | Status: DC
  Administered 2015-02-27 – 2015-03-01 (×6): 40 meq via ORAL
  Filled 2015-02-27 (×6): qty 2

## 2015-02-27 MED ORDER — ASPIRIN 81 MG PO CHEW
81.0000 mg | CHEWABLE_TABLET | Freq: Every day | ORAL | Status: DC
  Administered 2015-02-28 – 2015-03-03 (×3): 81 mg via ORAL
  Filled 2015-02-27 (×4): qty 1

## 2015-02-27 MED ORDER — LOSARTAN POTASSIUM-HCTZ 50-12.5 MG PO TABS: 1.0000 | ORAL_TABLET | Freq: Every day | ORAL | Status: DC

## 2015-02-27 MED ORDER — SODIUM CHLORIDE 0.9 % IJ SOLN: 3.0000 mL | INTRAMUSCULAR | Status: DC | PRN

## 2015-02-27 MED ORDER — NITROGLYCERIN 2 % TD OINT
1.0000 [in_us] | TOPICAL_OINTMENT | Freq: Once | TRANSDERMAL | Status: AC
  Administered 2015-02-27: 1 [in_us] via TOPICAL
  Filled 2015-02-27: qty 1

## 2015-02-27 MED ORDER — HEPARIN SODIUM (PORCINE) 5000 UNIT/ML IJ SOLN
5000.0000 [IU] | Freq: Three times a day (TID) | INTRAMUSCULAR | Status: DC
  Administered 2015-02-27 – 2015-02-28 (×3): 5000 [IU] via SUBCUTANEOUS
  Filled 2015-02-27 (×3): qty 1

## 2015-02-27 NOTE — Consult Note (Signed)
CONSULTATION NOTE  Reason for Consult: CHF  Requesting Physician: Dr. Catha Gosselin  Cardiologist: Dr. Antoine Poche (seen last in 2010)  HPI: This is a 81 y.o. male with a past medical history significant for remote cardiac catheterization in 2005 by Dr. Riley Kill (however no history of PCI or prior bypass), stroke 2, prostate cancer, hypertension, dyslipidemia, uncontrolled hypertension, CKD3 and impaired fasting glucose. He was last seen by Dr. Antoine Poche in the office in 2010, mostly for abnormal gait, dyspnea and orthostatic hypotension. He now presents with 3 days of worsening shortness of breath, orthopnea and PND. He's had a nonproductive cough and subjective fevers at home. He's also had some urinary hesitancy and dysuria. On admission BNP was elevated at 1011. Chest x-ray shows mild cardiomegaly with vascular pedicle widening. Radiologist interpretation is "chronic hypoventilation with bibasilar atelectasis or scarring. Superimposed pneumonia could not be obscured"? Blood pressure was elevated on admission. Troponin was also mildly elevated at 0.18 (POC). Then subsequently 0.11. Urinalysis indicates few bacteria, small amount of hemoglobin and few squamous cells. Urine culture is pending. Cardiology is asked to evaluate regarding congestive heart failure and mildly elevated troponin.  PMHx:  Past Medical History  Diagnosis Date  . History of prostate cancer   . Hyperlipidemia   . Hypertension   . Arthritis   . Impaired fasting glucose   . Detached retina     right  . H/O dizziness   . Urgency of urination   . Cancer [facility])     prostate CA  . GERD (gastroesophageal reflux disease)   . Pneumonia   . CAP (community acquired pneumonia) 05/10/2014  . Stroke [facility]) ~ 1995-02/2009 X 2    /notes 08/22/2010   Past Surgical History  Procedure Laterality Date  . Prostate surgery  1996    Hattie Perch 08/22/2010  . Tonsillectomy    . Colonoscopy w/ polypectomy    . Lumbar laminectomy/decompression  microdiscectomy  01/24/2012    Procedure: LUMBAR LAMINECTOMY/DECOMPRESSION MICRODISCECTOMY 1 LEVEL;  Surgeon: Carmela Hurt, MD;  Location: [facility] NEURO ORS;  Service: Neurosurgery;  Laterality: Right;  RIGHT Lumbar Three-Four far lateral diskectomy  . Eye surgery Right   . Retinal detachment repair w/ scleral buckle le Right 08/2006    Hattie Perch 08/22/2010  . Cardiac catheterization  ~ 1995    Hattie Perch 08/22/2010  . Hernia repair      hx UHR/notes 08/22/2010  . Inguinal hernia repair Left     Hattie Perch 08/22/2010  . Umbilical hernia repair      hx/notes 08/22/2010    FAMHx: Family History  Problem Relation Age of Onset  . Heart disease Mother   . Coronary artery disease Mother   . Heart disease Father   . Coronary artery disease Father   . Heart disease Brother   . Diabetes Neg Hx   . Heart disease Brother   . Cancer Brother     prostate    SOCHx:  reports that he has never smoked. He quit smokeless tobacco use about 10 years ago. His smokeless tobacco use included Chew. He reports that he does not drink alcohol or use illicit drugs.  ALLERGIES: Allergies  Allergen Reactions  . Simvastatin     REACTION: myalgias    ROS: A comprehensive review of systems was negative except for: Constitutional: positive for chills and fatigue Respiratory: positive for dyspnea on exertion Cardiovascular: positive for orthopnea and paroxysmal nocturnal dyspnea Genitourinary: positive for dysuria  HOME MEDICATIONS:   Medication List    ASK  your doctor about these medications        aspirin 81 MG tablet  Take 81 mg by mouth daily.     fish oil-omega-3 fatty acids 1000 MG capsule  Take 1 g by mouth daily.     losartan 25 MG tablet  Commonly known as:  COZAAR  TAKE 1 TABLET BY MOUTH EVERY DAY     losartan-hydrochlorothiazide 50-12.5 MG tablet  Commonly known as:  HYZAAR  Take 1 tablet by mouth daily.     Red Yeast Rice 600 MG Caps  Take 1 capsule by mouth daily.     traMADol 50 MG tablet    Commonly known as:  ULTRAM  TAKE 1 TABLET BY MOUTH 3 TIMES A DAY     vitamin B-12 50 MCG tablet  Commonly known as:  CYANOCOBALAMIN  Take 50 mcg by mouth daily.     Vitamin D 1000 UNITS capsule  Take 1,000 Units by mouth daily.        HOSPITAL MEDICATIONS: I have reviewed the patient's current medications.  VITALS: Blood pressure 174/93, pulse 93, temperature 97 F (36.1 C), temperature source Oral, resp. rate 20, height 6' (1.829 m), weight 202 lb 12.8 oz (91.989 kg), SpO2 98 %.  PHYSICAL EXAM: General appearance: alert, mild distress and coughing Neck: JVD - 5 cm above sternal notch and no carotid bruit Lungs: diminished breath sounds bibasilar and rales bibasilar Heart: irregular rhythm Abdomen: soft, non-tender; bowel sounds normal; no masses,  no organomegaly Extremities: edema trace RLE edema, notable variosities and chronic venous stasis changes Pulses: 2+ and symmetric Skin: Skin color, texture, turgor normal. No rashes or lesions Neurologic: Grossly normal Psych: Pleasant  LABS: Results for orders placed or performed during the hospital encounter of 02/27/15 (from the past 48 hour(s))  Basic metabolic panel     Status: Abnormal   Collection Time: 02/27/15  6:02 AM  Result Value Ref Range   Sodium 140 135 - 145 mmol/L   Potassium 3.9 3.5 - 5.1 mmol/L   Chloride 106 101 - 111 mmol/L   CO2 24 22 - 32 mmol/L   Glucose, Bld 100 (H) 65 - 99 mg/dL   BUN 14 6 - 20 mg/dL   Creatinine, Ser 1.54 (H) 0.61 - 1.24 mg/dL   Calcium 9.1 8.9 - 10.3 mg/dL   GFR calc non Af Amer 41 (L) >60 mL/min   GFR calc Af Amer 47 (L) >60 mL/min    Comment: (NOTE) The eGFR has been calculated using the CKD EPI equation. This calculation has not been validated in all clinical situations. eGFR's persistently <60 mL/min signify possible Chronic Kidney Disease.    Anion gap 10 5 - 15  CBC with Differential/Platelet     Status: None   Collection Time: 02/27/15  6:02 AM  Result Value Ref  Range   WBC 8.7 4.0 - 10.5 K/uL   RBC 4.75 4.22 - 5.81 MIL/uL   Hemoglobin 14.3 13.0 - 17.0 g/dL   HCT 44.9 39.0 - 52.0 %   MCV 94.5 78.0 - 100.0 fL   MCH 30.1 26.0 - 34.0 pg   MCHC 31.8 30.0 - 36.0 g/dL   RDW 15.3 11.5 - 15.5 %   Platelets 222 150 - 400 K/uL   Neutrophils Relative % 57 %   Neutro Abs 5.1 1.7 - 7.7 K/uL   Lymphocytes Relative 28 %   Lymphs Abs 2.4 0.7 - 4.0 K/uL   Monocytes Relative 11 %   Monocytes Absolute 0.9  0.1 - 1.0 K/uL   Eosinophils Relative 3 %   Eosinophils Absolute 0.2 0.0 - 0.7 K/uL   Basophils Relative 1 %   Basophils Absolute 0.0 0.0 - 0.1 K/uL  Troponin I     Status: Abnormal   Collection Time: 02/27/15  6:02 AM  Result Value Ref Range   Troponin I 0.18 (H) <0.031 ng/mL    Comment:        PERSISTENTLY INCREASED TROPONIN VALUES IN THE RANGE OF 0.04-0.49 ng/mL CAN BE SEEN IN:       -UNSTABLE ANGINA       -CONGESTIVE HEART FAILURE       -MYOCARDITIS       -CHEST TRAUMA       -ARRYHTHMIAS       -LATE PRESENTING MYOCARDIAL INFARCTION       -COPD   CLINICAL FOLLOW-UP RECOMMENDED.   Brain natriuretic peptide     Status: Abnormal   Collection Time: 02/27/15  6:03 AM  Result Value Ref Range   B Natriuretic Peptide 1011.3 (H) 0.0 - 100.0 pg/mL  I-stat troponin, ED     Status: Abnormal   Collection Time: 02/27/15  6:03 AM  Result Value Ref Range   Troponin i, poc 0.11 (HH) 0.00 - 0.08 ng/mL   Comment NOTIFIED PHYSICIAN    Comment 3            Comment: Due to the release kinetics of cTnI, a negative result within the first hours of the onset of symptoms does not rule out myocardial infarction with certainty. If myocardial infarction is still suspected, repeat the test at appropriate intervals.   Urinalysis, Routine w reflex microscopic (not at [facility])     Status: Abnormal   Collection Time: 02/27/15  8:14 AM  Result Value Ref Range   Color, Urine YELLOW YELLOW   APPearance CLEAR CLEAR   Specific Gravity, Urine 1.012 1.005 - 1.030   pH 6.0  5.0 - 8.0   Glucose, UA NEGATIVE NEGATIVE mg/dL   Hgb urine dipstick SMALL (A) NEGATIVE   Bilirubin Urine NEGATIVE NEGATIVE   Ketones, ur NEGATIVE NEGATIVE mg/dL   Protein, ur NEGATIVE NEGATIVE mg/dL   Urobilinogen, UA 0.2 0.0 - 1.0 mg/dL   Nitrite NEGATIVE NEGATIVE   Leukocytes, UA NEGATIVE NEGATIVE  Urine microscopic-add on     Status: Abnormal   Collection Time: 02/27/15  8:14 AM  Result Value Ref Range   Squamous Epithelial / LPF FEW (A) RARE   WBC, UA 3-6 <3 WBC/hpf   RBC / HPF 0-2 <3 RBC/hpf   Bacteria, UA FEW (A) RARE    IMAGING: Dg Chest Portable 1 View  02/27/2015  CLINICAL DATA:  Shortness of breath for 2 days EXAM: PORTABLE CHEST 1 VIEW COMPARISON:  06/20/2014 FINDINGS: Mild cardiomegaly with vascular pedicle widening. Stable aortic contours. Borderline pulmonary venous congestion. Chronic hypoventilation with indistinct basilar opacities. No asymmetric consolidation. No pneumothorax. IMPRESSION: Chronic hypoventilation with bibasilar atelectasis or scarring. Superimposed pneumonia could be obscured. Electronically Signed   By: Monte Fantasia M.D.   On: 02/27/2015 06:34   EKG: Sinus rhythm with RBBB and LAFB, occasional PACs and PVCs  HOSPITAL DIAGNOSES: Principal Problem:   Acute CHF (congestive heart failure) ([place]) Active Problems:   Hyperlipemia   Essential hypertension, benign   PROSTATE CANCER, HX OF   CAP (community acquired pneumonia)   Dysuria   Elevated troponin   CKD (chronic kidney disease), stage III   Tobacco abuse   Acute heart failure ([place])  IMPRESSION: 1. Acute congestive heart failure 2. Elevated troponin, may represent demand ischemia or acute coronary syndrome - awaiting serial troponins 3. CKD3 4. Uncontrolled hypertension  RECOMMENDATION: 1. Mr. Calderwood presents with several days of worsening shortness of breath and signs and symptoms concerning for heart failure. He is initially responded to diuretics. Troponin is mildly elevated and may  be due to congestive heart failure, chronic kidney disease and/or acute coronary syndrome. We will follow troponins. Plan for 2-D echocardiogram today. I agree with current treatment with diuretics. He is a ready on losartan and metoprolol. Will attempt to up titrate these medications. He may ultimately need noninvasive or invasive coronary testing.  Thanks for the consultation. Cardiology will follow along with you.  Time Spent Directly with Patient: 30 minutes  Pixie Casino, MD, [facility] Attending Cardiologist [place] 02/27/2015, 10:08 AM

## 2015-02-27 NOTE — Progress Notes (Signed)
  Echocardiogram 2D Echocardiogram has been performed.  Jennette Dubin 02/27/2015, 3:43 PM

## 2015-02-27 NOTE — Progress Notes (Signed)
VASCULAR LAB PRELIMINARY  PRELIMINARY  PRELIMINARY  PRELIMINARY  Bilateral lower extremity venous duplex completed.    Preliminary report:  There is no DVT or SVT noted in the bilateral lower extremities.   Kinjal Neitzke, RVT 02/27/2015, 2:55 PM

## 2015-02-27 NOTE — Progress Notes (Signed)
New admit, pt high fall risk, d/t pt stated he fell 2 weeks ago, pt refused bed alarm on, pt educated on importance of using call bell and/or calling staff, pt fall plan signed and at bedside, safety maintained

## 2015-02-27 NOTE — Progress Notes (Signed)
Utilization Review Completed.Kyle [name] T11/10/2014  

## 2015-02-27 NOTE — ED Provider Notes (Signed)
CSN: [id number]     Arrival date & time 02/27/15  0547 History   First MD Initiated Contact with Patient 02/27/15 [phone]     Chief Complaint  Patient presents with  . Shortness of Breath     Patient is a 81 y.o. male presenting with shortness of breath. The history is provided by the patient.  Shortness of Breath Severity:  Moderate Onset quality:  Gradual Duration:  1 day Timing:  Constant Progression:  Worsening Chronicity:  New Relieved by:  Rest Worsened by:  Exertion Associated symptoms: cough   Associated symptoms: no abdominal pain, no chest pain, no fever, no syncope and no vomiting   Patient reports for past day he has had increased cough/SOB He reports dyspnea on exertion No orthopnea reported No active CP He reports his legs "feel like jelly" when he walks but no falls reported   He denies hemoptysis He denies pleuritic/sharp CP at this time   Past Medical History  Diagnosis Date  . History of prostate cancer   . Hyperlipidemia   . Hypertension   . Arthritis   . Impaired fasting glucose   . Detached retina     right  . H/O dizziness   . Urgency of urination   . Cancer [facility])     prostate CA  . GERD (gastroesophageal reflux disease)   . Pneumonia   . CAP (community acquired pneumonia) 05/10/2014  . Stroke [facility]) ~ 1995-02/2009 X 2    /notes 08/22/2010   Past Surgical History  Procedure Laterality Date  . Prostate surgery  1996    Archie Endo 08/22/2010  . Tonsillectomy    . Colonoscopy w/ polypectomy    . Lumbar laminectomy/decompression microdiscectomy  01/24/2012    Procedure: LUMBAR LAMINECTOMY/DECOMPRESSION MICRODISCECTOMY 1 LEVEL;  Surgeon: Winfield Cunas, MD;  Location: [place] NEURO ORS;  Service: Neurosurgery;  Laterality: Right;  RIGHT Lumbar Three-Four far lateral diskectomy  . Eye surgery Right   . Retinal detachment repair w/ scleral buckle le Right 08/2006    Archie Endo 08/22/2010  . Cardiac catheterization  ~ 1995    Archie Endo 08/22/2010  . Hernia repair     hx UHR/notes 08/22/2010  . Inguinal hernia repair Left     Archie Endo 08/22/2010  . Umbilical hernia repair      hx/notes 08/22/2010   Family History  Problem Relation Age of Onset  . Heart disease Mother   . Coronary artery disease Mother   . Heart disease Father   . Coronary artery disease Father   . Heart disease Brother   . Diabetes Neg Hx   . Heart disease Brother   . Cancer Brother     prostate   Social History  Substance Use Topics  . Smoking status: Never Smoker   . Smokeless tobacco: Former Systems developer    Types: Chew    Quit date: 04/22/2004  . Alcohol Use: No    Review of Systems  Constitutional: Positive for fatigue. Negative for fever.  Respiratory: Positive for cough and shortness of breath.        Denies hemoptysis   Cardiovascular: Negative for chest pain, leg swelling and syncope.  Gastrointestinal: Negative for vomiting, abdominal pain and blood in stool.  Neurological: Negative for syncope.  All other systems reviewed and are negative.     Allergies  Simvastatin  Home Medications   Prior to Admission medications   Medication Sig Start Date End Date Taking? Authorizing Provider  aspirin 81 MG tablet Take 81 mg by  mouth daily.     Yes Historical Provider, MD  Cholecalciferol (VITAMIN D) 1000 UNITS capsule Take 1,000 Units by mouth daily.     Yes Historical Provider, MD  fish oil-omega-3 fatty acids 1000 MG capsule Take 1 g by mouth daily.   Yes Historical Provider, MD  losartan-hydrochlorothiazide (HYZAAR) 50-12.5 MG per tablet Take 1 tablet by mouth daily.   Yes Historical Provider, MD  Red Yeast Rice 600 MG CAPS Take 1 capsule by mouth daily.    Yes Historical Provider, MD  traMADol (ULTRAM) 50 MG tablet TAKE 1 TABLET BY MOUTH 3 TIMES A DAY 01/25/15  Yes Venia Carbon, MD  vitamin B-12 (CYANOCOBALAMIN) 50 MCG tablet Take 50 mcg by mouth daily.   Yes Historical Provider, MD  losartan (COZAAR) 25 MG tablet TAKE 1 TABLET BY MOUTH EVERY DAY Patient not taking:  Reported on 02/27/2015 07/26/14   Venia Carbon, MD   BP 154/106 mmHg  Pulse 79  Temp(Src) 97.5 F (36.4 C) (Oral)  Resp 30  Ht 6' (1.829 m)  Wt 190 lb (86.183 kg)  BMI 25.76 kg/m2  SpO2 94% Physical Exam CONSTITUTIONAL: Well developed/well nourished HEAD: Normocephalic/atraumatic EYES: EOMI ENMT: Mucous membranes moist NECK: supple no meningeal signs, +JVD SPINE/BACK:entire spine nontender CV: S1/S2 noted, no murmurs/rubs/gallops noted LUNGS: tachypnea noted.  Decreased BS noted bilaterally.   ABDOMEN: soft, nontender, no rebound or guarding, bowel sounds noted throughout abdomen GU:no cva tenderness NEURO: Pt is awake/alert/appropriate, moves all extremitiesx4.  No facial droop.   EXTREMITIES: pulses normal/equal, full ROM SKIN: warm, color normal PSYCH: no abnormalities of mood noted, alert and oriented to situation  ED Course  Procedures  CRITICAL CARE Performed by: Sharyon Cable Total critical care time: 31 minutes Critical care time was exclusive of separately billable procedures and treating other patients. Critical care was necessary to treat or prevent imminent or life-threatening deterioration. Critical care was time spent personally by me on the following activities: development of treatment plan with patient and/or surrogate as well as nursing, discussions with consultants, evaluation of patient's response to treatment, examination of patient, obtaining history from patient or surrogate, ordering and performing treatments and interventions, ordering and review of laboratory studies, ordering and review of radiographic studies, pulse oximetry and re-evaluation of patient's condition. PATIENT WITH TACHYPNEA (RESP RATE >30)  PT WITH ACUTE CHF ORDERED NITROGLYCERIN/LASIX/ASA FOR PATIENT PT WILL BE ADMITTED  Medications  nitroGLYCERIN (NITROGLYN) 2 % ointment 1 inch (not administered)  aspirin chewable tablet 324 mg (not administered)  furosemide (LASIX)  injection 40 mg (not administered)    7:16 AM Pt with increasing SOB He reports dyspnea on exertion Denies active CP He reports he can not sleep due to dyspnea suspect acute CHF No fever or leukocytosis to suggest acute pneumonia I doubt PE at this time.   Will admit 7:38 AM D/w triad Will admit to dr Aldine Contes service  Labs Review Labs Reviewed  BASIC METABOLIC PANEL - Abnormal; Notable for the following:    Glucose, Bld 100 (*)    Creatinine, Ser 1.54 (*)    GFR calc non Af Amer 41 (*)    GFR calc Af Amer 47 (*)    All other components within normal limits  BRAIN NATRIURETIC PEPTIDE - Abnormal; Notable for the following:    B Natriuretic Peptide 1011.3 (*)    All other components within normal limits  TROPONIN I - Abnormal; Notable for the following:    Troponin I 0.18 (*)    All  other components within normal limits  I-STAT TROPOININ, ED - Abnormal; Notable for the following:    Troponin i, poc 0.11 (*)    All other components within normal limits  CBC WITH DIFFERENTIAL/PLATELET    Imaging Review Dg Chest Portable 1 View  02/27/2015  CLINICAL DATA:  Shortness of breath for 2 days EXAM: PORTABLE CHEST 1 VIEW COMPARISON:  06/20/2014 FINDINGS: Mild cardiomegaly with vascular pedicle widening. Stable aortic contours. Borderline pulmonary venous congestion. Chronic hypoventilation with indistinct basilar opacities. No asymmetric consolidation. No pneumothorax. IMPRESSION: Chronic hypoventilation with bibasilar atelectasis or scarring. Superimposed pneumonia could be obscured. Electronically Signed   By: Monte Fantasia M.D.   On: 02/27/2015 06:34   I have personally reviewed and evaluated these images and lab results as part of my medical decision-making.   EKG Interpretation   Date/Time:  Monday February 27 2015 05:59:44 EST Ventricular Rate:  85 PR Interval:  169 QRS Duration: 111 QT Interval:  397 QTC Calculation: 472 R Axis:   -65 Text Interpretation:  Sinus  rhythm Paired ventricular premature complexes  Consider left atrial enlargement Abnormal R-wave progression, late  transition Inferior infarct, old Abnormal ekg No significant change since  last tracing Confirmed by Christy Gentles  MD, [place] ([id number]) on 02/27/2015  6:02:38 AM      MDM   Final diagnoses:  Acute congestive heart failure, unspecified congestive heart failure type (HCC)  Non-STEMI (non-ST elevated myocardial infarction) [facility])    Nursing notes including past medical history and social history reviewed and considered in documentation xrays/imaging reviewed by myself and considered during evaluation Labs/vital reviewed myself and considered during evaluation Previous records reviewed and considered     Ripley Fraise, MD 02/27/15 [phone]

## 2015-02-27 NOTE — Progress Notes (Signed)
Addendum:  Echo completed.   LVEF severely depressed at 15 - 20%  Imogene Burn, [place] [facility] Pager: [phone]

## 2015-02-27 NOTE — ED Notes (Signed)
SOB started yesterday morning.    Reports it started with weakness and diaphoresis and that he hasn't been able to sleep in 3 night.

## 2015-02-27 NOTE — H&P (Signed)
[facility] History and Physical  Kyle [name] DXI:[id number] DOB: 1931-09-24 DOA: 02/27/2015  Referring physician: Dr. Christy Gentles PCP: Kyle Simpler, MD   Chief Complaint: Shortness of breath  HPI: Kyle [name] is a 81 y.o. male with chronic kidney disease, hypertension, hyperlipidemia and pneumonia 1/16. He complains of increased dyspnea over the past three days. He has one pillow orthopnea but has been not been able to sleep due to shortness of breath associated with subjective fever, chills and non productive cough. The patient also endorses new dyspnea on exertion and increased generalized weakness. He also complains on pain with urination and suprapubic pain. He denies chest pain, nausea, vomiting, anorexia, diarrhea, and constipation. The patient is a former smoker x 20 years and currently uses oral tobacco 1bag/3 days. On exam the patient has mild right lower extremity edema, suprapubic tenderness to palpation and BP is 175/107.  In the ER chest xray shows borderline pulm venous congestion and possible PNA, BNP is 1000+, trop is 0.18  Review of Systems  Constitutional: Positive for fever, chills, malaise/fatigue and diaphoresis.  HENT: Positive for sore throat.   Eyes: Negative.   Respiratory: Positive for cough and shortness of breath.   Cardiovascular: Positive for orthopnea.  Gastrointestinal: Positive for abdominal pain.  Genitourinary: Positive for dysuria.  Skin: Negative.   Neurological: Positive for weakness.   Past Medical History  Diagnosis Date  . History of prostate cancer   . Hyperlipidemia   . Hypertension   . Arthritis   . Impaired fasting glucose   . Detached retina     right  . H/O dizziness   . Urgency of urination   . Cancer [facility])     prostate CA  . GERD (gastroesophageal reflux disease)   . Pneumonia   . CAP (community acquired pneumonia) 05/10/2014  . Stroke [facility]) ~ 1995-02/2009 X 2    /notes 08/22/2010   Past Surgical History  Procedure  Laterality Date  . Prostate surgery  1996    Archie Endo 08/22/2010  . Tonsillectomy    . Colonoscopy w/ polypectomy    . Lumbar laminectomy/decompression microdiscectomy  01/24/2012    Procedure: LUMBAR LAMINECTOMY/DECOMPRESSION MICRODISCECTOMY 1 LEVEL;  Surgeon: Winfield Cunas, MD;  Location: [place] [facility];  Service: Neurosurgery;  Laterality: Right;  RIGHT Lumbar Three-Four far lateral diskectomy  . Eye surgery Right   . Retinal detachment repair w/ scleral buckle le Right 08/2006    Archie Endo 08/22/2010  . Cardiac catheterization  ~ 1995    Archie Endo 08/22/2010  . Hernia repair      hx UHR/notes 08/22/2010  . Inguinal hernia repair Left     Archie Endo 08/22/2010  . Umbilical hernia repair      hx/notes 08/22/2010   Social History:  reports that he has never smoked. He quit smokeless tobacco use about 10 years ago. His smokeless tobacco use included Chew. He reports that he does not drink alcohol or use illicit drugs.  Smoked Cigarettes for 20 years before starting to use bagged tobacco.  Lives at home with wife.  Independent with ADLs.  Allergies  Allergen Reactions  . Simvastatin     REACTION: myalgias    Family History  Problem Relation Age of Onset  . Heart disease Mother   . Coronary artery disease Mother   . Heart disease Father   . Coronary artery disease Father   . Heart disease Brother   . Diabetes Neg Hx   . Heart disease Brother   . Cancer  Brother     prostate    Prior to Admission medications   Medication Sig Start Date End Date Taking? Authorizing Provider  aspirin 81 MG tablet Take 81 mg by mouth daily.     Yes Historical Provider, MD  Cholecalciferol (VITAMIN D) 1000 UNITS capsule Take 1,000 Units by mouth daily.     Yes Historical Provider, MD  fish oil-omega-3 fatty acids 1000 MG capsule Take 1 g by mouth daily.   Yes Historical Provider, MD  losartan-hydrochlorothiazide (HYZAAR) 50-12.5 MG per tablet Take 1 tablet by mouth daily.   Yes Historical Provider, MD  Red Yeast Rice 600  MG CAPS Take 1 capsule by mouth daily.    Yes Historical Provider, MD  traMADol (ULTRAM) 50 MG tablet TAKE 1 TABLET BY MOUTH 3 TIMES A DAY 01/25/15  Yes Venia Carbon, MD  vitamin B-12 (CYANOCOBALAMIN) 50 MCG tablet Take 50 mcg by mouth daily.   Yes Historical Provider, MD  losartan (COZAAR) 25 MG tablet TAKE 1 TABLET BY MOUTH EVERY DAY Patient not taking: Reported on 02/27/2015 07/26/14   Venia Carbon, MD   Physical Exam: Filed Vitals:   02/27/15 0700 02/27/15 0715 02/27/15 0730 02/27/15 0745  BP: 157/109 161/103 165/98 168/85  Pulse: 83 86 82 87  Temp:      TempSrc:      Resp: 31 29 21 19   Height:      Weight:      SpO2: 94% 95% 95% 96%    Wt Readings from Last 3 Encounters:  02/27/15 86.183 kg (190 lb)  05/23/14 89.585 kg (197 lb 8 oz)  05/14/14 89.359 kg (197 lb)    General:  Pleasant, Elderly male, Appears mildly SOB.   Wife at bedside. Eyes: PERRL, normal lids, irises & conjunctiva ENT: grossly normal hearing, lips & tongue, black staining of tongue. Neck: no LAD, masses or thyromegaly Cardiovascular: RRR, no m/r/g. +RLE edema.  No JVD. Respiratory: CTA bilaterally, no w/r/r. Air movement diminished. Abdomen: soft, tender in the hypogastric area, no masses, decreased bowel sounds. Skin: no rash or induration seen on limited exam Musculoskeletal: grossly normal tone BUE/BLE Psychiatric: grossly normal mood and affect, speech fluent and appropriate Neurologic: grossly non-focal.          Labs on Admission:  Basic Metabolic Panel:  Recent Labs Lab 02/27/15 0602  NA 140  K 3.9  CL 106  CO2 24  GLUCOSE 100*  BUN 14  CREATININE 1.54*  CALCIUM 9.1     Recent Labs Lab 02/27/15 0602  WBC 8.7  NEUTROABS 5.1  HGB 14.3  HCT 44.9  MCV 94.5  PLT 222   Cardiac Enzymes:  Recent Labs Lab 02/27/15 0602  TROPONINI 0.18*    BNP (last 3 results)  Recent Labs  02/27/15 0603  BNP 1011.3*    Radiological Exams on Admission: Dg Chest Portable 1  View  02/27/2015  CLINICAL DATA:  Shortness of breath for 2 days EXAM: PORTABLE CHEST 1 VIEW COMPARISON:  06/20/2014 FINDINGS: Mild cardiomegaly with vascular pedicle widening. Stable aortic contours. Borderline pulmonary venous congestion. Chronic hypoventilation with indistinct basilar opacities. No asymmetric consolidation. No pneumothorax. IMPRESSION: Chronic hypoventilation with bibasilar atelectasis or scarring. Superimposed pneumonia could be obscured. Electronically Signed   By: Monte Fantasia M.D.   On: 02/27/2015 06:34    EKG: Independently reviewed. Sinus Rhythm, No frank ST changes, +PVC, QT borderline.  Assessment/Plan Principal Problem:   Acute CHF (congestive heart failure) (HCC) Active Problems:   Hyperlipemia  Essential hypertension, benign   PROSTATE CANCER, HX OF   CAP (community acquired pneumonia)   Dysuria   Elevated troponin   CKD (chronic kidney disease), stage III   Dyspnea Possibly multifactorial:  CHF, PNA, PE.  Will check appropriate diagnostics and treat underlying issues.  Acute CHF New diagnosis.  +DOE, +Orthopnea, BNP 1000+ Check 2D echo, cycle troponin, daily weight, strict I and O, Low salt diet. Cardiology consultation requested from [facility].  Elevated troponin W/O chest pain or palpitations.  Likely due to heart failure.  Will cycle troponin Appreciate cardiology input.  Pneumonia Fever, dry cough.  Afebrile here, wbc not elevated.  CXR shows possible PNA.  Check Influenza panel. Placed on Azith / rocephin.  Check sputum culture, urine for legionella and strep (he was positive for strep in January).  Dysuria in the setting of a history of prostate cancer Bladder scan to rule out urinary retention.   U/a and culture pending.  Antibiotics for PNA will cover UTI.  Dyspnea with RLE swelling Rule out DVT / PE.  Checking Dopplers / V/Q scan.  CKD stage III Creatinine mildly elevated over baseline of 1.37.  Monitor closely.  He is on  ARB and will be diuresed with IV lasix.  Tobacco abuse Will counsel and offer nicotine patch   Consultants Called:    cardiology  Family Communication:     Wife at bedside.  Code Status:    Full.  Condition:    Guarded.  Potential Disposition:   To home vs SNF in 3-4 days.  Time spent in minutes : [place], PA-S, [address] [place],  [place] on 02/27/2015 at 8:14 AM Between 7am to 7pm - Pager - [phone] After 7pm go to [email or web address] - password [email or web address] And look for the night coverage person covering me after hours

## 2015-02-28 ENCOUNTER — Inpatient Hospital Stay (HOSPITAL_COMMUNITY): Payer: Medicare Other

## 2015-02-28 DIAGNOSIS — I480 Paroxysmal atrial fibrillation: Secondary | ICD-10-CM | POA: Insufficient documentation

## 2015-02-28 DIAGNOSIS — I255 Ischemic cardiomyopathy: Secondary | ICD-10-CM | POA: Insufficient documentation

## 2015-02-28 LAB — HEPARIN LEVEL (UNFRACTIONATED): Heparin Unfractionated: 0.58 IU/mL (ref 0.30–0.70)

## 2015-02-28 LAB — BASIC METABOLIC PANEL
Anion gap: 8 (ref 5–15)
BUN: 18 mg/dL (ref 6–20)
CO2: 28 mmol/L (ref 22–32)
Calcium: 9.5 mg/dL (ref 8.9–10.3)
Chloride: 104 mmol/L (ref 101–111)
Creatinine, Ser: 1.63 mg/dL — ABNORMAL HIGH (ref 0.61–1.24)
GFR calc Af Amer: 44 mL/min — ABNORMAL LOW (ref >60)
GFR calc non Af Amer: 38 mL/min — ABNORMAL LOW (ref >60)
Glucose, Bld: 108 mg/dL — ABNORMAL HIGH (ref 65–99)
Potassium: 4.1 mmol/L (ref 3.5–5.1)
Sodium: 140 mmol/L (ref 135–145)

## 2015-02-28 LAB — URINE CULTURE

## 2015-02-28 LAB — PROCALCITONIN: Procalcitonin: <0.1 ng/mL

## 2015-02-28 MED ORDER — DILTIAZEM HCL 100 MG IV SOLR: 5.0000 mg/h | INTRAVENOUS | Status: DC

## 2015-02-28 MED ORDER — HEPARIN (PORCINE) IN NACL 100-0.45 UNIT/ML-% IJ SOLN
1050.0000 [IU]/h | INTRAMUSCULAR | Status: DC
  Administered 2015-02-28: 1200 [IU]/h via INTRAVENOUS
  Administered 2015-03-02: 1050 [IU]/h via INTRAVENOUS
  Filled 2015-02-28 (×3): qty 250

## 2015-02-28 MED ORDER — HEPARIN BOLUS VIA INFUSION
2000.0000 [IU] | Freq: Once | INTRAVENOUS | Status: AC
  Administered 2015-02-28: 2000 [IU] via INTRAVENOUS
  Filled 2015-02-28: qty 2000

## 2015-02-28 MED ORDER — FUROSEMIDE 10 MG/ML IJ SOLN
40.0000 mg | Freq: Three times a day (TID) | INTRAMUSCULAR | Status: DC
  Administered 2015-02-28 – 2015-03-01 (×3): 40 mg via INTRAVENOUS
  Filled 2015-02-28 (×3): qty 4

## 2015-02-28 MED ORDER — METOPROLOL TARTRATE 25 MG PO TABS
37.5000 mg | ORAL_TABLET | Freq: Two times a day (BID) | ORAL | Status: DC
  Administered 2015-02-28 – 2015-03-03 (×6): 37.5 mg via ORAL
  Filled 2015-02-28 (×14): qty 1

## 2015-02-28 MED ORDER — METOPROLOL TARTRATE 25 MG PO TABS
25.0000 mg | ORAL_TABLET | Freq: Two times a day (BID) | ORAL | Status: DC
  Administered 2015-02-28: 25 mg via ORAL
  Filled 2015-02-28: qty 1

## 2015-02-28 NOTE — Progress Notes (Signed)
Kyle [name] was informed of the change of rhythm and ordered to increase the dose of Metoprolol to 25mg  PO BID from 12.5mg  BID. Will continue to monitor pt

## 2015-02-28 NOTE — Progress Notes (Signed)
[place] for Heparin Indication: atrial fibrillation  Allergies  Allergen Reactions  . Simvastatin     REACTION: myalgias    Patient Measurements: Height: 6' (182.9 cm) Weight: 193 lb 15 oz (87.971 kg) (scale C) IBW/kg (Calculated) : 77.6 Heparin Dosing Weight:  88 kg  Vital Signs: Temp: 97.4 F (36.3 C) (11/08 1242) Temp Source: Oral (11/08 1242) BP: 143/108 mmHg (11/08 1845) Pulse Rate: 115 (11/08 1845)  Labs:  Recent Labs  02/27/15 0602 02/27/15 1040 02/27/15 1651 02/28/15 0149 02/28/15 1924  HGB 14.3  --   --   --   --   HCT 44.9  --   --   --   --   PLT 222  --   --   --   --   HEPARINUNFRC  --   --   --   --  0.58  CREATININE 1.54*  --   --  1.63*  --   TROPONINI 0.18* 0.17* 0.18*  --   --     Estimated Creatinine Clearance: 39 mL/min (by C-G formula based on Cr of 1.63).   Assessment: 81 year old male continues on heparin for Afib. Initial heparin level therapeutic at 0.58  Goal of Therapy:  Heparin level 0.3-0.7 units/ml Monitor platelets by anticoagulation protocol: Yes   Plan:  Continue heparin at 1200 units / hr Next heparin level in AM  Thank you Anette Guarneri, PharmD [phone] 02/28/2015,8:06 PM

## 2015-02-28 NOTE — Progress Notes (Signed)
Called about patient with HR sometimes into 140's, mostly 110-120 in a-fib. Will increase lopressor to 37.5 mg BID (EF 15-20%). Give evening dose now. Patient is on IV heparin.  Pixie Casino, MD, [facility] Attending Cardiologist [place]

## 2015-02-28 NOTE — Progress Notes (Signed)
PROGRESS NOTE    Kyle [name] YYT:[id number] DOB: October 19, 1931 DOA: 02/27/2015 PCP: Viviana Simpler, MD  HPI/Brief narrative 81 year old male patient with history of HTN, HLD, stage III CKD, impaired glucose tolerance, prostate cancer, stroke presented to [facility] ED on 02/27/15 with progressively worsening dyspnea, orthopnea, PND, nonproductive cough and subjective fevers-all ongoing for 3-4 weeks PTA but worsened 3 days prior to admission. Admitted for new onset acute systolic CHF/cardiomyopathy. Cardiology consulting.   Assessment/Plan:  Acute systolic CHF/new cardiomyopathy - Started on IV Lasix which was increased today to 40 mg TID. Strict intake and output and daily weight monitoring. - DC HCTZ. - Continue losartan and metoprolol. - 2-D echo results as below. LVEF 50-20 percent. - Cardiology consultation and follow-up appreciated. Considering cardiac cath when better, possibly 03/02/15 - VQ scan and lower extremity venous Dopplers negative.  New onset A. fib with RVR - CHADSVASC 5 - Continue metoprolol. Cardiology started IV heparin but will need long-term anticoagulation. They are also considering adding digoxin versus amiodarone.  Elevated troponin - Flat trend. Maybe demand ischemia from decompensated CHF and new onset A. fib with RVR  Essential hypertension - Controlled. Continue metoprolol and losartan.  Hyperlipidemia - Check lipids  Stage III chronic kidney disease - Last creatinine prior to this hospitalization was 1.37 on 05/23/14. Admitted with creatinine of 1.54-progression of stage III chronic kidney disease versus mild acute on chronic kidney disease. - Creatinine has slightly increased to 1.63. Monitor closely while patient being diuresed. - Check renal ultrasound to rule out hydronephrosis.  History of prostate cancer  History of CVA - will need long-term anticoagulation in the context of newly diagnosed A. Fib  Questionable pneumonia - All his initial  presentation may have been related to acute CHF. - Influenza panel PCR: Negative, blood cultures 2: Negative to date, urine Legionella antigen: Pending, urinary pneumococcal antigen: Negative -For now continue empirically started IV Rocephin and azithromycin. - Check pro calcitonin and if it is low and follow-up chest x-ray in a.m. shows rapid improvement, consider discontinuing or narrowing antibiotics.   Dysuria - Urine microscopy not suggestive of UTI. Seems to have improved.  Tobacco abuse - Cessation counseling.       DVT prophylaxis: Started on therapeutic dose IV heparin  Code Status: Full Family Communication: Discussed with spouse at bedside on 11/8.  Disposition Plan: DC home when medically stable.    Consultants:  2 D Echo 02/27/2015: Study Conclusions  - Left ventricle: The cavity size was normal. Wall thickness was increased in a pattern of mild LVH. - Mitral valve: There was mild regurgitation. - Right ventricle: Systolic function was mildly reduced. - Tricuspid valve: There was moderate regurgitation.  - Left ventricle: LVEF is severely depressed at 15 to 20%. The cavity size was normal. Wall thickness was  increased in a pattern of mild LVH.  Bilateral lower extremity venous duplex completed.   Preliminary report: There is no DVT or SVT noted in the bilateral lower extremities.   Procedures:  None  Antibiotics:  Azithromycin 11/7 >  IV Rocephin 11/7 >   Subjective: Feels better compared to yesterday with improved dyspnea and minimal nonproductive cough. No chest pain reported. Urinating.   Objective: Filed Vitals:   02/28/15 0112 02/28/15 0431 02/28/15 0743 02/28/15 1242  BP: 129/97 128/86 120/87 120/95  Pulse: 97 109 98 116  Temp: 97.5 F (36.4 C) 97.6 F (36.4 C) 98.3 F (36.8 C) 97.4 F (36.3 C)  TempSrc: Oral Oral Oral Oral  Resp: 20  20 20 20   Height:      Weight:  87.971 kg (193 lb 15 oz)    SpO2: 95% 93% 96% 96%     Intake/Output Summary (Last 24 hours) at 02/28/15 1322 Last data filed at 02/28/15 1253  Gross per 24 hour  Intake    840 ml  Output   1435 ml  Net   -595 ml   Filed Weights   02/27/15 0556 02/27/15 0854 02/28/15 0431  Weight: 86.183 kg (190 lb) 91.989 kg (202 lb 12.8 oz) 87.971 kg (193 lb 15 oz)     Exam:  General exam: Pleasant elderly male sitting up comfortably in bed  Respiratory system: reduced breath sounds in the bases with few bibasal crackles. Rest of lung fields clear to auscultation. No increased work of breathing. Cardiovascular system: S1 & S2 heard, RRR. No JVD, murmurs, gallops, clicks or pedal edema. Telemetry: Paroxysmal A. fib with controlled ventricular rate. 5 beat NSVT on 11/7 at 12:10 PM.  Gastrointestinal system: Abdomen is nondistended, soft and nontender. Normal bowel sounds heard. Central nervous system: Alert and oriented. No focal neurological deficits. Extremities: Symmetric 5 x 5 power.   Data Reviewed: Basic Metabolic Panel:  Recent Labs Lab 02/27/15 0602 02/28/15 0149  NA 140 140  K 3.9 4.1  CL 106 104  CO2 24 28  GLUCOSE 100* 108*  BUN 14 18  CREATININE 1.54* 1.63*  CALCIUM 9.1 9.5   Liver Function Tests: No results for input(s): AST, ALT, ALKPHOS, BILITOT, PROT, ALBUMIN in the last 168 hours. No results for input(s): LIPASE, AMYLASE in the last 168 hours. No results for input(s): AMMONIA in the last 168 hours. CBC:  Recent Labs Lab 02/27/15 0602  WBC 8.7  NEUTROABS 5.1  HGB 14.3  HCT 44.9  MCV 94.5  PLT 222   Cardiac Enzymes:  Recent Labs Lab 02/27/15 0602 02/27/15 1040 02/27/15 1651  TROPONINI 0.18* 0.17* 0.18*   BNP (last 3 results) No results for input(s): PROBNP in the last 8760 hours. CBG: No results for input(s): GLUCAP in the last 168 hours.  Recent Results (from the past 240 hour(s))  Urine culture     Status: None   Collection Time: 02/27/15  8:14 AM  Result Value Ref Range Status   Specimen  Description URINE, CLEAN CATCH  Final   Special Requests NONE  Final   Culture MULTIPLE SPECIES PRESENT, SUGGEST RECOLLECTION  Final   Report Status 02/28/2015 FINAL  Final  Culture, blood (routine x 2) Call MD if unable to obtain prior to antibiotics being given     Status: None (Preliminary result)   Collection Time: 02/27/15 10:40 AM  Result Value Ref Range Status   Specimen Description BLOOD LEFT ARM  Final   Special Requests BOTTLES DRAWN AEROBIC ONLY 10CC  Final   Culture NO GROWTH 1 DAY  Final   Report Status PENDING  Incomplete  Culture, blood (routine x 2) Call MD if unable to obtain prior to antibiotics being given     Status: None (Preliminary result)   Collection Time: 02/27/15 10:45 AM  Result Value Ref Range Status   Specimen Description BLOOD LEFT HAND  Final   Special Requests BOTTLES DRAWN AEROBIC AND ANAEROBIC 5CC  Final   Culture NO GROWTH 1 DAY  Final   Report Status PENDING  Incomplete         Studies: Nm Pulmonary Perf And Vent  02/27/2015  CLINICAL DATA:  Dyspnea for a few days. History of  CHF. No chest pain. EXAM: NUCLEAR MEDICINE VENTILATION - PERFUSION LUNG SCAN TECHNIQUE: Ventilation images were obtained in multiple projections using inhaled aerosol Tc-38m DTPA. Perfusion images were obtained in multiple projections after intravenous injection of Tc-63m MAA. RADIOPHARMACEUTICALS:  70.6 millicuries of [id number]-76H DTPA aerosol inhalation and 3.3 millicuries of [id number]-94W MAA IV COMPARISON:  Current chest radiograph FINDINGS: Ventilation: No focal ventilation defect. Perfusion: No wedge shaped peripheral perfusion defects to suggest acute pulmonary embolism. IMPRESSION: Unremarkable lung ventilation perfusion study. No evidence of pulmonary thromboembolism. Electronically Signed   By: Lajean Manes M.D.   On: 02/27/2015 12:43   Dg Chest Portable 1 View  02/27/2015  CLINICAL DATA:  Shortness of breath for 2 days EXAM: PORTABLE CHEST 1 VIEW COMPARISON:   06/20/2014 FINDINGS: Mild cardiomegaly with vascular pedicle widening. Stable aortic contours. Borderline pulmonary venous congestion. Chronic hypoventilation with indistinct basilar opacities. No asymmetric consolidation. No pneumothorax. IMPRESSION: Chronic hypoventilation with bibasilar atelectasis or scarring. Superimposed pneumonia could be obscured. Electronically Signed   By: Monte Fantasia M.D.   On: 02/27/2015 06:34        Scheduled Meds: . aspirin  81 mg Oral Daily  . azithromycin  500 mg Oral Q24H  . cefTRIAXone (ROCEPHIN)  IV  1 g Intravenous Q24H  . cholecalciferol  1,000 Units Oral Daily  . furosemide  40 mg Intravenous 3 times per day  . guaiFENesin  1,200 mg Oral BID  . losartan  50 mg Oral Daily  . metoprolol tartrate  25 mg Oral BID  . omega-3 acid ethyl esters  1 g Oral Daily  . potassium chloride  40 mEq Oral BID  . sodium chloride  3 mL Intravenous Q12H  . traMADol  50 mg Oral TID  . vitamin B-12  50 mcg Oral Daily   Continuous Infusions: . heparin 1,200 Units/hr (02/28/15 1248)    Principal Problem:   Acute systolic CHF (congestive heart failure), NYHA class 4 (HCC) Active Problems:   Hyperlipemia   Essential hypertension, benign   PROSTATE CANCER, HX OF   CAP (community acquired pneumonia)   Dysuria   Elevated troponin   CKD (chronic kidney disease), stage III   Tobacco abuse   Acute heart failure ([place])    Time spent: 45 minutes.    Vernell Leep, MD, FACP, FHM. [facility] Pager [phone]  If 7PM-7AM, please contact night-coverage [email or web address] Password [email or web address] 02/28/2015, 1:22 PM    LOS: 1 day

## 2015-02-28 NOTE — Evaluation (Signed)
Occupational Therapy Evaluation Patient Details Name: Kyle [name] MRN: [id number] DOB: [birth year] Today's Date: 02/28/2015    History of Present Illness 81 y.o. male with chronic kidney disease, hypertension, hyperlipidemia and pneumonia 1/16. Admitted with dyspnea and generalized weakness.   Clinical Impression   Pt admitted with above. Pt independent with ADLs, PTA. Feel pt will benefit from acute OT to increase independence and strength prior to d/c. Recommending HHOT.    Follow Up Recommendations  Home health OT;Supervision - Intermittent    Equipment Recommendations   3 in 1 versus shower seat-family to think about it   Recommendations for Other Services       Precautions / Restrictions Precautions Precautions: Fall Restrictions Weight Bearing Restrictions: No      Mobility Bed Mobility Overal bed mobility: Needs Assistance Bed Mobility: Sit to Supine;Supine to Sit     Supine to sit: Supervision Sit to supine: Supervision      Transfers Overall transfer level: Needs assistance   Transfers: Sit to/from Stand Sit to Stand: Min guard              Balance    History of fall. No LOB in session.                                        ADL Overall ADL's : Needs assistance/impaired     Grooming: Wash/dry hands;Standing;Set up;Supervision/safety               Lower Body Dressing: Min guard;Sit to/from stand   Toilet Transfer: Min guard;Ambulation;Regular Toilet;Grab bars (cane)   Toileting- Clothing Manipulation and Hygiene: Min guard;Sit to/from stand       Functional mobility during ADLs: Min guard;Cane General ADL Comments: Educated on energy conservation techniques. Discussed options for shower chair.     Vision     Perception     Praxis      Pertinent Vitals/Pain Pain Assessment: No/denies pain     Hand Dominance Right   Extremity/Trunk Assessment Upper Extremity Assessment Upper Extremity Assessment:  Generalized weakness   Lower Extremity Assessment Lower Extremity Assessment: Defer to PT evaluation       Communication Communication Communication: No difficulties   Cognition Arousal/Alertness: Awake/alert Behavior During Therapy: Flat affect;WFL for tasks assessed/performed Overall Cognitive Status: Within Functional Limits for tasks assessed                     General Comments       Exercises       Shoulder Instructions      Home Living Family/patient expects to be discharged to:: Private residence Living Arrangements: Spouse/significant other Available Help at Discharge: Family;Available 24 hours/day Type of Home: House Home Access: Stairs to enter [employer] of Steps: 4, 3, and 1 step on different entrances Entrance Stairs-Rails: Right;Left;Can reach both (on the 3 steps) Home Layout: One level     Bathroom Shower/Tub: Tub/shower unit;Walk-in shower         Home Equipment: Gilford Rile - 2 wheels;Cane - single point          Prior Functioning/Environment Level of Independence: Independent with assistive device(s)             OT Diagnosis: Generalized weakness   OT Problem List: Decreased activity tolerance;Decreased strength;Decreased knowledge of use of DME or AE;Decreased knowledge of precautions   OT Treatment/Interventions: Self-care/ADL training;Therapeutic exercise;Energy conservation;DME and/or AE instruction;Patient/family  education;Balance training;Therapeutic activities    OT Goals(Current goals can be found in the care plan section) Acute Rehab OT Goals Patient Stated Goal: wife states to go home OT Goal Formulation: With patient Time For Goal Achievement: 03/07/15 Potential to Achieve Goals: Good ADL Goals Pt Will Perform Lower Body Bathing: with supervision;sit to/from stand (including gathering items) Pt Will Perform Lower Body Dressing: with supervision;sit to/from stand (including gathering items) Pt Will Transfer  to Toilet: ambulating;with modified independence (cane) Additional ADL Goal #1: Pt will independently verbalize 3/3 energy conservation techniques and utilize as needed in session.  OT Frequency: Min 2X/week   Barriers to D/C:            Co-evaluation              End of Session Equipment Utilized During Treatment: Gait belt;Other (comment) (cane)  Activity Tolerance: Patient tolerated treatment well Patient left: in bed;with bed alarm set;with call bell/phone within reach;with family/visitor present   Time: 2841-3244 OT Time Calculation (min): 19 min Charges:  OT General Charges $OT Visit: 1 Procedure OT Evaluation $Initial OT Evaluation Tier I: 1 Procedure G-CodesBenito Mccreedy OTR/L [id number] 02/28/2015, 10:39 AM

## 2015-02-28 NOTE — Progress Notes (Signed)
ANTICOAGULATION CONSULT NOTE - Initial Consult  Pharmacy Consult for Heparin Indication: atrial fibrillation  Allergies  Allergen Reactions  . Simvastatin     REACTION: myalgias    Patient Measurements: Height: 6' (182.9 cm) Weight: 193 lb 15 oz (87.971 kg) (scale C) IBW/kg (Calculated) : 77.6 Heparin Dosing Weight:  88 kg  Vital Signs: Temp: 98.3 F (36.8 C) (11/08 0743) Temp Source: Oral (11/08 0743) BP: 120/87 mmHg (11/08 0743) Pulse Rate: 98 (11/08 0743)  Labs:  Recent Labs  02/27/15 0602 02/27/15 1040 02/27/15 1651 02/28/15 0149  HGB 14.3  --   --   --   HCT 44.9  --   --   --   PLT 222  --   --   --   CREATININE 1.54*  --   --  1.63*  TROPONINI 0.18* 0.17* 0.18*  --     Estimated Creatinine Clearance: 39 mL/min (by C-G formula based on Cr of 1.63).   Medical History: Past Medical History  Diagnosis Date  . History of prostate cancer   . Hyperlipidemia   . Hypertension   . Arthritis   . Impaired fasting glucose   . Detached retina     right  . H/O dizziness   . Urgency of urination   . Cancer [facility])     prostate CA  . GERD (gastroesophageal reflux disease)   . Pneumonia   . CAP (community acquired pneumonia) 05/10/2014  . Stroke [facility]) ~ 1995-02/2009 X 2    /notes 08/22/2010    Medications:  Prescriptions prior to admission  Medication Sig Dispense Refill Last Dose  . aspirin 81 MG tablet Take 81 mg by mouth daily.     02/26/2015 at Unknown time  . Cholecalciferol (VITAMIN D) 1000 UNITS capsule Take 1,000 Units by mouth daily.     02/26/2015 at Unknown time  . fish oil-omega-3 fatty acids 1000 MG capsule Take 1 g by mouth daily.   02/26/2015 at Unknown time  . losartan-hydrochlorothiazide (HYZAAR) 50-12.5 MG per tablet Take 1 tablet by mouth daily.   02/26/2015 at Unknown time  . Red Yeast Rice 600 MG CAPS Take 1 capsule by mouth daily.    02/26/2015 at Unknown time  . traMADol (ULTRAM) 50 MG tablet TAKE 1 TABLET BY MOUTH 3 TIMES A DAY 90 tablet 0  02/26/2015 at Unknown time  . vitamin B-12 (CYANOCOBALAMIN) 50 MCG tablet Take 50 mcg by mouth daily.   02/26/2015 at Unknown time  . losartan (COZAAR) 25 MG tablet TAKE 1 TABLET BY MOUTH EVERY DAY (Patient not taking: Reported on 02/27/2015) 90 tablet 3 Not Taking at Unknown time    Assessment: Worsening SOB, nonproductive cough, subjective fevers at home, urinary hesitancy/pain.  81 y/o F presents with above complaints and elevated troponins and BNP with no h/o CHF. Admit for CHF vs PNA. R/o DVT/PE and obtain Echo.  Anticoagulation: Dopplers negative for DVT. Now in afib. CHADSVASC score=7 (counting DM, 11.2%/year). Start IV heparin.  ID: r/o PNA. Azithro/Rocphine x 7 days.  Cardiovascular: BNP 1011. LVEF severely depressed at 15 - 20%. Troponins 0.18, 0.17, 0.18. Will need cath.  Endocrinology: IFG. Currently 108, TSH WNL  Gastrointestinal / Nutrition: GERD  Neurology: h/o CVA  Nephrology: CKD with Scr 1.63. K=4.1. Watch on Kdur 40 BID.  Pulmonary: Delsym, Mucinex  Hematology / Oncology: h/o prostate cancer. CBC WNL  PTA Medication Issues: Fish oil,   Best Practices   Goal of Therapy:  Heparin level 0.3-0.7 units/ml Monitor platelets  by anticoagulation protocol: Yes   Plan:  Needs cardiac cath. D/c SQ heparin (laste received 0600 on 11/8) Heparin 2000 units bolus (1/2 bolus due to SQ heparin received this AM) Heparin infusion 1200 units/hr Check heparin level in 6-8 hrs. Daily Heparin level and CBC   Dontea Corlew S. Alford Highland, PharmD, BCPS Clinical Staff Pharmacist Pager [phone]  Eilene Ghazi Stillinger 02/28/2015,9:57 AM

## 2015-02-28 NOTE — Progress Notes (Signed)
Subjective: Feels a little better.  Still with orthopnea   Objective: Vital signs in last 24 hours: Temp:  [97.5 F (36.4 C)-98.3 F (36.8 C)] 98.3 F (36.8 C) (11/08 0743) Pulse Rate:  [85-109] 98 (11/08 0743) Resp:  [20] 20 (11/08 0743) BP: (120-129)/(82-97) 120/87 mmHg (11/08 0743) SpO2:  [93 %-96 %] 96 % (11/08 0743) Weight:  [193 lb 15 oz (87.971 kg)] 193 lb 15 oz (87.971 kg) (11/08 0431) Last BM Date: 02/27/15  Intake/Output from previous day: 11/07 0701 - 11/08 0700 In: 960 [P.O.:960] Out: 1760 [Urine:1760] Intake/Output this shift: Total I/O In: 240 [P.O.:240] Out: -   Medications Scheduled Meds: . aspirin  81 mg Oral Daily  . azithromycin  500 mg Oral Q24H  . cefTRIAXone (ROCEPHIN)  IV  1 g Intravenous Q24H  . cholecalciferol  1,000 Units Oral Daily  . furosemide  40 mg Intravenous BID  . guaiFENesin  1,200 mg Oral BID  . heparin  5,000 Units Subcutaneous 3 times per day  . losartan  50 mg Oral Daily   And  . hydrochlorothiazide  12.5 mg Oral Daily  . metoprolol tartrate  25 mg Oral BID  . omega-3 acid ethyl esters  1 g Oral Daily  . potassium chloride  40 mEq Oral BID  . sodium chloride  3 mL Intravenous Q12H  . traMADol  50 mg Oral TID  . vitamin B-12  50 mcg Oral Daily   Continuous Infusions:  PRN Meds:.sodium chloride, acetaminophen, dextromethorphan, diphenhydrAMINE, nicotine, ondansetron (ZOFRAN) IV, sodium chloride, technetium TC 26M diethylenetriame-pentaacetic acid  PE: General appearance: alert, cooperative and no distress, con not lay flt without getting very SOB Neck: +LVD Lungs: clear to auscultation bilaterally Heart: irregularly irregular rhythm and No MM Abdomen: +BS Nontender Extremities: No LEE Pulses: 2+ and symmetric Skin: Warm and dry Neurologic: Grossly normal  Lab Results:   Recent Labs  02/27/15 0602  WBC 8.7  HGB 14.3  HCT 44.9  PLT 222   BMET  Recent Labs  02/27/15 0602 02/28/15 0149  NA 140 140  K 3.9  4.1  CL 106 104  CO2 24 28  GLUCOSE 100* 108*  BUN 14 18  CREATININE 1.54* 1.63*  CALCIUM 9.1 9.5    Studies/Results: Echocardiogram Study Conclusions  - Left ventricle: The cavity size was normal. Wall thickness was increased in a pattern of mild LVH.  EF is 15-20%.   - Mitral valve: There was mild regurgitation. - Right ventricle: Systolic function was mildly reduced. - Tricuspid valve: There was moderate regurgitation.  Lipid Panel     Component Value Date/Time   CHOL 222* 04/20/2014 0931   TRIG 138.0 04/20/2014 0931   TRIG 119 05/01/2006 1043   HDL 49.50 04/20/2014 0931   CHOLHDL 4 04/20/2014 0931   CHOLHDL 3.2 CALC 05/01/2006 1043   VLDL 27.6 04/20/2014 0931   LDLCALC 145* 04/20/2014 0931   LDLDIRECT 164.4 10/10/2010 1012      Assessment/Plan  Principal Problem:   Acute CHF (congestive heart failure) ([place]) He noticed a change in his health about a month ago.  He started having some exertional chest pressure which was more obvious two weeks ago.  There was radiation to his right shoulder.  He had some discomfort up until last night.     Net fluids: -0.8L.  Severely depressed EF 15-20%.  SCr 1.63.  Continue Lasix 40mg  IV BID.  May ned to increase dose.  HCTZ 12.5(stop now), cozaar 50, lopressor 25 BID.  Considering changing afterload reduction to hydralazine or IV nitro to preserve kidney function.     Left heart cath is needed, however, there is risk of worsening kidney disease.  I think the potential benefit is more important.  Need to further discuss with patient and family.  He currently can not lay flat    Elevated troponin Flat trend 0.18, 0.17, 0.18.  Probably related to CHF, however, see above    Afib RVR New Afib.  Somewhat controlled. CHADSVASC 5.   Will add IV heparin for now but will need long term anticoagulation.  With severe depressed EF, we need to think about adding digoxin instead for increasing metoprolol.   He was in SR yesterday, so  amiodarone may be an option as well. Plus VT protection.     Hyperlipemia   LDL 03/2014 was 145.  Recheck in AM.    Essential hypertension, benign Controlled.     PROSTATE CANCER, HX OF   CAP (community acquired pneumonia)  ABX   Dysuria   CKD (chronic kidney disease), stage III  Stable SCr.  Worsening kidney function will likely be from cardiorenal syndrome.     Tobacco abuse   LOS: 1 day    HAGER, BRYAN PA-C 02/28/2015 9:39 AM  Pt. Seen and examined. Agree with the NP/PA-C note as written. Breathing has improved somewhat -  Urine output looks negative, however, weight increasing? Says he is urinating frequently, may not have collected it all. Denies chest pain. Echo shows new cardiomyopathy - EF 15-20% with global hypokinesis. Will need additional diuresis. Increase to 40 mg TID - follow-up on UOP. ?need for milrinone if this is in fact cardiorenal syndrome. Ultimately, will need left and right heart catheterization. Not able to lay flat for it at this point - suspect probably Thursday.  Pixie Casino, MD, [facility] Attending Cardiologist [place]

## 2015-02-28 NOTE — Progress Notes (Signed)
Pt HR up in 140's and sustaining for a couple minutes before going back down to the 120's.  Pt asymptomatic and is sitting on the edge of the bed.  MD notified and new orders will be placed.  Will continue to monitor.

## 2015-02-28 NOTE — Clinical Documentation Improvement (Signed)
Hospitalist  Can the diagnosis of Acute CHF be further specified?    Acuity - Acute, Chronic, Acute on Chronic   Type - Systolic, Diastolic, Systolic and Diastolic  Other  Clinically Undetermined   Document any associated diagnoses/conditions  Please update your documentation within the medical record to reflect your response to this query. Thank you.  Supporting Information:(As per notes) "Acute congestive heart failure"   BNP on admission was elevated to 1011  Please exercise your independent, professional judgment when responding. A specific answer is not anticipated or expected.  Thank You, Alessandra Grout, RN, BSN, CCDS,Clinical Documentation Specialist:  [phone]  [phone]=Cell [place]- Health Information Management

## 2015-02-28 NOTE — Progress Notes (Signed)
Pt chewing tobacco in room.  Pt educated about being a non tobacco facility.  Pt stated tobacco was sent home with his wife and RN threw away his cup with chewing tobacco in it.  Will continue to monitor.

## 2015-02-28 NOTE — Progress Notes (Signed)
A change of pt's cardiac rthythm from NSR to Afib , 12L EKG done, asymptomatic, denies chest pain, denies nausea and vomiting,not in respiratory distress. K. Schorr was notified   02/28/15 0431  Vitals  Temp 97.6 F (36.4 C)  Temp Source Oral  BP 128/86 mmHg  BP Location Right Arm  BP Method Automatic  Patient Position (if appropriate) Lying  Pulse Rate Source Dinamap  Resp 20  Oxygen Therapy  SpO2 93 %  O2 Device Room Air  Height and Weight  Weight 87.971 kg (193 lb 15 oz) (scale C)  Type of Scale Used Standing  Type of Weight Actual

## 2015-02-28 NOTE — Progress Notes (Signed)
PT Cancellation Note  Patient Details Name: Kyle [name] MRN: [id number] DOB: Jan 15, 1932   Cancelled Treatment:    Reason Eval/Treat Not Completed: Patient not medically ready. RN deferred at this time due to HR at 114 at rest and increased to 140s when assisted to bathroom. PT to return as able when appropriate.   [name] 02/28/2015, 2:12 PM  Kittie Plater, PT, DPT Pager #: [phone] Office #: [phone]

## 2015-02-28 NOTE — Progress Notes (Signed)
Heart Failure Navigator Consult Note  Presentation: Kyle [name] is a 81 y.o. male with chronic kidney disease, hypertension, hyperlipidemia and pneumonia 1/16. He complains of increased dyspnea over the past three days. He has one pillow orthopnea but has been not been able to sleep due to shortness of breath associated with subjective fever, chills and non productive cough. The patient also endorses new dyspnea on exertion and increased generalized weakness. He also complains on pain with urination and suprapubic pain. He denies chest pain, nausea, vomiting, anorexia, diarrhea, and constipation. The patient is a former smoker x 20 years and currently uses oral tobacco 1bag/3 days. On exam the patient has mild right lower extremity edema, suprapubic tenderness to palpation and BP is 175/107.  Past Medical History  Diagnosis Date  . History of prostate cancer   . Hyperlipidemia   . Hypertension   . Arthritis   . Impaired fasting glucose   . Detached retina     right  . H/O dizziness   . Urgency of urination   . Cancer [facility])     prostate CA  . GERD (gastroesophageal reflux disease)   . Pneumonia   . CAP (community acquired pneumonia) 05/10/2014  . Stroke [facility]) ~ 1995-02/2009 X 2    /notes 08/22/2010    Social History   Social History  . Marital Status: Married    Spouse Name: N/A  . Number of Children: 1  . Years of Education: N/A   Occupational History  . retired    Social History Main Topics  . Smoking status: Never Smoker   . Smokeless tobacco: Former Systems developer    Types: Chew    Quit date: 04/22/2004  . Alcohol Use: No  . Drug Use: No  . Sexual Activity: Not Asked   Other Topics Concern  . None   Social History Narrative   Has living will   Wife is health care POA   Would accept CPR but no prolonged machines.    Would not want a feeding tube if cognitively unaware             ECHO:Study Conclusions--02/27/15  - Left ventricle: The cavity size was normal. Wall  thickness was increased in a pattern of mild LVH. - Mitral valve: There was mild regurgitation. - Right ventricle: Systolic function was mildly reduced. - Tricuspid valve: There was moderate regurgitation.  Transthoracic echocardiography. M-mode, complete 2D, spectral Doppler, and color Doppler. Birthdate: Patient birthdate: 1931/08/08. Age: Patient is 81 yr old. Sex: Gender: male. BMI: 27.4 kg/m^2. Blood pressure:   174/93 Patient status: Inpatient. Study date: Study date: 02/27/2015. Study time: 02:57 PM. Location: Echo laboratory.  -------------------------------------------------------------------  ------------------------------------------------------------------- Left ventricle: LVEF is severely depressed at 15 to 20%. The cavity size was normal. Wall thickness was increased in a pattern of mild LVH. BNP    Component Value Date/Time   BNP 1011.3* 02/27/2015 0603    ProBNP No results found for: PROBNP   Education Assessment and Provision:  Detailed education and instructions provided on heart failure disease management including the following:  Signs and symptoms of Heart Failure When to call the physician Importance of daily weights Low sodium diet Fluid restriction Medication management Anticipated future follow-up appointments  Patient education given on each of the above topics.  Patient acknowledges understanding and acceptance of all instructions.  I spoke with patient and wife regarding his new HF diagnosis.  They both tell me that this is all new and he has never been told  that he had HF or "heart issues" before.  He does not currently weigh daily and I have encouraged them to begin daily weights.  I reviewed how weight increases relate to signs ans symptom of HF.  He admits that he eats at [employer] most mornings with his buddies--and often gets a sausage gravy biscuit or chicken biscuit.  I spent time discussing a low sodium diet and high  sodium foods to avoid.  His wife admits that she is quite unfamiliar with monitoring sodium in her cooking.  She has written handouts and will review.  They deny any issues getting or taking prescribed medications.  They live together in [place] and he will follow outpatient at [facility].  I will plan to return to reinforce education.  Education Materials:  "Living Better With Heart Failure" Booklet, Daily Weight Tracker Tool    High Risk Criteria for Readmission and/or Poor Patient Outcomes:   EF <30%- Yes 15-20% newly reduced  2 or more admissions in 6 months- No  Difficult social situation- No  Demonstrates medication noncompliance- No   Barriers of Care:   New HF-- Knowledge and compliance  Discharge Planning:   Plans to return to home with wife to [place].  He will benefit from [facility] for ongoing education, compliance reinforcement  And symptom recognition.  I have consulted [facility] for possible outpatient care management services as well.

## 2015-03-01 ENCOUNTER — Inpatient Hospital Stay (HOSPITAL_COMMUNITY): Payer: Medicare Other

## 2015-03-01 DIAGNOSIS — I480 Paroxysmal atrial fibrillation: Secondary | ICD-10-CM

## 2015-03-01 DIAGNOSIS — J189 Pneumonia, unspecified organism: Secondary | ICD-10-CM

## 2015-03-01 DIAGNOSIS — R7989 Other specified abnormal findings of blood chemistry: Secondary | ICD-10-CM

## 2015-03-01 DIAGNOSIS — I10 Essential (primary) hypertension: Secondary | ICD-10-CM

## 2015-03-01 DIAGNOSIS — I5021 Acute systolic (congestive) heart failure: Secondary | ICD-10-CM | POA: Insufficient documentation

## 2015-03-01 DIAGNOSIS — N183 Chronic kidney disease, stage 3 (moderate): Secondary | ICD-10-CM

## 2015-03-01 LAB — LIPID PANEL
Cholesterol: 194 mg/dL (ref 0–200)
HDL: 53 mg/dL (ref >40)
LDL Cholesterol: 128 mg/dL — ABNORMAL HIGH (ref 0–99)
Total CHOL/HDL Ratio: 3.7 RATIO
Triglycerides: 63 mg/dL (ref <150)
VLDL: 13 mg/dL (ref 0–40)

## 2015-03-01 LAB — BASIC METABOLIC PANEL
Anion gap: 12 (ref 5–15)
BUN: 27 mg/dL — ABNORMAL HIGH (ref 6–20)
CO2: 25 mmol/L (ref 22–32)
Calcium: 9.9 mg/dL (ref 8.9–10.3)
Chloride: 102 mmol/L (ref 101–111)
Creatinine, Ser: 1.71 mg/dL — ABNORMAL HIGH (ref 0.61–1.24)
GFR calc Af Amer: 41 mL/min — ABNORMAL LOW (ref >60)
GFR calc non Af Amer: 36 mL/min — ABNORMAL LOW (ref >60)
Glucose, Bld: 121 mg/dL — ABNORMAL HIGH (ref 65–99)
Potassium: 4.3 mmol/L (ref 3.5–5.1)
Sodium: 139 mmol/L (ref 135–145)

## 2015-03-01 LAB — CBC
HCT: 47.4 % (ref 39.0–52.0)
Hemoglobin: 15.8 g/dL (ref 13.0–17.0)
MCH: 31.2 pg (ref 26.0–34.0)
MCHC: 33.3 g/dL (ref 30.0–36.0)
MCV: 93.5 fL (ref 78.0–100.0)
Platelets: 228 10*3/uL (ref 150–400)
RBC: 5.07 MIL/uL (ref 4.22–5.81)
RDW: 15 % (ref 11.5–15.5)
WBC: 9 10*3/uL (ref 4.0–10.5)

## 2015-03-01 LAB — LEGIONELLA PNEUMOPHILA SEROGP 1 UR AG: L. pneumophila Serogp 1 Ur Ag: NEGATIVE

## 2015-03-01 LAB — GLUCOSE, CAPILLARY
Glucose-Capillary: 109 mg/dL — ABNORMAL HIGH (ref 65–99)
Glucose-Capillary: 107 mg/dL — ABNORMAL HIGH (ref 65–99)

## 2015-03-01 LAB — HEPARIN LEVEL (UNFRACTIONATED)
Heparin Unfractionated: 0.75 IU/mL — ABNORMAL HIGH (ref 0.30–0.70)
Heparin Unfractionated: 0.5 IU/mL (ref 0.30–0.70)

## 2015-03-01 MED ORDER — NITROGLYCERIN IN D5W 200-5 MCG/ML-% IV SOLN
5.0000 ug/min | INTRAVENOUS | Status: DC
  Administered 2015-03-01: 5 ug/min via INTRAVENOUS
  Filled 2015-03-01: qty 250

## 2015-03-01 MED ORDER — SODIUM CHLORIDE 0.9 % IV SOLN: 250.0000 mL | INTRAVENOUS | Status: DC | PRN

## 2015-03-01 MED ORDER — SODIUM CHLORIDE 0.9 % IJ SOLN: 3.0000 mL | Freq: Two times a day (BID) | INTRAMUSCULAR | Status: DC

## 2015-03-01 MED ORDER — ASPIRIN 81 MG PO CHEW
81.0000 mg | CHEWABLE_TABLET | ORAL | Status: AC
  Administered 2015-03-02: 81 mg via ORAL
  Filled 2015-03-01: qty 1

## 2015-03-01 MED ORDER — SODIUM CHLORIDE 0.9 % IJ SOLN: 3.0000 mL | INTRAMUSCULAR | Status: DC | PRN

## 2015-03-01 MED ORDER — SODIUM CHLORIDE 0.9 % IV SOLN
INTRAVENOUS | Status: DC
  Administered 2015-03-02: 06:00:00 via INTRAVENOUS

## 2015-03-01 NOTE — Progress Notes (Signed)
ANTICOAGULATION CONSULT NOTE - Follow Up Consult  Pharmacy Consult for Heparin  Indication: atrial fibrillation  Allergies  Allergen Reactions  . Simvastatin     REACTION: myalgias    Patient Measurements: Height: 6' (182.9 cm) Weight: 193 lb 15 oz (87.971 kg) (scale C) IBW/kg (Calculated) : 77.6  Vital Signs: Temp: 97.6 F (36.4 C) (11/08 2128) Temp Source: Oral (11/08 2128) BP: 123/91 mmHg (11/08 2128) Pulse Rate: 80 (11/08 2128)  Labs:  Recent Labs  02/27/15 0602 02/27/15 1040 02/27/15 1651 02/28/15 0149 02/28/15 1924 03/01/15 0327  HGB 14.3  --   --   --   --  15.8  HCT 44.9  --   --   --   --  47.4  PLT 222  --   --   --   --  228  HEPARINUNFRC  --   --   --   --  0.58 0.75*  CREATININE 1.54*  --   --  1.63*  --   --   TROPONINI 0.18* 0.17* 0.18*  --   --   --     Estimated Creatinine Clearance: 39 mL/min (by C-G formula based on Cr of 1.63).    Assessment: Supra-therapeutic heparin level, no issus per RN.   Goal of Therapy:  Heparin level 0.3-0.7 units/ml Monitor platelets by anticoagulation protocol: Yes   Plan:  -Decrease heparin level to 1050 units/hr -1230 HL  Narda Bonds 03/01/2015,4:21 AM

## 2015-03-01 NOTE — Progress Notes (Signed)
Pt refuses to keep bed alarm on. States that he feels as if we have him in jail. Does not want the bed alarm on.

## 2015-03-01 NOTE — Evaluation (Signed)
Physical Therapy Evaluation Patient Details Name: Kyle [name] MRN: [id number] DOB: 04/10/1932 Today's Date: 03/01/2015   History of Present Illness  81 y.o. male with chronic kidney disease, hypertension, hyperlipidemia and pneumonia 1/16. Admitted with dyspnea and generalized weakness.  Clinical Impression  Pt admitted with above diagnosis. Pt currently with functional limitations due to the deficits listed below (see PT Problem List).  Pt will benefit from skilled PT to increase their independence and safety with mobility to allow discharge to the venue listed below.  Recommend HHPT at time of discharge.     Follow Up Recommendations Home health PT    Equipment Recommendations  None recommended by PT    Recommendations for Other Services       Precautions / Restrictions Precautions Precautions: Fall Restrictions Weight Bearing Restrictions: No      Mobility  Bed Mobility   Bed Mobility: Supine to Sit     Supine to sit: Supervision     General bed mobility comments: HOB elevated with transfer to sitting  Transfers Overall transfer level: Needs assistance Equipment used: None Transfers: Sit to/from Stand Sit to Stand: Min guard         General transfer comment: min/guard for steadying due to pt states he gets dizzy sometimes when standing  Ambulation/Gait Ambulation/Gait assistance: Min guard Ambulation Distance (Feet): 88 Feet Assistive device:  (pushing IV pole) Gait Pattern/deviations: Drifts right/left;Decreased step length - right;Decreased step length - left     General Gait Details: decreased speed with drifting   Stairs            Wheelchair Mobility    Modified Rankin (Stroke Patients Only)       Balance Overall balance assessment: Needs assistance   Sitting balance-Leahy Scale: Good       Standing balance-Leahy Scale: Fair Standing balance comment: fair static and fair(-) dynamic                              Pertinent Vitals/Pain Pain Assessment: No/denies pain    Home Living Family/patient expects to be discharged to:: Private residence Living Arrangements: Spouse/significant other Available Help at Discharge: Family;Available 24 hours/day Type of Home: House Home Access: Stairs to enter Entrance Stairs-Rails: Right;Left;Can reach both (on the 3 steps) Entrance Stairs-Number of Steps: 4, 3, and 1 step on different entrances Home Layout: One level Home Equipment: Walker - 2 wheels;Cane - single point      Prior Function Level of Independence: Independent with assistive device(s)               Hand Dominance   Dominant Hand: Right    Extremity/Trunk Assessment   Upper Extremity Assessment: Defer to OT evaluation           Lower Extremity Assessment: Generalized weakness;Overall WFL for tasks assessed         Communication   Communication: No difficulties  Cognition Arousal/Alertness: Awake/alert Behavior During Therapy: WFL for tasks assessed/performed;Flat affect Overall Cognitive Status: Within Functional Limits for tasks assessed                      General Comments General comments (skin integrity, edema, etc.): Pt reports he has been doing therex with HHPT, instructed to do those while in hospital.    Exercises General Exercises - Lower Extremity Ankle Circles/Pumps: Both;10 reps;Seated      Assessment/Plan    PT Assessment Patient needs continued PT services  PT Diagnosis Difficulty walking   PT Problem List Decreased strength;Decreased balance;Decreased mobility;Decreased activity tolerance  PT Treatment Interventions Gait training;Functional mobility training;Therapeutic activities;Therapeutic exercise;Stair training   PT Goals (Current goals can be found in the Care Plan section) Acute Rehab PT Goals Patient Stated Goal: to go home PT Goal Formulation: With patient Time For Goal Achievement: 03/08/15 Potential to Achieve Goals:  Good    Frequency Min 3X/week   Barriers to discharge        Co-evaluation               End of Session Equipment Utilized During Treatment: Gait belt Activity Tolerance: Patient tolerated treatment well Patient left: in chair;with call bell/phone within reach;with family/visitor present Nurse Communication: Mobility status         Time: 1046-1106 PT Time Calculation (min) (ACUTE ONLY): 20 min   Charges:   PT Evaluation $Initial PT Evaluation Tier I: 1 Procedure     PT G Codes:        Danis Pembleton LUBECK 03/01/2015, 11:34 AM

## 2015-03-01 NOTE — Care Management Note (Signed)
Case Management Note  Patient Details  Name: Kyle [name] MRN: [id number] Date of Birth: [birth year]  Subjective/Objective:     Admitted with Heart Failure               Action/Plan: Patient lives at home with spouse, use a cane for mobility. PCP: Viviana Simpler, MD; Has private insurance with Medicare and [place] with prescription drug coverage. [facility]; no problems getting medication. He has scales at home. CM informed patient of the importance of weighing himself daily. Patient could benefit from a Disease Management Program for CHF; [place] choice offered. Patient chose [facility]. Butch Penny with [facility] called for arrangements. Attending MD at discharge please enter the face to face document for [facility] services in Epic per Medicare guidelines.   Expected Discharge Date:   possibly 03/05/2015               Expected Discharge Plan:  [place]  Discharge planning Services  CM Consult  Choice offered to:  Patient  HH Arranged:  RN, Disease Management, PT [place] Agency:  [place]  Status of Service:  In process, will continue to follow  Sherrilyn Rist [phone] 03/01/2015, 10:29 AM

## 2015-03-01 NOTE — Progress Notes (Signed)
[facility] PROGRESS NOTE    Progress Note   Kyle [name] [id number] DOB: 25-Nov-1931 DOA: 02/27/2015 PCP: Viviana Simpler, MD   Brief Narrative:   Kyle [name] is an 81 y.o. male with past medical history of chronic renal disease prostate cancer and stroke that comes in for progressing worsening dyspnea, orthopnea and nonproductive cough.  Assessment/Plan:   Acute systolic heart failure/newly diagnosed cardiomyopathy: 2-D echo was done that showed an ejection fraction less than 30%, cardiology was consulted and he was started on IV Lasix,  Sluggish urine output. She tomorrow increasing creatinine. DOB was held, cardiology recommended cardiac cath. She remains afebrile with no leukocytosis, patient is a poor historian we'll continue empiric antibiotic coverage. Cannot rule out pneumonia as the patient is a poor historian.  New onset atrial fibrillation: - CHADSVASC 5 - Continue metoprolol. Cardiology started IV heparin but will need long-term anticoagulation  Elevated troponins: The setting of A. fib with RVR, she also relates a history of severe chest pain about a week ago.  Essential hypertension: Continue metoprolol certainly was held to 2 new mild increasing creatinine.  Stage III chronic kidney disease: Creatinine1.3-0.5,there has been mild increased since she was started on IV Lasix -renal ultrasound was performed and show no hydronephrosis.     DVT Prophylaxis - Lovenox ordered.  Family Communication: none Disposition Plan: Home when stable. Code Status:     Code Status Orders        Start     Ordered   02/27/15 0901  Full code   Continuous     02/27/15 0900        IV Access:    Peripheral IV   Procedures and diagnostic studies:   Dg Chest 2 View  03/01/2015  CLINICAL DATA:  Heart failure subsequent encounter, scheduled for heart catheterization tomorrow EXAM: CHEST  2 VIEW COMPARISON:  02/27/2015 FINDINGS: Limited inspiratory effect  with mild elevation of the right diaphragm. Moderate cardiac enlargement with vascular congestion similar to prior study. No evidence of edema consolidation or effusion. IMPRESSION: Chronic hypoventilatory change. Cardiac enlargement. No acute findings. Electronically Signed   By: Skipper Cliche M.D.   On: 03/01/2015 11:53   US Renal  02/28/2015  CLINICAL DATA:  Chronic kidney disease. EXAM: RENAL / URINARY TRACT ULTRASOUND COMPLETE COMPARISON:  CT 03/11/2011 FINDINGS: Right Kidney: Length: 10.7 cm. Echogenicity within normal limits. No mass or hydronephrosis visualized. Left Kidney: Length: 10.4 cm. Echogenicity within normal limits. No mass or hydronephrosis visualized. Bladder: Appears normal for degree of bladder distention. IMPRESSION: No acute findings. Electronically Signed   By: Rolm Baptise M.D.   On: 02/28/2015 15:40    2 D Echo 02/27/2015: Study Conclusions  - Left ventricle: The cavity size was normal. Wall thickness was increased in a pattern of mild LVH. - Mitral valve: There was mild regurgitation. - Right ventricle: Systolic function was mildly reduced. - Tricuspid valve: There was moderate regurgitation. - Left ventricle: LVEF is severely depressed at 15 to 20%. The cavity size was normal. Wall thickness was increased in a pattern of mild LVH.  Bilateral lower extremity venous duplex completed.  Preliminary report: There is no DVT or SVT noted in the bilateral lower extremities.   Procedures:  None  Antibiotics:  Azithromycin 11/7 >  IV Rocephin 11/7 >  Medical Consultants:    None.  Anti-Infectives:   Anti-infectives    Start     Dose/Rate Route Frequency Ordered Stop   02/27/15 1000  cefTRIAXone (ROCEPHIN) 1  g in dextrose 5 % 50 mL IVPB     1 g 100 mL/hr over 30 Minutes Intravenous Every 24 hours 02/27/15 0900 03/06/15 0959   02/27/15 1000  azithromycin (ZITHROMAX) tablet 500 mg     500 mg Oral Every 24 hours 02/27/15 0900 03/06/15 6440       Subjective:    Kyle [name] no new complaints poor historian.  Objective:    Filed Vitals:   02/28/15 1845 02/28/15 2128 03/01/15 0515 03/01/15 1157  BP: 143/108 123/91 111/84 115/78  Pulse: 115 80 77 72  Temp:  97.6 F (36.4 C) 97.6 F (36.4 C) 97.7 F (36.5 C)  TempSrc:  Oral Oral Oral  Resp:  20 20 18   Height:      Weight:   86.456 kg (190 lb 9.6 oz)   SpO2:  95% 100% 94%    Intake/Output Summary (Last 24 hours) at 03/01/15 1325 Last data filed at 03/01/15 0600  Gross per 24 hour  Intake 1163.98 ml  Output   1300 ml  Net -136.02 ml   Filed Weights   02/27/15 0854 02/28/15 0431 03/01/15 0515  Weight: 91.989 kg (202 lb 12.8 oz) 87.971 kg (193 lb 15 oz) 86.456 kg (190 lb 9.6 oz)    Exam: Gen:  NAD Cardiovascular:  RRR, No M/R/G Chest and lungs:   CTAB Abdomen:  Abdomen soft, NT/ND, + BS Extremities:  No C/E/C   Data Reviewed:    Labs: Basic Metabolic Panel:  Recent Labs Lab 02/27/15 0602 02/28/15 0149 03/01/15 0327  NA 140 140 139  K 3.9 4.1 4.3  CL 106 104 102  CO2 24 28 25   GLUCOSE 100* 108* 121*  BUN 14 18 27*  CREATININE 1.54* 1.63* 1.71*  CALCIUM 9.1 9.5 9.9   GFR Estimated Creatinine Clearance: 37.2 mL/min (by C-G formula based on Cr of 1.71). Liver Function Tests: No results for input(s): AST, ALT, ALKPHOS, BILITOT, PROT, ALBUMIN in the last 168 hours. No results for input(s): LIPASE, AMYLASE in the last 168 hours. No results for input(s): AMMONIA in the last 168 hours. Coagulation profile No results for input(s): INR, PROTIME in the last 168 hours.  CBC:  Recent Labs Lab 02/27/15 0602 03/01/15 0327  WBC 8.7 9.0  NEUTROABS 5.1  --   HGB 14.3 15.8  HCT 44.9 47.4  MCV 94.5 93.5  PLT 222 228   Cardiac Enzymes:  Recent Labs Lab 02/27/15 0602 02/27/15 1040 02/27/15 1651  TROPONINI 0.18* 0.17* 0.18*   BNP (last 3 results) No results for input(s): PROBNP in the last 8760 hours. CBG:  Recent Labs Lab  03/01/15 1156  GLUCAP 109*   D-Dimer: No results for input(s): DDIMER in the last 72 hours. Hgb A1c: No results for input(s): HGBA1C in the last 72 hours. Lipid Profile:  Recent Labs  03/01/15 0327  CHOL 194  HDL 53  LDLCALC 128*  TRIG 63  CHOLHDL 3.7   Thyroid function studies:  Recent Labs  02/27/15 1040  TSH 3.319   Anemia work up: No results for input(s): VITAMINB12, FOLATE, FERRITIN, TIBC, IRON, RETICCTPCT in the last 72 hours. Sepsis Labs:  Recent Labs Lab 02/27/15 0602 02/28/15 1558 03/01/15 0327  PROCALCITON  --  <0.10  --   WBC 8.7  --  9.0   Microbiology Recent Results (from the past 240 hour(s))  Urine culture     Status: None   Collection Time: 02/27/15  8:14 AM  Result Value Ref Range Status  Specimen Description URINE, CLEAN CATCH  Final   Special Requests NONE  Final   Culture MULTIPLE SPECIES PRESENT, SUGGEST RECOLLECTION  Final   Report Status 02/28/2015 FINAL  Final  Culture, blood (routine x 2) Call MD if unable to obtain prior to antibiotics being given     Status: None (Preliminary result)   Collection Time: 02/27/15 10:40 AM  Result Value Ref Range Status   Specimen Description BLOOD LEFT ARM  Final   Special Requests BOTTLES DRAWN AEROBIC ONLY 10CC  Final   Culture NO GROWTH 2 DAYS  Final   Report Status PENDING  Incomplete  Culture, blood (routine x 2) Call MD if unable to obtain prior to antibiotics being given     Status: None (Preliminary result)   Collection Time: 02/27/15 10:45 AM  Result Value Ref Range Status   Specimen Description BLOOD LEFT HAND  Final   Special Requests BOTTLES DRAWN AEROBIC AND ANAEROBIC 5CC  Final   Culture NO GROWTH 2 DAYS  Final   Report Status PENDING  Incomplete     Medications:   . aspirin  81 mg Oral Daily  . azithromycin  500 mg Oral Q24H  . cefTRIAXone (ROCEPHIN)  IV  1 g Intravenous Q24H  . cholecalciferol  1,000 Units Oral Daily  . guaiFENesin  1,200 mg Oral BID  . metoprolol  tartrate  37.5 mg Oral BID  . omega-3 acid ethyl esters  1 g Oral Daily  . potassium chloride  40 mEq Oral BID  . sodium chloride  3 mL Intravenous Q12H  . traMADol  50 mg Oral TID  . vitamin B-12  50 mcg Oral Daily   Continuous Infusions: . heparin 1,050 Units/hr (03/01/15 0423)  . nitroGLYCERIN 5 mcg/min (03/01/15 1159)    Time spent: 15 min   LOS: 2 days   Charlynne Cousins  [facility] Pager [phone]  *Please refer to [email or web address], password [email or web address] to get updated schedule on who will round on this patient, as hospitalists switch teams weekly. If 7PM-7AM, please contact night-coverage at [email or web address], password [email or web address] for any overnight needs.  03/01/2015, 1:25 PM

## 2015-03-01 NOTE — Progress Notes (Signed)
ANTICOAGULATION CONSULT NOTE - Follow Up Consult  Pharmacy Consult for Heparin Indication: afib  Allergies  Allergen Reactions  . Simvastatin     REACTION: myalgias    Patient Measurements: Height: 6' (182.9 cm) Weight: 190 lb 9.6 oz (86.456 kg) IBW/kg (Calculated) : 77.6 Heparin Dosing Weight: 88 kg  Vital Signs: Temp: 97.7 F (36.5 C) (11/09 1157) Temp Source: Oral (11/09 1157) BP: 115/78 mmHg (11/09 1157) Pulse Rate: 72 (11/09 1157)  Labs:  Recent Labs  02/27/15 0602 02/27/15 1040 02/27/15 1651 02/28/15 0149 02/28/15 1924 03/01/15 0327 03/01/15 1214  HGB 14.3  --   --   --   --  15.8  --   HCT 44.9  --   --   --   --  47.4  --   PLT 222  --   --   --   --  228  --   HEPARINUNFRC  --   --   --   --  0.58 0.75* 0.50  CREATININE 1.54*  --   --  1.63*  --  1.71*  --   TROPONINI 0.18* 0.17* 0.18*  --   --   --   --     Estimated Creatinine Clearance: 37.2 mL/min (by C-G formula based on Cr of 1.71).   Assessment: Worsening SOB, nonproductive cough, subjective fevers at home, urinary hesitancy/pain.  81 y/o F presents with above complaints and elevated troponins and BNP with no h/o CHF. Admit for CHF vs PNA. R/o DVT/PE and obtain Echo.  Anticoagulation: Dopplers negative for DVT. Now in afib. CHADSVASC score=7 (counting DM, 11.2%/year). Start IV heparin. Heparin level 0.75>>0.5 now back in goal range.  Goal of Therapy:  Heparin level 0.3-0.7 units/ml Monitor platelets by anticoagulation protocol: Yes   Plan:  Continue IV heparin at 1050 units/hr Daily heparin level and CBC Cath in AM   Magen Suriano S. Alford Highland, PharmD, BCPS Clinical Staff Pharmacist Pager [phone]  [place], [place] 03/01/2015,1:01 PM

## 2015-03-01 NOTE — Progress Notes (Signed)
Subjective: Breathing better  Objective: Vital signs in last 24 hours: Temp:  [97.4 F (36.3 C)-97.6 F (36.4 C)] 97.6 F (36.4 C) (11/09 0515) Pulse Rate:  [77-116] 77 (11/09 0515) Resp:  [20] 20 (11/09 0515) BP: (111-143)/(84-108) 111/84 mmHg (11/09 0515) SpO2:  [95 %-100 %] 100 % (11/09 0515) Weight:  [190 lb 9.6 oz (86.456 kg)] 190 lb 9.6 oz (86.456 kg) (11/09 0515) Last BM Date: 02/27/15  Intake/Output from previous day: 11/08 0701 - 11/09 0700 In: 1644 [P.O.:1440; I.V.:204] Out: 1775 [Urine:1775] Intake/Output this shift:    Medications Scheduled Meds: . aspirin  81 mg Oral Daily  . azithromycin  500 mg Oral Q24H  . cefTRIAXone (ROCEPHIN)  IV  1 g Intravenous Q24H  . cholecalciferol  1,000 Units Oral Daily  . furosemide  40 mg Intravenous 3 times per day  . guaiFENesin  1,200 mg Oral BID  . losartan  50 mg Oral Daily  . metoprolol tartrate  37.5 mg Oral BID  . omega-3 acid ethyl esters  1 g Oral Daily  . potassium chloride  40 mEq Oral BID  . sodium chloride  3 mL Intravenous Q12H  . traMADol  50 mg Oral TID  . vitamin B-12  50 mcg Oral Daily   Continuous Infusions: . heparin 1,050 Units/hr (03/01/15 0423)   PRN Meds:.sodium chloride, acetaminophen, dextromethorphan, diphenhydrAMINE, nicotine, ondansetron (ZOFRAN) IV, sodium chloride, technetium TC 36M diethylenetriame-pentaacetic acid  PE: General appearance: alert, cooperative and no distress Neck: no JVD Lungs: clear to auscultation bilaterally Heart: regular rate and rhythm, S1, S2 normal, no murmur, click, rub or gallop Abdomen: +BS, soft nontender Extremities: No LEE Pulses: 2+ and symmetric Skin: Warm and dry Neurologic: Grossly normal  Lab Results:   Recent Labs  02/27/15 0602 03/01/15 0327  WBC 8.7 9.0  HGB 14.3 15.8  HCT 44.9 47.4  PLT 222 228   BMET  Recent Labs  02/27/15 0602 02/28/15 0149 03/01/15 0327  NA 140 140 139  K 3.9 4.1 4.3  CL 106 104 102  CO2 24 28 25     GLUCOSE 100* 108* 121*  BUN 14 18 27*  CREATININE 1.54* 1.63* 1.71*  CALCIUM 9.1 9.5 9.9   PT/INR No results for input(s): LABPROT, INR in the last 72 hours. Cholesterol  Recent Labs  03/01/15 0327  CHOL 194   Lipid Panel     Component Value Date/Time   CHOL 194 03/01/2015 0327   TRIG 63 03/01/2015 0327   TRIG 119 05/01/2006 1043   HDL 53 03/01/2015 0327   CHOLHDL 3.7 03/01/2015 0327   CHOLHDL 3.2 CALC 05/01/2006 1043   VLDL 13 03/01/2015 0327   LDLCALC 128* 03/01/2015 0327   LDLDIRECT 164.4 10/10/2010 1012      Assessment/Plan     Acute CHF (congestive heart failure) ([place]) He noticed a change in his health about a month ago. He started having some exertional chest pressure which was more obvious two weeks ago. There was radiation to his right shoulder. He had some discomfort up until last night.   Today he shared that ~3weeks ago he was having chest pain, diaphoresis and thinks he passed out.  The pain lasted a couple days.  He thought he had a heart attack.   Net fluids: -0.1L/-0.9L.Not accurate.  He said he urinated a lot yesterday.  Wt down 3#.  Severely depressed EF 15-20%. SCr rising 1.63.>>1.71 BUN 27.  cozaar 50, lopressor 37.5 BID.   Stop cozaar and start IV  NTG for afterload reduction to help SCr.  CXR today.  Left heart cath is needed(Schedule for tomorrow at 0900hrs with Dr. Irish Lack), however, there is risk of worsening kidney disease. I think the potential benefit is more important. Need to further discuss with patient and family.   Elevated troponin Flat trend 0.18, 0.17, 0.18. Probably related to CHF, however, see above   Afib RVR New Afib. Appears to be in SR now with regular PVCs and PACs.  lopressor increased to 37.5 yesterday due to increased HR. CHADSVASC 5. IV heparin added yesterday for now but will need long term anticoagulation. With severe depressed EF, we need to think about adding digoxin instead for increasing  metoprolol much further. He was in SR yesterday, so amiodarone may be an option as well. Plus VT protection.    Hyperlipemia  LDL 03/2014 was 145. Recheck in AM.   Essential hypertension, benign Controlled.   PROSTATE CANCER, HX OF  CAP (community acquired pneumonia) ABX  Dysuria  CKD (chronic kidney disease), stage III Stable SCr. Worsening kidney function will likely be from cardiorenal syndrome.    Tobacco abuse: quit 25 years ago   LOS: 2 days    Kathaleen Dudziak PA-C 03/01/2015 9:59 AM

## 2015-03-01 NOTE — Progress Notes (Signed)
Patient refuses to have bed alarm on at night.

## 2015-03-02 ENCOUNTER — Encounter (HOSPITAL_COMMUNITY)
Admission: EM | Disposition: A | Discharge status: Home Health | Payer: Self-pay | Source: Home / Self Care | Attending: Internal Medicine

## 2015-03-02 ENCOUNTER — Other Ambulatory Visit: Payer: Self-pay

## 2015-03-02 ENCOUNTER — Encounter (HOSPITAL_COMMUNITY): Payer: Self-pay | Admitting: Interventional Cardiology

## 2015-03-02 DIAGNOSIS — I25118 Atherosclerotic heart disease of native coronary artery with other forms of angina pectoris: Secondary | ICD-10-CM | POA: Insufficient documentation

## 2015-03-02 DIAGNOSIS — I25119 Atherosclerotic heart disease of native coronary artery with unspecified angina pectoris: Secondary | ICD-10-CM

## 2015-03-02 DIAGNOSIS — E875 Hyperkalemia: Secondary | ICD-10-CM | POA: Insufficient documentation

## 2015-03-02 DIAGNOSIS — N179 Acute kidney failure, unspecified: Secondary | ICD-10-CM

## 2015-03-02 DIAGNOSIS — I509 Heart failure, unspecified: Secondary | ICD-10-CM | POA: Insufficient documentation

## 2015-03-02 HISTORY — PX: CARDIAC CATHETERIZATION: SHX172

## 2015-03-02 LAB — CREATININE, SERUM
Creatinine, Ser: 1.54 mg/dL — ABNORMAL HIGH (ref 0.61–1.24)
GFR calc Af Amer: 47 mL/min — ABNORMAL LOW (ref >60)
GFR calc non Af Amer: 41 mL/min — ABNORMAL LOW (ref >60)

## 2015-03-02 LAB — CBC
HCT: 47 % (ref 39.0–52.0)
HCT: 43.1 % (ref 39.0–52.0)
Hemoglobin: 15 g/dL (ref 13.0–17.0)
Hemoglobin: 14.4 g/dL (ref 13.0–17.0)
MCH: 30.2 pg (ref 26.0–34.0)
MCH: 31 pg (ref 26.0–34.0)
MCHC: 31.9 g/dL (ref 30.0–36.0)
MCHC: 33.4 g/dL (ref 30.0–36.0)
MCV: 94.8 fL (ref 78.0–100.0)
MCV: 92.9 fL (ref 78.0–100.0)
Platelets: 257 10*3/uL (ref 150–400)
Platelets: 237 10*3/uL (ref 150–400)
RBC: 4.96 MIL/uL (ref 4.22–5.81)
RBC: 4.64 MIL/uL (ref 4.22–5.81)
RDW: 15.2 % (ref 11.5–15.5)
RDW: 15.2 % (ref 11.5–15.5)
WBC: 10.7 10*3/uL — ABNORMAL HIGH (ref 4.0–10.5)
WBC: 8.3 10*3/uL (ref 4.0–10.5)

## 2015-03-02 LAB — BASIC METABOLIC PANEL
Anion gap: 11 (ref 5–15)
BUN: 33 mg/dL — ABNORMAL HIGH (ref 6–20)
CO2: 26 mmol/L (ref 22–32)
Calcium: 9.8 mg/dL (ref 8.9–10.3)
Chloride: 99 mmol/L — ABNORMAL LOW (ref 101–111)
Creatinine, Ser: 1.87 mg/dL — ABNORMAL HIGH (ref 0.61–1.24)
GFR calc Af Amer: 37 mL/min — ABNORMAL LOW (ref >60)
GFR calc non Af Amer: 32 mL/min — ABNORMAL LOW (ref >60)
Glucose, Bld: 116 mg/dL — ABNORMAL HIGH (ref 65–99)
Potassium: 5.5 mmol/L — ABNORMAL HIGH (ref 3.5–5.1)
Sodium: 136 mmol/L (ref 135–145)

## 2015-03-02 LAB — POCT ACTIVATED CLOTTING TIME: Activated Clotting Time: 472 seconds

## 2015-03-02 LAB — PROCALCITONIN: Procalcitonin: <0.1 ng/mL

## 2015-03-02 LAB — PROTIME-INR
INR: 1.21 (ref 0.00–1.49)
Prothrombin Time: 15.5 seconds — ABNORMAL HIGH (ref 11.6–15.2)

## 2015-03-02 LAB — HEPARIN LEVEL (UNFRACTIONATED): Heparin Unfractionated: 0.7 IU/mL (ref 0.30–0.70)

## 2015-03-02 SURGERY — LEFT HEART CATH AND CORONARY ANGIOGRAPHY

## 2015-03-02 MED ORDER — TICAGRELOR 90 MG PO TABS
ORAL_TABLET | ORAL | Status: DC | PRN
  Administered 2015-03-02: 180 mg via ORAL

## 2015-03-02 MED ORDER — EZETIMIBE 10 MG PO TABS
10.0000 mg | ORAL_TABLET | Freq: Every day | ORAL | Status: DC
  Administered 2015-03-02 – 2015-03-03 (×2): 10 mg via ORAL
  Filled 2015-03-02 (×2): qty 1

## 2015-03-02 MED ORDER — HYDRALAZINE HCL 20 MG/ML IJ SOLN
10.0000 mg | Freq: Four times a day (QID) | INTRAMUSCULAR | Status: DC | PRN
Start: 2015-03-02 — End: 2015-03-03
  Administered 2015-03-02: 10 mg via INTRAVENOUS
  Filled 2015-03-02: qty 1

## 2015-03-02 MED ORDER — FUROSEMIDE 10 MG/ML IJ SOLN
20.0000 mg | Freq: Once | INTRAMUSCULAR | Status: AC
  Administered 2015-03-02: 18:00:00 20 mg via INTRAVENOUS
  Filled 2015-03-02: qty 2

## 2015-03-02 MED ORDER — TICAGRELOR 90 MG PO TABS: 90.0000 mg | ORAL_TABLET | Freq: Two times a day (BID) | ORAL | Status: DC

## 2015-03-02 MED ORDER — SODIUM CHLORIDE 0.9 % IJ SOLN: 3.0000 mL | INTRAMUSCULAR | Status: DC | PRN

## 2015-03-02 MED ORDER — LIDOCAINE HCL (PF) 1 % IJ SOLN
INTRAMUSCULAR | Status: AC
  Filled 2015-03-02: qty 30

## 2015-03-02 MED ORDER — VERAPAMIL HCL 2.5 MG/ML IV SOLN
INTRAVENOUS | Status: DC | PRN
  Administered 2015-03-02: 09:00:00 via INTRA_ARTERIAL

## 2015-03-02 MED ORDER — SODIUM CHLORIDE 0.9 % IV SOLN: INTRAVENOUS | Status: AC

## 2015-03-02 MED ORDER — SODIUM CHLORIDE 0.9 % IV SOLN: 250.0000 mL | INTRAVENOUS | Status: DC | PRN

## 2015-03-02 MED ORDER — NITROGLYCERIN 1 MG/10 ML FOR IR/CATH LAB: INTRA_ARTERIAL | Status: DC | PRN

## 2015-03-02 MED ORDER — ALPRAZOLAM 0.25 MG PO TABS
0.2500 mg | ORAL_TABLET | Freq: Two times a day (BID) | ORAL | Status: DC | PRN
  Administered 2015-03-02 – 2015-03-03 (×3): 0.25 mg via ORAL
  Filled 2015-03-02 (×3): qty 1

## 2015-03-02 MED ORDER — SODIUM POLYSTYRENE SULFONATE 15 GM/60ML PO SUSP
30.0000 g | Freq: Once | ORAL | Status: AC
  Administered 2015-03-02: 30 g via ORAL
  Filled 2015-03-02: qty 120

## 2015-03-02 MED ORDER — CLOPIDOGREL BISULFATE 75 MG PO TABS
300.0000 mg | ORAL_TABLET | Freq: Once | ORAL | Status: AC
Start: 2015-03-02 — End: 2015-03-02
  Administered 2015-03-02: 22:00:00 300 mg via ORAL
  Filled 2015-03-02: qty 4

## 2015-03-02 MED ORDER — SODIUM CHLORIDE 0.9 % IJ SOLN
3.0000 mL | Freq: Two times a day (BID) | INTRAMUSCULAR | Status: DC
  Administered 2015-03-02: 14:00:00 3 mL via INTRAVENOUS

## 2015-03-02 MED ORDER — MIDAZOLAM HCL 2 MG/2ML IJ SOLN
INTRAMUSCULAR | Status: AC
  Filled 2015-03-02: qty 4

## 2015-03-02 MED ORDER — ASPIRIN 81 MG PO CHEW: 81.0000 mg | CHEWABLE_TABLET | Freq: Every day | ORAL | Status: DC

## 2015-03-02 MED ORDER — VERAPAMIL HCL 2.5 MG/ML IV SOLN
INTRAVENOUS | Status: AC
  Filled 2015-03-02: qty 2

## 2015-03-02 MED ORDER — FENTANYL CITRATE (PF) 100 MCG/2ML IJ SOLN
INTRAMUSCULAR | Status: DC | PRN
  Administered 2015-03-02 (×2): 25 ug via INTRAVENOUS

## 2015-03-02 MED ORDER — NITROGLYCERIN 1 MG/10 ML FOR IR/CATH LAB
INTRA_ARTERIAL | Status: DC | PRN
  Administered 2015-03-02: 10:00:00

## 2015-03-02 MED ORDER — HEPARIN SODIUM (PORCINE) 5000 UNIT/ML IJ SOLN: 5000.0000 [IU] | Freq: Three times a day (TID) | INTRAMUSCULAR | Status: DC

## 2015-03-02 MED ORDER — BIVALIRUDIN BOLUS VIA INFUSION - CUPID
INTRAVENOUS | Status: DC | PRN
  Administered 2015-03-02: 65.55 mg via INTRAVENOUS

## 2015-03-02 MED ORDER — BIVALIRUDIN 250 MG IV SOLR
INTRAVENOUS | Status: AC
  Filled 2015-03-02: qty 250

## 2015-03-02 MED ORDER — ANGIOPLASTY BOOK
Freq: Once | Status: AC
Start: 2015-03-02 — End: 2015-03-02
  Administered 2015-03-02: 17:00:00
  Filled 2015-03-02: qty 1

## 2015-03-02 MED ORDER — HEPARIN (PORCINE) IN NACL 100-0.45 UNIT/ML-% IJ SOLN
1050.0000 [IU]/h | INTRAMUSCULAR | Status: DC
  Administered 2015-03-02: 1050 [IU]/h via INTRAVENOUS
  Filled 2015-03-02: qty 250

## 2015-03-02 MED ORDER — IOHEXOL 350 MG/ML SOLN
INTRAVENOUS | Status: DC | PRN
  Administered 2015-03-02: 70 mL via INTRA_ARTERIAL

## 2015-03-02 MED ORDER — LIVING BETTER WITH HEART FAILURE BOOK
Freq: Once | Status: AC
Start: 2015-03-02 — End: 2015-03-02
  Administered 2015-03-02: 16:00:00
  Filled 2015-03-02: qty 1

## 2015-03-02 MED ORDER — ACETAMINOPHEN 325 MG PO TABS: 650.0000 mg | ORAL_TABLET | ORAL | Status: DC | PRN

## 2015-03-02 MED ORDER — SODIUM CHLORIDE 0.9 % IV SOLN
INTRAVENOUS | Status: DC | PRN
  Administered 2015-03-02: 250 mL via INTRAVENOUS

## 2015-03-02 MED ORDER — LEVOFLOXACIN 500 MG PO TABS: 500.0000 mg | ORAL_TABLET | Freq: Every day | ORAL | Status: DC

## 2015-03-02 MED ORDER — LEVOFLOXACIN 750 MG PO TABS
750.0000 mg | ORAL_TABLET | ORAL | Status: DC
  Administered 2015-03-02: 750 mg via ORAL
  Filled 2015-03-02: qty 1

## 2015-03-02 MED ORDER — MIDAZOLAM HCL 2 MG/2ML IJ SOLN
INTRAMUSCULAR | Status: DC | PRN
  Administered 2015-03-02 (×2): 1 mg via INTRAVENOUS

## 2015-03-02 MED ORDER — SACCHAROMYCES BOULARDII 250 MG PO CAPS
250.0000 mg | ORAL_CAPSULE | Freq: Two times a day (BID) | ORAL | Status: DC
  Administered 2015-03-02 – 2015-03-03 (×3): 250 mg via ORAL
  Filled 2015-03-02 (×4): qty 1

## 2015-03-02 MED ORDER — HEPARIN SODIUM (PORCINE) 1000 UNIT/ML IJ SOLN
INTRAMUSCULAR | Status: DC | PRN
  Administered 2015-03-02: 4000 [IU] via INTRAVENOUS

## 2015-03-02 MED ORDER — HEPARIN (PORCINE) IN NACL 2-0.9 UNIT/ML-% IJ SOLN
INTRAMUSCULAR | Status: AC
  Filled 2015-03-02: qty 1500

## 2015-03-02 MED ORDER — ONDANSETRON HCL 4 MG/2ML IJ SOLN: 4.0000 mg | Freq: Four times a day (QID) | INTRAMUSCULAR | Status: DC | PRN

## 2015-03-02 MED ORDER — CLOPIDOGREL BISULFATE 75 MG PO TABS
75.0000 mg | ORAL_TABLET | Freq: Every day | ORAL | Status: DC
  Administered 2015-03-03: 09:00:00 75 mg via ORAL
  Filled 2015-03-02: qty 1

## 2015-03-02 MED ORDER — FENTANYL CITRATE (PF) 100 MCG/2ML IJ SOLN
INTRAMUSCULAR | Status: AC
  Filled 2015-03-02: qty 4

## 2015-03-02 MED ORDER — NITROGLYCERIN 1 MG/10 ML FOR IR/CATH LAB
INTRA_ARTERIAL | Status: AC
Start: 2015-03-02 — End: 2015-03-02
  Filled 2015-03-02: qty 10

## 2015-03-02 MED ORDER — HEPARIN SODIUM (PORCINE) 1000 UNIT/ML IJ SOLN
INTRAMUSCULAR | Status: AC
  Filled 2015-03-02: qty 1

## 2015-03-02 MED ORDER — SODIUM CHLORIDE 0.9 % IV SOLN
250.0000 mg | INTRAVENOUS | Status: DC | PRN
  Administered 2015-03-02: 1.75 mg/kg/h via INTRAVENOUS

## 2015-03-02 SURGICAL SUPPLY — 19 items
BALLN EMERGE MR 2.5X12 (BALLOONS) ×4
BALLN ~~LOC~~ EMERGE MR 3.0X8 (BALLOONS) ×4
BALLOON EMERGE MR 2.5X12 (BALLOONS) ×2 IMPLANT
BALLOON ~~LOC~~ EMERGE MR 3.0X8 (BALLOONS) ×2 IMPLANT
CATH INFINITI 5 FR JL3.5 (CATHETERS) ×4 IMPLANT
CATH INFINITI JR4 5F (CATHETERS) ×4 IMPLANT
DEVICE RAD COMP TR BAND LRG (VASCULAR PRODUCTS) ×4 IMPLANT
GLIDESHEATH SLEND SS 6F .021 (SHEATH) ×4 IMPLANT
GUIDE CATH RUNWAY 6FR CLS3 (CATHETERS) ×4 IMPLANT
KIT ENCORE 26 ADVANTAGE (KITS) ×4 IMPLANT
KIT HEART LEFT (KITS) ×4 IMPLANT
PACK CARDIAC CATHETERIZATION (CUSTOM PROCEDURE TRAY) ×4 IMPLANT
STENT SYNERGY DES 2.5X16 (Permanent Stent) ×4 IMPLANT
TRANSDUCER W/STOPCOCK (MISCELLANEOUS) ×4 IMPLANT
TUBING CIL FLEX 10 FLL-RA (TUBING) ×4 IMPLANT
VALVE GUARDIAN II ~~LOC~~ HEMO (MISCELLANEOUS) ×4 IMPLANT
WIRE ASAHI PROWATER 180CM (WIRE) ×4 IMPLANT
WIRE HI TORQ BMW 190CM (WIRE) ×4 IMPLANT
WIRE SAFE-T 1.5MM-J .035X260CM (WIRE) ×4 IMPLANT

## 2015-03-02 NOTE — Progress Notes (Signed)
[facility] PROGRESS NOTE    Progress Note   Kyle [name] [id number] DOB: 1932/02/07 DOA: 02/27/2015 PCP: Viviana Simpler, MD   Brief Narrative:   Kyle [name] is an 81 y.o. male with past medical history of chronic renal disease prostate cancer and stroke that comes in for progressing worsening dyspnea, orthopnea and nonproductive cough.  Assessment/Plan:   Acute systolic heart failure/newly diagnosed cardiomyopathy: 2-D echo was done that showed an ejection fraction less than 30%, cardiology was consulted and he was started on IV Lasix,  Cardiology recommended cardiac cath to be done on 03/02/2015. Report pending anticoagulation per cardiology Cannot rule out pneumonia as the patient is a poor historian, change antibiotics coverage to levaquin.  New onset atrial fibrillation: - CHADSVASC 5 - Continue metoprolol. Needs to be on oral anticoagulation  Elevated troponins: The setting of A. fib with RVR, she also relates a history of severe chest pain about a week ago.  Essential hypertension: Continue metoprolol certainly was held to 2 new mild increasing creatinine.  AKI on Stage III chronic kidney disease: Creatinine1.3-0.5,there has been mild increased, now 1.8. -renal ultrasound was performed and show no hydronephrosis.  Hyperkalemia: - give kayexalate,. Recheck a b-met in am.    DVT Prophylaxis - Lovenox ordered.  Family Communication: none Disposition Plan: Home when stable. Code Status:     Code Status Orders        Start     Ordered   02/27/15 0901  Full code   Continuous     02/27/15 0900        IV Access:    Peripheral IV   Procedures and diagnostic studies:   Dg Chest 2 View  03/01/2015  CLINICAL DATA:  Heart failure subsequent encounter, scheduled for heart catheterization tomorrow EXAM: CHEST  2 VIEW COMPARISON:  02/27/2015 FINDINGS: Limited inspiratory effect with mild elevation of the right diaphragm. Moderate cardiac  enlargement with vascular congestion similar to prior study. No evidence of edema consolidation or effusion. IMPRESSION: Chronic hypoventilatory change. Cardiac enlargement. No acute findings. Electronically Signed   By: Skipper Cliche M.D.   On: 03/01/2015 11:53   US Renal  02/28/2015  CLINICAL DATA:  Chronic kidney disease. EXAM: RENAL / URINARY TRACT ULTRASOUND COMPLETE COMPARISON:  CT 03/11/2011 FINDINGS: Right Kidney: Length: 10.7 cm. Echogenicity within normal limits. No mass or hydronephrosis visualized. Left Kidney: Length: 10.4 cm. Echogenicity within normal limits. No mass or hydronephrosis visualized. Bladder: Appears normal for degree of bladder distention. IMPRESSION: No acute findings. Electronically Signed   By: Rolm Baptise M.D.   On: 02/28/2015 15:40    2 D Echo 02/27/2015: Study Conclusions  - Left ventricle: The cavity size was normal. Wall thickness was increased in a pattern of mild LVH. - Mitral valve: There was mild regurgitation. - Right ventricle: Systolic function was mildly reduced. - Tricuspid valve: There was moderate regurgitation. - Left ventricle: LVEF is severely depressed at 15 to 20%. The cavity size was normal. Wall thickness was increased in a pattern of mild LVH.  Bilateral lower extremity venous duplex completed.  Preliminary report: There is no DVT or SVT noted in the bilateral lower extremities.   Procedures:  None  Antibiotics:  Azithromycin 11/7 >  IV Rocephin 11/7 >  Medical Consultants:    None.  Anti-Infectives:   Anti-infectives    Start     Dose/Rate Route Frequency Ordered Stop   02/27/15 1000  cefTRIAXone (ROCEPHIN) 1 g in dextrose 5 % 50 mL IVPB  1 g 100 mL/hr over 30 Minutes Intravenous Every 24 hours 02/27/15 0900 03/06/15 0959   02/27/15 1000  azithromycin (ZITHROMAX) tablet 500 mg     500 mg Oral Every 24 hours 02/27/15 0900 03/06/15 6433      Subjective:    Kyle [name] no new complaints  poor historian.  Objective:    Filed Vitals:   03/02/15 0953 03/02/15 0957 03/02/15 1002 03/02/15 1100  BP: 132/78 132/85  113/82  Pulse: 65 0 0 64  Temp:      TempSrc:      Resp: 19 0 19 21  Height:      Weight:      SpO2: 95% 0% 0% 99%    Intake/Output Summary (Last 24 hours) at 03/02/15 1223 Last data filed at 03/01/15 2300  Gross per 24 hour  Intake 635.03 ml  Output    200 ml  Net 435.03 ml   Filed Weights   02/28/15 0431 03/01/15 0515 03/02/15 0600  Weight: 87.971 kg (193 lb 15 oz) 86.456 kg (190 lb 9.6 oz) 87.408 kg (192 lb 11.2 oz)    Exam: Gen:  NAD Cardiovascular:  RRR, No M/R/G Chest and lungs:   CTAB Abdomen:  Abdomen soft, NT/ND, + BS Extremities:  No C/E/C   Data Reviewed:    Labs: Basic Metabolic Panel:  Recent Labs Lab 02/27/15 0602 02/28/15 0149 03/01/15 0327 03/02/15 0427  NA 140 140 139 136  K 3.9 4.1 4.3 5.5*  CL 106 104 102 99*  CO2 24 28 25 26   GLUCOSE 100* 108* 121* 116*  BUN 14 18 27* 33*  CREATININE 1.54* 1.63* 1.71* 1.87*  CALCIUM 9.1 9.5 9.9 9.8   GFR Estimated Creatinine Clearance: 34 mL/min (by C-G formula based on Cr of 1.87). Liver Function Tests: No results for input(s): AST, ALT, ALKPHOS, BILITOT, PROT, ALBUMIN in the last 168 hours. No results for input(s): LIPASE, AMYLASE in the last 168 hours. No results for input(s): AMMONIA in the last 168 hours. Coagulation profile  Recent Labs Lab 03/02/15 0420  INR 1.21    CBC:  Recent Labs Lab 02/27/15 0602 03/01/15 0327 03/02/15 0427  WBC 8.7 9.0 10.7*  NEUTROABS 5.1  --   --   HGB 14.3 15.8 15.0  HCT 44.9 47.4 47.0  MCV 94.5 93.5 94.8  PLT 222 228 257   Cardiac Enzymes:  Recent Labs Lab 02/27/15 0602 02/27/15 1040 02/27/15 1651  TROPONINI 0.18* 0.17* 0.18*   BNP (last 3 results) No results for input(s): PROBNP in the last 8760 hours. CBG:  Recent Labs Lab 03/01/15 1156 03/01/15 1618  GLUCAP 109* 107*   D-Dimer: No results for input(s):  DDIMER in the last 72 hours. Hgb A1c: No results for input(s): HGBA1C in the last 72 hours. Lipid Profile:  Recent Labs  03/01/15 0327  CHOL 194  HDL 53  LDLCALC 128*  TRIG 63  CHOLHDL 3.7   Thyroid function studies: No results for input(s): TSH, T4TOTAL, T3FREE, THYROIDAB in the last 72 hours.  Invalid input(s): FREET3 Anemia work up: No results for input(s): VITAMINB12, FOLATE, FERRITIN, TIBC, IRON, RETICCTPCT in the last 72 hours. Sepsis Labs:  Recent Labs Lab 02/27/15 0602 02/28/15 1558 03/01/15 0327 03/02/15 0427  PROCALCITON  --  <0.10  --  <0.10  WBC 8.7  --  9.0 10.7*   Microbiology Recent Results (from the past 240 hour(s))  Urine culture     Status: None   Collection Time: 02/27/15  8:14  AM  Result Value Ref Range Status   Specimen Description URINE, CLEAN CATCH  Final   Special Requests NONE  Final   Culture MULTIPLE SPECIES PRESENT, SUGGEST RECOLLECTION  Final   Report Status 02/28/2015 FINAL  Final  Culture, blood (routine x 2) Call MD if unable to obtain prior to antibiotics being given     Status: None (Preliminary result)   Collection Time: 02/27/15 10:40 AM  Result Value Ref Range Status   Specimen Description BLOOD LEFT ARM  Final   Special Requests BOTTLES DRAWN AEROBIC ONLY 10CC  Final   Culture NO GROWTH 2 DAYS  Final   Report Status PENDING  Incomplete  Culture, blood (routine x 2) Call MD if unable to obtain prior to antibiotics being given     Status: None (Preliminary result)   Collection Time: 02/27/15 10:45 AM  Result Value Ref Range Status   Specimen Description BLOOD LEFT HAND  Final   Special Requests BOTTLES DRAWN AEROBIC AND ANAEROBIC 5CC  Final   Culture NO GROWTH 2 DAYS  Final   Report Status PENDING  Incomplete     Medications:   . aspirin  81 mg Oral Daily  . azithromycin  500 mg Oral Q24H  . cefTRIAXone (ROCEPHIN)  IV  1 g Intravenous Q24H  . cholecalciferol  1,000 Units Oral Daily  . clopidogrel  300 mg Oral Once  .  [START ON 03/03/2015] clopidogrel  75 mg Oral Daily  . ezetimibe  10 mg Oral Daily  . guaiFENesin  1,200 mg Oral BID  . metoprolol tartrate  37.5 mg Oral BID  . omega-3 acid ethyl esters  1 g Oral Daily  . sodium chloride  3 mL Intravenous Q12H  . sodium chloride  3 mL Intravenous Q12H  . traMADol  50 mg Oral TID  . vitamin B-12  50 mcg Oral Daily   Continuous Infusions: . sodium chloride    . nitroGLYCERIN Stopped (03/02/15 0626)    Time spent: 15 min   LOS: 3 days   Charlynne Cousins  [facility] Pager [phone]  *Please refer to [email or web address], password [email or web address] to get updated schedule on who will round on this patient, as hospitalists switch teams weekly. If 7PM-7AM, please contact night-coverage at [email or web address], password [email or web address] for any overnight needs.  03/02/2015, 12:23 PM

## 2015-03-02 NOTE — Progress Notes (Signed)
Subjective: No orthopnea/SOB  Objective: Vital signs in last 24 hours: Temp:  [97.7 F (36.5 C)-97.8 F (36.6 C)] 97.7 F (36.5 C) (11/10 0600) Pulse Rate:  [61-74] 61 (11/10 0600) Resp:  [18] 18 (11/10 0600) BP: (109-143)/(62-78) 109/62 mmHg (11/10 0600) SpO2:  [94 %-98 %] 98 % (11/10 0600) Weight:  [192 lb 11.2 oz (87.408 kg)] 192 lb 11.2 oz (87.408 kg) (11/10 0600) Last BM Date: 02/27/15  Intake/Output from previous day: 11/09 0701 - 11/10 0700 In: [place] [P.O.:680; I.V.:195] Out: 425 [Urine:425] Intake/Output this shift:    Medications Scheduled Meds: . aspirin  81 mg Oral Daily  . azithromycin  500 mg Oral Q24H  . cefTRIAXone (ROCEPHIN)  IV  1 g Intravenous Q24H  . cholecalciferol  1,000 Units Oral Daily  . guaiFENesin  1,200 mg Oral BID  . metoprolol tartrate  37.5 mg Oral BID  . omega-3 acid ethyl esters  1 g Oral Daily  . sodium chloride  3 mL Intravenous Q12H  . sodium chloride  3 mL Intravenous Q12H  . traMADol  50 mg Oral TID  . vitamin B-12  50 mcg Oral Daily   Continuous Infusions: . sodium chloride 10 mL/hr at 03/02/15 0604  . heparin 1,050 Units/hr (03/02/15 0427)  . nitroGLYCERIN 5 mcg/min (03/01/15 1159)   PRN Meds:.sodium chloride, sodium chloride, acetaminophen, dextromethorphan, diphenhydrAMINE, nicotine, ondansetron (ZOFRAN) IV, sodium chloride, sodium chloride, technetium TC 1M diethylenetriame-pentaacetic acid  PE: General appearance: alert, cooperative and no distress Neck: no JVD Lungs: clear to auscultation bilaterally Heart: regular rate and rhythm, S1, S2 normal, no murmur, click, rub or gallop Abdomen: +BS, soft nontender Extremities: No LEE Pulses: 2+ and symmetric Skin: Warm and dry Neurologic: Grossly normal  Lab Results:   Recent Labs  03/01/15 0327 03/02/15 0427  WBC 9.0 10.7*  HGB 15.8 15.0  HCT 47.4 47.0  PLT 228 257   BMET  Recent Labs  02/28/15 0149 03/01/15 0327 03/02/15 0427  NA 140 139 136  K 4.1  4.3 5.5*  CL 104 102 99*  CO2 28 25 26   GLUCOSE 108* 121* 116*  BUN 18 27* 33*  CREATININE 1.63* 1.71* 1.87*  CALCIUM 9.5 9.9 9.8   PT/INR  Recent Labs  03/02/15 0420  LABPROT 15.5*  INR 1.21   Cholesterol  Recent Labs  03/01/15 0327  CHOL 194   Lipid Panel     Component Value Date/Time   CHOL 194 03/01/2015 0327   TRIG 63 03/01/2015 0327   TRIG 119 05/01/2006 1043   HDL 53 03/01/2015 0327   CHOLHDL 3.7 03/01/2015 0327   CHOLHDL 3.2 CALC 05/01/2006 1043   VLDL 13 03/01/2015 0327   LDLCALC 128* 03/01/2015 0327   LDLDIRECT 164.4 10/10/2010 1012     Assessment/Plan Acute CHF (congestive heart failure) ([place]) He noticed a change in his health about a month ago. He started having some exertional chest pressure which was more obvious two weeks ago. There was radiation to his right shoulder. He had some discomfort up until last night.   On 11/9 he shared that ~3weeks ago he was having chest pain, diaphoresis and thinks he passed out. The pain lasted a couple days. He thought he had a heart attack which is likely the case.  Net fluids: Not accurate. He said he urinated a lot yesterday. Wt up 2# from yesterday. Severely depressed EF 15-20%. SCr rising 1.63.>>1.71 >>1.87.  cozaar 50(stopped yesterday and IV NTG started), lopressor 37.5 BID.  CXR yesterday  improved.  No acute findings.  Left heart cath is needed(Schedule for today at 0900hrs with Dr. Irish Lack). Tthere is risk of worsening kidney disease. I think the potential benefit is more important. Need to further discuss with patient and family.   Continue to hold lasix.   He will need [facility] consult after cath.      Elevated troponin Flat trend 0.18, 0.17, 0.18. Probably related to CHF, however, see above   Afib RVR New Afib. Still in SR with very frequent PVCs and PACs. lopressor increased to 37.5 yesterday due to increased HR. CHADSVASC 5. IV heparin added yesterday for now but  will need long term anticoagulation. With severe depressed EF, we need to think about adding digoxin instead for increasing metoprolol much further. Amiodarone may be an option as well. Plus VT protection.    Hyperlipemia  LDL 128. Myalgias from simvastatin. On lovaza.  Will add zetia.   Essential hypertension, benign Controlled.   PROSTATE CANCER, HX OF  CAP (community acquired pneumonia) ABX  Dysuria  CKD (chronic kidney disease), stage III Slowly trending up SCr 1.63 >>1.71>>1.87. Worsening kidney function will likely be from cardiorenal syndrome.  May need fluids and milrinone after cath.  US kidney appeared normal.    Hyperkalemia Potassium supplements DCd this morning.    PVCs/trigeminy     LOS: 3 days    Chidera Thivierge PA-C 03/02/2015 7:52 AM

## 2015-03-02 NOTE — Progress Notes (Signed)
TR BAND REMOVAL  LOCATION:    right radial  DEFLATED PER PROTOCOL:    Yes.    TIME BAND OFF / DRESSING APPLIED:    1440   SITE UPON ARRIVAL:    Level 0  SITE AFTER BAND REMOVAL:    Level 0  CIRCULATION SENSATION AND MOVEMENT:    Within Normal Limits   Yes.    COMMENTS:   Tolerated procedure well

## 2015-03-02 NOTE — Progress Notes (Addendum)
ANTICOAGULATION CONSULT NOTE - Follow Up Consult  Pharmacy Consult for heparin  Indication: atrial fibrillation  Allergies  Allergen Reactions  . Simvastatin     REACTION: myalgias    Patient Measurements: Height: 6' (182.9 cm) Weight: 192 lb 11.2 oz (87.408 kg) (scale b) IBW/kg (Calculated) : 77.6   Vital Signs: Temp: 97.7 F (36.5 C) (11/10 0743) Temp Source: Oral (11/10 0743) BP: 132/85 mmHg (11/10 0957) Pulse Rate: 0 (11/10 1002)  Labs:  Recent Labs  02/27/15 1651 02/28/15 0149  03/01/15 0327 03/01/15 1214 03/02/15 0420 03/02/15 0427  HGB  --   --   --  15.8  --   --  15.0  HCT  --   --   --  47.4  --   --  47.0  PLT  --   --   --  228  --   --  257  LABPROT  --   --   --   --   --  15.5*  --   INR  --   --   --   --   --  1.21  --   HEPARINUNFRC  --   --   < > 0.75* 0.50 0.70  --   CREATININE  --  1.63*  --  1.71*  --   --  1.87*  TROPONINI 0.18*  --   --   --   --   --   --   < > = values in this interval not displayed.  Estimated Creatinine Clearance: 34 mL/min (by C-G formula based on Cr of 1.87).   Medications:  Scheduled:  . aspirin  81 mg Oral Daily  . azithromycin  500 mg Oral Q24H  . cefTRIAXone (ROCEPHIN)  IV  1 g Intravenous Q24H  . cholecalciferol  1,000 Units Oral Daily  . clopidogrel  300 mg Oral Once  . [START ON 03/03/2015] clopidogrel  75 mg Oral Daily  . ezetimibe  10 mg Oral Daily  . guaiFENesin  1,200 mg Oral BID  . metoprolol tartrate  37.5 mg Oral BID  . omega-3 acid ethyl esters  1 g Oral Daily  . sodium chloride  3 mL Intravenous Q12H  . sodium chloride  3 mL Intravenous Q12H  . traMADol  50 mg Oral TID  . vitamin B-12  50 mcg Oral Daily    Assessment: 81 yo male s/p cath w/ DES to LAD. He is also noted with afib (CHADSVASC=7; age, HTN, CAD, hx CVA, HF) and heparin to restart 6 hours post TR band removal (planned for ~ 3pm). Last heparin rate was 1050 units/hr and heparin level was 0.7 -Plans noted for oral  anticoagulation: Spoke with Dr. Irish Lack- change Brilinta to plavix (300mg  po today then 75mg /day beginning in am)  Goal of Therapy:  Heparin level 0.3-0.7 units/ml Monitor platelets by anticoagulation protocol: Yes   Plan:  -Restart heparin at 1050 units/hr 6 hours post TR band removal (~ 9pm) -Will follow for TR band removal -Switch Brilinta to plavix with plans for triple therapy (could consider short course of aspirin or discontinuing aspirin?) -Heparin level  daily wth CBC daily  Hildred Laser, Pharm D 03/02/2015 12:05 PM

## 2015-03-02 NOTE — H&P (View-Only) (Signed)
  Subjective: Breathing better  Objective: Vital signs in last 24 hours: Temp:  [97.4 F (36.3 C)-97.6 F (36.4 C)] 97.6 F (36.4 C) (11/09 0515) Pulse Rate:  [77-116] 77 (11/09 0515) Resp:  [20] 20 (11/09 0515) BP: (111-143)/(84-108) 111/84 mmHg (11/09 0515) SpO2:  [95 %-100 %] 100 % (11/09 0515) Weight:  [190 lb 9.6 oz (86.456 kg)] 190 lb 9.6 oz (86.456 kg) (11/09 0515) Last BM Date: 02/27/15  Intake/Output from previous day: 11/08 0701 - 11/09 0700 In: 1644 [P.O.:1440; I.V.:204] Out: 1775 [Urine:1775] Intake/Output this shift:    Medications Scheduled Meds: . aspirin  81 mg Oral Daily  . azithromycin  500 mg Oral Q24H  . cefTRIAXone (ROCEPHIN)  IV  1 g Intravenous Q24H  . cholecalciferol  1,000 Units Oral Daily  . furosemide  40 mg Intravenous 3 times per day  . guaiFENesin  1,200 mg Oral BID  . losartan  50 mg Oral Daily  . metoprolol tartrate  37.5 mg Oral BID  . omega-3 acid ethyl esters  1 g Oral Daily  . potassium chloride  40 mEq Oral BID  . sodium chloride  3 mL Intravenous Q12H  . traMADol  50 mg Oral TID  . vitamin B-12  50 mcg Oral Daily   Continuous Infusions: . heparin 1,050 Units/hr (03/01/15 0423)   PRN Meds:.sodium chloride, acetaminophen, dextromethorphan, diphenhydrAMINE, nicotine, ondansetron (ZOFRAN) IV, sodium chloride, technetium TC 99M diethylenetriame-pentaacetic acid  PE: General appearance: alert, cooperative and no distress Neck: no JVD Lungs: clear to auscultation bilaterally Heart: regular rate and rhythm, S1, S2 normal, no murmur, click, rub or gallop Abdomen: +BS, soft nontender Extremities: No LEE Pulses: 2+ and symmetric Skin: Warm and dry Neurologic: Grossly normal  Lab Results:   Recent Labs  02/27/15 0602 03/01/15 0327  WBC 8.7 9.0  HGB 14.3 15.8  HCT 44.9 47.4  PLT 222 228   BMET  Recent Labs  02/27/15 0602 02/28/15 0149 03/01/15 0327  NA 140 140 139  K 3.9 4.1 4.3  CL 106 104 102  CO2 24 28 25    GLUCOSE 100* 108* 121*  BUN 14 18 27*  CREATININE 1.54* 1.63* 1.71*  CALCIUM 9.1 9.5 9.9   PT/INR No results for input(s): LABPROT, INR in the last 72 hours. Cholesterol  Recent Labs  03/01/15 0327  CHOL 194   Lipid Panel     Component Value Date/Time   CHOL 194 03/01/2015 0327   TRIG 63 03/01/2015 0327   TRIG 119 05/01/2006 1043   HDL 53 03/01/2015 0327   CHOLHDL 3.7 03/01/2015 0327   CHOLHDL 3.2 CALC 05/01/2006 1043   VLDL 13 03/01/2015 0327   LDLCALC 128* 03/01/2015 0327   LDLDIRECT 164.4 10/10/2010 1012      Assessment/Plan     Acute CHF (congestive heart failure) ([facility]) He noticed a change in his health about a month ago. He started having some exertional chest pressure which was more obvious two weeks ago. There was radiation to his right shoulder. He had some discomfort up until last night.   Today he shared that ~3weeks ago he was having chest pain, diaphoresis and thinks he passed out.  The pain lasted a couple days.  He thought he had a heart attack.   Net fluids: -0.1L/-0.9L.Not accurate.  He said he urinated a lot yesterday.  Wt down 3#.  Severely depressed EF 15-20%. SCr rising 1.63.>>1.71 BUN 27.  cozaar 50, lopressor 37.5 BID.   Stop cozaar and start IV   NTG for afterload reduction to help SCr.  CXR today.  Left heart cath is needed(Schedule for tomorrow at 0900hrs with Dr. Varanasi), however, there is risk of worsening kidney disease. I think the potential benefit is more important. Need to further discuss with patient and family.   Elevated troponin Flat trend 0.18, 0.17, 0.18. Probably related to CHF, however, see above   Afib RVR New Afib. Appears to be in SR now with regular PVCs and PACs.  lopressor increased to 37.5 yesterday due to increased HR. CHADSVASC 5. IV heparin added yesterday for now but will need long term anticoagulation. With severe depressed EF, we need to think about adding digoxin instead for increasing  metoprolol much further. He was in SR yesterday, so amiodarone may be an option as well. Plus VT protection.    Hyperlipemia  LDL 03/2014 was 145. Recheck in AM.   Essential hypertension, benign Controlled.   PROSTATE CANCER, HX OF  CAP (community acquired pneumonia) ABX  Dysuria  CKD (chronic kidney disease), stage III Stable SCr. Worsening kidney function will likely be from cardiorenal syndrome.    Tobacco abuse: quit 25 years ago   LOS: 2 days    Edin Skarda PA-C 03/01/2015 9:59 AM   

## 2015-03-02 NOTE — Progress Notes (Signed)
OT Cancellation Note  Patient Details Name: Kyle [name] MRN: [id number] DOB: 08-Mar-1932   Cancelled Treatment:    Reason Eval/Treat Not Completed: Medical issues which prohibited therapy (K+ 5.5 and pt to have cardiac cath this morning)  [name] A 03/02/2015, 8:44 AM

## 2015-03-02 NOTE — Care Management Important Message (Signed)
Important Message  Patient Details  Name: Kyle [name] MRN: [id number] Date of Birth: 1932/03/04   Medicare Important Message Given:  Yes    Jennavecia Schwier P Porter Nakama 03/02/2015, 3:20 PM

## 2015-03-02 NOTE — Interval H&P Note (Signed)
Cath Lab Visit (complete for each Cath Lab visit)  Clinical Evaluation Leading to the Procedure:   ACS: Yes.    Non-ACS:    Anginal Classification: CCS IV  Anti-ischemic medical therapy: Minimal Therapy (1 class of medications)  Non-Invasive Test Results: High-risk stress test findings: cardiac mortality >3%/year Low EF  Prior CABG: No previous CABG      History and Physical Interval Note:  03/02/2015 8:50 AM  Kyle [name]  has presented today for surgery, with the diagnosis of decrease ef  The various methods of treatment have been discussed with the patient and family. After consideration of risks, benefits and other options for treatment, the patient has consented to  Procedure(s): Left Heart Cath and Coronary Angiography (N/A) as a surgical intervention .  The patient's history has been reviewed, patient examined, no change in status, stable for surgery.  I have reviewed the patient's chart and labs.  Questions were answered to the patient's satisfaction.     VARANASI,JAYADEEP S.

## 2015-03-02 NOTE — Progress Notes (Signed)
At approx 1745 Patient states "I feel bad." Patient is short of breath with exertion, oxygen sats remain 98% on room air. Patient has increased shortness of breath when lying down and needs to sit up to breath better. IV fluids stopped at 1700, Tarri Fuller PA paged and informed, states to give patient 20mg  IV Lasix once. Patient medicated per order. Patient has has multiple bowel movements this shift and has voided multiple times. 2 liters oxygen provided for comfort. Patient states he feels better and is sitting in chair at this time with wife at bedside. Call bell is in reach. No s/s of distress or complaints voiced at this time.

## 2015-03-03 ENCOUNTER — Encounter (HOSPITAL_COMMUNITY): Payer: Self-pay | Admitting: Physician Assistant

## 2015-03-03 ENCOUNTER — Telehealth: Payer: Self-pay | Admitting: Interventional Cardiology

## 2015-03-03 DIAGNOSIS — I25119 Atherosclerotic heart disease of native coronary artery with unspecified angina pectoris: Secondary | ICD-10-CM

## 2015-03-03 DIAGNOSIS — R0600 Dyspnea, unspecified: Secondary | ICD-10-CM

## 2015-03-03 DIAGNOSIS — I472 Ventricular tachycardia: Secondary | ICD-10-CM | POA: Insufficient documentation

## 2015-03-03 DIAGNOSIS — Z955 Presence of coronary angioplasty implant and graft: Secondary | ICD-10-CM | POA: Insufficient documentation

## 2015-03-03 DIAGNOSIS — I214 Non-ST elevation (NSTEMI) myocardial infarction: Secondary | ICD-10-CM | POA: Insufficient documentation

## 2015-03-03 DIAGNOSIS — I4729 Other ventricular tachycardia: Secondary | ICD-10-CM | POA: Insufficient documentation

## 2015-03-03 DIAGNOSIS — I429 Cardiomyopathy, unspecified: Secondary | ICD-10-CM

## 2015-03-03 LAB — BASIC METABOLIC PANEL
Anion gap: 8 (ref 5–15)
BUN: 24 mg/dL — ABNORMAL HIGH (ref 6–20)
CO2: 27 mmol/L (ref 22–32)
Calcium: 9 mg/dL (ref 8.9–10.3)
Chloride: 105 mmol/L (ref 101–111)
Creatinine, Ser: 1.48 mg/dL — ABNORMAL HIGH (ref 0.61–1.24)
GFR calc Af Amer: 49 mL/min — ABNORMAL LOW (ref >60)
GFR calc non Af Amer: 43 mL/min — ABNORMAL LOW (ref >60)
Glucose, Bld: 93 mg/dL (ref 65–99)
Potassium: 3.3 mmol/L — ABNORMAL LOW (ref 3.5–5.1)
Sodium: 140 mmol/L (ref 135–145)

## 2015-03-03 LAB — CBC
HCT: 42.8 % (ref 39.0–52.0)
Hemoglobin: 13.8 g/dL (ref 13.0–17.0)
MCH: 30.2 pg (ref 26.0–34.0)
MCHC: 32.2 g/dL (ref 30.0–36.0)
MCV: 93.7 fL (ref 78.0–100.0)
Platelets: 238 10*3/uL (ref 150–400)
RBC: 4.57 MIL/uL (ref 4.22–5.81)
RDW: 15.2 % (ref 11.5–15.5)
WBC: 7.5 10*3/uL (ref 4.0–10.5)

## 2015-03-03 LAB — MAGNESIUM: Magnesium: 2 mg/dL (ref 1.7–2.4)

## 2015-03-03 LAB — HEPARIN LEVEL (UNFRACTIONATED): Heparin Unfractionated: 0.3 IU/mL (ref 0.30–0.70)

## 2015-03-03 MED ORDER — APIXABAN 2.5 MG PO TABS: 2.5000 mg | ORAL_TABLET | Freq: Two times a day (BID) | ORAL | Status: DC

## 2015-03-03 MED ORDER — POTASSIUM CHLORIDE CRYS ER 20 MEQ PO TBCR
40.0000 meq | EXTENDED_RELEASE_TABLET | Freq: Two times a day (BID) | ORAL | Status: DC
  Administered 2015-03-03: 40 meq via ORAL
  Filled 2015-03-03: qty 2

## 2015-03-03 MED ORDER — APIXABAN 5 MG PO TABS: 2.5000 mg | ORAL_TABLET | Freq: Two times a day (BID) | ORAL | Status: DC

## 2015-03-03 MED ORDER — METOPROLOL TARTRATE 37.5 MG PO TABS: 37.5000 mg | ORAL_TABLET | Freq: Two times a day (BID) | ORAL | Status: DC

## 2015-03-03 MED ORDER — LEVOFLOXACIN 750 MG PO TABS: 750.0000 mg | ORAL_TABLET | ORAL | Status: DC

## 2015-03-03 MED ORDER — LOSARTAN POTASSIUM 25 MG PO TABS: 25.0000 mg | ORAL_TABLET | Freq: Every day | ORAL | Status: DC

## 2015-03-03 MED ORDER — EZETIMIBE 10 MG PO TABS: 10.0000 mg | ORAL_TABLET | Freq: Every day | ORAL | Status: DC

## 2015-03-03 MED ORDER — POTASSIUM CHLORIDE CRYS ER 20 MEQ PO TBCR
40.0000 meq | EXTENDED_RELEASE_TABLET | Freq: Once | ORAL | Status: AC
  Administered 2015-03-03: 40 meq via ORAL
  Filled 2015-03-03: qty 2

## 2015-03-03 MED ORDER — CLOPIDOGREL BISULFATE 75 MG PO TABS: 75.0000 mg | ORAL_TABLET | Freq: Every day | ORAL | Status: DC

## 2015-03-03 MED ORDER — FUROSEMIDE 20 MG PO TABS: 20.0000 mg | ORAL_TABLET | Freq: Every day | ORAL | Status: DC

## 2015-03-03 MED ORDER — FUROSEMIDE 40 MG PO TABS
40.0000 mg | ORAL_TABLET | Freq: Every day | ORAL | Status: DC
Start: 2015-03-03 — End: 2015-03-03

## 2015-03-03 MED ORDER — APIXABAN 5 MG PO TABS
5.0000 mg | ORAL_TABLET | Freq: Two times a day (BID) | ORAL | Status: DC
  Administered 2015-03-03: 5 mg via ORAL
  Filled 2015-03-03: qty 1

## 2015-03-03 MED ORDER — SACCHAROMYCES BOULARDII 250 MG PO CAPS: 250.0000 mg | ORAL_CAPSULE | Freq: Two times a day (BID) | ORAL | Status: DC

## 2015-03-03 MED ORDER — ATORVASTATIN CALCIUM 80 MG PO TABS
80.0000 mg | ORAL_TABLET | Freq: Every day | ORAL | Status: DC
Start: 2015-03-03 — End: 2015-03-22

## 2015-03-03 MED ORDER — ATORVASTATIN CALCIUM 80 MG PO TABS: 80.0000 mg | ORAL_TABLET | Freq: Every day | ORAL | Status: DC

## 2015-03-03 NOTE — Progress Notes (Signed)
Pt had 13 beats v-tach, Pt denied SOB, CP.  Vital signs stable as charted, [facility] PA K[place]ahan informed, new orders received.  Will continue to monitor patient.

## 2015-03-03 NOTE — Progress Notes (Signed)
ANTICOAGULATION CONSULT NOTE - Follow Up Consult  Pharmacy Consult for Heparin  Indication: atrial fibrillation  Allergies  Allergen Reactions  . Simvastatin     REACTION: myalgias    Patient Measurements: Height: 6' (182.9 cm) Weight: 194 lb 0.1 oz (88 kg) IBW/kg (Calculated) : 77.6  Vital Signs: Temp: 97.5 F (36.4 C) (11/11 0241) Temp Source: Oral (11/11 0241) BP: 137/77 mmHg (11/11 0241) Pulse Rate: 70 (11/11 0241)  Labs:  Recent Labs  03/01/15 0327 03/01/15 1214 03/02/15 0420 03/02/15 0427 03/02/15 1155 03/03/15 0410  HGB 15.8  --   --  15.0 14.4 13.8  HCT 47.4  --   --  47.0 43.1 42.8  PLT 228  --   --  257 237 238  LABPROT  --   --  15.5*  --   --   --   INR  --   --  1.21  --   --   --   HEPARINUNFRC 0.75* 0.50 0.70  --   --  0.30  CREATININE 1.71*  --   --  1.87* 1.54*  --     Estimated Creatinine Clearance: 41.3 mL/min (by C-G formula based on Cr of 1.54).  Assessment: Therapeutic heparin level x 1 after re-start s/p cath   Goal of Therapy:  Heparin level 0.3-0.7 units/ml Monitor platelets by anticoagulation protocol: Yes   Plan:  -Continue heparin at 1050 units/hr -1300 HL  Ahlana Slaydon 03/03/2015,6:54 AM

## 2015-03-03 NOTE — Telephone Encounter (Signed)
TOC  11/18 @ 10a with Ellen Henri   Per Katie TOC

## 2015-03-03 NOTE — Discharge Instructions (Addendum)
Information on my medicine - ELIQUIS (apixaban)  This medication education was reviewed with me or my healthcare representative as part of my discharge preparation.  The pharmacist that spoke with me during my hospital stay was:  Pat Patrick, [facility]  Why was Eliquis prescribed for you? Eliquis was prescribed for you to reduce the risk of a blood clot forming that can cause a stroke if you have a medical condition called atrial fibrillation (a type of irregular heartbeat).  What do You need to know about Eliquis ? Take your Eliquis TWICE DAILY - one tablet in the morning and one tablet in the evening with or without food. If you have difficulty swallowing the tablet whole please discuss with your pharmacist how to take the medication safely.  Take Eliquis exactly as prescribed by your doctor and DO NOT stop taking Eliquis without talking to the doctor who prescribed the medication.  Stopping may increase your risk of developing a stroke.  Refill your prescription before you run out.  After discharge, you should have regular check-up appointments with your healthcare provider that is prescribing your Eliquis.  In the future your dose may need to be changed if your kidney function or weight changes by a significant amount or as you get older.  What do you do if you miss a dose? If you miss a dose, take it as soon as you remember on the same day and resume taking twice daily.  Do not take more than one dose of ELIQUIS at the same time to make up a missed dose.  Important Safety Information A possible side effect of Eliquis is bleeding. You should call your healthcare provider right away if you experience any of the following: ? Bleeding from an injury or your nose that does not stop. ? Unusual colored urine (red or dark brown) or unusual colored stools (red or black). ? Unusual bruising for unknown reasons. ? A serious fall or if you hit your head (even if there is no bleeding).  Some  medicines may interact with Eliquis and might increase your risk of bleeding or clotting while on Eliquis. To help avoid this, consult your healthcare provider or pharmacist prior to using any new prescription or non-prescription medications, including herbals, vitamins, non-steroidal anti-inflammatory drugs (NSAIDs) and supplements.  This website has more information on Eliquis (apixaban): http://www.eliquis.com/eliquis/home    MEDICINE TO STOP TAKING:  YOU MUST STOP TAKING HYZAAR (LOSARTIN-HYDROCHLOROTHIAZIDE)

## 2015-03-03 NOTE — Progress Notes (Addendum)
Patient Name: Kyle [name] Date of Encounter: 03/03/2015     Principal Problem:   Acute systolic CHF (congestive heart failure), NYHA class 4 ([place]) Active Problems:   Hyperlipemia   Essential hypertension, benign   PROSTATE CANCER, HX OF   CAP (community acquired pneumonia)   Dysuria   Elevated troponin   CKD (chronic kidney disease), stage III   Tobacco abuse   Acute heart failure (HCC)   Cardiomyopathy (HCC)   Paroxysmal atrial fibrillation (HCC)   Acute systolic heart failure (HCC)   Coronary artery disease involving native coronary artery of native heart with angina pectoris ([place])   Acute congestive heart failure (HCC)   AKI (acute kidney injury) ([place])   Hyperkalemia    SUBJECTIVE  Very eager to leave. No CP or SOB. Feeling well.   CURRENT MEDS . aspirin  81 mg Oral Daily  . cholecalciferol  1,000 Units Oral Daily  . clopidogrel  75 mg Oral Daily  . ezetimibe  10 mg Oral Daily  . guaiFENesin  1,200 mg Oral BID  . levofloxacin  750 mg Oral Q48H  . metoprolol tartrate  37.5 mg Oral BID  . omega-3 acid ethyl esters  1 g Oral Daily  . saccharomyces boulardii  250 mg Oral BID  . sodium chloride  3 mL Intravenous Q12H  . sodium chloride  3 mL Intravenous Q12H  . traMADol  50 mg Oral TID  . vitamin B-12  50 mcg Oral Daily    OBJECTIVE  Filed Vitals:   03/02/15 2125 03/03/15 0020 03/03/15 0240 03/03/15 0241  BP: 130/99 137/91 137/77 137/77  Pulse:  47 65 70  Temp:    97.5 F (36.4 C)  TempSrc:    Oral  Resp:  21 14 18   Height:      Weight:    194 lb 0.1 oz (88 kg)  SpO2:  95% 96% 95%    Intake/Output Summary (Last 24 hours) at 03/03/15 0732 Last data filed at 03/03/15 0544  Gross per 24 hour  Intake  782.5 ml  Output   1875 ml  Net -1092.5 ml   Filed Weights   03/01/15 0515 03/02/15 0600 03/03/15 0241  Weight: 190 lb 9.6 oz (86.456 kg) 192 lb 11.2 oz (87.408 kg) 194 lb 0.1 oz (88 kg)    PHYSICAL EXAM  General: Pleasant, NAD. Neuro: Alert  and oriented X 3. Moves all extremities spontaneously. Psych: Normal affect. HEENT:  Normal  Neck: Supple without bruits or JVD. Lungs:  Resp regular and unlabored, CTA. Heart: RRR no s3, s4, or murmurs. Abdomen: Soft, non-tender, non-distended, BS + x 4.  Extremities: No clubbing, cyanosis or edema. DP/PT/Radials 2+ and equal bilaterally. Radial site stable  Accessory Clinical Findings  CBC  Recent Labs  03/02/15 1155 03/03/15 0410  WBC 8.3 7.5  HGB 14.4 13.8  HCT 43.1 42.8  MCV 92.9 93.7  PLT 237 [id number]   Basic Metabolic Panel  Recent Labs  03/02/15 0427 03/02/15 1155 03/03/15 0410  NA 136  --  140  K 5.5*  --  3.3*  CL 99*  --  105  CO2 26  --  27  GLUCOSE 116*  --  93  BUN 33*  --  24*  CREATININE 1.87* 1.54* 1.48*  CALCIUM 9.8  --  9.0  MG  --   --  2.0   Fasting Lipid Panel  Recent Labs  03/01/15 0327  CHOL 194  HDL 53  LDLCALC 128*  TRIG 63  CHOLHDL 3.7    TELE  Sinus with freq PVCs and runs of NSVT. 12 Vtac  Radiology/Studies  Dg Chest 2 View  03/01/2015  CLINICAL DATA:  Heart failure subsequent encounter, scheduled for heart catheterization tomorrow EXAM: CHEST  2 VIEW COMPARISON:  02/27/2015 FINDINGS: Limited inspiratory effect with mild elevation of the right diaphragm. Moderate cardiac enlargement with vascular congestion similar to prior study. No evidence of edema consolidation or effusion. IMPRESSION: Chronic hypoventilatory change. Cardiac enlargement. No acute findings. Electronically Signed   By: Skipper Cliche M.D.   On: 03/01/2015 11:53   US Renal  02/28/2015  CLINICAL DATA:  Chronic kidney disease. EXAM: RENAL / URINARY TRACT ULTRASOUND COMPLETE COMPARISON:  CT 03/11/2011 FINDINGS: Right Kidney: Length: 10.7 cm. Echogenicity within normal limits. No mass or hydronephrosis visualized. Left Kidney: Length: 10.4 cm. Echogenicity within normal limits. No mass or hydronephrosis visualized. Bladder: Appears normal for degree of bladder  distention. IMPRESSION: No acute findings. Electronically Signed   By: Rolm Baptise M.D.   On: 02/28/2015 15:40   Nm Pulmonary Perf And Vent  02/27/2015  CLINICAL DATA:  Dyspnea for a few days. History of CHF. No chest pain. EXAM: NUCLEAR MEDICINE VENTILATION - PERFUSION LUNG SCAN TECHNIQUE: Ventilation images were obtained in multiple projections using inhaled aerosol Tc-61m DTPA. Perfusion images were obtained in multiple projections after intravenous injection of Tc-37m MAA. RADIOPHARMACEUTICALS:  [id number] millicuries of AB-123456789 DTPA aerosol inhalation and 3.3 millicuries of [id number] MAA IV COMPARISON:  Current chest radiograph FINDINGS: Ventilation: No focal ventilation defect. Perfusion: No wedge shaped peripheral perfusion defects to suggest acute pulmonary embolism. IMPRESSION: Unremarkable lung ventilation perfusion study. No evidence of pulmonary thromboembolism. Electronically Signed   By: Lajean Manes M.D.   On: 02/27/2015 12:43   Dg Chest Portable 1 View  02/27/2015  CLINICAL DATA:  Shortness of breath for 2 days EXAM: PORTABLE CHEST 1 VIEW COMPARISON:  06/20/2014 FINDINGS: Mild cardiomegaly with vascular pedicle widening. Stable aortic contours. Borderline pulmonary venous congestion. Chronic hypoventilation with indistinct basilar opacities. No asymmetric consolidation. No pneumothorax. IMPRESSION: Chronic hypoventilation with bibasilar atelectasis or scarring. Superimposed pneumonia could be obscured. Electronically Signed   By: Monte Fantasia M.D.   On: 02/27/2015 06:34    ASSESSMENT AND PLAN Kyle [name] is an 81 y.o. male with past medical history of CKD, HTN, HLD, prostate cancer and stroke that presented 02/27/15 in for progressing worsening dyspnea, orthopnea and nonproductive cough.  CAD/NSTEMI -- Troponin 0.18--> 0.17--> 0.18 -- S/p LHC on 03/02/15 with mLAD lesion ~75% stenosed s/p PCI/DES. Diffuse disease in the L PDA and dLCx and 80% lesion in non dominant RCA.  Normal LVEDP. -- Continue DAPT w/ ASA/Plavix. He also has newly diagnosed PAF. He will require triple therapy for 1 month with ASA, plavix and Eliquis. He will drop the ASA in 1 month (04/02/15). Continue Lopressor 37.5mg  BID. He is not currently on a statin. I will add atorvastatin 80mg .   Acute systolic heart failure/newly diagnosed cardiomyopathy: -- EF 15-20%, mild LVH, mild MR, mild systolic RV dysfunction and mod TR.  -- S/p LHC with CAD- likely ischemic CM.  -- He has frequent ventricular ectopy on telemetry, NSVT and 12 beat run of Vtac. He will require a LifeVest at discharge. Repeat ECHO in 90 days to re-evaluate LV function and possible need for ICD placement -- He took losartan-HCTZ at home. We will discontinue this. He should resume losartan but not HCTZ, but this can be resumed at outpatient appointment  in 1 week ( stopped due to AKI) -- He appears euvolemic today. LVEDP normal on cath yesterday. He will require maintenance diuretics. I will start lasix 40mg  po qd.   New onset atrial fibrillation: currently back in sinus -- CHADSVASC of at least 7 ( CHF 1, HTN 1, Age 52, CVA 2, CAD 1)  -- Continue metoprolol. Needs to be on oral anticoagulation. Will start Eliquis 2.5mg  BID. He is >80, creat < 1.5 (very close 1.48), >60 kg. He wil be on triple therapy with ASA, plavix and Eliquis for 1 month and then we will stop the ASA. If his kidney function improves, we can increase him to the 5mg  dosing in 1 month when he drops the ASA.   Essential hypertension: -- Continue lopressor 37.5mg  BID. Losartan was held due to AKI. This can be added back in 1 week at post hospital TOC appt.  AKI on Stage III chronic kidney disease: -- Renal ultrasound was performed and show no hydronephrosis. Creat trending downwards, 1.48 today.   Hyperkalemia--> Hypokalemia -- Given kayexalate. Now K 3.3. Will give Kdur 78mEq  HLD- LDL 128. Goal <70. I have added atorva 80mg  as above  Signed, Eileen Stanford PA-C  Pager [phone]

## 2015-03-03 NOTE — Discharge Summary (Signed)
Physician Discharge Summary  Kyle [name] [id number] DOB: 1931/12/08 DOA: 02/27/2015  PCP: Viviana Simpler, MD  Admit date: 02/27/2015 Discharge date: 03/03/2015  Time spent: 35 minutes  Recommendations for Outpatient Follow-up:  1. Cardiology in 2 weeks. Will need a repeat echo in 90 days. 2. I'll have to adjust his anticoagulation after 30 days.  Discharge Diagnoses:  Principal Problem:   Acute systolic CHF (congestive heart failure), NYHA class 4 (HCC) Active Problems:   Hyperlipemia   Essential hypertension, benign   PROSTATE CANCER, HX OF   CAP (community acquired pneumonia)   Dysuria   Elevated troponin   CKD (chronic kidney disease), stage III   Tobacco abuse   Acute heart failure (HCC)   Cardiomyopathy (HCC)   Paroxysmal atrial fibrillation (HCC)   Acute systolic heart failure (HCC)   Coronary artery disease involving native coronary artery of native heart with angina pectoris (HCC)   Acute congestive heart failure (HCC)   AKI (acute kidney injury) (HCC)   Hyperkalemia   Dyspnea   Non-STEMI (non-ST elevated myocardial infarction) (HCC)   Stented coronary artery   NSVT (nonsustained ventricular tachycardia) ([place])   Discharge Condition: Guarded  Diet recommendation: low sodium fluid restricted   Filed Weights   03/01/15 0515 03/02/15 0600 03/03/15 0241  Weight: 86.456 kg (190 lb 9.6 oz) 87.408 kg (192 lb 11.2 oz) 88 kg (194 lb 0.1 oz)    History of present illness:  81 year old with past medical history of chronic kidney disease hypertension comes in for orthopnea and shortness of breath with subjective fever chills and productive cough that started several days prior to admission.  Hospital Course:  Acute systolic heart failure/newly diagnosed cardiomyopathy: He was started on IV Lasix is 2-D echo was done that showed an ejection fraction less than 30%, cardiology was consulted who recommended to continue IV diuresis titrate his beta blocker up. And also  recommended a cardiac cath which was done on 03/02/2015 mid LAD lesion 75% stenosed with a PCI done, and diffuse disease in the left PDA with an 80% lesion in the proximal nondominant RCA. Cardiology also recommended to continue Plavix aspirin and request for a month, then DC aspirin and continue Plavix and Eluquis and probably see if he qualifies for an increase in his Eluquis. We'll continue his ARB.  New onset atrial fibrillation: Rate control metoprolol CHADSVASC 5, continue Eluquis. We'll have to reevaluate after coming off of aspirin if Eluquis [place] to be adjusted.  Elevated troponins: In the setting of A. fib with RVR, see above for further details.  Essential hypertension: Continue metoprolol, Lasix and ARB.  Acute on chronic kidney disease stage III: He developed mild acute renal failure does diuresis, his Lasix was held. Along with his ARB. Resume these as an outpatient.  Hyperkalemia: Likely due to prerenal azotemia, he was given Kayexalate his potassium came down.   Procedures:  Cardiac cath 2-D echo  Consultations:  Cardiology  Discharge Exam: Filed Vitals:   03/03/15 0756  BP: 122/80  Pulse: 76  Temp: 97.7 F (36.5 C)  Resp: 18    General: Awake alert and oriented 3 Cardiovascular: Regular rate and rhythm Respiratory: Good air movement clear to auscultation  Discharge Instructions   Discharge Instructions    AMB Referral to Cardiac Rehabilitation - Phase II    Complete by:  As directed   Diagnosis:  PCI     Amb Referral to Cardiac Rehabilitation    Complete by:  As directed   Diagnosis:  PCI Heart Failure (see criteria below)       Diet - low sodium heart healthy    Complete by:  As directed      Increase activity slowly    Complete by:  As directed           Current Discharge Medication List    START taking these medications   Details  apixaban (ELIQUIS) 2.5 MG TABS tablet Take 1 tablet (2.5 mg total) by mouth 2 (two) times  daily. Qty: 60 tablet, Refills: 0    atorvastatin (LIPITOR) 80 MG tablet Take 1 tablet (80 mg total) by mouth daily at 6 PM. Qty: 30 tablet, Refills: 0    clopidogrel (PLAVIX) 75 MG tablet Take 1 tablet (75 mg total) by mouth daily. Qty: 30 tablet, Refills: 0    ezetimibe (ZETIA) 10 MG tablet Take 1 tablet (10 mg total) by mouth daily. Qty: 30 tablet, Refills: 0    furosemide (LASIX) 20 MG tablet Take 1 tablet (20 mg total) by mouth daily. Qty: 30 tablet, Refills: 0    levofloxacin (LEVAQUIN) 750 MG tablet Take 1 tablet (750 mg total) by mouth every other day. Qty: 4 tablet, Refills: 0    Metoprolol Tartrate 37.5 MG TABS Take 37.5 mg by mouth 2 (two) times daily. Qty: 60 tablet, Refills: 3    saccharomyces boulardii (FLORASTOR) 250 MG capsule Take 1 capsule (250 mg total) by mouth 2 (two) times daily. Qty: 30 capsule, Refills: 0      CONTINUE these medications which have CHANGED   Details  losartan (COZAAR) 25 MG tablet Take 1 tablet (25 mg total) by mouth daily. Qty: 90 tablet, Refills: 3      CONTINUE these medications which have NOT CHANGED   Details  aspirin 81 MG tablet Take 81 mg by mouth daily.      Cholecalciferol (VITAMIN D) 1000 UNITS capsule Take 1,000 Units by mouth daily.      fish oil-omega-3 fatty acids 1000 MG capsule Take 1 g by mouth daily.    Red Yeast Rice 600 MG CAPS Take 1 capsule by mouth daily.     traMADol (ULTRAM) 50 MG tablet TAKE 1 TABLET BY MOUTH 3 TIMES A DAY Qty: 90 tablet, Refills: 0    vitamin B-12 (CYANOCOBALAMIN) 50 MCG tablet Take 50 mcg by mouth daily.      STOP taking these medications     losartan-hydrochlorothiazide (HYZAAR) 50-12.5 MG per tablet        Allergies  Allergen Reactions  . Simvastatin     REACTION: myalgias   Follow-up Information    Follow up with [facility].   Why:  They will do your home health care at your home   Contact information:   [address] [place]  [address] [phone]       Follow up with SIMMONS, BRITTAINY, PA-C.   Specialties:  Cardiology, Radiology   Why:  @ 10am    Contact information:   [place] [address] [phone]        The results of significant diagnostics from this hospitalization (including imaging, microbiology, ancillary and laboratory) are listed below for reference.    Significant Diagnostic Studies: Dg Chest 2 View  03/01/2015  CLINICAL DATA:  Heart failure subsequent encounter, scheduled for heart catheterization tomorrow EXAM: CHEST  2 VIEW COMPARISON:  02/27/2015 FINDINGS: Limited inspiratory effect with mild elevation of the right diaphragm. Moderate cardiac enlargement with vascular  congestion similar to prior study. No evidence of edema consolidation or effusion. IMPRESSION: Chronic hypoventilatory change. Cardiac enlargement. No acute findings. Electronically Signed   By: Skipper Cliche M.D.   On: 03/01/2015 11:53   US Renal  02/28/2015  CLINICAL DATA:  Chronic kidney disease. EXAM: RENAL / URINARY TRACT ULTRASOUND COMPLETE COMPARISON:  CT 03/11/2011 FINDINGS: Right Kidney: Length: 10.7 cm. Echogenicity within normal limits. No mass or hydronephrosis visualized. Left Kidney: Length: 10.4 cm. Echogenicity within normal limits. No mass or hydronephrosis visualized. Bladder: Appears normal for degree of bladder distention. IMPRESSION: No acute findings. Electronically Signed   By: Rolm Baptise M.D.   On: 02/28/2015 15:40   Nm Pulmonary Perf And Vent  02/27/2015  CLINICAL DATA:  Dyspnea for a few days. History of CHF. No chest pain. EXAM: NUCLEAR MEDICINE VENTILATION - PERFUSION LUNG SCAN TECHNIQUE: Ventilation images were obtained in multiple projections using inhaled aerosol Tc-81m DTPA. Perfusion images were obtained in multiple projections after intravenous injection of Tc-4m MAA. RADIOPHARMACEUTICALS:  [id number] millicuries of AB-123456789 DTPA aerosol inhalation and 3.3 millicuries  of [id number] MAA IV COMPARISON:  Current chest radiograph FINDINGS: Ventilation: No focal ventilation defect. Perfusion: No wedge shaped peripheral perfusion defects to suggest acute pulmonary embolism. IMPRESSION: Unremarkable lung ventilation perfusion study. No evidence of pulmonary thromboembolism. Electronically Signed   By: Lajean Manes M.D.   On: 02/27/2015 12:43   Dg Chest Portable 1 View  02/27/2015  CLINICAL DATA:  Shortness of breath for 2 days EXAM: PORTABLE CHEST 1 VIEW COMPARISON:  06/20/2014 FINDINGS: Mild cardiomegaly with vascular pedicle widening. Stable aortic contours. Borderline pulmonary venous congestion. Chronic hypoventilation with indistinct basilar opacities. No asymmetric consolidation. No pneumothorax. IMPRESSION: Chronic hypoventilation with bibasilar atelectasis or scarring. Superimposed pneumonia could be obscured. Electronically Signed   By: Monte Fantasia M.D.   On: 02/27/2015 06:34    Microbiology: Recent Results (from the past 240 hour(s))  Urine culture     Status: None   Collection Time: 02/27/15  8:14 AM  Result Value Ref Range Status   Specimen Description URINE, CLEAN CATCH  Final   Special Requests NONE  Final   Culture MULTIPLE SPECIES PRESENT, SUGGEST RECOLLECTION  Final   Report Status 02/28/2015 FINAL  Final  Culture, blood (routine x 2) Call MD if unable to obtain prior to antibiotics being given     Status: None (Preliminary result)   Collection Time: 02/27/15 10:40 AM  Result Value Ref Range Status   Specimen Description BLOOD LEFT ARM  Final   Special Requests BOTTLES DRAWN AEROBIC ONLY 10CC  Final   Culture NO GROWTH 3 DAYS  Final   Report Status PENDING  Incomplete  Culture, blood (routine x 2) Call MD if unable to obtain prior to antibiotics being given     Status: None (Preliminary result)   Collection Time: 02/27/15 10:45 AM  Result Value Ref Range Status   Specimen Description BLOOD LEFT HAND  Final   Special Requests BOTTLES  DRAWN AEROBIC AND ANAEROBIC 5CC  Final   Culture NO GROWTH 3 DAYS  Final   Report Status PENDING  Incomplete     Labs: Basic Metabolic Panel:  Recent Labs Lab 02/27/15 0602 02/28/15 0149 03/01/15 0327 03/02/15 0427 03/02/15 1155 03/03/15 0410  NA 140 140 139 136  --  140  K 3.9 4.1 4.3 5.5*  --  3.3*  CL 106 104 102 99*  --  105  CO2 24 28 25 26   --  27  GLUCOSE  100* 108* 121* 116*  --  93  BUN 14 18 27* 33*  --  24*  CREATININE 1.54* 1.63* 1.71* 1.87* 1.54* 1.48*  CALCIUM 9.1 9.5 9.9 9.8  --  9.0  MG  --   --   --   --   --  2.0   Liver Function Tests: No results for input(s): AST, ALT, ALKPHOS, BILITOT, PROT, ALBUMIN in the last 168 hours. No results for input(s): LIPASE, AMYLASE in the last 168 hours. No results for input(s): AMMONIA in the last 168 hours. CBC:  Recent Labs Lab 02/27/15 0602 03/01/15 0327 03/02/15 0427 03/02/15 1155 03/03/15 0410  WBC 8.7 9.0 10.7* 8.3 7.5  NEUTROABS 5.1  --   --   --   --   HGB 14.3 15.8 15.0 14.4 13.8  HCT 44.9 47.4 47.0 43.1 42.8  MCV 94.5 93.5 94.8 92.9 93.7  PLT 222 228 257 237 238   Cardiac Enzymes:  Recent Labs Lab 02/27/15 0602 02/27/15 1040 02/27/15 1651  TROPONINI 0.18* 0.17* 0.18*   BNP: BNP (last 3 results)  Recent Labs  02/27/15 0603  BNP 1011.3*    ProBNP (last 3 results) No results for input(s): PROBNP in the last 8760 hours.  CBG:  Recent Labs Lab 03/01/15 1156 03/01/15 1618  GLUCAP 109* 107*       Signed:  Charlynne Cousins  [facility] 03/03/2015, 1:08 PM

## 2015-03-03 NOTE — Progress Notes (Signed)
Occupational Therapy Treatment Patient Details Name: Kyle [name] MRN: [id number] DOB: Sep 08, 1931 Today's Date: 03/03/2015    History of present illness 81 y.o. male with chronic kidney disease, hypertension, hyperlipidemia and pneumonia 1/16. Admitted with dyspnea and generalized weakness. Underwent cardiac cath 03/02/15.   OT comments  Pt and wife instructed in energy conservation strategies and safety with showering. Pt performing at a supervision level in ADL and mobility with his cane.  Eager to go home with wife's assist.  Follow Up Recommendations  Home health OT;Supervision - Intermittent    Equipment Recommendations       Recommendations for Other Services      Precautions / Restrictions Precautions Precautions: Fall Restrictions Weight Bearing Restrictions: No       Mobility Bed Mobility               General bed mobility comments: pt sitting EOB upon arrival  Transfers Overall transfer level: Needs assistance Equipment used: Straight cane Transfers: Sit to/from Stand Sit to Stand: Supervision         General transfer comment: encouraged to stand momentarily, prior to walking    Balance     Sitting balance-Leahy Scale: Good       Standing balance-Leahy Scale: Fair                     ADL Overall ADL's : Needs assistance/impaired     Grooming: Wash/dry hands;Standing;Supervision/safety Grooming Details (indicate cue type and reason): instructed pt on benefits of sitting to shave for energy conservation     Lower Body Bathing: Supervison/ safety;Sit to/from stand Lower Body Bathing Details (indicate cue type and reason): recommended pt consider long bath sponge/brush Upper Body Dressing : Set up;Sitting Upper Body Dressing Details (indicate cue type and reason): pt wanting tshirt instead of gown Lower Body Dressing: Supervision/safety;Sit to/from stand Lower Body Dressing Details (indicate cue type and reason): pt instructed to  cross foot over opposite knee to  donn socks, donned slip on shoes Toilet Transfer: Supervision/safety;Ambulation (with cane)   Toileting- Clothing Manipulation and Hygiene: Supervision/safety;Sit to/from stand       Functional mobility during ADLs: Supervision/safety;Cane General ADL Comments: Provided handout and educated pt and wife extensively in energy conservation.  Per wife, their son has concerns about pt using tub for showering.  Instructed in availability of tub transfer bench and that it will require removal of glass shower doors. Recommended hand held shower door.  If pt is resistant to removal of shower doors, recommended height adjustable shower seat with back and grab bars with supervision for safety. Pt and      Vision                     Perception     Praxis      Cognition   Behavior During Therapy: [facility] for tasks assessed/performed;Flat affect Overall Cognitive Status: Within Functional Limits for tasks assessed                       Extremity/Trunk Assessment               Exercises     Shoulder Instructions       General Comments      Pertinent Vitals/ Pain       Pain Assessment: No/denies pain  Home Living  Prior Functioning/Environment              Frequency Min 2X/week     Progress Toward Goals  OT Goals(current goals can now be found in the care plan section)  Progress towards OT goals: Progressing toward goals  Acute Rehab OT Goals Patient Stated Goal: to go home  Plan Discharge plan remains appropriate    Co-evaluation                 End of Session Equipment Utilized During Treatment:  [facility])   Activity Tolerance Patient tolerated treatment well   Patient Left in chair;with call bell/phone within reach;with family/visitor present   Nurse Communication          Time: 0950-1006 OT Time Calculation (min): 16 min  Charges: OT General  Charges $OT Visit: 1 Procedure OT Treatments $Self Care/Home Management : 8-22 mins  Malka So 03/03/2015, 10:50 AM  [phone]

## 2015-03-03 NOTE — Progress Notes (Signed)
CARDIAC REHAB PHASE I   PRE:  Rate/Rhythm: 19 SR freq PVCs  BP:  Supine:   Sitting: 122/80  Standing:    SaO2:   MODE:  Ambulation: 200 ft   POST:  Rate/Rhythm: 82 SR PVCs  BP:  Supine:   Sitting: 160/70  Standing:    SaO2:  0740-0850 Pt walked 200 ft with his cane and little assistance. Tired by end of walk. No CP. Having lots of ectopy. To sitting on side of bed after walk. Lots of education completed with pt and wife who voiced understanding. Gave CHF booklet and reviewed importance of daily weights, 2L fluid restriction, and 2000 mg sodium restriction. Gave smoking cessation handout but pt stated he would not have any problem giving up his smokeless tobacco. Gave Off the Beat booklet and discussed with pt why he will be on blood thinner for this. Discussed CRP 2 and will refer to [facility] program. Stressed importance of plavix with stent. Gave modified ex ed and encouraged pt to walk as tolerated. Pt really wants to go home. Awaiting life vest.   Graylon Good, RN BSN  03/03/2015 8:44 AM

## 2015-03-04 LAB — CULTURE, BLOOD (ROUTINE X 2)
Culture: NO GROWTH
Culture: NO GROWTH

## 2015-03-05 DIAGNOSIS — I129 Hypertensive chronic kidney disease with stage 1 through stage 4 chronic kidney disease, or unspecified chronic kidney disease: Secondary | ICD-10-CM | POA: Diagnosis not present

## 2015-03-05 DIAGNOSIS — E785 Hyperlipidemia, unspecified: Secondary | ICD-10-CM | POA: Diagnosis not present

## 2015-03-05 DIAGNOSIS — Z7982 Long term (current) use of aspirin: Secondary | ICD-10-CM | POA: Diagnosis not present

## 2015-03-05 DIAGNOSIS — K219 Gastro-esophageal reflux disease without esophagitis: Secondary | ICD-10-CM | POA: Diagnosis not present

## 2015-03-05 DIAGNOSIS — I4891 Unspecified atrial fibrillation: Secondary | ICD-10-CM | POA: Diagnosis not present

## 2015-03-05 DIAGNOSIS — Z8546 Personal history of malignant neoplasm of prostate: Secondary | ICD-10-CM | POA: Diagnosis not present

## 2015-03-05 DIAGNOSIS — N183 Chronic kidney disease, stage 3 (moderate): Secondary | ICD-10-CM | POA: Diagnosis not present

## 2015-03-05 DIAGNOSIS — I509 Heart failure, unspecified: Secondary | ICD-10-CM | POA: Diagnosis not present

## 2015-03-06 ENCOUNTER — Telehealth: Payer: Self-pay

## 2015-03-06 DIAGNOSIS — N183 Chronic kidney disease, stage 3 (moderate): Secondary | ICD-10-CM | POA: Diagnosis not present

## 2015-03-06 DIAGNOSIS — E785 Hyperlipidemia, unspecified: Secondary | ICD-10-CM | POA: Diagnosis not present

## 2015-03-06 DIAGNOSIS — I129 Hypertensive chronic kidney disease with stage 1 through stage 4 chronic kidney disease, or unspecified chronic kidney disease: Secondary | ICD-10-CM | POA: Diagnosis not present

## 2015-03-06 DIAGNOSIS — I4891 Unspecified atrial fibrillation: Secondary | ICD-10-CM | POA: Diagnosis not present

## 2015-03-06 DIAGNOSIS — I509 Heart failure, unspecified: Secondary | ICD-10-CM | POA: Diagnosis not present

## 2015-03-06 DIAGNOSIS — K219 Gastro-esophageal reflux disease without esophagitis: Secondary | ICD-10-CM | POA: Diagnosis not present

## 2015-03-06 NOTE — Telephone Encounter (Signed)
Chris physical therapist left v/m; pt is new to metoprolol; today pts resting heart rate was 47-58. BP was lower than normal. Gerald Stabs said leaving this as FYI for PCP and will continue to monitor this week. FYI only. Pt lst seen [facility] 05/23/2014.

## 2015-03-06 NOTE — Telephone Encounter (Signed)
Just in hospital Should have follow up this week ---here or cardiology. If no symptoms, that heart rate is okay

## 2015-03-06 NOTE — Telephone Encounter (Signed)
Patient contacted regarding discharge from [facility] on 03/03/15.  Patient understands to follow up with provider Ellen Henri on 03/10/15 at 10 AM at [facility]. Patient understands discharge instructions? yes Patient understands medications and regiment? yes Patient understands to bring all medications to this visit? yes

## 2015-03-07 ENCOUNTER — Encounter: Payer: Self-pay | Admitting: *Deleted

## 2015-03-07 DIAGNOSIS — I129 Hypertensive chronic kidney disease with stage 1 through stage 4 chronic kidney disease, or unspecified chronic kidney disease: Secondary | ICD-10-CM | POA: Diagnosis not present

## 2015-03-07 DIAGNOSIS — K219 Gastro-esophageal reflux disease without esophagitis: Secondary | ICD-10-CM | POA: Diagnosis not present

## 2015-03-07 DIAGNOSIS — I4891 Unspecified atrial fibrillation: Secondary | ICD-10-CM | POA: Diagnosis not present

## 2015-03-07 DIAGNOSIS — E785 Hyperlipidemia, unspecified: Secondary | ICD-10-CM | POA: Diagnosis not present

## 2015-03-07 DIAGNOSIS — I509 Heart failure, unspecified: Secondary | ICD-10-CM | POA: Diagnosis not present

## 2015-03-07 DIAGNOSIS — N183 Chronic kidney disease, stage 3 (moderate): Secondary | ICD-10-CM | POA: Diagnosis not present

## 2015-03-08 DIAGNOSIS — K219 Gastro-esophageal reflux disease without esophagitis: Secondary | ICD-10-CM | POA: Diagnosis not present

## 2015-03-08 DIAGNOSIS — I4891 Unspecified atrial fibrillation: Secondary | ICD-10-CM | POA: Diagnosis not present

## 2015-03-08 DIAGNOSIS — I129 Hypertensive chronic kidney disease with stage 1 through stage 4 chronic kidney disease, or unspecified chronic kidney disease: Secondary | ICD-10-CM | POA: Diagnosis not present

## 2015-03-08 DIAGNOSIS — E785 Hyperlipidemia, unspecified: Secondary | ICD-10-CM | POA: Diagnosis not present

## 2015-03-08 DIAGNOSIS — N183 Chronic kidney disease, stage 3 (moderate): Secondary | ICD-10-CM | POA: Diagnosis not present

## 2015-03-08 DIAGNOSIS — I509 Heart failure, unspecified: Secondary | ICD-10-CM | POA: Diagnosis not present

## 2015-03-09 DIAGNOSIS — K219 Gastro-esophageal reflux disease without esophagitis: Secondary | ICD-10-CM | POA: Diagnosis not present

## 2015-03-09 DIAGNOSIS — I509 Heart failure, unspecified: Secondary | ICD-10-CM | POA: Diagnosis not present

## 2015-03-09 DIAGNOSIS — E785 Hyperlipidemia, unspecified: Secondary | ICD-10-CM | POA: Diagnosis not present

## 2015-03-09 DIAGNOSIS — I4891 Unspecified atrial fibrillation: Secondary | ICD-10-CM | POA: Diagnosis not present

## 2015-03-09 DIAGNOSIS — I129 Hypertensive chronic kidney disease with stage 1 through stage 4 chronic kidney disease, or unspecified chronic kidney disease: Secondary | ICD-10-CM | POA: Diagnosis not present

## 2015-03-09 DIAGNOSIS — N183 Chronic kidney disease, stage 3 (moderate): Secondary | ICD-10-CM | POA: Diagnosis not present

## 2015-03-10 ENCOUNTER — Ambulatory Visit (INDEPENDENT_AMBULATORY_CARE_PROVIDER_SITE_OTHER): Payer: Medicare Other | Admitting: Cardiology

## 2015-03-10 ENCOUNTER — Encounter: Payer: Self-pay | Admitting: Cardiology

## 2015-03-10 VITALS — BP 130/90 | HR 58 | Wt 195.8 lb

## 2015-03-10 DIAGNOSIS — I25119 Atherosclerotic heart disease of native coronary artery with unspecified angina pectoris: Secondary | ICD-10-CM | POA: Diagnosis not present

## 2015-03-10 DIAGNOSIS — I10 Essential (primary) hypertension: Secondary | ICD-10-CM

## 2015-03-10 DIAGNOSIS — I480 Paroxysmal atrial fibrillation: Secondary | ICD-10-CM | POA: Diagnosis not present

## 2015-03-10 DIAGNOSIS — I5021 Acute systolic (congestive) heart failure: Secondary | ICD-10-CM | POA: Diagnosis not present

## 2015-03-10 MED ORDER — METOPROLOL TARTRATE 25 MG PO TABS: 12.5000 mg | ORAL_TABLET | Freq: Two times a day (BID) | ORAL | Status: DC

## 2015-03-10 MED ORDER — LOSARTAN POTASSIUM 25 MG PO TABS: 25.0000 mg | ORAL_TABLET | Freq: Every day | ORAL | Status: DC

## 2015-03-10 NOTE — Patient Instructions (Addendum)
Medication Instructions:  Your physician has recommended you make the following change in your medication:  1. Start Metoprolol on Monday, November 21 st  in the am ( 12.5 mg ) one half tablet, before home health gets to your house. Have home health check your heart rate if stable continue Metoprolol ( 12. 5 mg ) twice a day if continued bradycardia      hold of medication till seen at next cardiologist office visit.  2. Continue Losartan ( 25 mg ) daily this is for your heart failure 3. Continue aspirin ( 81 mg ) daily till December 11 th  than stop 4. Patient was given samples of Eliquis at office visit today   Labwork: -None  Testing/Procedures: -None  Follow-Up: Your physician recommends that you keep your scheduled  follow-up appointment with Tarri Fuller, PA-C at the [facility] location   Any Other Special Instructions Will Be Listed Below (If Applicable).  Low-Sodium Eating Plan Sodium raises blood pressure and causes water to be held in the body. Getting less sodium from food will help lower your blood pressure, reduce any swelling, and protect your heart, liver, and kidneys. We get sodium by adding salt (sodium chloride) to food. Most of our sodium comes from canned, boxed, and frozen foods. Restaurant foods, fast foods, and pizza are also very high in sodium. Even if you take medicine to lower your blood pressure or to reduce fluid in your body, getting less sodium from your food is important. WHAT IS MY PLAN? Most people should limit their sodium intake to 2,300 mg a day. Your health care provider recommends that you limit your sodium intake to ___2 g_______ a day.  WHAT DO I NEED TO KNOW ABOUT THIS EATING PLAN? For the low-sodium eating plan, you will follow these general guidelines:  Choose foods with a % Daily Value for sodium of less than 5% (as listed on the food label).   Use salt-free seasonings or herbs instead of table salt or sea salt.   Check with your health care  provider or pharmacist before using salt substitutes.   Eat fresh foods.  Eat more vegetables and fruits.  Limit canned vegetables. If you do use them, rinse them well to decrease the sodium.   Limit cheese to 1 oz (28 g) per day.   Eat lower-sodium products, often labeled as "lower sodium" or "no salt added."  Avoid foods that contain monosodium glutamate (MSG). MSG is sometimes added to [place] food and some canned foods.  Check food labels (Nutrition Facts labels) on foods to learn how much sodium is in one serving.  Eat more home-cooked food and less restaurant, buffet, and fast food.  When eating at a restaurant, ask that your food be prepared with less salt, or no salt if possible.  HOW DO I READ FOOD LABELS FOR SODIUM INFORMATION? The Nutrition Facts label lists the amount of sodium in one serving of the food. If you eat more than one serving, you must multiply the listed amount of sodium by the number of servings. Food labels may also identify foods as:  Sodium free--Less than 5 mg in a serving.  Very low sodium--35 mg or less in a serving.  Low sodium--140 mg or less in a serving.  Light in sodium--50% less sodium in a serving. For example, if a food that usually has 300 mg of sodium is changed to become light in sodium, it will have 150 mg of sodium.  Reduced sodium--25% less sodium  in a serving. For example, if a food that usually has 400 mg of sodium is changed to reduced sodium, it will have 300 mg of sodium. WHAT FOODS CAN I EAT? Grains Low-sodium cereals, including oats, puffed wheat and rice, and shredded wheat cereals. Low-sodium crackers. Unsalted rice and pasta. Lower-sodium bread.  Vegetables Frozen or fresh vegetables. Low-sodium or reduced-sodium canned vegetables. Low-sodium or reduced-sodium tomato sauce and paste. Low-sodium or reduced-sodium tomato and vegetable juices.  Fruits Fresh, frozen, and canned fruit. Fruit juice.  Meat and Other  Protein Products Low-sodium canned tuna and salmon. Fresh or frozen meat, poultry, seafood, and fish. Lamb. Unsalted nuts. Dried beans, peas, and lentils without added salt. Unsalted canned beans. Homemade soups without salt. Eggs.  Dairy Milk. Soy milk. Ricotta cheese. Low-sodium or reduced-sodium cheeses. Yogurt.  Condiments Fresh and dried herbs and spices. Salt-free seasonings. Onion and garlic powders. Low-sodium varieties of mustard and ketchup. Fresh or refrigerated horseradish. Lemon juice.  Fats and Oils Reduced-sodium salad dressings. Unsalted butter.  Other Unsalted popcorn and pretzels.  The items listed above may not be a complete list of recommended foods or beverages. Contact your dietitian for more options. WHAT FOODS ARE NOT RECOMMENDED? Grains Instant hot cereals. Bread stuffing, pancake, and biscuit mixes. Croutons. Seasoned rice or pasta mixes. Noodle soup cups. Boxed or frozen macaroni and cheese. Self-rising flour. Regular salted crackers. Vegetables Regular canned vegetables. Regular canned tomato sauce and paste. Regular tomato and vegetable juices. Frozen vegetables in sauces. Salted [place] fries. Olives. Angie Fava. Relishes. Sauerkraut. Salsa. Meat and Other Protein Products Salted, canned, smoked, spiced, or pickled meats, seafood, or fish. Bacon, ham, sausage, hot dogs, corned beef, chipped beef, and packaged luncheon meats. Salt pork. Jerky. Pickled herring. Anchovies, regular canned tuna, and sardines. Salted nuts. Dairy Processed cheese and cheese spreads. Cheese curds. Blue cheese and cottage cheese. Buttermilk.  Condiments Onion and garlic salt, seasoned salt, table salt, and sea salt. Canned and packaged gravies. Worcestershire sauce. Tartar sauce. Barbecue sauce. Teriyaki sauce. Soy sauce, including reduced sodium. Steak sauce. Fish sauce. Oyster sauce. Cocktail sauce. Horseradish that you find on the shelf. Regular ketchup and mustard. Meat  flavorings and tenderizers. Bouillon cubes. Hot sauce. Tabasco sauce. Marinades. Taco seasonings. Relishes. Fats and Oils Regular salad dressings. Salted butter. Margarine. Ghee. Bacon fat.  Other Potato and tortilla chips. Corn chips and puffs. Salted popcorn and pretzels. Canned or dried soups. Pizza. Frozen entrees and pot pies.  The items listed above may not be a complete list of foods and beverages to avoid. Contact your dietitian for more information.   This information is not intended to replace advice given to you by your health care provider. Make sure you discuss any questions you have with your health care provider.   Document Released: 09/28/2001 Document Revised: 04/29/2014 Document Reviewed: 02/10/2013 Elsevier Interactive Patient Education [employer].    If you need a refill on your cardiac medications before your next appointment, please call your pharmacy.

## 2015-03-10 NOTE — Progress Notes (Signed)
03/10/2015 Kyle [name]   30-Oct-1931  [id number]  Primary Physician Viviana Simpler, MD Primary Cardiologist: Dr. Percival Spanish   Reason for Visit/CC: [facility] F/u for Acute/CHF, ICM and CAD  HPI:  81 y.o. male with a past medical history significant for remote cardiac catheterization in 2005 by Dr. Lia Foyer (however no history of PCI performed at that time), stroke 2, prostate cancer, hypertension, dyslipidemia, uncontrolled hypertension, CKD3 and impaired fasting glucose.   He presents to clinic today for post hospital f/u. He was recently admitted to [facility] 02/27/15-03/03/15 for NSTEMI, new systolic CHF and new onset atrial fibrillation. 2D echo showed an EF of 15%. Subsequent LHC showed CAD with 75% stenosed mid LAD s/p PCI with stent plus diffuse disease in the left PDA with an 80% lesion in the proximal nondominant RCA. He was placed on triple therapy with ASA, Plavix and low dose Eliquis, given his newly placed stent and atrial fibrillation. His CHA2DS2 VASc score is 5. Plan is to discontinue ASA after 1 month of therapy, followed by Plavix + Eliquis thereafter. Prior to discharge, he was fitted with a LifeVest for primary prevention of SCD. Plan is to obtain a f/u echo in 3 months to reassess LVF and determine need for AICD.   He presents back to clinic today for post hospital follow-up. He is accompanied by his wife. Since discharge from hospital he reports that he has done fairly well. He denies any recurrent chest pain. He denies any resting dyspnea but continues to have mild dyspnea on exertion. He denies lower extremity edema, orthopnea, or PND. He reports compliance with daily weights and a low sodium diet. He has not been compliant with his LifeVest. He verbalized that he does understand his increased risk of sudden cardiac death given his ischemic cardiomyopathy/heart failure. However, despite this he refused to wear his LifeVest and he returned his device to the [employer]. He  denies any symptoms of palpitations, lightheadedness, dizziness, syncope/near-syncope. There has been some confusion regarding his medications. It appears that he has not been taking his losartan despite no issues with hypotension. He has a  home health nurse that manages his medications. The RN has been holding his Metroprolol due to concerns for bradycardia with resting heart rates in the 50s. His EKG today shows sinus brady with a rate of 58 BP with frequent PVCs. He has not had any metoprolol for several days. He is asymptomatic with his bradycardia.    Current Outpatient Prescriptions  Medication Sig Dispense Refill  . apixaban (ELIQUIS) 2.5 MG TABS tablet Take 1 tablet (2.5 mg total) by mouth 2 (two) times daily. 60 tablet 0  . aspirin 81 MG tablet Take 81 mg by mouth daily.      Kyle [name] atorvastatin (LIPITOR) 80 MG tablet Take 1 tablet (80 mg total) by mouth daily at 6 PM. 30 tablet 0  . clopidogrel (PLAVIX) 75 MG tablet Take 1 tablet (75 mg total) by mouth daily. 30 tablet 0  . ezetimibe (ZETIA) 10 MG tablet Take 1 tablet (10 mg total) by mouth daily. 30 tablet 0  . fish oil-omega-3 fatty acids 1000 MG capsule Take 1 g by mouth daily.    . furosemide (LASIX) 20 MG tablet Take 1 tablet (20 mg total) by mouth daily. 30 tablet 0  . losartan (COZAAR) 25 MG tablet Take 1 tablet (25 mg total) by mouth daily. 90 tablet 3  . Metoprolol Tartrate 37.5 MG TABS Take 37.5 mg by mouth 2 (two) times  daily. 60 tablet 3  . Red Yeast Rice 600 MG CAPS Take 1 capsule by mouth daily.     . traMADol (ULTRAM) 50 MG tablet TAKE 1 TABLET BY MOUTH 3 TIMES A DAY 90 tablet 0  . saccharomyces boulardii (FLORASTOR) 250 MG capsule Take 1 capsule (250 mg total) by mouth 2 (two) times daily. (Patient not taking: Reported on 03/10/2015) 30 capsule 0   No current facility-administered medications for this visit.    Allergies  Allergen Reactions  . Simvastatin     REACTION: myalgias    Social History   Social History  .  Marital Status: Married    Spouse Name: Kyle [name]  . Number of Children: 1  . Years of Education: Kyle [name]   Occupational History  . retired    Social History Main Topics  . Smoking status: Never Smoker   . Smokeless tobacco: Former Systems developer    Types: Chew    Quit date: 04/22/2004  . Alcohol Use: No  . Drug Use: No  . Sexual Activity: Not on file   Other Topics Concern  . Not on file   Social History Narrative   Has living will   Wife is health care POA   Would accept CPR but no prolonged machines.    Would not want a feeding tube if cognitively unaware              Review of Systems: General: negative for chills, fever, night sweats or weight changes.  Cardiovascular: negative for chest pain, dyspnea on exertion, edema, orthopnea, palpitations, paroxysmal nocturnal dyspnea or shortness of breath Dermatological: negative for rash Respiratory: negative for cough or wheezing Urologic: negative for hematuria Abdominal: negative for nausea, vomiting, diarrhea, bright red blood per rectum, melena, or hematemesis Neurologic: negative for visual changes, syncope, or dizziness All other systems reviewed and are otherwise negative except as noted above.    Blood pressure 130/90, pulse 38, weight 195 lb 12.8 oz (88.814 kg), SpO2 98 %.  General appearance: alert, cooperative and no distress Neck: no carotid bruit and no JVD Lungs: clear to auscultation bilaterally Heart: regular rate and rhythm and slow rate Extremities: no LEE Pulses: 2+ and symmetric Skin: warm and dry Neurologic: Grossly normal  EKG sinus brady with frequent PVCs. 58 bpm.   ASSESSMENT AND PLAN:   1. CAD/NSTEMI -- Troponin 0.18--> 0.17--> 0.18 -- S/p LHC on 03/02/15 with mLAD lesion ~75% stenosed s/p PCI/DES. Diffuse disease in the L PDA and dLCx and 80% lesion in non dominant RCA. Normal LVEDP. No recurrent CP. -- Continue DAPT w/ ASA/Plavix. He also has newly diagnosed PAF. He will require triple therapy for 1  month with ASA, plavix and Eliquis. He will drop the ASA in 1 month (04/02/15).  -- BB has been on hold for bradycardia. He was discharged home on 37.5 mg of metoprolol BID. He has been off of metoprolol for several days as advised by his [facility] RN. His EKG shows frequent PVCs, in the setting of low EF. Will try to restart metoprolol at a low dose to see how he tolerates. Will restart 12.5 mg. HH RN may discontinue BB if he continues to have significant bradycardia. If unable to tolerate low dose metoprolol, may consider changing BB to Coreg, which has lesser effect on HR. Will reassess at his next office f/u in 2 weeks.  --Continue atorvastatin 80mg .   Acute systolic heart failure/newly diagnosed cardiomyopathy: -- EF 15-20%, mild LVH, mild MR, mild systolic RV dysfunction and  mod TR.  -- S/p LHC with CAD- likely ischemic CM.  -- He has frequent ventricular ectopy on telemetry, NSVT and 12 beat run of Vtac. He declined LifeVest despite known risk of SCD. Repeat ECHO in 90 days to re-evaluate LV function and possible need for ICD placement -- Continue Losartan and try restarting low dose metoprolol. If unable to tolerate metoprolol, can consider converting to low dose Coreg at this next f/u appt.  -- He appears euvolemic today. Continue maintenance diuretics. 20mg  of lasix po qd.   New onset atrial fibrillation: currently back in sinus -- CHADSVASC of at least 7 ( CHF 1, HTN 1, Age 53, CVA 2, CAD 1)  -- continue metoprolol and Eliquis 2.5mg  BID. He is >80, creat < 1.5 (very close 1.48), >60 kg. He wil be on triple therapy with ASA, plavix and Eliquis for 1 month and then we will stop the ASA. If his kidney function improves, we can increase him to the 5mg  dosing in 1 month when he drops the ASA.   Essential hypertension: -- well controlled on current regimen.   HLD- LDL 128. Goal <70. Continue Lipitor. Recheck FLP in 6 weeks.    PLAN  F/u in 2 weeks for reassessment and to see if HR and BP will  allow for further medication titration of his HF meds.   Lyda Jester PA-C 03/10/2015 10:16 AM

## 2015-03-13 DIAGNOSIS — I4891 Unspecified atrial fibrillation: Secondary | ICD-10-CM | POA: Diagnosis not present

## 2015-03-13 DIAGNOSIS — E785 Hyperlipidemia, unspecified: Secondary | ICD-10-CM | POA: Diagnosis not present

## 2015-03-13 DIAGNOSIS — K219 Gastro-esophageal reflux disease without esophagitis: Secondary | ICD-10-CM | POA: Diagnosis not present

## 2015-03-13 DIAGNOSIS — I509 Heart failure, unspecified: Secondary | ICD-10-CM | POA: Diagnosis not present

## 2015-03-13 DIAGNOSIS — N183 Chronic kidney disease, stage 3 (moderate): Secondary | ICD-10-CM | POA: Diagnosis not present

## 2015-03-13 DIAGNOSIS — I129 Hypertensive chronic kidney disease with stage 1 through stage 4 chronic kidney disease, or unspecified chronic kidney disease: Secondary | ICD-10-CM | POA: Diagnosis not present

## 2015-03-14 DIAGNOSIS — I509 Heart failure, unspecified: Secondary | ICD-10-CM | POA: Diagnosis not present

## 2015-03-14 DIAGNOSIS — N183 Chronic kidney disease, stage 3 (moderate): Secondary | ICD-10-CM | POA: Diagnosis not present

## 2015-03-14 DIAGNOSIS — I4891 Unspecified atrial fibrillation: Secondary | ICD-10-CM | POA: Diagnosis not present

## 2015-03-14 DIAGNOSIS — I129 Hypertensive chronic kidney disease with stage 1 through stage 4 chronic kidney disease, or unspecified chronic kidney disease: Secondary | ICD-10-CM | POA: Diagnosis not present

## 2015-03-14 DIAGNOSIS — K219 Gastro-esophageal reflux disease without esophagitis: Secondary | ICD-10-CM | POA: Diagnosis not present

## 2015-03-14 DIAGNOSIS — E785 Hyperlipidemia, unspecified: Secondary | ICD-10-CM | POA: Diagnosis not present

## 2015-03-15 DIAGNOSIS — K219 Gastro-esophageal reflux disease without esophagitis: Secondary | ICD-10-CM | POA: Diagnosis not present

## 2015-03-15 DIAGNOSIS — I129 Hypertensive chronic kidney disease with stage 1 through stage 4 chronic kidney disease, or unspecified chronic kidney disease: Secondary | ICD-10-CM | POA: Diagnosis not present

## 2015-03-15 DIAGNOSIS — E785 Hyperlipidemia, unspecified: Secondary | ICD-10-CM | POA: Diagnosis not present

## 2015-03-15 DIAGNOSIS — N183 Chronic kidney disease, stage 3 (moderate): Secondary | ICD-10-CM | POA: Diagnosis not present

## 2015-03-15 DIAGNOSIS — I4891 Unspecified atrial fibrillation: Secondary | ICD-10-CM | POA: Diagnosis not present

## 2015-03-15 DIAGNOSIS — I509 Heart failure, unspecified: Secondary | ICD-10-CM | POA: Diagnosis not present

## 2015-03-17 DIAGNOSIS — I4891 Unspecified atrial fibrillation: Secondary | ICD-10-CM | POA: Diagnosis not present

## 2015-03-17 DIAGNOSIS — I509 Heart failure, unspecified: Secondary | ICD-10-CM | POA: Diagnosis not present

## 2015-03-17 DIAGNOSIS — K219 Gastro-esophageal reflux disease without esophagitis: Secondary | ICD-10-CM | POA: Diagnosis not present

## 2015-03-17 DIAGNOSIS — N183 Chronic kidney disease, stage 3 (moderate): Secondary | ICD-10-CM | POA: Diagnosis not present

## 2015-03-17 DIAGNOSIS — I129 Hypertensive chronic kidney disease with stage 1 through stage 4 chronic kidney disease, or unspecified chronic kidney disease: Secondary | ICD-10-CM | POA: Diagnosis not present

## 2015-03-17 DIAGNOSIS — E785 Hyperlipidemia, unspecified: Secondary | ICD-10-CM | POA: Diagnosis not present

## 2015-03-20 DIAGNOSIS — K219 Gastro-esophageal reflux disease without esophagitis: Secondary | ICD-10-CM | POA: Diagnosis not present

## 2015-03-20 DIAGNOSIS — N183 Chronic kidney disease, stage 3 (moderate): Secondary | ICD-10-CM | POA: Diagnosis not present

## 2015-03-20 DIAGNOSIS — I129 Hypertensive chronic kidney disease with stage 1 through stage 4 chronic kidney disease, or unspecified chronic kidney disease: Secondary | ICD-10-CM | POA: Diagnosis not present

## 2015-03-20 DIAGNOSIS — E785 Hyperlipidemia, unspecified: Secondary | ICD-10-CM | POA: Diagnosis not present

## 2015-03-20 DIAGNOSIS — I509 Heart failure, unspecified: Secondary | ICD-10-CM | POA: Diagnosis not present

## 2015-03-20 DIAGNOSIS — I4891 Unspecified atrial fibrillation: Secondary | ICD-10-CM | POA: Diagnosis not present

## 2015-03-21 DIAGNOSIS — E785 Hyperlipidemia, unspecified: Secondary | ICD-10-CM | POA: Diagnosis not present

## 2015-03-21 DIAGNOSIS — I4891 Unspecified atrial fibrillation: Secondary | ICD-10-CM | POA: Diagnosis not present

## 2015-03-21 DIAGNOSIS — I129 Hypertensive chronic kidney disease with stage 1 through stage 4 chronic kidney disease, or unspecified chronic kidney disease: Secondary | ICD-10-CM | POA: Diagnosis not present

## 2015-03-21 DIAGNOSIS — N183 Chronic kidney disease, stage 3 (moderate): Secondary | ICD-10-CM | POA: Diagnosis not present

## 2015-03-21 DIAGNOSIS — I509 Heart failure, unspecified: Secondary | ICD-10-CM | POA: Diagnosis not present

## 2015-03-21 DIAGNOSIS — K219 Gastro-esophageal reflux disease without esophagitis: Secondary | ICD-10-CM | POA: Diagnosis not present

## 2015-03-22 ENCOUNTER — Ambulatory Visit (INDEPENDENT_AMBULATORY_CARE_PROVIDER_SITE_OTHER): Payer: Medicare Other | Admitting: Physician Assistant

## 2015-03-22 ENCOUNTER — Encounter: Payer: Self-pay | Admitting: Physician Assistant

## 2015-03-22 VITALS — BP 128/68 | HR 84 | Ht 72.0 in | Wt 195.1 lb

## 2015-03-22 DIAGNOSIS — I25119 Atherosclerotic heart disease of native coronary artery with unspecified angina pectoris: Secondary | ICD-10-CM

## 2015-03-22 DIAGNOSIS — E785 Hyperlipidemia, unspecified: Secondary | ICD-10-CM

## 2015-03-22 DIAGNOSIS — I5022 Chronic systolic (congestive) heart failure: Secondary | ICD-10-CM

## 2015-03-22 DIAGNOSIS — I5021 Acute systolic (congestive) heart failure: Secondary | ICD-10-CM

## 2015-03-22 DIAGNOSIS — I480 Paroxysmal atrial fibrillation: Secondary | ICD-10-CM

## 2015-03-22 DIAGNOSIS — I10 Essential (primary) hypertension: Secondary | ICD-10-CM

## 2015-03-22 DIAGNOSIS — I255 Ischemic cardiomyopathy: Secondary | ICD-10-CM | POA: Diagnosis not present

## 2015-03-22 MED ORDER — APIXABAN 2.5 MG PO TABS: 2.5000 mg | ORAL_TABLET | Freq: Two times a day (BID) | ORAL | Status: DC

## 2015-03-22 MED ORDER — EZETIMIBE 10 MG PO TABS: 10.0000 mg | ORAL_TABLET | Freq: Every day | ORAL | Status: DC

## 2015-03-22 MED ORDER — ATORVASTATIN CALCIUM 80 MG PO TABS: 80.0000 mg | ORAL_TABLET | Freq: Every day | ORAL | Status: DC

## 2015-03-22 MED ORDER — FUROSEMIDE 20 MG PO TABS: 20.0000 mg | ORAL_TABLET | Freq: Every day | ORAL | Status: DC

## 2015-03-22 MED ORDER — METOPROLOL TARTRATE 25 MG PO TABS: 25.0000 mg | ORAL_TABLET | Freq: Two times a day (BID) | ORAL | Status: DC

## 2015-03-22 NOTE — Progress Notes (Signed)
Patient ID: Kyle [name], male   DOB: 12-01-1931, 81 y.o.   MRN: [id number]    Date:  03/22/2015   ID:  Kyle [name], DOB 12/06/1931, MRN [id number]  PCP:  Viviana Simpler, MD  Primary Cardiologist:  [facility]  Chief Complaint  Patient presents with  . Follow-up    2 week//pt c/o lack of energy, states no other Sx.     History of Present Illness: Kyle [name] is a 81 y.o. male  81 y.o. male with a past medical history significant for remote cardiac catheterization in 2005 by Dr. Lia Foyer (however no history of PCI performed at that time), stroke 2, prostate cancer, hypertension, dyslipidemia, uncontrolled hypertension, CKD3 and impaired fasting glucose.   He presents to clinic today for post hospital f/u. He was recently admitted to [facility] 02/27/15-03/03/15 for NSTEMI, new systolic CHF and new onset atrial fibrillation. 2D echo showed an EF of 15%. Subsequent LHC showed CAD with 75% stenosed mid LAD s/p PCI with stent plus diffuse disease in the left PDA with an 80% lesion in the proximal nondominant RCA. He was placed on triple therapy with ASA, Plavix and low dose Eliquis, given his newly placed stent and atrial fibrillation. His CHA2DS2 VASc score is 5. Plan is to discontinue ASA after 1 month of therapy, followed by Plavix + Eliquis thereafter. Prior to discharge, he was fitted with a LifeVest for primary prevention of SCD. Plan is to obtain a f/u echo in 3 months to reassess LVF and determine need for AICD.   Patient was seen 2 weeks ago for posthospital follow-up Since discharge from hospital he reports that he has done fairly well. He denies any recurrent chest pain. He denies any resting dyspnea but continues to have mild dyspnea on exertion. He denies lower extremity edema, orthopnea, or PND. He reports compliance with daily weights and a low sodium diet. He has not been compliant with his LifeVest. He verbalized that he does understand his increased risk of sudden cardiac death given his  ischemic cardiomyopathy/heart failure. However, despite this he refused to wear his LifeVest and he returned his device to the [employer].  He has a home health nurse that manages his medications. The RN has been holding his Metroprolol due to concerns for bradycardia with resting heart rates in the 50s. His EKG 2 weeks ago showed sinus brady with a rate of 58 BP with frequent PVCs and that was without having any metoprolol for several days. He is asymptomatic with his bradycardia.   Patient is here for two-week follow-up. He reports doing well with no particular complaints. He does say that since his beta blocker has been reduced to 12.5 mg he's been sleeping a lot better.  His breathing is better and he has no orthopnea. Weight is stable.  No dizziness.    The patient currently denies nausea, vomiting, fever, chest pain, shortness of breath, orthopnea, dizziness, PND, cough, congestion, abdominal pain, hematochezia, melena, lower extremity edema, claudication.  Wt Readings from Last 3 Encounters:  03/22/15 195 lb 1.6 oz (88.497 kg)  03/10/15 195 lb 12.8 oz (88.814 kg)  03/03/15 194 lb 0.1 oz (88 kg)     Past Medical History  Diagnosis Date  . History of prostate cancer   . Hyperlipidemia   . Hypertension   . Arthritis   . Impaired fasting glucose   . Detached retina     right  . H/O dizziness   . Urgency of urination   .  Cancer [facility])     prostate CA  . GERD (gastroesophageal reflux disease)   . Stroke [facility]) ~ 1995-02/2009 X 2    /notes 08/22/2010  . Claustrophobia     "EXTREMELY" (03/02/2015)    Current Outpatient Prescriptions  Medication Sig Dispense Refill  . apixaban (ELIQUIS) 2.5 MG TABS tablet Take 1 tablet (2.5 mg total) by mouth 2 (two) times daily. 60 tablet 5  . aspirin 81 MG tablet Take 81 mg by mouth daily.      [name] atorvastatin (LIPITOR) 80 MG tablet Take 1 tablet (80 mg total) by mouth daily at 6 PM. 30 tablet 11  . clopidogrel (PLAVIX) 75 MG tablet Take 1  tablet (75 mg total) by mouth daily. 30 tablet 0  . ezetimibe (ZETIA) 10 MG tablet Take 1 tablet (10 mg total) by mouth daily. 30 tablet 11  . fish oil-omega-3 fatty acids 1000 MG capsule Take 1 g by mouth daily.    . furosemide (LASIX) 20 MG tablet Take 1 tablet (20 mg total) by mouth daily. 30 tablet 11  . losartan (COZAAR) 25 MG tablet Take 1 tablet (25 mg total) by mouth daily. 30 tablet 9  . metoprolol tartrate (LOPRESSOR) 25 MG tablet Take 1 tablet (25 mg total) by mouth 2 (two) times daily. 60 tablet 11  . Red Yeast Rice 600 MG CAPS Take 1 capsule by mouth daily.     . traMADol (ULTRAM) 50 MG tablet TAKE 1 TABLET BY MOUTH 3 TIMES A DAY (Patient taking differently: TAKE 1 TABLET BY MOUTH 3 TIMES A DAY AS NEEDED) 90 tablet 0   No current facility-administered medications for this visit.    Allergies:    Allergies  Allergen Reactions  . Simvastatin     REACTION: myalgias    Social History:  The patient  reports that he has never smoked. He quit smokeless tobacco use about 10 years ago. His smokeless tobacco use included Chew. He reports that he does not drink alcohol or use illicit drugs.   Family history:   Family History  Problem Relation Age of Onset  . Heart disease Mother   . Coronary artery disease Mother   . Heart disease Father   . Coronary artery disease Father   . Heart disease Brother   . Diabetes Neg Hx   . Heart disease Brother   . Cancer Brother     prostate    ROS:  Please see the history of present illness.  All other systems reviewed and negative.   PHYSICAL EXAM: VS:  BP 128/68 mmHg  Pulse 84  Ht 6' (1.829 m)  Wt 195 lb 1.6 oz (88.497 kg)  BMI 26.45 kg/m2 Well nourished, well developed, in no acute distress HEENT: Pupils are equal round react to light accommodation extraocular movements are intact.  Neck: no JVDNo cervical lymphadenopathy. Cardiac: Irregular rate and rhythm with 1/6 systolic murmur Lungs:  clear to auscultation bilaterally, no  wheezing, rhonchi or rales Abd: soft, nontender, positive bowel sounds all quadrants, no hepatosplenomegaly Ext: no lower extremity edema.  2+ radial and dorsalis pedis pulses. Skin: warm and dry Neuro:  Grossly normal  EKG:  Looks like a sinus rhythm but hard to tell. left anterior fascicular block very frequent PVCs     rate 84 bpm  ASSESSMENT AND PLAN:  Problem List Items Addressed This Visit    Paroxysmal atrial fibrillation (HCC)   Relevant Medications   metoprolol tartrate (LOPRESSOR) 25 MG tablet   furosemide (LASIX)  20 MG tablet   ezetimibe (ZETIA) 10 MG tablet   apixaban (ELIQUIS) 2.5 MG TABS tablet   atorvastatin (LIPITOR) 80 MG tablet   Hyperlipemia   Relevant Medications   metoprolol tartrate (LOPRESSOR) 25 MG tablet   furosemide (LASIX) 20 MG tablet   ezetimibe (ZETIA) 10 MG tablet   apixaban (ELIQUIS) 2.5 MG TABS tablet   atorvastatin (LIPITOR) 80 MG tablet   Essential hypertension, benign   Relevant Medications   metoprolol tartrate (LOPRESSOR) 25 MG tablet   furosemide (LASIX) 20 MG tablet   ezetimibe (ZETIA) 10 MG tablet   apixaban (ELIQUIS) 2.5 MG TABS tablet   atorvastatin (LIPITOR) 80 MG tablet   Coronary artery disease involving native coronary artery of native heart with angina pectoris (HCC)   Relevant Medications   metoprolol tartrate (LOPRESSOR) 25 MG tablet   furosemide (LASIX) 20 MG tablet   ezetimibe (ZETIA) 10 MG tablet   apixaban (ELIQUIS) 2.5 MG TABS tablet   atorvastatin (LIPITOR) 80 MG tablet   Chronic systolic congestive heart failure, NYHA class 2 (HCC) - Primary   Relevant Medications   metoprolol tartrate (LOPRESSOR) 25 MG tablet   furosemide (LASIX) 20 MG tablet   ezetimibe (ZETIA) 10 MG tablet   apixaban (ELIQUIS) 2.5 MG TABS tablet   atorvastatin (LIPITOR) 80 MG tablet    Other Visit Diagnoses    Cardiomyopathy, ischemic        Relevant Medications    metoprolol tartrate (LOPRESSOR) 25 MG tablet    furosemide (LASIX) 20 MG  tablet    ezetimibe (ZETIA) 10 MG tablet    apixaban (ELIQUIS) 2.5 MG TABS tablet    atorvastatin (LIPITOR) 80 MG tablet    Other Relevant Orders    EKG 12-Lead    ECHOCARDIOGRAM COMPLETE      Paroxysmal atrial fibrillation: EKG is indeterminate looks like he has some P waves before the QRS however he is having very frequent PVCs and PACs. Could be A. Fib.  regardless his rate is controlled and he is on eliquis 2.5 due to age and creatinine clearance.  Coronary Artery disease No complaints of angina. Currently on aspirin, beta blocker 12.5 mg twice daily, Plavix. He is going to stop the aspirin on December 11. Beta blocker was previously dropped from 30 7/2-12.5 due to bradycardia. I recommended we increase it back to 25 mg twice daily however the patient states he does not want to do that because once he decreased the dose he was able to sleep much better.    Ischemic, cardiomyopathy Ejection fraction 15-20%. Patient was discharged on a LifeVest decided he did not like wearing it.  Given his reduced ejection fraction and nonsustained ventricular tachycardia that we saw in the hospital he was told not to drive. I reinforced this again today when he asked if he could start driving. A new echocardiogram is been ordered for 06/02/2088 days after his discharge.  Patient appears euvolemic. We'll continue 20 mg of Lasix.  Chronic Systolic heart failure: See above  Essential hypertension: Blood pressure well-controlled. No changes currently he is on losartan  Hyperlipidemia: Continue statin.

## 2015-03-22 NOTE — Patient Instructions (Addendum)
Your physician has requested that you have an echocardiogram in February. This will be done at our [place] office on [facility], [address]). Echocardiography is a painless test that uses sound waves to create images of your heart. It provides your doctor with information about the size and shape of your heart and how well your heart's chambers and valves are working. This procedure takes approximately one hour. There are no restrictions for this procedure.  Your physician recommends that you schedule a follow-up appointment in: with Dr. Percival Spanish in 3 months. 1 week after ECHO.

## 2015-03-23 DIAGNOSIS — I129 Hypertensive chronic kidney disease with stage 1 through stage 4 chronic kidney disease, or unspecified chronic kidney disease: Secondary | ICD-10-CM | POA: Diagnosis not present

## 2015-03-23 DIAGNOSIS — N183 Chronic kidney disease, stage 3 (moderate): Secondary | ICD-10-CM | POA: Diagnosis not present

## 2015-03-23 DIAGNOSIS — I4891 Unspecified atrial fibrillation: Secondary | ICD-10-CM | POA: Diagnosis not present

## 2015-03-23 DIAGNOSIS — K219 Gastro-esophageal reflux disease without esophagitis: Secondary | ICD-10-CM | POA: Diagnosis not present

## 2015-03-23 DIAGNOSIS — E785 Hyperlipidemia, unspecified: Secondary | ICD-10-CM | POA: Diagnosis not present

## 2015-03-23 DIAGNOSIS — I509 Heart failure, unspecified: Secondary | ICD-10-CM | POA: Diagnosis not present

## 2015-03-24 DIAGNOSIS — I129 Hypertensive chronic kidney disease with stage 1 through stage 4 chronic kidney disease, or unspecified chronic kidney disease: Secondary | ICD-10-CM | POA: Diagnosis not present

## 2015-03-24 DIAGNOSIS — N183 Chronic kidney disease, stage 3 (moderate): Secondary | ICD-10-CM | POA: Diagnosis not present

## 2015-03-24 DIAGNOSIS — I4891 Unspecified atrial fibrillation: Secondary | ICD-10-CM | POA: Diagnosis not present

## 2015-03-24 DIAGNOSIS — I509 Heart failure, unspecified: Secondary | ICD-10-CM | POA: Diagnosis not present

## 2015-03-24 DIAGNOSIS — E785 Hyperlipidemia, unspecified: Secondary | ICD-10-CM | POA: Diagnosis not present

## 2015-03-24 DIAGNOSIS — K219 Gastro-esophageal reflux disease without esophagitis: Secondary | ICD-10-CM | POA: Diagnosis not present

## 2015-03-27 DIAGNOSIS — Z23 Encounter for immunization: Secondary | ICD-10-CM | POA: Diagnosis not present

## 2015-03-28 DIAGNOSIS — N183 Chronic kidney disease, stage 3 (moderate): Secondary | ICD-10-CM | POA: Diagnosis not present

## 2015-03-28 DIAGNOSIS — I129 Hypertensive chronic kidney disease with stage 1 through stage 4 chronic kidney disease, or unspecified chronic kidney disease: Secondary | ICD-10-CM | POA: Diagnosis not present

## 2015-03-28 DIAGNOSIS — E785 Hyperlipidemia, unspecified: Secondary | ICD-10-CM | POA: Diagnosis not present

## 2015-03-28 DIAGNOSIS — I4891 Unspecified atrial fibrillation: Secondary | ICD-10-CM | POA: Diagnosis not present

## 2015-03-28 DIAGNOSIS — I509 Heart failure, unspecified: Secondary | ICD-10-CM | POA: Diagnosis not present

## 2015-03-28 DIAGNOSIS — K219 Gastro-esophageal reflux disease without esophagitis: Secondary | ICD-10-CM | POA: Diagnosis not present

## 2015-03-31 ENCOUNTER — Other Ambulatory Visit: Payer: Self-pay | Admitting: Internal Medicine

## 2015-03-31 DIAGNOSIS — I129 Hypertensive chronic kidney disease with stage 1 through stage 4 chronic kidney disease, or unspecified chronic kidney disease: Secondary | ICD-10-CM | POA: Diagnosis not present

## 2015-03-31 DIAGNOSIS — N183 Chronic kidney disease, stage 3 (moderate): Secondary | ICD-10-CM | POA: Diagnosis not present

## 2015-03-31 DIAGNOSIS — K219 Gastro-esophageal reflux disease without esophagitis: Secondary | ICD-10-CM | POA: Diagnosis not present

## 2015-03-31 DIAGNOSIS — E785 Hyperlipidemia, unspecified: Secondary | ICD-10-CM | POA: Diagnosis not present

## 2015-03-31 DIAGNOSIS — I509 Heart failure, unspecified: Secondary | ICD-10-CM | POA: Diagnosis not present

## 2015-03-31 DIAGNOSIS — I4891 Unspecified atrial fibrillation: Secondary | ICD-10-CM | POA: Diagnosis not present

## 2015-03-31 NOTE — Telephone Encounter (Signed)
Done by cardiologist

## 2015-04-03 ENCOUNTER — Ambulatory Visit (INDEPENDENT_AMBULATORY_CARE_PROVIDER_SITE_OTHER): Payer: Medicare Other | Admitting: Internal Medicine

## 2015-04-03 ENCOUNTER — Encounter: Payer: Self-pay | Admitting: Internal Medicine

## 2015-04-03 VITALS — BP 138/68 | HR 61 | Temp 98.0°F | Wt 186.0 lb

## 2015-04-03 DIAGNOSIS — I739 Peripheral vascular disease, unspecified: Secondary | ICD-10-CM | POA: Diagnosis not present

## 2015-04-03 DIAGNOSIS — I255 Ischemic cardiomyopathy: Secondary | ICD-10-CM | POA: Diagnosis not present

## 2015-04-03 MED ORDER — TRAMADOL HCL 50 MG PO TABS: 50.0000 mg | ORAL_TABLET | Freq: Three times a day (TID) | ORAL | Status: DC | PRN

## 2015-04-03 NOTE — Progress Notes (Signed)
Subjective:    Patient ID: Kyle [name], male    DOB: Dec 02, 1931, 81 y.o.   MRN: [id number]  HPI Here due to right foot pain Wife is here  Thinks it swelled like an "abscess" Terrible pain when walking on it At toes and heel---can hardly walk Started yesterday No known injury  No history of the gout Hasn't tried anything for it  Current Outpatient Prescriptions on File Prior to Visit  Medication Sig Dispense Refill  . apixaban (ELIQUIS) 2.5 MG TABS tablet Take 1 tablet (2.5 mg total) by mouth 2 (two) times daily. 60 tablet 5  . aspirin 81 MG tablet Take 81 mg by mouth daily.      [name] atorvastatin (LIPITOR) 80 MG tablet Take 1 tablet (80 mg total) by mouth daily at 6 PM. 30 tablet 11  . clopidogrel (PLAVIX) 75 MG tablet Take 1 tablet (75 mg total) by mouth daily. 30 tablet 0  . ezetimibe (ZETIA) 10 MG tablet Take 1 tablet (10 mg total) by mouth daily. 30 tablet 11  . fish oil-omega-3 fatty acids 1000 MG capsule Take 1 g by mouth daily.    . furosemide (LASIX) 20 MG tablet Take 1 tablet (20 mg total) by mouth daily. 30 tablet 11  . losartan (COZAAR) 25 MG tablet Take 1 tablet (25 mg total) by mouth daily. 30 tablet 9  . metoprolol tartrate (LOPRESSOR) 25 MG tablet Take 1 tablet (25 mg total) by mouth 2 (two) times daily. 60 tablet 11  . Red Yeast Rice 600 MG CAPS Take 1 capsule by mouth daily.     . traMADol (ULTRAM) 50 MG tablet TAKE 1 TABLET BY MOUTH 3 TIMES A DAY (Patient taking differently: TAKE 1 TABLET BY MOUTH 3 TIMES A DAY AS NEEDED) 90 tablet 0   No current facility-administered medications on file prior to visit.    Allergies  Allergen Reactions  . Simvastatin     REACTION: myalgias    Past Medical History  Diagnosis Date  . History of prostate cancer   . Hyperlipidemia   . Hypertension   . Arthritis   . Impaired fasting glucose   . Detached retina     right  . H/O dizziness   . Urgency of urination   . Cancer [facility])     prostate CA  . GERD  (gastroesophageal reflux disease)   . Stroke [facility]) ~ 1995-02/2009 X 2    /notes 08/22/2010  . Claustrophobia     "EXTREMELY" (03/02/2015)    Past Surgical History  Procedure Laterality Date  . Prostate surgery  1996    Archie Endo 08/22/2010  . Tonsillectomy    . Colonoscopy w/ polypectomy    . Lumbar laminectomy/decompression microdiscectomy  01/24/2012    Procedure: LUMBAR LAMINECTOMY/DECOMPRESSION MICRODISCECTOMY 1 LEVEL;  Surgeon: Winfield Cunas, MD;  Location: [place] NEURO ORS;  Service: Neurosurgery;  Laterality: Right;  RIGHT Lumbar Three-Four far lateral diskectomy  . Eye surgery Right   . Retinal detachment repair w/ scleral buckle le Right 08/2006    Archie Endo 08/22/2010  . Cardiac catheterization  ~ 1995    Archie Endo 08/22/2010  . Hernia repair      hx UHR/notes 08/22/2010  . Inguinal hernia repair Left     Archie Endo 08/22/2010  . Umbilical hernia repair      hx/notes 08/22/2010  . Cardiac catheterization N/A 03/02/2015    Procedure: Left Heart Cath and Coronary Angiography;  Surgeon: Jettie Booze, MD;  Location: [place] CV  LAB;  Service: Cardiovascular;  Laterality: N/A;  . Cardiac catheterization  03/02/2015    Procedure: Coronary Stent Intervention;  Surgeon: Jettie Booze, MD;  Location: [place] CV LAB;  Service: Cardiovascular;;    Family History  Problem Relation Age of Onset  . Heart disease Mother   . Coronary artery disease Mother   . Heart disease Father   . Coronary artery disease Father   . Heart disease Brother   . Diabetes Neg Hx   . Heart disease Brother   . Cancer Brother     prostate    Social History   Social History  . Marital Status: Married    Spouse Name: N/A  . Number of Children: 1  . Years of Education: N/A   Occupational History  . retired    Social History Main Topics  . Smoking status: Never Smoker   . Smokeless tobacco: Former Systems developer    Types: Chew    Quit date: 04/22/2004  . Alcohol Use: No  . Drug Use: No  . Sexual Activity:  Not on file   Other Topics Concern  . Not on file   Social History Narrative   Has living will   Wife is health care POA   Would accept CPR but no prolonged machines.    Would not want a feeding tube if cognitively unaware            Review of Systems No fever Breathing is better Feet were not more swollen---first noticed yesterday    Objective:   Physical Exam  Musculoskeletal:  No distinct joint tenderness in right foot No heel tenderness No pain at rest  Skin:  Dependent rubor--more in left than right foot No ulcers No inflammation in joints          Assessment & Plan:

## 2015-04-03 NOTE — Assessment & Plan Note (Signed)
Severe right foot pain--only with walking Does ease up some after a while No specific tenderness or any findings to suggest gout or other arthritis Will check ABI ?if that is normal, some kind of strain?

## 2015-04-03 NOTE — Progress Notes (Signed)
Pre visit review using our clinic review tool, if applicable. No additional management support is needed unless otherwise documented below in the visit note. 

## 2015-04-03 NOTE — Addendum Note (Signed)
Addended by: Despina Hidden on: 04/03/2015 01:05 PM   Modules accepted: Orders

## 2015-04-05 DIAGNOSIS — N183 Chronic kidney disease, stage 3 (moderate): Secondary | ICD-10-CM | POA: Diagnosis not present

## 2015-04-05 DIAGNOSIS — I509 Heart failure, unspecified: Secondary | ICD-10-CM | POA: Diagnosis not present

## 2015-04-05 DIAGNOSIS — K219 Gastro-esophageal reflux disease without esophagitis: Secondary | ICD-10-CM | POA: Diagnosis not present

## 2015-04-05 DIAGNOSIS — I4891 Unspecified atrial fibrillation: Secondary | ICD-10-CM | POA: Diagnosis not present

## 2015-04-05 DIAGNOSIS — I129 Hypertensive chronic kidney disease with stage 1 through stage 4 chronic kidney disease, or unspecified chronic kidney disease: Secondary | ICD-10-CM | POA: Diagnosis not present

## 2015-04-05 DIAGNOSIS — E785 Hyperlipidemia, unspecified: Secondary | ICD-10-CM | POA: Diagnosis not present

## 2015-04-09 ENCOUNTER — Emergency Department (HOSPITAL_COMMUNITY)
Admission: EM | Admit: 2015-04-09 | Discharge: 2015-04-09 | Disposition: A | Discharge status: Home / Self Care | Payer: Medicare Other | Attending: Emergency Medicine | Admitting: Emergency Medicine

## 2015-04-09 ENCOUNTER — Encounter (HOSPITAL_COMMUNITY): Payer: Self-pay | Admitting: Emergency Medicine

## 2015-04-09 DIAGNOSIS — Z7902 Long term (current) use of antithrombotics/antiplatelets: Secondary | ICD-10-CM | POA: Diagnosis not present

## 2015-04-09 DIAGNOSIS — E785 Hyperlipidemia, unspecified: Secondary | ICD-10-CM | POA: Insufficient documentation

## 2015-04-09 DIAGNOSIS — Z8719 Personal history of other diseases of the digestive system: Secondary | ICD-10-CM | POA: Diagnosis not present

## 2015-04-09 DIAGNOSIS — I10 Essential (primary) hypertension: Secondary | ICD-10-CM | POA: Insufficient documentation

## 2015-04-09 DIAGNOSIS — M1990 Unspecified osteoarthritis, unspecified site: Secondary | ICD-10-CM | POA: Insufficient documentation

## 2015-04-09 DIAGNOSIS — M79671 Pain in right foot: Secondary | ICD-10-CM | POA: Diagnosis present

## 2015-04-09 DIAGNOSIS — Z8546 Personal history of malignant neoplasm of prostate: Secondary | ICD-10-CM | POA: Insufficient documentation

## 2015-04-09 DIAGNOSIS — Z9889 Other specified postprocedural states: Secondary | ICD-10-CM | POA: Diagnosis not present

## 2015-04-09 DIAGNOSIS — L03115 Cellulitis of right lower limb: Secondary | ICD-10-CM | POA: Insufficient documentation

## 2015-04-09 DIAGNOSIS — Z8673 Personal history of transient ischemic attack (TIA), and cerebral infarction without residual deficits: Secondary | ICD-10-CM | POA: Diagnosis not present

## 2015-04-09 MED ORDER — SULFAMETHOXAZOLE-TRIMETHOPRIM 800-160 MG PO TABS
1.0000 | ORAL_TABLET | Freq: Two times a day (BID) | ORAL | Status: AC
Start: 2015-04-09 — End: 2015-04-16

## 2015-04-09 MED ORDER — CEPHALEXIN 500 MG PO CAPS: 500.0000 mg | ORAL_CAPSULE | Freq: Two times a day (BID) | ORAL | Status: DC

## 2015-04-09 NOTE — ED Notes (Signed)
Pt c/o pain and swelling to right foot x 3-4 days. Pt ambulatory slowly with a cane. Pt denies injury. Pt attempted to get appointment with PMD but cannot get one till end of month.

## 2015-04-09 NOTE — ED Provider Notes (Signed)
CSN: [id number]     Arrival date & time 04/09/15  [id number] History   First MD Initiated Contact with Patient 04/09/15 1112     Chief Complaint  Patient presents with  . Foot Pain  . Foot Swelling    HPI   Kyle [name] is an 81 y.o. male with history of prostate cancer, HTN, HLD, stroke who presents to the ED for evaluation of right foot pain and swelling. He states 3-4 days ago he began noticing diffuse pain in his right foot with weight bearing. States that over the next few days he noticed swelling of his right ankle and foot. He has, over the past 2 days, noticed his right second toe in particular is swollen and red, with erythema spreading up the dorsum of his foot. Denies injury or trauma. He states the foot is not so painful now as it is swollen. Denies numbness, tingling, or weakness. Denies fever, chills. Pt is on eliquis, plavix, and ASA for triple anticoagulation therapy.  Past Medical History  Diagnosis Date  . History of prostate cancer   . Hyperlipidemia   . Hypertension   . Arthritis   . Impaired fasting glucose   . Detached retina     right  . H/O dizziness   . Urgency of urination   . Cancer [facility])     prostate CA  . GERD (gastroesophageal reflux disease)   . Stroke [facility]) ~ 1995-02/2009 X 2    /notes 08/22/2010  . Claustrophobia     "EXTREMELY" (03/02/2015)   Past Surgical History  Procedure Laterality Date  . Prostate surgery  1996    Kyle [name] 08/22/2010  . Tonsillectomy    . Colonoscopy w/ polypectomy    . Lumbar laminectomy/decompression microdiscectomy  01/24/2012    Procedure: LUMBAR LAMINECTOMY/DECOMPRESSION MICRODISCECTOMY 1 LEVEL;  Surgeon: Kyle Cunas, MD;  Location: [place] NEURO ORS;  Service: Neurosurgery;  Laterality: Right;  RIGHT Lumbar Three-Four far lateral diskectomy  . Eye surgery Right   . Retinal detachment repair w/ scleral buckle le Right 08/2006    Kyle [name] 08/22/2010  . Cardiac catheterization  ~ 1995    Kyle [name] 08/22/2010  . Hernia repair      hx  UHR/notes 08/22/2010  . Inguinal hernia repair Left     Kyle [name] 08/22/2010  . Umbilical hernia repair      hx/notes 08/22/2010  . Cardiac catheterization N/A 03/02/2015    Procedure: Left Heart Cath and Coronary Angiography;  Surgeon: Kyle Booze, MD;  Location: [place] CV LAB;  Service: Cardiovascular;  Laterality: N/A;  . Cardiac catheterization  03/02/2015    Procedure: Coronary Stent Intervention;  Surgeon: Kyle Booze, MD;  Location: [place] CV LAB;  Service: Cardiovascular;;   Family History  Problem Relation Age of Onset  . Heart disease Mother   . Coronary artery disease Mother   . Heart disease Father   . Coronary artery disease Father   . Heart disease Brother   . Diabetes Neg Hx   . Heart disease Brother   . Cancer Brother     prostate   Social History  Substance Use Topics  . Smoking status: Never Smoker   . Smokeless tobacco: Former Systems developer    Types: Chew    Quit date: 04/22/2004  . Alcohol Use: No    Review of Systems  All other systems reviewed and are negative.     Allergies  Simvastatin  Home Medications   Prior to Admission medications  Medication Sig Start Date End Date Taking? Authorizing Provider  apixaban (ELIQUIS) 2.5 MG TABS tablet Take 1 tablet (2.5 mg total) by mouth 2 (two) times daily. 03/22/15   Kyle Canales, PA-C  atorvastatin (LIPITOR) 80 MG tablet Take 1 tablet (80 mg total) by mouth daily at 6 PM. 03/22/15   Kyle Canales, PA-C  clopidogrel (PLAVIX) 75 MG tablet Take 1 tablet (75 mg total) by mouth daily. 03/03/15   Kyle Cousins, MD  ezetimibe (ZETIA) 10 MG tablet Take 1 tablet (10 mg total) by mouth daily. 03/22/15   Kyle Canales, PA-C  fish oil-omega-3 fatty acids 1000 MG capsule Take 1 g by mouth daily.    Historical Provider, MD  furosemide (LASIX) 20 MG tablet Take 1 tablet (20 mg total) by mouth daily. 03/22/15   Kyle Canales, PA-C  losartan (COZAAR) 25 MG tablet Take 1 tablet (25 mg total) by mouth  daily. 03/10/15   Kyle Erie Noe, PA-C  metoprolol tartrate (LOPRESSOR) 25 MG tablet Take 1 tablet (25 mg total) by mouth 2 (two) times daily. 03/22/15   Kyle Canales, PA-C  Red Yeast Rice 600 MG CAPS Take 1 capsule by mouth daily.     Historical Provider, MD  traMADol (ULTRAM) 50 MG tablet Take 1 tablet (50 mg total) by mouth 3 (three) times daily as needed. 04/03/15   Kyle Carbon, MD   BP 146/97 mmHg  Pulse 57  Temp(Src) 97.3 F (36.3 C) (Oral)  Resp 18  SpO2 99% Physical Exam  Constitutional: He is oriented to person, place, and time.  HENT:  Right Ear: External ear normal.  Left Ear: External ear normal.  Nose: Nose normal.  Mouth/Throat: Oropharynx is clear and moist. No oropharyngeal exudate.  Eyes: Conjunctivae and EOM are normal. Pupils are equal, round, and reactive to light.  Neck: Normal range of motion. Neck supple.  Cardiovascular: Normal rate, regular rhythm, normal heart sounds and intact distal pulses.   Pulmonary/Chest: Effort normal and breath sounds normal. No respiratory distress. He has no wheezes. He exhibits no tenderness.  Abdominal: Soft. Bowel sounds are normal. He exhibits no distension. There is no tenderness. There is no rebound and no guarding.  Musculoskeletal:  Right ankle and foot with 2+ pitting edema.  Intact DP and TP. Right second toe markedly erythematous, though nontender. Erythema spreading from second toe in 2cm radius up dorsum of foot.   Neurological: He is alert and oriented to person, place, and time. No cranial nerve deficit.  Skin: Skin is warm and dry.  Psychiatric: He has a normal mood and affect.  Nursing note and vitals reviewed.   ED Course  Procedures (including critical care time) Labs Review Labs Reviewed - No data to display  Imaging Review No results found. I have personally reviewed and evaluated these images and lab results as part of my medical decision-making.   EKG Interpretation None      MDM    Final diagnoses:  Cellulitis of right lower extremity   Likely cellulitis. Upon re-eval by attending Dr. Tyrone Nine, pt endorses cutting his right second toe a few days ago. The wound is not visible on my exam. Given his anticoagulation regimen, DVT is very unlikely. I also have low suspicion for gout or septic arthritis given how nontender pt is. Will prescribe bactrim and keflex. Pt instructed to f/u with PCP within two days for re-check. Strict ER return precautions given.   Anne Ng, PA-C 04/10/15 [phone]  Deno Etienne, DO 04/11/15 [phone]

## 2015-04-09 NOTE — Discharge Instructions (Signed)
You were seen in the emergency room today for right foot swelling and pain.  You likely have cellulitis, or an infection. Please take the antibiotics as prescribed. Please see your primary care doctor in 2 days for re-check. Return to the ER if your symptoms do not improve or get worse.    Cellulitis Cellulitis is an infection of the skin and the tissue beneath it. The infected area is usually red and tender. Cellulitis occurs most often in the arms and lower legs.  CAUSES  Cellulitis is caused by bacteria that enter the skin through cracks or cuts in the skin. The most common types of bacteria that cause cellulitis are staphylococci and streptococci. SIGNS AND SYMPTOMS   Redness and warmth.  Swelling.  Tenderness or pain.  Fever. DIAGNOSIS  Your health care provider can usually determine what is wrong based on a physical exam. Blood tests may also be done. TREATMENT  Treatment usually involves taking an antibiotic medicine. HOME CARE INSTRUCTIONS   Take your antibiotic medicine as directed by your health care provider. Finish the antibiotic even if you start to feel better.  Keep the infected arm or leg elevated to reduce swelling.  Apply a warm cloth to the affected area up to 4 times per day to relieve pain.  Take medicines only as directed by your health care provider.  Keep all follow-up visits as directed by your health care provider. SEEK MEDICAL CARE IF:   You notice red streaks coming from the infected area.  Your red area gets larger or turns dark in color.  Your bone or joint underneath the infected area becomes painful after the skin has healed.  Your infection returns in the same area or another area.  You notice a swollen bump in the infected area.  You develop new symptoms.  You have a fever. SEEK IMMEDIATE MEDICAL CARE IF:   You feel very sleepy.  You develop vomiting or diarrhea.  You have a general ill feeling (malaise) with muscle aches and  pains.   This information is not intended to replace advice given to you by your health care provider. Make sure you discuss any questions you have with your health care provider.   Document Released: 01/16/2005 Document Revised: 12/28/2014 Document Reviewed: 06/24/2011 Elsevier Interactive Patient Education [employer].

## 2015-04-11 ENCOUNTER — Telehealth: Payer: Self-pay | Admitting: Internal Medicine

## 2015-04-11 NOTE — Telephone Encounter (Signed)
    That sounds good    Please convert this to a phone note for the record    ----- Message -----     From: Camillia Herter     Sent: 04/11/2015  9:36 AM      To: Venia Carbon, MD        I spoke to patient. He said he has 3 more days of medication to take. The swelling is almost gone. He said he has a physical with you in 2 weeks. I asked him to call and schedule appointment if his foot isn't completely better.    Morey Hummingbird    ----- Message -----     From: Venia Carbon, MD     Sent: 04/10/2015  8:20 PM      To: Camillia Herter        Please make sure he comes in this week unless his foot is completely back to normal    ----- Message -----     From: SYSTEM     Sent: 04/09/2015 12:13 PM      To: Venia Carbon, MD

## 2015-04-13 ENCOUNTER — Other Ambulatory Visit: Payer: Self-pay | Admitting: Internal Medicine

## 2015-04-13 ENCOUNTER — Ambulatory Visit: Payer: Medicare Other

## 2015-04-13 DIAGNOSIS — I739 Peripheral vascular disease, unspecified: Secondary | ICD-10-CM | POA: Diagnosis not present

## 2015-04-19 DIAGNOSIS — I129 Hypertensive chronic kidney disease with stage 1 through stage 4 chronic kidney disease, or unspecified chronic kidney disease: Secondary | ICD-10-CM | POA: Diagnosis not present

## 2015-04-19 DIAGNOSIS — K219 Gastro-esophageal reflux disease without esophagitis: Secondary | ICD-10-CM | POA: Diagnosis not present

## 2015-04-19 DIAGNOSIS — E785 Hyperlipidemia, unspecified: Secondary | ICD-10-CM | POA: Diagnosis not present

## 2015-04-19 DIAGNOSIS — I4891 Unspecified atrial fibrillation: Secondary | ICD-10-CM | POA: Diagnosis not present

## 2015-04-19 DIAGNOSIS — N183 Chronic kidney disease, stage 3 (moderate): Secondary | ICD-10-CM | POA: Diagnosis not present

## 2015-04-19 DIAGNOSIS — I509 Heart failure, unspecified: Secondary | ICD-10-CM | POA: Diagnosis not present

## 2015-04-20 ENCOUNTER — Encounter: Payer: Self-pay | Admitting: *Deleted

## 2015-04-26 ENCOUNTER — Encounter: Payer: 59 | Admitting: Internal Medicine

## 2015-05-02 DIAGNOSIS — I129 Hypertensive chronic kidney disease with stage 1 through stage 4 chronic kidney disease, or unspecified chronic kidney disease: Secondary | ICD-10-CM | POA: Diagnosis not present

## 2015-05-02 DIAGNOSIS — N183 Chronic kidney disease, stage 3 (moderate): Secondary | ICD-10-CM | POA: Diagnosis not present

## 2015-05-02 DIAGNOSIS — I509 Heart failure, unspecified: Secondary | ICD-10-CM | POA: Diagnosis not present

## 2015-05-02 DIAGNOSIS — I4891 Unspecified atrial fibrillation: Secondary | ICD-10-CM | POA: Diagnosis not present

## 2015-05-02 DIAGNOSIS — K219 Gastro-esophageal reflux disease without esophagitis: Secondary | ICD-10-CM | POA: Diagnosis not present

## 2015-05-02 DIAGNOSIS — E785 Hyperlipidemia, unspecified: Secondary | ICD-10-CM | POA: Diagnosis not present

## 2015-05-05 ENCOUNTER — Encounter: Payer: Self-pay | Admitting: Internal Medicine

## 2015-05-05 ENCOUNTER — Ambulatory Visit (INDEPENDENT_AMBULATORY_CARE_PROVIDER_SITE_OTHER): Payer: Medicare Other | Admitting: Internal Medicine

## 2015-05-05 VITALS — BP 120/60 | HR 78 | Temp 98.2°F | Ht 72.0 in | Wt 189.0 lb

## 2015-05-05 DIAGNOSIS — I5022 Chronic systolic (congestive) heart failure: Secondary | ICD-10-CM

## 2015-05-05 DIAGNOSIS — G3184 Mild cognitive impairment, so stated: Secondary | ICD-10-CM

## 2015-05-05 DIAGNOSIS — I480 Paroxysmal atrial fibrillation: Secondary | ICD-10-CM

## 2015-05-05 DIAGNOSIS — I639 Cerebral infarction, unspecified: Secondary | ICD-10-CM | POA: Diagnosis not present

## 2015-05-05 DIAGNOSIS — Z0000 Encounter for general adult medical examination without abnormal findings: Secondary | ICD-10-CM

## 2015-05-05 DIAGNOSIS — I25119 Atherosclerotic heart disease of native coronary artery with unspecified angina pectoris: Secondary | ICD-10-CM | POA: Diagnosis not present

## 2015-05-05 DIAGNOSIS — N183 Chronic kidney disease, stage 3 unspecified: Secondary | ICD-10-CM

## 2015-05-05 MED ORDER — TRAMADOL HCL 50 MG PO TABS: 50.0000 mg | ORAL_TABLET | Freq: Three times a day (TID) | ORAL | Status: DC | PRN

## 2015-05-05 NOTE — Assessment & Plan Note (Signed)
I have personally reviewed the Medicare Annual Wellness questionnaire and have noted 1. The patient's medical and social history 2. Their use of alcohol, tobacco or illicit drugs 3. Their current medications and supplements 4. The patient's functional ability including ADL's, fall risks, home safety risks and hearing or visual             impairment. 5. Diet and physical activities 6. Evidence for depression or mood disorders  The patients weight, height, BMI and visual acuity have been recorded in the chart I have made referrals, counseling and provided education to the patient based review of the above and I have provided the pt with a written personalized care plan for preventive services.  I have provided you with a copy of your personalized plan for preventive services. Please take the time to review along with your updated medication list.  UTD on imms No cancer screening due to age

## 2015-05-05 NOTE — Progress Notes (Signed)
Subjective:    Patient ID: Kyle [name], male    DOB: 04-27-1931, 82 y.o.   MRN: [id number]  HPI Here for Medicare wellness and follow up of chronic medical conditions Reviewed form and advanced directives Reviewed other doctors-- no other doctors but the cardiologists.  No tobacco or alcohol Tries to walk regularly Reviewed multiple hospitalizations this year Hearing and vision are okay Had 1 fall-- just a slip. No injury Walks with cane--still drives Wife does most instrumental ADLs--but he can do them Mild mood issues--trouble getting around with heart (and snow). Not anhedonic  "I just don't have any energy" Can't sleep at night Not sure if it is due to meds No chest pain lately--but gives out easy. Easy DOE--?anginal equivalent. Declined vest to prevent SCD No palpitations Gets flares of toe pain--can feel his legs when walking but not really painful  Sleeps in bed Elevates with pillows but stable--now just 1 No PND  Mild balance issues still Uses cane No vertigo  Current Outpatient Prescriptions on File Prior to Visit  Medication Sig Dispense Refill  . apixaban (ELIQUIS) 2.5 MG TABS tablet Take 1 tablet (2.5 mg total) by mouth 2 (two) times daily. 60 tablet 5  . atorvastatin (LIPITOR) 80 MG tablet Take 1 tablet (80 mg total) by mouth daily at 6 PM. 30 tablet 11  . clopidogrel (PLAVIX) 75 MG tablet Take 1 tablet (75 mg total) by mouth daily. 30 tablet 0  . ezetimibe (ZETIA) 10 MG tablet Take 1 tablet (10 mg total) by mouth daily. 30 tablet 11  . fish oil-omega-3 fatty acids 1000 MG capsule Take 1 g by mouth daily.    . furosemide (LASIX) 20 MG tablet Take 1 tablet (20 mg total) by mouth daily. 30 tablet 11  . losartan (COZAAR) 25 MG tablet Take 1 tablet (25 mg total) by mouth daily. 30 tablet 9  . metoprolol tartrate (LOPRESSOR) 25 MG tablet Take 1 tablet (25 mg total) by mouth 2 (two) times daily. 60 tablet 11  . Red Yeast Rice 600 MG CAPS Take 1 capsule by mouth  daily.     . traMADol (ULTRAM) 50 MG tablet Take 1 tablet (50 mg total) by mouth 3 (three) times daily as needed. 90 tablet 0   No current facility-administered medications on file prior to visit.    Allergies  Allergen Reactions  . Simvastatin     REACTION: myalgias    Past Medical History  Diagnosis Date  . History of prostate cancer   . Hyperlipidemia   . Hypertension   . Arthritis   . Impaired fasting glucose   . Detached retina     right  . H/O dizziness   . Urgency of urination   . Cancer [facility])     prostate CA  . GERD (gastroesophageal reflux disease)   . Stroke [facility]) ~ 1995-02/2009 X 2    /notes 08/22/2010  . Claustrophobia     "EXTREMELY" (03/02/2015)    Past Surgical History  Procedure Laterality Date  . Prostate surgery  1996    Archie Endo 08/22/2010  . Tonsillectomy    . Colonoscopy w/ polypectomy    . Lumbar laminectomy/decompression microdiscectomy  01/24/2012    Procedure: LUMBAR LAMINECTOMY/DECOMPRESSION MICRODISCECTOMY 1 LEVEL;  Surgeon: Winfield Cunas, MD;  Location: [place] NEURO ORS;  Service: Neurosurgery;  Laterality: Right;  RIGHT Lumbar Three-Four far lateral diskectomy  . Eye surgery Right   . Retinal detachment repair w/ scleral buckle le Right 08/2006    /  notes 08/22/2010  . Cardiac catheterization  ~ 1995    Archie Endo 08/22/2010  . Hernia repair      hx UHR/notes 08/22/2010  . Inguinal hernia repair Left     Archie Endo 08/22/2010  . Umbilical hernia repair      hx/notes 08/22/2010  . Cardiac catheterization N/A 03/02/2015    Procedure: Left Heart Cath and Coronary Angiography;  Surgeon: Jettie Booze, MD;  Location: [place] CV LAB;  Service: Cardiovascular;  Laterality: N/A;  . Cardiac catheterization  03/02/2015    Procedure: Coronary Stent Intervention;  Surgeon: Jettie Booze, MD;  Location: [place] CV LAB;  Service: Cardiovascular;;    Family History  Problem Relation Age of Onset  . Heart disease Mother   . Coronary artery disease Mother    . Heart disease Father   . Coronary artery disease Father   . Heart disease Brother   . Diabetes Neg Hx   . Heart disease Brother   . Cancer Brother     prostate    Social History   Social History  . Marital Status: Married    Spouse Name: N/A  . Number of Children: 1  . Years of Education: N/A   Occupational History  . retired    Social History Main Topics  . Smoking status: Never Smoker   . Smokeless tobacco: Former Systems developer    Types: Chew    Quit date: 04/22/2004  . Alcohol Use: No  . Drug Use: No  . Sexual Activity: Not on file   Other Topics Concern  . Not on file   Social History Narrative   Has living will   Wife is health care POA   Would accept CPR but no prolonged machines.    Would not want a feeding tube if cognitively unaware            Review of Systems Right foot infection is much better---"but still not right" Appetite is good Weight is fairly stable-- up 4# over several months Bowels are okay Voids okay. No rash or suspicious skin lesions Some right ear pain    Objective:   Physical Exam  Constitutional: He is oriented to person, place, and time. He appears well-developed and well-nourished. No distress.  HENT:  Mouth/Throat: Oropharynx is clear and moist. No oropharyngeal exudate.  Neck: Normal range of motion. Neck supple. No thyromegaly present.  Cardiovascular: Normal rate, normal heart sounds and intact distal pulses.  Exam reveals no gallop.   No murmur heard. Irregular--hard to tell if a fib or regular with freq ectopy  Pulmonary/Chest: Breath sounds normal. No respiratory distress. He has no wheezes. He has no rales.  Abdominal: Soft. There is no tenderness.  Musculoskeletal: He exhibits no edema or tenderness.  Lymphadenopathy:    He has no cervical adenopathy.  Neurological: He is alert and oriented to person, place, and time.  President-- "Obama, Bush, Clinton" 100-92- "I'm no good at figures" D-o-r-l Recall 1/3  Skin: No  rash noted. No erythema.  Psychiatric: He has a normal mood and affect. His behavior is normal.          Assessment & Plan:

## 2015-05-05 NOTE — Assessment & Plan Note (Signed)
Seems stable No functional decline

## 2015-05-05 NOTE — Assessment & Plan Note (Signed)
Due for repeat echo Considering AICD

## 2015-05-05 NOTE — Assessment & Plan Note (Signed)
Stable creatinine On ARB

## 2015-05-05 NOTE — Assessment & Plan Note (Signed)
Mild balance issues persist

## 2015-05-05 NOTE — Progress Notes (Signed)
Pre visit review using our clinic review tool, if applicable. No additional management support is needed unless otherwise documented below in the visit note. 

## 2015-05-05 NOTE — Assessment & Plan Note (Signed)
DOE seems to be an anginal equivalent On appropriate regimen

## 2015-05-05 NOTE — Assessment & Plan Note (Signed)
Rate is fine On apixaban

## 2015-06-06 ENCOUNTER — Ambulatory Visit (HOSPITAL_COMMUNITY): Payer: Medicare Other | Attending: Cardiology

## 2015-06-06 ENCOUNTER — Other Ambulatory Visit: Payer: Self-pay | Admitting: *Deleted

## 2015-06-06 ENCOUNTER — Other Ambulatory Visit: Payer: Self-pay | Admitting: Physician Assistant

## 2015-06-06 ENCOUNTER — Other Ambulatory Visit: Payer: Self-pay

## 2015-06-06 DIAGNOSIS — I255 Ischemic cardiomyopathy: Secondary | ICD-10-CM

## 2015-06-06 DIAGNOSIS — Z8249 Family history of ischemic heart disease and other diseases of the circulatory system: Secondary | ICD-10-CM | POA: Insufficient documentation

## 2015-06-06 DIAGNOSIS — I071 Rheumatic tricuspid insufficiency: Secondary | ICD-10-CM | POA: Insufficient documentation

## 2015-06-06 DIAGNOSIS — I10 Essential (primary) hypertension: Secondary | ICD-10-CM | POA: Insufficient documentation

## 2015-06-06 DIAGNOSIS — I352 Nonrheumatic aortic (valve) stenosis with insufficiency: Secondary | ICD-10-CM | POA: Insufficient documentation

## 2015-06-06 DIAGNOSIS — I5189 Other ill-defined heart diseases: Secondary | ICD-10-CM | POA: Insufficient documentation

## 2015-06-06 DIAGNOSIS — Z87891 Personal history of nicotine dependence: Secondary | ICD-10-CM | POA: Diagnosis not present

## 2015-06-06 DIAGNOSIS — C61 Malignant neoplasm of prostate: Secondary | ICD-10-CM | POA: Diagnosis not present

## 2015-06-06 DIAGNOSIS — I340 Nonrheumatic mitral (valve) insufficiency: Secondary | ICD-10-CM | POA: Diagnosis not present

## 2015-06-06 DIAGNOSIS — I77810 Thoracic aortic ectasia: Secondary | ICD-10-CM | POA: Insufficient documentation

## 2015-06-06 DIAGNOSIS — I517 Cardiomegaly: Secondary | ICD-10-CM | POA: Diagnosis not present

## 2015-06-06 DIAGNOSIS — E785 Hyperlipidemia, unspecified: Secondary | ICD-10-CM | POA: Diagnosis not present

## 2015-06-06 MED ORDER — CLOPIDOGREL BISULFATE 75 MG PO TABS: 75.0000 mg | ORAL_TABLET | Freq: Every day | ORAL | Status: DC

## 2015-06-07 ENCOUNTER — Telehealth: Payer: Self-pay | Admitting: Cardiology

## 2015-06-07 NOTE — Telephone Encounter (Signed)
Returned call to listed number. Goes straight to voice message stating recipient not available. Cannot leave VM.

## 2015-06-07 NOTE — Telephone Encounter (Signed)
Follow Up   Pt returned call for results//Kyle [name]

## 2015-06-09 ENCOUNTER — Telehealth: Payer: Self-pay | Admitting: Physician Assistant

## 2015-06-09 NOTE — Telephone Encounter (Signed)
Pt has been made aware of his echo results. Pt verbalized understanding.

## 2015-06-09 NOTE — Telephone Encounter (Signed)
F/u  Pt returning RN phone call. Please call back and discuss.   

## 2015-06-13 ENCOUNTER — Encounter: Payer: Self-pay | Admitting: Cardiology

## 2015-06-13 ENCOUNTER — Ambulatory Visit (INDEPENDENT_AMBULATORY_CARE_PROVIDER_SITE_OTHER): Payer: Medicare Other | Admitting: Cardiology

## 2015-06-13 VITALS — BP 140/80 | HR 68 | Ht 72.0 in | Wt 195.6 lb

## 2015-06-13 DIAGNOSIS — Z79899 Other long term (current) drug therapy: Secondary | ICD-10-CM | POA: Diagnosis not present

## 2015-06-13 DIAGNOSIS — I5021 Acute systolic (congestive) heart failure: Secondary | ICD-10-CM | POA: Diagnosis not present

## 2015-06-13 DIAGNOSIS — I255 Ischemic cardiomyopathy: Secondary | ICD-10-CM

## 2015-06-13 MED ORDER — LOSARTAN POTASSIUM 50 MG PO TABS: 50.0000 mg | ORAL_TABLET | Freq: Every day | ORAL | Status: DC

## 2015-06-13 NOTE — Patient Instructions (Addendum)
Your physician has recommended you make the following change in your medication:   STOP FISH OIL, RED YEAST RICE AND ZETIA  INCREASE COZAAR TO 50 MG DAILY.  A NEW RX HAS BEEN SENT TO YOUR PHARMACY FOR THE NEW STRENGTH   Your physician recommends that you return for lab work in: [place]    Your physician recommends that you schedule a follow-up appointment in: [place] DR. HOCHREIN

## 2015-06-13 NOTE — Progress Notes (Signed)
Cardiology Office Note   Date:  06/13/2015   ID:  EVERET BATTERTON, DOB 11-06-1931, MRN [id number]  PCP:  Viviana Simpler, MD  Cardiologist:   Minus Breeding, MD   Chief Complaint  Patient presents with  . Coronary Artery Disease      History of Present Illness: JW VELASCO is a 82 y.o. male who presents for follow up of CAD.   The patient was at [facility] 02/27/15-03/03/15 for NSTEMI, new systolic CHF and new onset atrial fibrillation. 2D echo showed an EF of 15%. Subsequent LHC showed CAD with 75% stenosed mid LAD s/p PCI with stent plus diffuse disease in the left PDA with an 80% lesion in the proximal nondominant RCA. He was placed on triple therapy with ASA, Plavix and low dose Eliquis, given his newly placed stent and atrial fibrillation. His CHA2DS2 VASc score is 5.  ASA has since been stopped and he is on Plavix + Eliquis.   He refused a LifeVest.  He has been back to the clinic a couple of times since then.  He has had bradycardia which has complicated med titratoin.    He did have a follow up echo a couple of days ago and his EF is now 35 - 40%.  He is feeling relatively well.  He denies any chest pain, neck pain or SOB.  He is ambulating about 15 minutes a few times daily if the weather is OK.  He has no PND or orthopnea.  He has no palpitations, presyncope or syncope.    Past Medical History  Diagnosis Date  . History of prostate cancer   . Hyperlipidemia   . Hypertension   . Arthritis   . Impaired fasting glucose   . Detached retina     right  . H/O dizziness   . Urgency of urination   . Cancer [facility])     prostate CA  . GERD (gastroesophageal reflux disease)   . Stroke [facility]) ~ 1995-02/2009 X 2    /notes 08/22/2010  . Claustrophobia     "EXTREMELY" (03/02/2015)    Past Surgical History  Procedure Laterality Date  . Prostate surgery  1996    Archie Endo 08/22/2010  . Tonsillectomy    . Colonoscopy w/ polypectomy    . Lumbar laminectomy/decompression microdiscectomy  01/24/2012      Procedure: LUMBAR LAMINECTOMY/DECOMPRESSION MICRODISCECTOMY 1 LEVEL;  Surgeon: Winfield Cunas, MD;  Location: [place] NEURO ORS;  Service: Neurosurgery;  Laterality: Right;  RIGHT Lumbar Three-Four far lateral diskectomy  . Eye surgery Right   . Retinal detachment repair w/ scleral buckle le Right 08/2006    Archie Endo 08/22/2010  . Cardiac catheterization  ~ 1995    Archie Endo 08/22/2010  . Hernia repair      hx UHR/notes 08/22/2010  . Inguinal hernia repair Left     Archie Endo 08/22/2010  . Umbilical hernia repair      hx/notes 08/22/2010  . Cardiac catheterization N/A 03/02/2015    Procedure: Left Heart Cath and Coronary Angiography;  Surgeon: Jettie Booze, MD;  Location: [place] CV LAB;  Service: Cardiovascular;  Laterality: N/A;  . Cardiac catheterization  03/02/2015    Procedure: Coronary Stent Intervention;  Surgeon: Jettie Booze, MD;  Location: [place] CV LAB;  Service: Cardiovascular;;     Current Outpatient Prescriptions  Medication Sig Dispense Refill  . apixaban (ELIQUIS) 2.5 MG TABS tablet Take 1 tablet (2.5 mg total) by mouth 2 (two) times daily. 60 tablet 5  .  atorvastatin (LIPITOR) 80 MG tablet Take 1 tablet (80 mg total) by mouth daily at 6 PM. 30 tablet 11  . clopidogrel (PLAVIX) 75 MG tablet Take 1 tablet (75 mg total) by mouth daily. 30 tablet 0  . furosemide (LASIX) 20 MG tablet Take 1 tablet (20 mg total) by mouth daily. 30 tablet 11  . losartan (COZAAR) 50 MG tablet Take 1 tablet (50 mg total) by mouth daily. 30 tablet 2  . metoprolol tartrate (LOPRESSOR) 25 MG tablet Take 1 tablet (25 mg total) by mouth 2 (two) times daily. 60 tablet 11  . traMADol (ULTRAM) 50 MG tablet Take 1 tablet (50 mg total) by mouth 3 (three) times daily as needed. 90 tablet 0   No current facility-administered medications for this visit.    Allergies:   Simvastatin     ROS:  Please see the history of present illness.   Otherwise, review of systems are positive for none.   All other  systems are reviewed and negative.    PHYSICAL EXAM: VS:  BP 140/80 mmHg  Pulse 68  Ht 6' (1.829 m)  Wt 195 lb 9 oz (88.707 kg)  BMI 26.52 kg/m2 , BMI Body mass index is 26.52 kg/(m^2). GENERAL:  Well appearing HEENT:  Pupils equal round and reactive, fundi not visualized, oral mucosa unremarkable NECK:  No jugular venous distention, waveform within normal limits, carotid upstroke brisk and symmetric, no bruits, no thyromegaly LYMPHATICS:  No cervical, inguinal adenopathy LUNGS:  Clear to auscultation bilaterally BACK:  No CVA tenderness CHEST:  Unremarkable HEART:  PMI not displaced or sustained,S1 and S2 within normal limits, no S3, no S4, no clicks, no rubs, no murmurs ABD:  Flat, positive bowel sounds normal in frequency in pitch, no bruits, no rebound, no guarding, no midline pulsatile mass, no hepatomegaly, no splenomegaly EXT:  2 plus pulses throughout, no edema, no cyanosis no clubbing SKIN:  No rashes no nodules NEURO:  Cranial nerves II through XII grossly intact, motor grossly intact throughout PSYCH:  Cognitively intact, oriented to person place and time    EKG:  EKG is ordered today. The ekg ordered today demonstrates NSR, rate 68, LAD, PVCs, no acute ST T wave changes.    Recent Labs: 02/27/2015: B Natriuretic Peptide 1011.3*; TSH 3.319 03/03/2015: BUN 24*; Creatinine, Ser 1.48*; Hemoglobin 13.8; Magnesium 2.0; Platelets 238; Potassium 3.3*; Sodium 140    Lipid Panel    Component Value Date/Time   CHOL 194 03/01/2015 0327   TRIG 63 03/01/2015 0327   TRIG 119 05/01/2006 1043   HDL 53 03/01/2015 0327   CHOLHDL 3.7 03/01/2015 0327   CHOLHDL 3.2 CALC 05/01/2006 1043   VLDL 13 03/01/2015 0327   LDLCALC 128* 03/01/2015 0327   LDLDIRECT 164.4 10/10/2010 1012      Wt Readings from Last 3 Encounters:  06/13/15 195 lb 9 oz (88.707 kg)  05/05/15 189 lb (85.73 kg)  04/03/15 186 lb (84.369 kg)      Other studies Reviewed: Additional studies/ records that were  reviewed today include: Hospital records. Review of the above records demonstrates:  Please see elsewhere in the note.     ASSESSMENT AND PLAN:  CAD:  The patient has no ongoing angina.  He will continue with med changes as below.  Continue Plavix x 1 year.   ISCHEMIC CARDIOMYOPATHY:  His EF is improved.  He does not want an ICD but does not need one at this point.  I am going to try to continue  to titrate meds and I will increase his Cozaar to 50 mg po daily.  ATRIAL FIB:  He had this and needs meds as above.  He is at the target dose of Eliquis given his creat that is at or above 1.5 and his age.    DYSLIPIDEMIA:  He will stop the Zetia, omega 3 fatty acids, and red yeast rice.    Current medicines are reviewed at length with the patient today.  The patient does not have concerns regarding medicines.  The following changes have been made:  As above  Labs/ tests ordered today include:   Orders Placed This Encounter  Procedures  . CBC  . Basic metabolic panel     Disposition:   FU with me in one month.     Signed, Minus Breeding, MD  06/13/2015 5:39 PM    [place]

## 2015-06-14 LAB — CBC
HCT: 43.9 % (ref 39.0–52.0)
Hemoglobin: 13.8 g/dL (ref 13.0–17.0)
MCH: 29.9 pg (ref 26.0–34.0)
MCHC: 31.4 g/dL (ref 30.0–36.0)
MCV: 95 fL (ref 78.0–100.0)
MPV: 11 fL (ref 8.6–12.4)
Platelets: 239 10*3/uL (ref 150–400)
RBC: 4.62 MIL/uL (ref 4.22–5.81)
RDW: 14.7 % (ref 11.5–15.5)
WBC: 8.3 10*3/uL (ref 4.0–10.5)

## 2015-06-14 LAB — BASIC METABOLIC PANEL
BUN: 16 mg/dL (ref 7–25)
CO2: 28 mmol/L (ref 20–31)
Calcium: 9.2 mg/dL (ref 8.6–10.3)
Chloride: 105 mmol/L (ref 98–110)
Creat: 1.52 mg/dL — ABNORMAL HIGH (ref 0.70–1.11)
Glucose, Bld: 94 mg/dL (ref 65–99)
Potassium: 4.3 mmol/L (ref 3.5–5.3)
Sodium: 144 mmol/L (ref 135–146)

## 2015-06-19 ENCOUNTER — Telehealth: Payer: Self-pay | Admitting: *Deleted

## 2015-06-19 NOTE — Telephone Encounter (Signed)
Pt has been notified of lab results and findings by phone with verbal understanding. I will fax results to PCP.

## 2015-06-20 ENCOUNTER — Other Ambulatory Visit: Payer: Self-pay | Admitting: Internal Medicine

## 2015-06-20 ENCOUNTER — Other Ambulatory Visit: Payer: Self-pay | Admitting: Cardiology

## 2015-06-20 MED ORDER — CLOPIDOGREL BISULFATE 75 MG PO TABS: 75.0000 mg | ORAL_TABLET | Freq: Every day | ORAL | Status: DC

## 2015-06-20 NOTE — Telephone Encounter (Signed)
rx called into pharmacy

## 2015-06-20 NOTE — Telephone Encounter (Signed)
05/05/2015 

## 2015-06-20 NOTE — Telephone Encounter (Signed)
Approved: #90 x 0 

## 2015-06-21 NOTE — Addendum Note (Signed)
Addended by: Diana Eves on: 06/21/2015 11:33 AM   Modules accepted: Orders

## 2015-07-17 ENCOUNTER — Other Ambulatory Visit: Payer: Self-pay | Admitting: Cardiology

## 2015-07-20 ENCOUNTER — Encounter: Payer: Medicare Other | Attending: Internal Medicine | Admitting: *Deleted

## 2015-07-20 VITALS — Ht 70.9 in | Wt 196.8 lb

## 2015-07-20 DIAGNOSIS — Z9861 Coronary angioplasty status: Secondary | ICD-10-CM

## 2015-07-20 DIAGNOSIS — I214 Non-ST elevation (NSTEMI) myocardial infarction: Secondary | ICD-10-CM | POA: Diagnosis not present

## 2015-07-20 NOTE — Progress Notes (Signed)
Daily Session Note  Patient Details  Name: Kyle [name] MRN: [id number] Date of Birth: 03/02/1932 Referring Provider:  Hilty, Kenneth C, MD  Encounter Date: 07/20/2015  Check In:     Session Check In - 07/20/15 1253    Check-In   Location [facility]   Staff Present  , RN, BSN;Steven Way, BS, ACSM EP-C, Exercise Physiologist   Medication changes reported     No   Fall or balance concerns reported    Yes   Warm-up and Cool-down Not performed (comment)   Resistance Training Performed No   VAD Patient? No   Pain Assessment   Currently in Pain? No/denies         Goals Met:  Proper associated with RPD/PD & O2 Sat Exercise tolerated well Personal goals reviewed  Goals Unmet:  Not Applicable  Comments: [name] uses his walker at times so I encouraged him and got him to use his walker with his 6 minute walk in the hall.    Dr. Mark Miller is Medical Director for [facility] and [facility]. 

## 2015-07-20 NOTE — Progress Notes (Signed)
Cardiac Individual Treatment Plan  Patient Details  Name: Kyle [name] MRN: [id number] Date of Birth: March 28, 1932 Referring Provider:  Pixie Casino, MD  Initial Encounter Date:   Visit Diagnosis: NSTEMI (non-ST elevated myocardial infarction) ([place])  S/P PTCA (percutaneous transluminal coronary angioplasty)  Patient's Home Medications on Admission:  Current outpatient prescriptions:  .  apixaban (ELIQUIS) 2.5 MG TABS tablet, Take 1 tablet (2.5 mg total) by mouth 2 (two) times daily., Disp: 60 tablet, Rfl: 5 .  atorvastatin (LIPITOR) 80 MG tablet, Take 1 tablet (80 mg total) by mouth daily at 6 PM., Disp: 30 tablet, Rfl: 11 .  clopidogrel (PLAVIX) 75 MG tablet, TAKE 1 TABLET BY MOUTH DAILY, Disp: 30 tablet, Rfl: 11 .  furosemide (LASIX) 20 MG tablet, Take 1 tablet (20 mg total) by mouth daily., Disp: 30 tablet, Rfl: 11 .  losartan (COZAAR) 50 MG tablet, Take 1 tablet (50 mg total) by mouth daily., Disp: 30 tablet, Rfl: 2 .  metoprolol tartrate (LOPRESSOR) 25 MG tablet, Take 1 tablet (25 mg total) by mouth 2 (two) times daily., Disp: 60 tablet, Rfl: 11 .  traMADol (ULTRAM) 50 MG tablet, TAKE ONE TABLET 3 TIMES DAILY AS NEEDED, Disp: 90 tablet, Rfl: 0  Past Medical History: Past Medical History  Diagnosis Date  . History of prostate cancer   . Hyperlipidemia   . Hypertension   . Arthritis   . Impaired fasting glucose   . Detached retina     right  . H/O dizziness   . Urgency of urination   . Cancer [facility])     prostate CA  . GERD (gastroesophageal reflux disease)   . Stroke [facility]) ~ 1995-02/2009 X 2    /notes 08/22/2010  . Claustrophobia     "EXTREMELY" (03/02/2015)    Tobacco Use: History  Smoking status  . Never Smoker   Smokeless tobacco  . Former Systems developer  . Types: Chew  . Quit date: 04/22/2004    Labs: Recent Review Flowsheet Data    Labs for ITP Cardiac and Pulmonary Rehab Latest Ref Rng 10/10/2010 04/16/2013 04/20/2014 05/10/2014 03/01/2015   Cholestrol 0 - 200  mg/dL 237(H) 194 222(H) - 194   LDLCALC 0 - 99 mg/dL - 128(H) 145(H) - 128(H)   LDLDIRECT - 164.4 - - - -   HDL >40 mg/dL 52.90 44.40 49.50 - 53   Trlycerides <150 mg/dL 182.0(H) 110.0 138.0 - 63   Hemoglobin A1c <5.7 % 6.7(H) - - 6.1(H) -       Exercise Target Goals:    Exercise Program Goal: Individual exercise prescription set with THRR, safety & activity barriers. Participant demonstrates ability to understand and report RPE using BORG scale, to self-measure pulse accurately, and to acknowledge the importance of the exercise prescription.  Exercise Prescription Goal: Starting with aerobic activity 30 plus minutes a day, 3 days per week for initial exercise prescription. Provide home exercise prescription and guidelines that participant acknowledges understanding prior to discharge.  Activity Barriers & Risk Stratification:     Activity Barriers & Cardiac Risk Stratification - 07/20/15 1253    Activity Barriers & Cardiac Risk Stratification   Activity Barriers Arthritis;Joint Problems;Shortness of Breath;Assistive Device   Cardiac Risk Stratification High      6 Minute Walk:     6 Minute Walk      07/20/15 1424       6 Minute Walk   Phase Initial     Distance 860 feet     Walk Time  6 minutes     RPE 15     Symptoms Yes (comment)  Kyle [name] c/o being light headed dizzy at the end of his 6 min walk -his blood pressure was 120/60 and no new ectopy.     Resting HR 62 bpm     Resting BP 138/60 mmHg     Max Ex. HR 92 bpm     Max Ex. BP 120/60 mmHg        Initial Exercise Prescription:   Perform Capillary Blood Glucose checks as needed.  Exercise Prescription Changes:   Exercise Comments:   Discharge Exercise Prescription (Final Exercise Prescription Changes):   Nutrition:  Target Goals: Understanding of nutrition guidelines, daily intake of sodium 1500mg , cholesterol 200mg , calories 30% from fat and 7% or less from saturated fats, daily to have 5 or more  servings of fruits and vegetables.  Biometrics:     Pre Biometrics - 07/20/15 1426    Pre Biometrics   Height 5' 10.9" (1.801 m)   Weight 196 lb 12.8 oz (89.268 kg)   Waist Circumference 41 inches   Hip Circumference 40.5 inches   Waist to Hip Ratio 1.01 %   BMI (Calculated) 27.6       Nutrition Therapy Plan and Nutrition Goals:     Nutrition Therapy & Goals - 07/20/15 1257    Nutrition Therapy   Drug/Food Interactions Statins/Certain Fruits   Intervention Plan   Intervention Prescribe, educate and counsel regarding individualized specific dietary modifications aiming towards targeted core components such as weight, hypertension, lipid management, diabetes, heart failure and other comorbidities.   Expected Outcomes Short Term Goal: Understand basic principles of dietary content, such as calories, fat, sodium, cholesterol and nutrients.;Long Term Goal: Adherence to prescribed nutrition plan.      Nutrition Discharge: Rate Your Plate Scores:   Nutrition Goals Re-Evaluation:   Psychosocial: Target Goals: Acknowledge presence or absence of depression, maximize coping skills, provide positive support system. Participant is able to verbalize types and ability to use techniques and skills needed for reducing stress and depression.  Initial Review & Psychosocial Screening:     Initial Psych Review & Screening - 07/20/15 [place]? Yes   Barriers   Psychosocial barriers to participate in program There are no identifiable barriers or psychosocial needs.      Quality of Life Scores:     Quality of Life - 07/20/15 1423    Quality of Life Scores   Health/Function Pre 16.4 %   Socioeconomic Pre 21 %   Psych/Spiritual Pre 18 %   Family Pre 22.8 %   GLOBAL Pre 18.55 %      PHQ-9:     Recent Review Flowsheet Data    Depression screen [facility] 2/9 07/20/2015 05/05/2015 04/20/2014 04/16/2013 04/01/2012   Decreased Interest 3 0 0 0 0    Down, Depressed, Hopeless 3 1 0 0 1   PHQ - 2 Score 6 1 0 0 1   Altered sleeping 2 - - - -   Tired, decreased energy 3 - - - -   Change in appetite 0 - - - -   Feeling bad or failure about yourself  3 - - - -   Trouble concentrating 2 - - - -   Moving slowly or fidgety/restless 3 - - - -   Suicidal thoughts 0 - - - -   PHQ-9 Score 19 - - - -   Difficult doing  work/chores Somewhat difficult - - - -      Psychosocial Evaluation and Intervention:   Psychosocial Re-Evaluation:   Vocational Rehabilitation: Provide vocational rehab assistance to qualifying candidates.   Vocational Rehab Evaluation & Intervention:     Vocational Rehab - 07/20/15 1253    Initial Vocational Rehab Evaluation & Intervention   Assessment shows need for Vocational Rehabilitation No      Education: Education Goals: Education classes will be provided on a weekly basis, covering required topics. Participant will state understanding/return demonstration of topics presented.  Learning Barriers/Preferences:     Learning Barriers/Preferences - 07/20/15 1253    Learning Barriers/Preferences   Learning Barriers Sight   Learning Preferences Individual Instruction      Education Topics: General Nutrition Guidelines/Fats and Fiber: -Group instruction provided by verbal, written material, models and posters to present the general guidelines for heart healthy nutrition. Gives an explanation and review of dietary fats and fiber.   Controlling Sodium/Reading Food Labels: -Group verbal and written material supporting the discussion of sodium use in heart healthy nutrition. Review and explanation with models, verbal and written materials for utilization of the food label.   Exercise Physiology & Risk Factors: - Group verbal and written instruction with models to review the exercise physiology of the cardiovascular system and associated critical values. Details cardiovascular disease risk factors and the goals  associated with each risk factor.   Aerobic Exercise & Resistance Training: - Gives group verbal and written discussion on the health impact of inactivity. On the components of aerobic and resistive training programs and the benefits of this training and how to safely progress through these programs.   Flexibility, Balance, General Exercise Guidelines: - Provides group verbal and written instruction on the benefits of flexibility and balance training programs. Provides general exercise guidelines with specific guidelines to those with heart or lung disease. Demonstration and skill practice provided.   Stress Management: - Provides group verbal and written instruction about the health risks of elevated stress, cause of high stress, and healthy ways to reduce stress.   Depression: - Provides group verbal and written instruction on the correlation between heart/lung disease and depressed mood, treatment options, and the stigmas associated with seeking treatment.   Anatomy & Physiology of the Heart: - Group verbal and written instruction and models provide basic cardiac anatomy and physiology, with the coronary electrical and arterial systems. Review of: AMI, Angina, Valve disease, Heart Failure, Cardiac Arrhythmia, Pacemakers, and the ICD.   Cardiac Procedures: - Group verbal and written instruction and models to describe the testing methods done to diagnose heart disease. Reviews the outcomes of the test results. Describes the treatment choices: Medical Management, Angioplasty, or Coronary Bypass Surgery.   Cardiac Medications: - Group verbal and written instruction to review commonly prescribed medications for heart disease. Reviews the medication, class of the drug, and side effects. Includes the steps to properly store meds and maintain the prescription regimen.   Go Sex-Intimacy & Heart Disease, Get SMART - Goal Setting: - Group verbal and written instruction through game format to  discuss heart disease and the return to sexual intimacy. Provides group verbal and written material to discuss and apply goal setting through the application of the S.M.A.R.T. Method.   Other Matters of the Heart: - Provides group verbal, written materials and models to describe Heart Failure, Angina, Valve Disease, and Diabetes in the realm of heart disease. Includes description of the disease process and treatment options available to the cardiac patient.  Exercise & Equipment Safety: - Individual verbal instruction and demonstration of equipment use and safety with use of the equipment.          Cardiac Rehab from 07/20/2015 in [facility] Cardiac and Pulmonary Rehab   Date  07/20/15   Educator  C. Juanmiguel Defelice, RN   Instruction Review Code  1- partially meets, needs review/practice      Infection Prevention: - Provides verbal and written material to individual with discussion of infection control including proper hand washing and proper equipment cleaning during exercise session.      Cardiac Rehab from 07/20/2015 in [facility] Cardiac and Pulmonary Rehab   Date  07/20/15   Educator  C. EnterkinRN   Instruction Review Code  2- meets goals/outcomes      Falls Prevention: - Provides verbal and written material to individual with discussion of falls prevention and safety.      Cardiac Rehab from 07/20/2015 in [facility] Cardiac and Pulmonary Rehab   Date  07/20/15   Educator  C. [place]   Instruction Review Code  1- partially meets, needs review/practice      Diabetes: - Individual verbal and written instruction to review signs/symptoms of diabetes, desired ranges of glucose level fasting, after meals and with exercise. Advice that pre and post exercise glucose checks will be done for 3 sessions at entry of program.    Knowledge Questionnaire Score:     Knowledge Questionnaire Score - 07/20/15 1427    Knowledge Questionnaire Score   Pre Score 1  Sidak said he did not know many answers and  he only answered one correctly.       Core Components/Risk Factors/Patient Goals at Admission:     Personal Goals and Risk Factors at Admission - 07/20/15 1428    Core Components/Risk Factors/Patient Goals on Admission   Sedentary Yes   Intervention Provide advice, education, support and counseling about physical activity/exercise needs.;Develop an individualized exercise prescription for aerobic and resistive training based on initial evaluation findings, risk stratification, comorbidities and participant's personal goals.   Expected Outcomes Achievement of increased cardiorespiratory fitness and enhanced flexibility, muscular endurance and strength shown through measurements of functional capacity and personal statement of participant.      Core Components/Risk Factors/Patient Goals Review:    Core Components/Risk Factors/Patient Goals at Discharge (Final Review):    ITP Comments:   Comments:  Nirvaan uses his walker at times so I encouraged him and got him to use his walker with his 6 minute walk in the hall.

## 2015-07-23 NOTE — Progress Notes (Signed)
Cardiology Office Note   Date:  07/24/2015   ID:  Kyle [name], DOB 1931/07/25, MRN [id number]  PCP:  Viviana Simpler, MD  Cardiologist:   Minus Breeding, MD   Chief Complaint  Patient presents with  . Coronary Artery Disease      History of Present Illness: Kyle [name] is a 82 y.o. male who presents for follow up of CAD.   The patient was at [facility] 02/27/15-03/03/15 for NSTEMI, new systolic CHF and new onset atrial fibrillation. 2D echo showed an EF of 15%. Subsequent LHC showed CAD with 75% stenosed mid LAD s/p PCI with stent plus diffuse disease in the left PDA with an 80% lesion in the proximal nondominant RCA. He was placed on triple therapy with ASA, Plavix and low dose Eliquis, given his newly placed stent and atrial fibrillation. His CHA2DS2 VASc score is 5.  ASA has since been stopped and he is on Plavix + Eliquis.   He refused a LifeVest.    He did have a follow up echo  and his EF is now 35 - 40%.  At the last appt I increased his Cozaar.  He had no problems with this although he would like to take less medication.  He is feeling well.  He denies any chest pain, neck pain or SOB.  He is going to start rehab on Monday!    He has no PND or orthopnea.  He has no palpitations, presyncope or syncope.     Past Medical History  Diagnosis Date  . History of prostate cancer   . Hyperlipidemia   . Hypertension   . Arthritis   . Impaired fasting glucose   . Detached retina     right  . H/O dizziness   . Urgency of urination   . Cancer [facility])     prostate CA  . GERD (gastroesophageal reflux disease)   . Stroke [facility]) ~ 1995-02/2009 X 2    /notes 08/22/2010  . Claustrophobia     "EXTREMELY" (03/02/2015)    Past Surgical History  Procedure Laterality Date  . Prostate surgery  1996    Archie Endo 08/22/2010  . Tonsillectomy    . Colonoscopy w/ polypectomy    . Lumbar laminectomy/decompression microdiscectomy  01/24/2012    Procedure: LUMBAR LAMINECTOMY/DECOMPRESSION MICRODISCECTOMY 1  LEVEL;  Surgeon: Winfield Cunas, MD;  Location: [place] NEURO ORS;  Service: Neurosurgery;  Laterality: Right;  RIGHT Lumbar Three-Four far lateral diskectomy  . Eye surgery Right   . Retinal detachment repair w/ scleral buckle le Right 08/2006    Archie Endo 08/22/2010  . Cardiac catheterization  ~ 1995    Archie Endo 08/22/2010  . Hernia repair      hx UHR/notes 08/22/2010  . Inguinal hernia repair Left     Archie Endo 08/22/2010  . Umbilical hernia repair      hx/notes 08/22/2010  . Cardiac catheterization N/A 03/02/2015    Procedure: Left Heart Cath and Coronary Angiography;  Surgeon: Jettie Booze, MD;  Location: [place] CV LAB;  Service: Cardiovascular;  Laterality: N/A;  . Cardiac catheterization  03/02/2015    Procedure: Coronary Stent Intervention;  Surgeon: Jettie Booze, MD;  Location: [place] CV LAB;  Service: Cardiovascular;;     Current Outpatient Prescriptions  Medication Sig Dispense Refill  . apixaban (ELIQUIS) 2.5 MG TABS tablet Take 1 tablet (2.5 mg total) by mouth 2 (two) times daily. 60 tablet 5  . atorvastatin (LIPITOR) 80 MG tablet Take 1 tablet (  80 mg total) by mouth daily at 6 PM. 30 tablet 11  . clopidogrel (PLAVIX) 75 MG tablet TAKE 1 TABLET BY MOUTH DAILY 30 tablet 11  . furosemide (LASIX) 20 MG tablet Take 1 tablet (20 mg total) by mouth daily. 30 tablet 11  . losartan (COZAAR) 50 MG tablet Take 1 tablet (50 mg total) by mouth daily. 30 tablet 2  . metoprolol tartrate (LOPRESSOR) 25 MG tablet Take 1 tablet (25 mg total) by mouth 2 (two) times daily. 60 tablet 11  . traMADol (ULTRAM) 50 MG tablet TAKE ONE TABLET 3 TIMES DAILY AS NEEDED 90 tablet 0   No current facility-administered medications for this visit.    Allergies:   Simvastatin     ROS:  Please see the history of present illness.   Otherwise, review of systems are positive for none.   All other systems are reviewed and negative.    PHYSICAL EXAM: VS:  BP 140/82 mmHg  Pulse 60  Ht 5' 10.9" (1.801 m)   Wt 198 lb (89.812 kg)  BMI 27.69 kg/m2 , BMI Body mass index is 27.69 kg/(m^2). GENERAL:  Well appearing HEENT:  Pupils equal round and reactive, fundi not visualized, oral mucosa unremarkable NECK:  No jugular venous distention, waveform within normal limits, carotid upstroke brisk and symmetric, no bruits, no thyromegaly LYMPHATICS:  No cervical, inguinal adenopathy LUNGS:  Clear to auscultation bilaterally BACK:  No CVA tenderness CHEST:  Unremarkable HEART:  PMI not displaced or sustained,S1 and S2 within normal limits, no S3, no S4, no clicks, no rubs, no murmurs ABD:  Flat, positive bowel sounds normal in frequency in pitch, no bruits, no rebound, no guarding, no midline pulsatile mass, no hepatomegaly, no splenomegaly EXT:  2 plus pulses throughout, no edema, no cyanosis no clubbing SKIN:  No rashes no nodules NEURO:  Cranial nerves II through XII grossly intact, motor grossly intact throughout PSYCH:  Cognitively intact, oriented to person place and time    EKG:  EKG is not ordered today.    Recent Labs: 02/27/2015: B Natriuretic Peptide 1011.3*; TSH 3.319 03/03/2015: Magnesium 2.0 06/13/2015: BUN 16; Creat 1.52*; Hemoglobin 13.8; Platelets 239; Potassium 4.3; Sodium 144    Lipid Panel    Component Value Date/Time   CHOL 194 03/01/2015 0327   TRIG 63 03/01/2015 0327   TRIG 119 05/01/2006 1043   HDL 53 03/01/2015 0327   CHOLHDL 3.7 03/01/2015 0327   CHOLHDL 3.2 CALC 05/01/2006 1043   VLDL 13 03/01/2015 0327   LDLCALC 128* 03/01/2015 0327   LDLDIRECT 164.4 10/10/2010 1012      Wt Readings from Last 3 Encounters:  07/24/15 198 lb (89.812 kg)  07/20/15 196 lb 12.8 oz (89.268 kg)  06/13/15 195 lb 9 oz (88.707 kg)      Other studies Reviewed: Additional studies/ records that were reviewed today include: Hospital records. Review of the above records demonstrates:  Please see elsewhere in the note.     ASSESSMENT AND PLAN:  CAD:  The patient has no ongoing  angina.  He will continue with med changes as below.  Continue Plavix x 1 year. I will see him in Nov to stop this.  ISCHEMIC CARDIOMYOPATHY:  His EF is improved.  He does not want an ICD but does not need one at this point.  Continue the current meds.   He does not want to take more medications.  I will try to pursue med titration again in the future if he will allow.  ATRIAL FIB:   He is at the target dose of Eliquis given his creat that is at or above 1.5 and his age.    DYSLIPIDEMIA:  I will have him come back for a lipid profile.    Current medicines are reviewed at length with the patient today.  The patient does not have concerns regarding medicines.  The following changes have been made:  None   Labs/ tests ordered today include:   Orders Placed This Encounter  Procedures  . Lipid Profile  . Hepatic function panel     Disposition:   FU with me in Nov.     Signed, Minus Breeding, MD  07/24/2015 11:07 AM    [place]

## 2015-07-24 ENCOUNTER — Encounter: Payer: Self-pay | Admitting: *Deleted

## 2015-07-24 ENCOUNTER — Encounter: Payer: Self-pay | Admitting: Cardiology

## 2015-07-24 ENCOUNTER — Ambulatory Visit (INDEPENDENT_AMBULATORY_CARE_PROVIDER_SITE_OTHER): Payer: Medicare Other | Admitting: Cardiology

## 2015-07-24 VITALS — BP 140/82 | HR 60 | Ht 70.9 in | Wt 198.0 lb

## 2015-07-24 DIAGNOSIS — Z961 Presence of intraocular lens: Secondary | ICD-10-CM | POA: Insufficient documentation

## 2015-07-24 DIAGNOSIS — E785 Hyperlipidemia, unspecified: Secondary | ICD-10-CM | POA: Diagnosis not present

## 2015-07-24 DIAGNOSIS — I255 Ischemic cardiomyopathy: Secondary | ICD-10-CM

## 2015-07-24 DIAGNOSIS — H3520 Other non-diabetic proliferative retinopathy, unspecified eye: Secondary | ICD-10-CM | POA: Insufficient documentation

## 2015-07-24 DIAGNOSIS — Z8546 Personal history of malignant neoplasm of prostate: Secondary | ICD-10-CM | POA: Insufficient documentation

## 2015-07-24 DIAGNOSIS — I639 Cerebral infarction, unspecified: Secondary | ICD-10-CM | POA: Insufficient documentation

## 2015-07-24 DIAGNOSIS — I10 Essential (primary) hypertension: Secondary | ICD-10-CM | POA: Insufficient documentation

## 2015-07-24 DIAGNOSIS — E7800 Pure hypercholesterolemia, unspecified: Secondary | ICD-10-CM | POA: Insufficient documentation

## 2015-07-24 DIAGNOSIS — H33009 Unspecified retinal detachment with retinal break, unspecified eye: Secondary | ICD-10-CM | POA: Insufficient documentation

## 2015-07-24 DIAGNOSIS — Z79899 Other long term (current) drug therapy: Secondary | ICD-10-CM | POA: Diagnosis not present

## 2015-07-24 NOTE — Patient Instructions (Addendum)
Your physician recommends that you schedule a follow-up appointment in: November  Your physician recommends that you return for lab work in: Fasting Lipids Liver

## 2015-07-26 ENCOUNTER — Encounter: Payer: Medicare Other | Attending: Internal Medicine | Admitting: *Deleted

## 2015-07-26 DIAGNOSIS — I214 Non-ST elevation (NSTEMI) myocardial infarction: Secondary | ICD-10-CM | POA: Diagnosis not present

## 2015-07-26 DIAGNOSIS — Z9861 Coronary angioplasty status: Secondary | ICD-10-CM

## 2015-07-26 NOTE — Progress Notes (Signed)
Daily Session Note  Patient Details  Name: MAK BONNY MRN: [id number] Date of Birth: 12/21/1931 Referring Provider:  Pixie Casino, MD  Encounter Date: 07/26/2015  Check In:     Session Check In - 07/26/15 4432    Check-In   Location [facility]-Cardiac & Pulmonary Rehab   Staff Present Nyoka Cowden, RN;Carroll Enterkin, RN, Moises Blood, BS, ACSM CEP, Exercise Physiologist   Supervising physician immediately available to respond to emergencies See telemetry face sheet for immediately available ER MD   Medication changes reported     No   Fall or balance concerns reported    No   Warm-up and Cool-down Performed on first and last piece of equipment   Resistance Training Performed Yes   VAD Patient? No   Pain Assessment   Currently in Pain? No/denies   Multiple Pain Sites No         Goals Met:  Independence with exercise equipment Exercise tolerated well No report of cardiac concerns or symptoms Strength training completed today  Goals Unmet:  Not Applicable  Comments: Patient completed exercise prescription and all exercise goals during rehab session. The exercise was tolerated well and the patient is progressing in the program.     Dr. Emily Filbert is Medical Director for [place] and [facility].

## 2015-07-28 ENCOUNTER — Encounter: Payer: Medicare Other | Admitting: *Deleted

## 2015-07-28 DIAGNOSIS — I214 Non-ST elevation (NSTEMI) myocardial infarction: Secondary | ICD-10-CM | POA: Diagnosis not present

## 2015-07-28 DIAGNOSIS — Z9861 Coronary angioplasty status: Secondary | ICD-10-CM

## 2015-07-28 NOTE — Progress Notes (Signed)
Daily Session Note  Patient Details  Name: KERRINGTON GREENHALGH MRN: [id number] Date of Birth: October 16, 1931 Referring Provider:  Pixie Casino, MD  Encounter Date: 07/28/2015  Check In:     Session Check In - 07/28/15 0859    Check-In   Location [facility]-Cardiac & Pulmonary Rehab   Staff Present Heath Lark, RN, BSN, CCRP;Reshonda Koerber, RN, Michaela Corner, RRT, RCP, Respiratory Therapist   Supervising physician immediately available to respond to emergencies See telemetry face sheet for immediately available ER MD   Medication changes reported     No   Fall or balance concerns reported    No   Warm-up and Cool-down Performed on first and last piece of equipment   Resistance Training Performed Yes   VAD Patient? No   Pain Assessment   Currently in Pain? No/denies         Goals Met:  Proper associated with RPD/PD & O2 Sat Exercise tolerated well  Goals Unmet:  Not Applicable  Comments:    Dr. Emily Filbert is Medical Director for [place] and [facility].

## 2015-07-28 NOTE — Progress Notes (Signed)
Cardiac Individual Treatment Plan  Patient Details  Name: Kyle [name] MRN: [id number] Date of Birth: [birth year] Referring Provider:  Pixie Casino, MD  Initial Encounter Date:       Cardiac Rehab from 07/20/2015 in [facility] Cardiac and Pulmonary Rehab   Date  07/20/15      Visit Diagnosis: NSTEMI (non-ST elevated myocardial infarction) ([place])  S/P PTCA (percutaneous transluminal coronary angioplasty)  Patient's Home Medications on Admission:  Current outpatient prescriptions:  .  apixaban (ELIQUIS) 2.5 MG TABS tablet, Take 1 tablet (2.5 mg total) by mouth 2 (two) times daily., Disp: 60 tablet, Rfl: 5 .  atorvastatin (LIPITOR) 80 MG tablet, Take 1 tablet (80 mg total) by mouth daily at 6 PM., Disp: 30 tablet, Rfl: 11 .  clopidogrel (PLAVIX) 75 MG tablet, TAKE 1 TABLET BY MOUTH DAILY, Disp: 30 tablet, Rfl: 11 .  furosemide (LASIX) 20 MG tablet, Take 1 tablet (20 mg total) by mouth daily., Disp: 30 tablet, Rfl: 11 .  losartan (COZAAR) 50 MG tablet, Take 1 tablet (50 mg total) by mouth daily., Disp: 30 tablet, Rfl: 2 .  metoprolol tartrate (LOPRESSOR) 25 MG tablet, Take 1 tablet (25 mg total) by mouth 2 (two) times daily., Disp: 60 tablet, Rfl: 11 .  traMADol (ULTRAM) 50 MG tablet, TAKE ONE TABLET 3 TIMES DAILY AS NEEDED, Disp: 90 tablet, Rfl: 0  Past Medical History: Past Medical History  Diagnosis Date  . History of prostate cancer   . Hyperlipidemia   . Hypertension   . Arthritis   . Impaired fasting glucose   . Detached retina     right  . H/O dizziness   . Urgency of urination   . Cancer [facility])     prostate CA  . GERD (gastroesophageal reflux disease)   . Stroke [facility]) ~ 1995-02/2009 X 2    /notes 08/22/2010  . Claustrophobia     "EXTREMELY" (03/02/2015)    Tobacco Use: History  Smoking status  . Never Smoker   Smokeless tobacco  . Former Systems developer  . Types: Chew  . Quit date: 04/22/2004    Labs: Recent Review Flowsheet Data    Labs for ITP Cardiac and Pulmonary  Rehab Latest Ref Rng 10/10/2010 04/16/2013 04/20/2014 05/10/2014 03/01/2015   Cholestrol 0 - 200 mg/dL 237(H) 194 222(H) - 194   LDLCALC 0 - 99 mg/dL - 128(H) 145(H) - 128(H)   LDLDIRECT - 164.4 - - - -   HDL >40 mg/dL 52.90 44.40 49.50 - 53   Trlycerides <150 mg/dL 182.0(H) 110.0 138.0 - 63   Hemoglobin A1c <5.7 % 6.7(H) - - 6.1(H) -       Exercise Target Goals:    Exercise Program Goal: Individual exercise prescription set with THRR, safety & activity barriers. Participant demonstrates ability to understand and report RPE using BORG scale, to self-measure pulse accurately, and to acknowledge the importance of the exercise prescription.  Exercise Prescription Goal: Starting with aerobic activity 30 plus minutes a day, 3 days per week for initial exercise prescription. Provide home exercise prescription and guidelines that participant acknowledges understanding prior to discharge.  Activity Barriers & Risk Stratification:     Activity Barriers & Cardiac Risk Stratification - 07/20/15 1253    Activity Barriers & Cardiac Risk Stratification   Activity Barriers Arthritis;Joint Problems;Shortness of Breath;Assistive Device   Cardiac Risk Stratification High      6 Minute Walk:     6 Minute Walk      07/20/15 1424  6 Minute Walk   Phase Initial     Distance 860 feet     Walk Time 6 minutes     RPE 15     Symptoms Yes (comment)  Zackarie c/o being light headed dizzy at the end of his 6 min walk -his blood pressure was 120/60 and no new ectopy.     Resting HR 62 bpm     Resting BP 138/60 mmHg     Max Ex. HR 92 bpm     Max Ex. BP 120/60 mmHg        Initial Exercise Prescription:     Initial Exercise Prescription - 07/20/15 1400    Date of Initial Exercise Prescription   Date 07/20/15   Treadmill   MPH 1.2   Grade 0   Minutes 10   Recumbant Bike   Level 2   RPM 40   Watts 20   Minutes 10   NuStep   Level 2   Watts 40   Minutes 10   Arm Ergometer   Level 1    Watts 10   Minutes 10   REL-XR   Level 2   Watts 40   Minutes 10   T5 Nustep   Level 1   Watts 15   Minutes 10   Biostep-RELP   Level 2   Watts 40   Minutes 10   Prescription Details   Frequency (times per week) 3   Duration Progress to 30 minutes of continuous aerobic without signs/symptoms of physical distress   Intensity   THRR REST +  30   Ratings of Perceived Exertion 11-15   Perceived Dyspnea 0-4   Progression   Progression Continue progressive overload as per policy without signs/symptoms or physical distress.   Resistance Training   Training Prescription Yes   Weight 1   Reps 10-15      Perform Capillary Blood Glucose checks as needed.  Exercise Prescription Changes:   Exercise Comments:   Discharge Exercise Prescription (Final Exercise Prescription Changes):   Nutrition:  Target Goals: Understanding of nutrition guidelines, daily intake of sodium <1581m, cholesterol <2082m calories 30% from fat and 7% or less from saturated fats, daily to have 5 or more servings of fruits and vegetables.  Biometrics:     Pre Biometrics - 07/20/15 1426    Pre Biometrics   Height 5' 10.9" (1.801 m)   Weight 196 lb 12.8 oz (89.268 kg)   Waist Circumference 41 inches   Hip Circumference 40.5 inches   Waist to Hip Ratio 1.01 %   BMI (Calculated) 27.6       Nutrition Therapy Plan and Nutrition Goals:     Nutrition Therapy & Goals - 07/20/15 1257    Nutrition Therapy   Drug/Food Interactions Statins/Certain Fruits   Intervention Plan   Intervention Prescribe, educate and counsel regarding individualized specific dietary modifications aiming towards targeted core components such as weight, hypertension, lipid management, diabetes, heart failure and other comorbidities.   Expected Outcomes Short Term Goal: Understand basic principles of dietary content, such as calories, fat, sodium, cholesterol and nutrients.;Long Term Goal: Adherence to prescribed nutrition  plan.      Nutrition Discharge: Rate Your Plate Scores:   Nutrition Goals Re-Evaluation:   Psychosocial: Target Goals: Acknowledge presence or absence of depression, maximize coping skills, provide positive support system. Participant is able to verbalize types and ability to use techniques and skills needed for reducing stress and depression.  Initial Review & Psychosocial Screening:  Initial Psych Review & Screening - 07/20/15 [place]? Yes   Barriers   Psychosocial barriers to participate in program There are no identifiable barriers or psychosocial needs.      Quality of Life Scores:     Quality of Life - 07/20/15 1423    Quality of Life Scores   Health/Function Pre 16.4 %   Socioeconomic Pre 21 %   Psych/Spiritual Pre 18 %   Family Pre 22.8 %   GLOBAL Pre 18.55 %      PHQ-9:     Recent Review Flowsheet Data    Depression screen [facility] 2/9 07/20/2015 05/05/2015 04/20/2014 04/16/2013 04/01/2012   Decreased Interest 3 0 0 0 0   Down, Depressed, Hopeless 3 1 0 0 1   PHQ - 2 Score 6 1 0 0 1   Altered sleeping 2 - - - -   Tired, decreased energy 3 - - - -   Change in appetite 0 - - - -   Feeling bad or failure about yourself  3 - - - -   Trouble concentrating 2 - - - -   Moving slowly or fidgety/restless 3 - - - -   Suicidal thoughts 0 - - - -   PHQ-9 Score 19 - - - -   Difficult doing work/chores Somewhat difficult - - - -      Psychosocial Evaluation and Intervention:     Psychosocial Evaluation - 07/26/15 0930    Psychosocial Evaluation & Interventions   Interventions Encouraged to exercise with the program and follow exercise prescription;Relaxation education;Stress management education   Comments Counselor met with Mr. Cardiff today for initial psychosocial evaluation.  He is a retired 82 year old who had a heart attack in November of 2016.  He has a strong support system with a spouse of 41 years, a son and sister  who live close by and he is an active participant in his local church community.  Mr. Langlois has arthritis and several other health issues and reports he has not slept well for quite some time, other than the past 3 nights.  He states his appetite is good, and he denies a history of depression or anxiety.  However, Mr. Valadez participated in the Depression education done by this counselor today, and as a result admits to some current symptoms and his PHQ-9 was 19, which is moderately severe for depression.  Counselor assessed Mr. Lemmerman current mood and he stated it has improved lately with walking more and sleeping better the past 3 nights.  He admits to stress being not able to do the things he likes to do, such as working out in the yard and mowing.  Counselor encouraged Mr. Dolinger to speak with his doctor about his depressive symptoms if they do not continue to improve in the near future with exercise.  He agreed to do so.  This counselor will follow with Mr. Poss on this and make recommendations accordingly.    Continued Psychosocial Services Needed --  Mr. kinne is encouraged to speak with his Dr. about his current depressive symptoms.  If these do not improve significantly in the near future with consistent exercise, counselor will make a referral to a therapist as well.        Psychosocial Re-Evaluation:   Vocational Rehabilitation: Provide vocational rehab assistance to qualifying candidates.   Vocational Rehab Evaluation & Intervention:     Vocational Rehab -  07/20/15 1253    Initial Vocational Rehab Evaluation & Intervention   Assessment shows need for Vocational Rehabilitation No      Education: Education Goals: Education classes will be provided on a weekly basis, covering required topics. Participant will state understanding/return demonstration of topics presented.  Learning Barriers/Preferences:     Learning Barriers/Preferences - 07/20/15 1253    Learning  Barriers/Preferences   Learning Barriers Sight   Learning Preferences Individual Instruction      Education Topics: General Nutrition Guidelines/Fats and Fiber: -Group instruction provided by verbal, written material, models and posters to present the general guidelines for heart healthy nutrition. Gives an explanation and review of dietary fats and fiber.   Controlling Sodium/Reading Food Labels: -Group verbal and written material supporting the discussion of sodium use in heart healthy nutrition. Review and explanation with models, verbal and written materials for utilization of the food label.   Exercise Physiology & Risk Factors: - Group verbal and written instruction with models to review the exercise physiology of the cardiovascular system and associated critical values. Details cardiovascular disease risk factors and the goals associated with each risk factor.   Aerobic Exercise & Resistance Training: - Gives group verbal and written discussion on the health impact of inactivity. On the components of aerobic and resistive training programs and the benefits of this training and how to safely progress through these programs.   Flexibility, Balance, General Exercise Guidelines: - Provides group verbal and written instruction on the benefits of flexibility and balance training programs. Provides general exercise guidelines with specific guidelines to those with heart or lung disease. Demonstration and skill practice provided.   Stress Management: - Provides group verbal and written instruction about the health risks of elevated stress, cause of high stress, and healthy ways to reduce stress.   Depression: - Provides group verbal and written instruction on the correlation between heart/lung disease and depressed mood, treatment options, and the stigmas associated with seeking treatment.          Cardiac Rehab from 07/26/2015 in [facility] Cardiac and Pulmonary Rehab   Date  07/26/15    Educator  [facility]   Instruction Review Code  2- meets goals/outcomes      Anatomy & Physiology of the Heart: - Group verbal and written instruction and models provide basic cardiac anatomy and physiology, with the coronary electrical and arterial systems. Review of: AMI, Angina, Valve disease, Heart Failure, Cardiac Arrhythmia, Pacemakers, and the ICD.   Cardiac Procedures: - Group verbal and written instruction and models to describe the testing methods done to diagnose heart disease. Reviews the outcomes of the test results. Describes the treatment choices: Medical Management, Angioplasty, or Coronary Bypass Surgery.   Cardiac Medications: - Group verbal and written instruction to review commonly prescribed medications for heart disease. Reviews the medication, class of the drug, and side effects. Includes the steps to properly store meds and maintain the prescription regimen.   Go Sex-Intimacy & Heart Disease, Get SMART - Goal Setting: - Group verbal and written instruction through game format to discuss heart disease and the return to sexual intimacy. Provides group verbal and written material to discuss and apply goal setting through the application of the S.M.A.R.T. Method.   Other Matters of the Heart: - Provides group verbal, written materials and models to describe Heart Failure, Angina, Valve Disease, and Diabetes in the realm of heart disease. Includes description of the disease process and treatment options available to the cardiac patient.   Exercise & Equipment  Safety: - Individual verbal instruction and demonstration of equipment use and safety with use of the equipment.      Cardiac Rehab from 07/28/2015 in [facility] Cardiac and Pulmonary Rehab   Date  07/28/15   Educator  C. EnterkinRN   Instruction Review Code  1- partially meets, needs review/practice Franchi needs extra help with the Treadmill,He inc speed too m]      Infection Prevention: - Provides verbal and written  material to individual with discussion of infection control including proper hand washing and proper equipment cleaning during exercise session.      Cardiac Rehab from 07/26/2015 in [facility] Cardiac and Pulmonary Rehab   Date  07/20/15   Educator  C. EnterkinRN   Instruction Review Code  2- meets goals/outcomes      Falls Prevention: - Provides verbal and written material to individual with discussion of falls prevention and safety.      Cardiac Rehab from 07/26/2015 in [facility] Cardiac and Pulmonary Rehab   Date  07/20/15   Educator  C. [place]   Instruction Review Code  1- partially meets, needs review/practice      Diabetes: - Individual verbal and written instruction to review signs/symptoms of diabetes, desired ranges of glucose level fasting, after meals and with exercise. Advice that pre and post exercise glucose checks will be done for 3 sessions at entry of program.    Knowledge Questionnaire Score:     Knowledge Questionnaire Score - 07/20/15 1427    Knowledge Questionnaire Score   Pre Score 1  Okie said he did not know many answers and he only answered one correctly.       Core Components/Risk Factors/Patient Goals at Admission:     Personal Goals and Risk Factors at Admission - 07/20/15 1428    Core Components/Risk Factors/Patient Goals on Admission   Sedentary Yes   Intervention Provide advice, education, support and counseling about physical activity/exercise needs.;Develop an individualized exercise prescription for aerobic and resistive training based on initial evaluation findings, risk stratification, comorbidities and participant's personal goals.   Expected Outcomes Achievement of increased cardiorespiratory fitness and enhanced flexibility, muscular endurance and strength shown through measurements of functional capacity and personal statement of participant.      Core Components/Risk Factors/Patient Goals Review:    Core Components/Risk Factors/Patient  Goals at Discharge (Final Review):    ITP Comments:     ITP Comments      07/20/15 1430           ITP Comments  Charly uses his walker at times so I encouraged him and got him to use his walker with his 6 minute walk in the hall.           Comments:

## 2015-07-31 ENCOUNTER — Encounter: Payer: Medicare Other | Admitting: *Deleted

## 2015-07-31 ENCOUNTER — Other Ambulatory Visit: Payer: Self-pay | Admitting: *Deleted

## 2015-07-31 DIAGNOSIS — Z9861 Coronary angioplasty status: Secondary | ICD-10-CM

## 2015-07-31 DIAGNOSIS — I5021 Acute systolic (congestive) heart failure: Secondary | ICD-10-CM

## 2015-07-31 DIAGNOSIS — I214 Non-ST elevation (NSTEMI) myocardial infarction: Secondary | ICD-10-CM

## 2015-07-31 MED ORDER — LOSARTAN POTASSIUM 50 MG PO TABS
50.0000 mg | ORAL_TABLET | Freq: Every day | ORAL | Status: DC
Start: 2015-07-31 — End: 2016-04-18

## 2015-07-31 NOTE — Telephone Encounter (Signed)
Pharmacy rep. Is calling to make sure patient is to still be on Losartan 50mg  daily . They asked if we can refill his rx for 90 days to better help the patient remember his rx.  Losartan 50mg , once daily by mouth #90, 3RF has been sent to [facility].

## 2015-07-31 NOTE — Progress Notes (Signed)
Daily Session Note  Patient Details  Name: Kyle [name] MRN: [id number] Date of Birth: 01-30-1932 Referring Provider:  Pixie Casino, MD  Encounter Date: 07/31/2015  Check In:     Session Check In - 07/31/15 8984    Check-In   Location [facility]-Cardiac & Pulmonary Rehab   Staff Present Nyoka Cowden, Mauricia Area, BS, ACSM CEP, Exercise Physiologist;Carroll Guy Begin, RN, BSN   Supervising physician immediately available to respond to emergencies See telemetry face sheet for immediately available ER MD   Medication changes reported     No   Fall or balance concerns reported    No   Warm-up and Cool-down Performed on first and last piece of equipment   Resistance Training Performed Yes   VAD Patient? No   Pain Assessment   Currently in Pain? No/denies   Multiple Pain Sites No           Exercise Prescription Changes - 07/31/15 0800    Resistance Training   Training Prescription Yes   Weight 3   Reps 10-15   Treadmill   MPH 1.4   Grade 0   Minutes 20   Recumbant Bike   Level --   RPM --   Watts --   Minutes --   NuStep   Level 2   Watts 50   Minutes 15   Arm Ergometer   Level --   Watts --   Minutes --   REL-XR   Level --   Watts --   Minutes --   T5 Nustep   Level --   Watts --   Minutes --   Biostep-RELP   Level --   Watts --   Minutes --      Goals Met:  Independence with exercise equipment Exercise tolerated well Personal goals reviewed No report of cardiac concerns or symptoms Strength training completed today  Goals Unmet:  Not Applicable  Comments: Reviewed individualized exercise prescription and made increases per departmental policy. Exercise increases were discussed with the patient and they were able to perform the new work loads without issue (no signs or symptoms).     Dr. Emily Filbert is Medical Director for [place] and [facility].

## 2015-08-02 ENCOUNTER — Encounter: Payer: Medicare Other | Admitting: *Deleted

## 2015-08-02 DIAGNOSIS — Z9861 Coronary angioplasty status: Secondary | ICD-10-CM

## 2015-08-02 DIAGNOSIS — I214 Non-ST elevation (NSTEMI) myocardial infarction: Secondary | ICD-10-CM

## 2015-08-02 NOTE — Progress Notes (Signed)
Daily Session Note  Patient Details  Name: Kyle [name] MRN: [id number] Date of Birth: 1931/07/04 Referring Provider:  Pixie Casino, MD  Encounter Date: 08/02/2015  Check In:     Session Check In - 08/02/15 0826    Check-In   Location [facility]-Cardiac & Pulmonary Rehab   Staff Present Nyoka Cowden, RN;Deontez Klinke, RN, Alex Gardener, DPT, CEEA   Supervising physician immediately available to respond to emergencies See telemetry face sheet for immediately available ER MD   Medication changes reported     No   Fall or balance concerns reported    No   Warm-up and Cool-down Performed on first and last piece of equipment   Resistance Training Performed Yes   VAD Patient? No   Pain Assessment   Currently in Pain? No/denies         Goals Met:  Proper associated with RPD/PD & O2 Sat Exercise tolerated well No report of cardiac concerns or symptoms  Goals Unmet:  Not Applicable  Comments:    Dr. Emily Filbert is Medical Director for [place] and [facility].

## 2015-08-04 DIAGNOSIS — Z9861 Coronary angioplasty status: Secondary | ICD-10-CM

## 2015-08-04 DIAGNOSIS — I214 Non-ST elevation (NSTEMI) myocardial infarction: Secondary | ICD-10-CM | POA: Diagnosis not present

## 2015-08-04 NOTE — Progress Notes (Signed)
Daily Session Note  Patient Details  Name: Kyle [name] MRN: [id number] Date of Birth: 09/28/1931 Referring Provider:    Encounter Date: 08/04/2015  Check In:     Session Check In - 08/04/15 0840    Check-In   Location [facility]-Cardiac & Pulmonary Rehab   Staff Present  , RN, BSN, CCRP;Carroll Enterkin, RN, BSN;Rebecca Sickles, DPT, CEEA   Supervising physician immediately available to respond to emergencies See telemetry face sheet for immediately available ER MD   Medication changes reported     No   Fall or balance concerns reported    No   Warm-up and Cool-down Performed on first and last piece of equipment   Resistance Training Performed Yes   VAD Patient? No         Goals Met:  Exercise tolerated well No report of cardiac concerns or symptoms Strength training completed today  Goals Unmet:  Not Applicable  Comments: Doing well with exercise prescription progression.    Dr. Mark Miller is Medical Director for [facility] and [facility]. 

## 2015-08-07 ENCOUNTER — Encounter: Payer: Medicare Other | Admitting: *Deleted

## 2015-08-07 DIAGNOSIS — I214 Non-ST elevation (NSTEMI) myocardial infarction: Secondary | ICD-10-CM | POA: Diagnosis not present

## 2015-08-07 DIAGNOSIS — Z9861 Coronary angioplasty status: Secondary | ICD-10-CM

## 2015-08-07 NOTE — Progress Notes (Signed)
Daily Session Note  Patient Details  Name: Kyle [name] MRN: [id number] Date of Birth: 09/15/1931 Referring Provider:    Encounter Date: 08/07/2015  Check In:     Session Check In - 08/07/15 0826    Check-In   Location [facility]-Cardiac & Pulmonary Rehab   Staff Present Carroll Enterkin, RN, BSN;Susanne Bice, RN, BSN, CCRP; , BS, ACSM CEP, Exercise Physiologist   Supervising physician immediately available to respond to emergencies See telemetry face sheet for immediately available ER MD   Medication changes reported     No   Fall or balance concerns reported    No   Warm-up and Cool-down Performed on first and last piece of equipment   Resistance Training Performed Yes   VAD Patient? No   Pain Assessment   Currently in Pain? No/denies   Multiple Pain Sites No         Goals Met:  Independence with exercise equipment Exercise tolerated well No report of cardiac concerns or symptoms Strength training completed today  Goals Unmet:  Not Applicable  Comments: Patient completed exercise prescription and all exercise goals during rehab session. The exercise was tolerated well and the patient is progressing in the program.     Dr. Mark Miller is Medical Director for [facility] and [facility]. 

## 2015-08-09 ENCOUNTER — Encounter: Payer: Medicare Other | Admitting: *Deleted

## 2015-08-09 DIAGNOSIS — Z9861 Coronary angioplasty status: Secondary | ICD-10-CM

## 2015-08-09 DIAGNOSIS — I214 Non-ST elevation (NSTEMI) myocardial infarction: Secondary | ICD-10-CM

## 2015-08-09 NOTE — Progress Notes (Signed)
Daily Session Note  Patient Details  Name: Kyle [name] MRN: [id number] Date of Birth: 04/05/1932 Referring Provider:    Encounter Date: 08/09/2015  Check In:     Session Check In - 08/09/15 0835    Check-In   Staff Present Gerlene Burdock, RN, BSN;Rebecca Sickles, DPT, CEEA;Heath Lark, RN, BSN, CCRP   Supervising physician immediately available to respond to emergencies See telemetry face sheet for immediately available ER MD   Medication changes reported     No   Fall or balance concerns reported    No   Warm-up and Cool-down Performed on first and last piece of equipment   VAD Patient? No   Pain Assessment   Currently in Pain? No/denies         Goals Met:  Exercise tolerated well  Goals Unmet:  Not Applicable  Comments: Doing well with exercise prescription progression.    Dr. Emily Filbert is Medical Director for [place] and [facility].

## 2015-08-11 ENCOUNTER — Encounter: Payer: Medicare Other | Admitting: *Deleted

## 2015-08-11 DIAGNOSIS — Z9861 Coronary angioplasty status: Secondary | ICD-10-CM

## 2015-08-11 DIAGNOSIS — I214 Non-ST elevation (NSTEMI) myocardial infarction: Secondary | ICD-10-CM | POA: Diagnosis not present

## 2015-08-11 NOTE — Progress Notes (Signed)
Daily Session Note  Patient Details  Name: Kyle [name] MRN: [id number] Date of Birth: 1932-03-25 Referring Provider:    Encounter Date: 08/11/2015  Check In:     Session Check In - 08/11/15 0906    Check-In   Staff Present Heath Lark, RN, BSN, CCRP;Carroll Enterkin, RN, Alex Gardener, DPT, CEEA   Supervising physician immediately available to respond to emergencies See telemetry face sheet for immediately available ER MD   Medication changes reported     No   Fall or balance concerns reported    No   Warm-up and Cool-down Performed on first and last piece of equipment   VAD Patient? No   Pain Assessment   Currently in Pain? Yes         Goals Met:  Exercise tolerated well No report of cardiac concerns or symptoms Strength training completed today  Goals Unmet:  Not Applicable  Comments: Doing well with exercise prescription progression.    Dr. Emily Filbert is Medical Director for [place] and [facility].

## 2015-08-13 ENCOUNTER — Encounter: Payer: Self-pay | Admitting: *Deleted

## 2015-08-13 DIAGNOSIS — Z9861 Coronary angioplasty status: Secondary | ICD-10-CM

## 2015-08-13 DIAGNOSIS — I214 Non-ST elevation (NSTEMI) myocardial infarction: Secondary | ICD-10-CM

## 2015-08-13 NOTE — Progress Notes (Signed)
Cardiac Individual Treatment Plan  Patient Details  Name: Kyle [name] MRN: [id number] Date of Birth: [birth year] Referring Provider:    Initial Encounter Date:       Cardiac Rehab from 07/20/2015 in [facility] Cardiac and Pulmonary Rehab   Date  07/20/15      Visit Diagnosis: S/P PTCA (percutaneous transluminal coronary angioplasty)  NSTEMI (non-ST elevated myocardial infarction) ([place])  Patient's Home Medications on Admission:  Current outpatient prescriptions:  .  apixaban (ELIQUIS) 2.5 MG TABS tablet, Take 1 tablet (2.5 mg total) by mouth 2 (two) times daily., Disp: 60 tablet, Rfl: 5 .  atorvastatin (LIPITOR) 80 MG tablet, Take 1 tablet (80 mg total) by mouth daily at 6 PM., Disp: 30 tablet, Rfl: 11 .  clopidogrel (PLAVIX) 75 MG tablet, TAKE 1 TABLET BY MOUTH DAILY, Disp: 30 tablet, Rfl: 11 .  furosemide (LASIX) 20 MG tablet, Take 1 tablet (20 mg total) by mouth daily., Disp: 30 tablet, Rfl: 11 .  losartan (COZAAR) 50 MG tablet, Take 1 tablet (50 mg total) by mouth daily., Disp: 90 tablet, Rfl: 3 .  metoprolol tartrate (LOPRESSOR) 25 MG tablet, Take 1 tablet (25 mg total) by mouth 2 (two) times daily., Disp: 60 tablet, Rfl: 11 .  traMADol (ULTRAM) 50 MG tablet, TAKE ONE TABLET 3 TIMES DAILY AS NEEDED, Disp: 90 tablet, Rfl: 0  Past Medical History: Past Medical History  Diagnosis Date  . History of prostate cancer   . Hyperlipidemia   . Hypertension   . Arthritis   . Impaired fasting glucose   . Detached retina     right  . H/O dizziness   . Urgency of urination   . Cancer [facility])     prostate CA  . GERD (gastroesophageal reflux disease)   . Stroke [facility]) ~ 1995-02/2009 X 2    /notes 08/22/2010  . Claustrophobia     "EXTREMELY" (03/02/2015)    Tobacco Use: History  Smoking status  . Never Smoker   Smokeless tobacco  . Former Systems developer  . Types: Chew  . Quit date: 04/22/2004    Labs: Recent Review Flowsheet Data    Labs for ITP Cardiac and Pulmonary Rehab Latest Ref Rng  10/10/2010 04/16/2013 04/20/2014 05/10/2014 03/01/2015   Cholestrol 0 - 200 mg/dL 237(H) 194 222(H) - 194   LDLCALC 0 - 99 mg/dL - 128(H) 145(H) - 128(H)   LDLDIRECT - 164.4 - - - -   HDL >40 mg/dL 52.90 44.40 49.50 - 53   Trlycerides <150 mg/dL 182.0(H) 110.0 138.0 - 63   Hemoglobin A1c <5.7 % 6.7(H) - - 6.1(H) -       Exercise Target Goals:    Exercise Program Goal: Individual exercise prescription set with THRR, safety & activity barriers. Participant demonstrates ability to understand and report RPE using BORG scale, to self-measure pulse accurately, and to acknowledge the importance of the exercise prescription.  Exercise Prescription Goal: Starting with aerobic activity 30 plus minutes a day, 3 days per week for initial exercise prescription. Provide home exercise prescription and guidelines that participant acknowledges understanding prior to discharge.  Activity Barriers & Risk Stratification:     Activity Barriers & Cardiac Risk Stratification - 07/20/15 1253    Activity Barriers & Cardiac Risk Stratification   Activity Barriers Arthritis;Joint Problems;Shortness of Breath;Assistive Device   Cardiac Risk Stratification High      6 Minute Walk:     6 Minute Walk      07/20/15 1424  6 Minute Walk   Phase Initial     Distance 860 feet     Walk Time 6 minutes     RPE 15     Symptoms Yes (comment)  Kyle [name] c/o being light headed dizzy at the end of his 6 min walk -his blood pressure was 120/60 and no new ectopy.     Resting HR 62 bpm     Resting BP 138/60 mmHg     Max Ex. HR 92 bpm     Max Ex. BP 120/60 mmHg        Initial Exercise Prescription:     Initial Exercise Prescription - 07/20/15 1400    Date of Initial Exercise RX and Referring Provider   Date 07/20/15   Treadmill   MPH 1.2   Grade 0   Minutes 10   Recumbant Bike   Level 2   RPM 40   Watts 20   Minutes 10   NuStep   Level 2   Watts 40   Minutes 10   Arm Ergometer   Level 1   Watts  10   Minutes 10   REL-XR   Level 2   Watts 40   Minutes 10   T5 Nustep   Level 1   Watts 15   Minutes 10   Biostep-RELP   Level 2   Watts 40   Minutes 10   Prescription Details   Frequency (times per week) 3   Duration Progress to 30 minutes of continuous aerobic without signs/symptoms of physical distress   Intensity   THRR REST +  30   Ratings of Perceived Exertion 11-15   Perceived Dyspnea 0-4   Progression   Progression Continue progressive overload as per policy without signs/symptoms or physical distress.   Resistance Training   Training Prescription Yes   Weight 1   Reps 10-15      Perform Capillary Blood Glucose checks as needed.  Exercise Prescription Changes:     Exercise Prescription Changes      07/31/15 0800           Exercise Review   Progression Yes       Response to Exercise   Blood Pressure (Admit) 128/72 mmHg       Blood Pressure (Exercise) 138/72 mmHg       Blood Pressure (Exit) 130/70 mmHg       Heart Rate (Admit) 59 bpm       Heart Rate (Exercise) 77 bpm       Heart Rate (Exit) 61 bpm       Rating of Perceived Exertion (Exercise) 11       Symptoms none       Duration Progress to 30 minutes of continuous aerobic without signs/symptoms of physical distress       Intensity Rest + 30       Progression   Progression Continue progressive overload as per policy without signs/symptoms or physical distress.       Resistance Training   Training Prescription Yes       Weight 3       Reps 10-15       Treadmill   MPH 1.6       Grade 0       Minutes 15       Recumbant Bike   Level --       RPM --       Watts --  Minutes --       NuStep   Level 2       Watts 50       Minutes 15       Arm Ergometer   Level --       Watts --       Minutes --       REL-XR   Level --       Watts --       Minutes --       T5 Nustep   Level --       Watts --       Minutes --       Biostep-RELP   Level --       Watts --       Minutes --           Exercise Comments:     Exercise Comments      07/31/15 0823 07/31/15 0828 07/31/15 1401 08/09/15 1308     Exercise Comments Kyle [name]'s weight has been increased to 3 lbs for the strength training portion of the exercise class. This increase was discussed with the patient and and he tolerated it with no symptoms.  From his initial exercise prescription Yedidya has been consistantly using the TM and NS machines, and has increased his total exercise time to up to 35 min in duration. He is able to tolerate this duration without rest breaks and has been asymptomatic.  Shivaan stated that he was been a little sore since starting his exercise program, but not to the point of inhibiting his daily activities.  His energy level is about the same but is hoping to gain energy from participating in a structured exercise program.  I faxed all Cardiac Rehab rhythm strips to Dr. Levon Hedger offiice today. No c/o. No s/s but junctional rhythm and muliptiple PVC's couplets. No s/s.        Discharge Exercise Prescription (Final Exercise Prescription Changes):     Exercise Prescription Changes - 07/31/15 0800    Exercise Review   Progression Yes   Response to Exercise   Blood Pressure (Admit) 128/72 mmHg   Blood Pressure (Exercise) 138/72 mmHg   Blood Pressure (Exit) 130/70 mmHg   Heart Rate (Admit) 59 bpm   Heart Rate (Exercise) 77 bpm   Heart Rate (Exit) 61 bpm   Rating of Perceived Exertion (Exercise) 11   Symptoms none   Duration Progress to 30 minutes of continuous aerobic without signs/symptoms of physical distress   Intensity Rest + 30   Progression   Progression Continue progressive overload as per policy without signs/symptoms or physical distress.   Resistance Training   Training Prescription Yes   Weight 3   Reps 10-15   Treadmill   MPH 1.6   Grade 0   Minutes 15   Recumbant Bike   Level --   RPM --   Watts --   Minutes --   NuStep   Level 2   Watts 50   Minutes 15    Arm Ergometer   Level --   Watts --   Minutes --   REL-XR   Level --   Watts --   Minutes --   T5 Nustep   Level --   Watts --   Minutes --   Biostep-RELP   Level --   Watts --   Minutes --      Nutrition:  Target Goals: Understanding of nutrition guidelines, daily intake  of sodium <1567m, cholesterol <2058m calories 30% from fat and 7% or less from saturated fats, daily to have 5 or more servings of fruits and vegetables.  Biometrics:     Pre Biometrics - 07/20/15 1426    Pre Biometrics   Height 5' 10.9" (1.801 m)   Weight 196 lb 12.8 oz (89.268 kg)   Waist Circumference 41 inches   Hip Circumference 40.5 inches   Waist to Hip Ratio 1.01 %   BMI (Calculated) 27.6       Nutrition Therapy Plan and Nutrition Goals:     Nutrition Therapy & Goals - 07/20/15 1257    Nutrition Therapy   Drug/Food Interactions Statins/Certain Fruits   Intervention Plan   Intervention Prescribe, educate and counsel regarding individualized specific dietary modifications aiming towards targeted core components such as weight, hypertension, lipid management, diabetes, heart failure and other comorbidities.   Expected Outcomes Short Term Goal: Understand basic principles of dietary content, such as calories, fat, sodium, cholesterol and nutrients.;Long Term Goal: Adherence to prescribed nutrition plan.      Nutrition Discharge: Rate Your Plate Scores:   Nutrition Goals Re-Evaluation:   Psychosocial: Target Goals: Acknowledge presence or absence of depression, maximize coping skills, provide positive support system. Participant is able to verbalize types and ability to use techniques and skills needed for reducing stress and depression.  Initial Review & Psychosocial Screening:     Initial Psych Review & Screening - 07/20/15 14BastropYes   Barriers   Psychosocial barriers to participate in program There are no identifiable barriers or  psychosocial needs.      Quality of Life Scores:     Quality of Life - 07/20/15 1423    Quality of Life Scores   Health/Function Pre 16.4 %   Socioeconomic Pre 21 %   Psych/Spiritual Pre 18 %   Family Pre 22.8 %   GLOBAL Pre 18.55 %      PHQ-9:     Recent Review Flowsheet Data    [facility] 07/20/2015 05/05/2015 04/20/2014 04/16/2013 04/01/2012   Decreased Interest 3 0 0 0 0   Down, Depressed, Hopeless 3 1 0 0 1   PHQ - 2 Score 6 1 0 0 1   Altered sleeping 2 - - - -   Tired, decreased energy 3 - - - -   Change in appetite 0 - - - -   Feeling bad or failure about yourself  3 - - - -   Trouble concentrating 2 - - - -   Moving slowly or fidgety/restless 3 - - - -   Suicidal thoughts 0 - - - -   PHQ-9 Score 19 - - - -   Difficult doing work/chores Somewhat difficult - - - -      Psychosocial Evaluation and Intervention:     Psychosocial Evaluation - 07/26/15 0930    Psychosocial Evaluation & Interventions   Interventions Encouraged to exercise with the program and follow exercise prescription;Relaxation education;Stress management education   Comments Counselor met with Mr. ClMalteseoday for initial psychosocial evaluation.  He is a retired [id number] old who had a heart attack in November of 2016.  He has a strong support system with a spouse of [id number], a son and sister who live close by and he is an active participant in his local church community.  Mr. ClPerrottas arthritis and several other health issues and reports  he has not slept well for quite some time, other than the past 3 nights.  He states his appetite is good, and he denies a history of depression or anxiety.  However, Mr. Burley participated in the Depression education done by this counselor today, and as a result admits to some current symptoms and his PHQ-9 was 19, which is moderately severe for depression.  Counselor assessed Mr. Lipsitz current mood and he stated it has improved lately with walking  more and sleeping better the past 3 nights.  He admits to stress being not able to do the things he likes to do, such as working out in the yard and mowing.  Counselor encouraged Mr. Gunning to speak with his doctor about his depressive symptoms if they do not continue to improve in the near future with exercise.  He agreed to do so.  This counselor will follow with Mr. Bender on this and make recommendations accordingly.    Continued Psychosocial Services Needed --  Mr. vansickle is encouraged to speak with his Dr. about his current depressive symptoms.  If these do not improve significantly in the near future with consistent exercise, counselor will make a referral to a therapist as well.        Psychosocial Re-Evaluation:   Vocational Rehabilitation: Provide vocational rehab assistance to qualifying candidates.   Vocational Rehab Evaluation & Intervention:     Vocational Rehab - 07/20/15 1253    Initial Vocational Rehab Evaluation & Intervention   Assessment shows need for Vocational Rehabilitation No      Education: Education Goals: Education classes will be provided on a weekly basis, covering required topics. Participant will state understanding/return demonstration of topics presented.  Learning Barriers/Preferences:     Learning Barriers/Preferences - 07/20/15 1253    Learning Barriers/Preferences   Learning Barriers Sight   Learning Preferences Individual Instruction      Education Topics: General Nutrition Guidelines/Fats and Fiber: -Group instruction provided by verbal, written material, models and posters to present the general guidelines for heart healthy nutrition. Gives an explanation and review of dietary fats and fiber.   Controlling Sodium/Reading Food Labels: -Group verbal and written material supporting the discussion of sodium use in heart healthy nutrition. Review and explanation with models, verbal and written materials for utilization of the food label.           Cardiac Rehab from 08/09/2015 in [facility] Cardiac and Pulmonary Rehab   Date  07/31/15   Educator  CR   Instruction Review Code  2- meets goals/outcomes      Exercise Physiology & Risk Factors: - Group verbal and written instruction with models to review the exercise physiology of the cardiovascular system and associated critical values. Details cardiovascular disease risk factors and the goals associated with each risk factor.      Cardiac Rehab from 08/09/2015 in [facility] Cardiac and Pulmonary Rehab   Date  08/09/15   Educator  BS   Instruction Review Code  2- meets goals/outcomes      Aerobic Exercise & Resistance Training: - Gives group verbal and written discussion on the health impact of inactivity. On the components of aerobic and resistive training programs and the benefits of this training and how to safely progress through these programs.   Flexibility, Balance, General Exercise Guidelines: - Provides group verbal and written instruction on the benefits of flexibility and balance training programs. Provides general exercise guidelines with specific guidelines to those with heart or lung disease. Demonstration and skill practice  provided.   Stress Management: - Provides group verbal and written instruction about the health risks of elevated stress, cause of high stress, and healthy ways to reduce stress.   Depression: - Provides group verbal and written instruction on the correlation between heart/lung disease and depressed mood, treatment options, and the stigmas associated with seeking treatment.      Cardiac Rehab from 07/26/2015 in [facility] Cardiac and Pulmonary Rehab   Date  07/26/15   Educator  [facility]   Instruction Review Code  2- meets goals/outcomes      Anatomy & Physiology of the Heart: - Group verbal and written instruction and models provide basic cardiac anatomy and physiology, with the coronary electrical and arterial systems. Review of: AMI, Angina, Valve disease,  Heart Failure, Cardiac Arrhythmia, Pacemakers, and the ICD.   Cardiac Procedures: - Group verbal and written instruction and models to describe the testing methods done to diagnose heart disease. Reviews the outcomes of the test results. Describes the treatment choices: Medical Management, Angioplasty, or Coronary Bypass Surgery.   Cardiac Medications: - Group verbal and written instruction to review commonly prescribed medications for heart disease. Reviews the medication, class of the drug, and side effects. Includes the steps to properly store meds and maintain the prescription regimen.   Go Sex-Intimacy & Heart Disease, Get SMART - Goal Setting: - Group verbal and written instruction through game format to discuss heart disease and the return to sexual intimacy. Provides group verbal and written material to discuss and apply goal setting through the application of the S.M.A.R.T. Method.   Other Matters of the Heart: - Provides group verbal, written materials and models to describe Heart Failure, Angina, Valve Disease, and Diabetes in the realm of heart disease. Includes description of the disease process and treatment options available to the cardiac patient.      Cardiac Rehab from 08/09/2015 in [facility] Cardiac and Pulmonary Rehab   Date  08/07/15   Educator  SB   Instruction Review Code  2- meets goals/outcomes      Exercise & Equipment Safety: - Individual verbal instruction and demonstration of equipment use and safety with use of the equipment.      Cardiac Rehab from 08/09/2015 in [facility] Cardiac and Pulmonary Rehab   Date  07/28/15   Educator  C. EnterkinRN   Instruction Review Code  1- partially meets, needs review/practice Geiman needs extra help with the Treadmill,[employer] inc speed too m]      Infection Prevention: - Provides verbal and written material to individual with discussion of infection control including proper hand washing and proper equipment cleaning during exercise  session.      Cardiac Rehab from 07/26/2015 in [facility] Cardiac and Pulmonary Rehab   Date  07/20/15   Educator  C. EnterkinRN   Instruction Review Code  2- meets goals/outcomes      Falls Prevention: - Provides verbal and written material to individual with discussion of falls prevention and safety.      Cardiac Rehab from 07/26/2015 in [facility] Cardiac and Pulmonary Rehab   Date  07/20/15   Educator  C. [place]   Instruction Review Code  1- partially meets, needs review/practice      Diabetes: - Individual verbal and written instruction to review signs/symptoms of diabetes, desired ranges of glucose level fasting, after meals and with exercise. Advice that pre and post exercise glucose checks will be done for 3 sessions at entry of program.    Knowledge Questionnaire Score:  Knowledge Questionnaire Score - 07/20/15 1427    Knowledge Questionnaire Score   Pre Score 1  Hassan said he did not know many answers and he only answered one correctly.       Core Components/Risk Factors/Patient Goals at Admission:     Personal Goals and Risk Factors at Admission - 07/20/15 1428    Core Components/Risk Factors/Patient Goals on Admission   Sedentary Yes   Intervention Provide advice, education, support and counseling about physical activity/exercise needs.;Develop an individualized exercise prescription for aerobic and resistive training based on initial evaluation findings, risk stratification, comorbidities and participant's personal goals.   Expected Outcomes Achievement of increased cardiorespiratory fitness and enhanced flexibility, muscular endurance and strength shown through measurements of functional capacity and personal statement of participant.      Core Components/Risk Factors/Patient Goals Review:      Goals and Risk Factor Review      08/02/15 0935 08/02/15 0940 08/04/15 0847       Core Components/Risk Factors/Patient Goals Review   Personal Goals Review Weight  Management/Obesity;Hypertension  Lipids     Review Jahmir states he is trying to watch his weight, and is taking blood pressure medication as prescribed.  He has a blood pressure monitor at home and states he knows how to use it.  He states he has taken the group nutrition education class and does not wish to meet with the dietitian individually at this time.  [place] is compliant with his medications and will continue to follow the nutrition guidelines to help with cholesterol control     Expected Outcomes Maleko will maintain weight and vital signs within acceptable parameters.  Individual session with dietitian will be made available if Margarita chooses to pursue this option. Aashrith will maintain weight and vital signs within acceptable parameters.  Individual session with dietitian will be made available if Rudy chooses to pursue this option. maintanence of lifestyle changes for cholesterol control        Core Components/Risk Factors/Patient Goals at Discharge (Final Review):      Goals and Risk Factor Review - 08/04/15 0847    Core Components/Risk Factors/Patient Goals Review   Personal Goals Review Lipids   Review Alexsandro is compliant with his medications and will continue to follow the nutrition guidelines to help with cholesterol control   Expected Outcomes maintanence of lifestyle changes for cholesterol control      ITP Comments:     ITP Comments      07/20/15 1430 08/02/15 0939 08/02/15 0940 08/09/15 1306 08/13/15 0940   ITP Comments  Deidre Ala uses his walker at times so I encouraged him and got him to use his walker with his 6 minute walk in the hall.  Iren states he is trying to watch his weight, and is taking blood pressure medication as prescribed.  He has a blood pressure monitor at home and states he knows how to use it.  He states he has taken the group nutrition education class and does not wish to meet with the dietitian individually at this time Antwuan will maintain weight and vital  signs within acceptable parameters.  Individual session with dietitian will be made available if Jesson chooses to pursue this option. I faxed all Cardiac Rehab rhythm strips to Dr. Levon Hedger offiice today. No c/o. No s/s but junctional rhythm and muliptiple PVC's couplets. No s/s.  30 day review.  Continue with ITP  New to program      Comments:

## 2015-08-14 ENCOUNTER — Encounter: Payer: Medicare Other | Admitting: *Deleted

## 2015-08-14 DIAGNOSIS — I214 Non-ST elevation (NSTEMI) myocardial infarction: Secondary | ICD-10-CM | POA: Diagnosis not present

## 2015-08-14 DIAGNOSIS — Z9861 Coronary angioplasty status: Secondary | ICD-10-CM

## 2015-08-14 NOTE — Progress Notes (Signed)
Daily Session Note  Patient Details  Name: Kyle [name] MRN: [id number] Date of Birth: 07/17/1931 Referring Provider:    Encounter Date: 08/14/2015  Check In:     Session Check In - 08/14/15 0841    Check-In   Location [facility]-Cardiac & Pulmonary Rehab   Staff Present Susanne Bice, RN, BSN, CCRP;Rebecca Sickles, DPT, CEEA; , BS, ACSM CEP, Exercise Physiologist   Supervising physician immediately available to respond to emergencies See telemetry face sheet for immediately available ER MD   Medication changes reported     No   Fall or balance concerns reported    No   Warm-up and Cool-down Performed on first and last piece of equipment   Resistance Training Performed Yes   VAD Patient? No   Pain Assessment   Currently in Pain? No/denies   Multiple Pain Sites No         Goals Met:  Independence with exercise equipment Exercise tolerated well No report of cardiac concerns or symptoms Strength training completed today  Goals Unmet:  Not Applicable  Comments: Patient completed exercise prescription and all exercise goals during rehab session. The exercise was tolerated well and the patient is progressing in the program.     Dr. Mark Miller is Medical Director for [facility] and [facility]. 

## 2015-08-15 ENCOUNTER — Other Ambulatory Visit: Payer: Self-pay | Admitting: Internal Medicine

## 2015-08-15 NOTE — Telephone Encounter (Signed)
Verbal refill given to [place] at pharmacy

## 2015-08-15 NOTE — Telephone Encounter (Signed)
Approved: #90 x 0 

## 2015-08-15 NOTE — Telephone Encounter (Signed)
Last filled 06-20-15 #90 Last OV 05-05-15 Next OV 05-09-16

## 2015-08-16 ENCOUNTER — Encounter: Payer: Medicare Other | Admitting: *Deleted

## 2015-08-16 DIAGNOSIS — Z9861 Coronary angioplasty status: Secondary | ICD-10-CM

## 2015-08-16 DIAGNOSIS — I214 Non-ST elevation (NSTEMI) myocardial infarction: Secondary | ICD-10-CM

## 2015-08-16 NOTE — Progress Notes (Signed)
Daily Session Note  Patient Details  Name: DEVARIOUS PAVEK MRN: [id number] Date of Birth: 1931/05/17 Referring Provider:    Encounter Date: 08/16/2015  Check In:     Session Check In - 08/16/15 0833    Check-In   Staff Present Nyoka Cowden, RN;Susanne Bice, RN, BSN, CCRP;Rebecca Sickles, DPT, CEEA   Supervising physician immediately available to respond to emergencies See telemetry face sheet for immediately available ER MD   Medication changes reported     No   Fall or balance concerns reported    No   Warm-up and Cool-down Performed on first and last piece of equipment   VAD Patient? No   Pain Assessment   Currently in Pain? No/denies         Goals Met:  Exercise tolerated well No report of cardiac concerns or symptoms  Goals Unmet:  Not Applicable  Comments: Doing well with exercise prescription progression.    Dr. Emily Filbert is Medical Director for [place] and [facility].

## 2015-08-18 ENCOUNTER — Encounter: Payer: Medicare Other | Admitting: *Deleted

## 2015-08-18 DIAGNOSIS — I214 Non-ST elevation (NSTEMI) myocardial infarction: Secondary | ICD-10-CM

## 2015-08-18 DIAGNOSIS — Z9861 Coronary angioplasty status: Secondary | ICD-10-CM

## 2015-08-18 NOTE — Progress Notes (Signed)
Daily Session Note  Patient Details  Name: Kyle [name] MRN: [id number] Date of Birth: May 15, 1931 Referring Provider:    Encounter Date: 08/18/2015  Check In:     Session Check In - 08/18/15 0906    Check-In   Staff Present Nyoka Cowden, RN;Susanne Bice, RN, BSN, CCRP;Rebecca Sickles, DPT, [place] physician immediately available to respond to emergencies See telemetry face sheet for immediately available ER MD   Medication changes reported     No   Fall or balance concerns reported    No   Warm-up and Cool-down Performed on first and last piece of equipment   VAD Patient? No   Pain Assessment   Currently in Pain? No/denies         Goals Met:  Exercise tolerated well No report of cardiac concerns or symptoms Strength training completed today  Goals Unmet:  Not Applicable  Comments: Doing well with exercise prescription progression.    Dr. Emily Filbert is Medical Director for [place] and [facility].

## 2015-08-21 ENCOUNTER — Encounter: Payer: Medicare Other | Attending: Internal Medicine | Admitting: *Deleted

## 2015-08-21 DIAGNOSIS — Z9861 Coronary angioplasty status: Secondary | ICD-10-CM

## 2015-08-21 DIAGNOSIS — I214 Non-ST elevation (NSTEMI) myocardial infarction: Secondary | ICD-10-CM | POA: Insufficient documentation

## 2015-08-21 NOTE — Progress Notes (Signed)
Daily Session Note  Patient Details  Name: Kyle [name] MRN: [id number] Date of Birth: 1931/07/28 Referring Provider:    Encounter Date: 08/21/2015  Check In:     Session Check In - 08/21/15 0828    Check-In   Location [facility]   Staff Present Earlean Shawl, BS, ACSM CEP, Exercise Physiologist;Flem Enderle, RN, BSN, CCRP;Carroll Enterkin, RN, BSN;Other   Supervising physician immediately available to respond to emergencies See telemetry face sheet for immediately available ER MD   Medication changes reported     No   Fall or balance concerns reported    No   Warm-up and Cool-down Performed on first and last piece of equipment   Resistance Training Performed Yes   VAD Patient? No   Pain Assessment   Currently in Pain? No/denies   Multiple Pain Sites No         Goals Met:  Exercise tolerated well No report of cardiac concerns or symptoms  Goals Unmet:  Not Applicable  Comments: Doing well with exercise prescription progression.    Dr. Emily Filbert is Medical Director for [place] and [facility].

## 2015-08-21 NOTE — Progress Notes (Signed)
Daily Session Note  Patient Details  Name: Kyle [name] MRN: [id number] Date of Birth: 08/11/1931 Referring Provider:    Encounter Date: 08/21/2015  Check In:     Session Check In - 08/21/15 0828    Check-In   Location [facility]   Staff Present Earlean Shawl, BS, ACSM CEP, Exercise Physiologist;Susanne Bice, RN, BSN, CCRP;Carroll Enterkin, RN, BSN;Other   Supervising physician immediately available to respond to emergencies See telemetry face sheet for immediately available ER MD   Medication changes reported     No   Fall or balance concerns reported    No   Warm-up and Cool-down Performed on first and last piece of equipment   Resistance Training Performed Yes   VAD Patient? No   Pain Assessment   Currently in Pain? No/denies   Multiple Pain Sites No         Goals Met:  Independence with exercise equipment Exercise tolerated well No report of cardiac concerns or symptoms Strength training completed today  Goals Unmet:  Not Applicable  Comments: Patient completed exercise prescription and all exercise goals during rehab session. The exercise was tolerated well and the patient is progressing in the program.     Dr. Emily Filbert is Medical Director for [place] and [facility].

## 2015-08-23 ENCOUNTER — Encounter: Payer: Medicare Other | Admitting: *Deleted

## 2015-08-23 DIAGNOSIS — I214 Non-ST elevation (NSTEMI) myocardial infarction: Secondary | ICD-10-CM | POA: Diagnosis not present

## 2015-08-23 NOTE — Progress Notes (Signed)
Daily Session Note  Patient Details  Name: Kyle [name] MRN: [id number] Date of Birth: 12-13-1931 Referring Provider:    Encounter Date: 08/23/2015  Check In:     Session Check In - 08/23/15 0822    Check-In   Location [facility]   Staff Present Heath Lark, RN, BSN, CCRP;Carroll Enterkin, RN, Jana Half, RN, BSN   Supervising physician immediately available to respond to emergencies See telemetry face sheet for immediately available ER MD   Medication changes reported     No   Fall or balance concerns reported    No   Warm-up and Cool-down Performed on first and last piece of equipment   Resistance Training Performed Yes   VAD Patient? No   Pain Assessment   Currently in Pain? No/denies         Goals Met:  Proper associated with RPD/PD & O2 Sat Independence with exercise equipment Exercise tolerated well No report of cardiac concerns or symptoms Strength training completed today  Goals Unmet:  Not Applicable  Comments:  Patient completed exercise prescription and all exercise goals during rehab session. The exercise was tolerated well and the patient is progressing in the program.    Dr. Emily Filbert is Medical Director for [place] and [facility].

## 2015-08-24 DIAGNOSIS — C61 Malignant neoplasm of prostate: Secondary | ICD-10-CM | POA: Diagnosis not present

## 2015-08-24 DIAGNOSIS — R351 Nocturia: Secondary | ICD-10-CM | POA: Diagnosis not present

## 2015-08-24 DIAGNOSIS — R3916 Straining to void: Secondary | ICD-10-CM | POA: Diagnosis not present

## 2015-08-24 DIAGNOSIS — D400 Neoplasm of uncertain behavior of prostate: Secondary | ICD-10-CM | POA: Diagnosis not present

## 2015-08-24 DIAGNOSIS — R350 Frequency of micturition: Secondary | ICD-10-CM | POA: Diagnosis not present

## 2015-08-24 DIAGNOSIS — R102 Pelvic and perineal pain: Secondary | ICD-10-CM | POA: Diagnosis not present

## 2015-08-24 DIAGNOSIS — N390 Urinary tract infection, site not specified: Secondary | ICD-10-CM | POA: Diagnosis not present

## 2015-08-24 DIAGNOSIS — R3914 Feeling of incomplete bladder emptying: Secondary | ICD-10-CM | POA: Diagnosis not present

## 2015-08-28 ENCOUNTER — Encounter: Payer: Medicare Other | Admitting: *Deleted

## 2015-08-28 DIAGNOSIS — I214 Non-ST elevation (NSTEMI) myocardial infarction: Secondary | ICD-10-CM

## 2015-08-28 DIAGNOSIS — Z9861 Coronary angioplasty status: Secondary | ICD-10-CM

## 2015-08-28 NOTE — Progress Notes (Signed)
Daily Session Note  Patient Details  Name: Kyle [name] MRN: [id number] Date of Birth: [birth year] Referring Provider:    Encounter Date: 08/28/2015  Check In:     Session Check In - 08/28/15 0824    Check-In   Location [facility]   Staff Present Calton Dach, RN, BSN, Laveda Norman, BS, ACSM CEP, Exercise Physiologist;Carroll Enterkin, RN, BSN   Supervising physician immediately available to respond to emergencies See telemetry face sheet for immediately available ER MD   Medication changes reported     Yes   Comments Temporary Antibiotic for UTI   Fall or balance concerns reported    No   Warm-up and Cool-down Performed on first and last piece of equipment   Resistance Training Performed Yes   VAD Patient? No   Pain Assessment   Currently in Pain? No/denies   Multiple Pain Sites No         Goals Met:  Independence with exercise equipment Exercise tolerated well No report of cardiac concerns or symptoms Strength training completed today  Goals Unmet:  Not Applicable  Comments: Patient completed exercise prescription and all exercise goals during rehab session. The exercise was tolerated well and the patient is progressing in the program.     Dr. Emily Filbert is Medical Director for [place] and [facility].

## 2015-08-30 ENCOUNTER — Encounter: Payer: Medicare Other | Admitting: *Deleted

## 2015-08-30 DIAGNOSIS — I214 Non-ST elevation (NSTEMI) myocardial infarction: Secondary | ICD-10-CM

## 2015-08-30 DIAGNOSIS — Z9861 Coronary angioplasty status: Secondary | ICD-10-CM

## 2015-08-30 NOTE — Progress Notes (Signed)
Daily Session Note  Patient Details  Name: KEL SENN MRN: [id number] Date of Birth: 1932-01-12 Referring Provider:    Encounter Date: 08/30/2015  Check In:     Session Check In - 08/30/15 0905    Check-In   Staff Present Heath Lark, RN, BSN, CCRP;Laureen Owens Shark, BS, RRT, Respiratory Therapist;Carroll Enterkin, RN, BSN   Supervising physician immediately available to respond to emergencies See telemetry face sheet for immediately available ER MD   Medication changes reported     No   Fall or balance concerns reported    No   Warm-up and Cool-down Performed on first and last piece of equipment   Resistance Training Performed Yes   Pain Assessment   Currently in Pain? No/denies           Exercise Prescription Changes - 08/30/15 0600    Exercise Review   Progression Yes   Response to Exercise   Blood Pressure (Admit) 138/62 mmHg   Blood Pressure (Exercise) 162/80 mmHg   Blood Pressure (Exit) 122/74 mmHg   Heart Rate (Admit) 65 bpm   Heart Rate (Exercise) 75 bpm   Heart Rate (Exit) 66 bpm   Rating of Perceived Exertion (Exercise) 11   Symptoms none   Duration Progress to 45 minutes of aerobic exercise without signs/symptoms of physical distress   Intensity Rest + 30   Progression   Progression Continue progressive overload as per policy without signs/symptoms or physical distress.   Resistance Training   Training Prescription Yes   Weight 3   Reps 10-15   Interval Training   Interval Training Yes   Equipment T5 Nustep;Biostep-RELP   Comments Use BS or T5 for interval training. RPE should range between 11-15 and intervals should be 2 min by 30 sec in duration. See specific workloads below.    Treadmill   MPH 2   Grade 0.5   Minutes 20   T5 Nustep   Level 2   Watts 30  intervals between 20-30 watts   Minutes 20   Biostep-RELP   Level 3   Watts 40  intervals between 30-40 watts.    Minutes 20   Home Exercise Plan   Frequency Add 1 additional day to  program exercise sessions.      Goals Met:  Exercise tolerated well Personal goals reviewed No report of cardiac concerns or symptoms Strength training completed today  Goals Unmet:  Not Applicable  Comments: Doing well with exercise prescription progression.    Dr. Emily Filbert is Medical Director for [place] and [facility].

## 2015-09-01 ENCOUNTER — Encounter: Payer: Medicare Other | Admitting: *Deleted

## 2015-09-01 DIAGNOSIS — I214 Non-ST elevation (NSTEMI) myocardial infarction: Secondary | ICD-10-CM

## 2015-09-01 NOTE — Progress Notes (Signed)
Daily Session Note  Patient Details  Name: Kyle [name] MRN: [id number] Date of Birth: [birth year] Referring Provider:    Encounter Date: 09/01/2015  Check In:     Session Check In - 09/01/15 0917    Check-In   Location [facility]   Staff Present Heath Lark, RN, BSN, CCRP;Martese Vanatta, RN, Moises Blood, BS, ACSM CEP, Exercise Physiologist   Supervising physician immediately available to respond to emergencies See telemetry face sheet for immediately available ER MD   Medication changes reported     No   Fall or balance concerns reported    No   Warm-up and Cool-down Performed on first and last piece of equipment   Resistance Training Performed Yes   VAD Patient? No   Pain Assessment   Currently in Pain? No/denies         Goals Met:  Proper associated with RPD/PD & O2 Sat Exercise tolerated well  Goals Unmet:  Not Applicable  Comments:    Dr. Emily Filbert is Medical Director for [place] and [facility].

## 2015-09-04 ENCOUNTER — Encounter: Payer: Medicare Other | Admitting: *Deleted

## 2015-09-04 DIAGNOSIS — I214 Non-ST elevation (NSTEMI) myocardial infarction: Secondary | ICD-10-CM

## 2015-09-04 DIAGNOSIS — Z9861 Coronary angioplasty status: Secondary | ICD-10-CM

## 2015-09-04 NOTE — Progress Notes (Signed)
Daily Session Note  Patient Details  Name: Kyle [name] MRN: [id number] Date of Birth: 1932/01/23 Referring Provider:    Encounter Date: 09/04/2015  Check In:     Session Check In - 09/04/15 0828    Check-In   Location [facility]   Staff Present Heath Lark, RN, BSN, CCRP;Carroll Enterkin, RN, Moises Blood, BS, ACSM CEP, Exercise Physiologist;Other   Supervising physician immediately available to respond to emergencies See telemetry face sheet for immediately available ER MD   Medication changes reported     No   Fall or balance concerns reported    No   Warm-up and Cool-down Performed on first and last piece of equipment   Resistance Training Performed Yes   VAD Patient? No   Pain Assessment   Currently in Pain? No/denies   Multiple Pain Sites No         Goals Met:  Independence with exercise equipment Exercise tolerated well No report of cardiac concerns or symptoms Strength training completed today  Goals Unmet:  Not Applicable  Comments: Patient completed exercise prescription and all exercise goals during rehab session. The exercise was tolerated well and the patient is progressing in the program.     Dr. Emily Filbert is Medical Director for [place] [facility].

## 2015-09-06 ENCOUNTER — Encounter: Payer: Medicare Other | Admitting: *Deleted

## 2015-09-06 DIAGNOSIS — Z9861 Coronary angioplasty status: Secondary | ICD-10-CM

## 2015-09-06 DIAGNOSIS — I214 Non-ST elevation (NSTEMI) myocardial infarction: Secondary | ICD-10-CM

## 2015-09-06 NOTE — Progress Notes (Signed)
Daily Session Note  Patient Details  Name: Kyle [name] MRN: [id number] Date of Birth: 10-03-1931 Referring Provider:    Encounter Date: 09/06/2015  Check In:     Session Check In - 09/06/15 0828    Check-In   Location [facility]   Staff Present Heath Lark, RN, BSN, CCRP;Carroll Enterkin, RN, BSN;Jessica Luan Pulling, MA, ACSM RCEP, Exercise Physiologist;Amanda Oletta Darter, BA, ACSM CEP, Exercise Physiologist;Diane Joya Gaskins, RN, BSN   Supervising physician immediately available to respond to emergencies See telemetry face sheet for immediately available ER MD   Medication changes reported     No   Fall or balance concerns reported    No   Warm-up and Cool-down Performed on first and last piece of equipment   Resistance Training Performed Yes   VAD Patient? No   Pain Assessment   Currently in Pain? No/denies         Goals Met:  Independence with exercise equipment Exercise tolerated well No report of cardiac concerns or symptoms Strength training completed today  Goals Unmet:  Not Applicable  Comments:  Patient completed exercise prescription and all exercise goals during rehab session. The exercise was tolerated well and the patient is progressing in the program.    Dr. Emily Filbert is Medical Director for [place] [facility].

## 2015-09-06 NOTE — Progress Notes (Signed)
Cardiac Individual Treatment Plan  Patient Details  Name: Kyle [name] MRN: [id number] Date of Birth: 1931/12/29 Referring Provider:    Initial Encounter Date:       Cardiac Rehab from 07/20/2015 in [facility] Cardiac and Pulmonary Rehab   Date  07/20/15      Visit Diagnosis: NSTEMI (non-ST elevated myocardial infarction) ([place])  S/P PTCA (percutaneous transluminal coronary angioplasty)  Patient's Home Medications on Admission:  Current outpatient prescriptions:  .  apixaban (ELIQUIS) 2.5 MG TABS tablet, Take 1 tablet (2.5 mg total) by mouth 2 (two) times daily., Disp: 60 tablet, Rfl: 5 .  atorvastatin (LIPITOR) 80 MG tablet, Take 1 tablet (80 mg total) by mouth daily at 6 PM., Disp: 30 tablet, Rfl: 11 .  clopidogrel (PLAVIX) 75 MG tablet, TAKE 1 TABLET BY MOUTH DAILY, Disp: 30 tablet, Rfl: 11 .  furosemide (LASIX) 20 MG tablet, Take 1 tablet (20 mg total) by mouth daily., Disp: 30 tablet, Rfl: 11 .  losartan (COZAAR) 50 MG tablet, Take 1 tablet (50 mg total) by mouth daily., Disp: 90 tablet, Rfl: 3 .  metoprolol tartrate (LOPRESSOR) 25 MG tablet, Take 1 tablet (25 mg total) by mouth 2 (two) times daily., Disp: 60 tablet, Rfl: 11 .  traMADol (ULTRAM) 50 MG tablet, TAKE ONE TABLET 3 TIMES DAILY AS NEEDED, Disp: 90 tablet, Rfl: 0  Past Medical History: Past Medical History  Diagnosis Date  . History of prostate cancer   . Hyperlipidemia   . Hypertension   . Arthritis   . Impaired fasting glucose   . Detached retina     right  . H/O dizziness   . Urgency of urination   . Cancer [facility])     prostate CA  . GERD (gastroesophageal reflux disease)   . Stroke [facility]) ~ 1995-02/2009 X 2    /notes 08/22/2010  . Claustrophobia     "EXTREMELY" (03/02/2015)    Tobacco Use: History  Smoking status  . Never Smoker   Smokeless tobacco  . Former Systems developer  . Types: Chew  . Quit date: 04/22/2004    Labs: Recent Review Flowsheet Data    Labs for ITP Cardiac and Pulmonary Rehab Latest Ref Rng  10/10/2010 04/16/2013 04/20/2014 05/10/2014 03/01/2015   Cholestrol 0 - 200 mg/dL 237(H) 194 222(H) - 194   LDLCALC 0 - 99 mg/dL - 128(H) 145(H) - 128(H)   LDLDIRECT - 164.4 - - - -   HDL >40 mg/dL 52.90 44.40 49.50 - 53   Trlycerides <150 mg/dL 182.0(H) 110.0 138.0 - 63   Hemoglobin A1c <5.7 % 6.7(H) - - 6.1(H) -       Exercise Target Goals:    Exercise Program Goal: Individual exercise prescription set with THRR, safety & activity barriers. Participant demonstrates ability to understand and report RPE using BORG scale, to self-measure pulse accurately, and to acknowledge the importance of the exercise prescription.  Exercise Prescription Goal: Starting with aerobic activity 30 plus minutes a day, 3 days per week for initial exercise prescription. Provide home exercise prescription and guidelines that participant acknowledges understanding prior to discharge.  Activity Barriers & Risk Stratification:     Activity Barriers & Cardiac Risk Stratification - 07/20/15 1253    Activity Barriers & Cardiac Risk Stratification   Activity Barriers Arthritis;Joint Problems;Shortness of Breath;Assistive Device   Cardiac Risk Stratification High      6 Minute Walk:     6 Minute Walk      07/20/15 1424  6 Minute Walk   Phase Initial     Distance 860 feet     Walk Time 6 minutes     RPE 15     Symptoms Yes (comment)  Kyle [name] c/o being light headed dizzy at the end of his 6 min walk -his blood pressure was 120/60 and no new ectopy.     Resting HR 62 bpm     Resting BP 138/60 mmHg     Max Ex. HR 92 bpm     Max Ex. BP 120/60 mmHg        Initial Exercise Prescription:     Initial Exercise Prescription - 07/20/15 1400    Date of Initial Exercise RX and Referring Provider   Date 07/20/15   Treadmill   MPH 1.2   Grade 0   Minutes 10   Recumbant Bike   Level 2   RPM 40   Watts 20   Minutes 10   NuStep   Level 2   Watts 40   Minutes 10   Arm Ergometer   Level 1   Watts  10   Minutes 10   REL-XR   Level 2   Watts 40   Minutes 10   T5 Nustep   Level 1   Watts 15   Minutes 10   Biostep-RELP   Level 2   Watts 40   Minutes 10   Prescription Details   Frequency (times per week) 3   Duration Progress to 30 minutes of continuous aerobic without signs/symptoms of physical distress   Intensity   THRR REST +  30   Ratings of Perceived Exertion 11-15   Perceived Dyspnea 0-4   Progression   Progression Continue progressive overload as per policy without signs/symptoms or physical distress.   Resistance Training   Training Prescription Yes   Weight 1   Reps 10-15      Perform Capillary Blood Glucose checks as needed.  Exercise Prescription Changes:     Exercise Prescription Changes      07/31/15 0800 08/30/15 0600         Exercise Review   Progression Yes Yes      Response to Exercise   Blood Pressure (Admit) 128/72 mmHg 138/62 mmHg      Blood Pressure (Exercise) 138/72 mmHg 162/80 mmHg      Blood Pressure (Exit) 130/70 mmHg 122/74 mmHg      Heart Rate (Admit) 59 bpm 65 bpm      Heart Rate (Exercise) 77 bpm 75 bpm      Heart Rate (Exit) 61 bpm 66 bpm      Rating of Perceived Exertion (Exercise) 11 11      Symptoms none none      Duration Progress to 30 minutes of continuous aerobic without signs/symptoms of physical distress Progress to 45 minutes of aerobic exercise without signs/symptoms of physical distress      Intensity Rest + 30 Rest + 30      Progression   Progression Continue progressive overload as per policy without signs/symptoms or physical distress. Continue progressive overload as per policy without signs/symptoms or physical distress.      Resistance Training   Training Prescription Yes Yes      Weight 3 3      Reps 10-15 10-15      Interval Training   Interval Training  Yes      Equipment  T5 Nustep;Biostep-RELP      Comments  Use BS  or T5 for interval training. RPE should range between 11-15 and intervals should be 2  min by 30 sec in duration. See specific workloads below.       Treadmill   MPH 1.6 2      Grade 0 0.5      Minutes 15 20      Recumbant Bike   Level --       RPM --       Watts --       Minutes --       NuStep   Level 2       Watts 50       Minutes 15       Arm Ergometer   Level --       Watts --       Minutes --       REL-XR   Level --       Watts --       Minutes --       T5 Nustep   Level -- 2      Watts -- 30  intervals between 20-30 watts      Minutes -- 20      Biostep-RELP   Level -- 3      Watts -- 40  intervals between 30-40 watts.       Minutes -- 20      Home Exercise Plan   Frequency  Add 1 additional day to program exercise sessions.         Exercise Comments:     Exercise Comments      07/31/15 0823 07/31/15 0828 07/31/15 1401 08/09/15 1308 08/28/15 1159   Exercise Comments Kyle [name]'s weight has been increased to 3 lbs for the strength training portion of the exercise class. This increase was discussed with the patient and and he tolerated it with no symptoms.  From his initial exercise prescription Kyle [name] has been consistantly using the TM and NS machines, and has increased his total exercise time to up to 35 min in duration. He is able to tolerate this duration without rest breaks and has been asymptomatic.  Kyle [name] [name] that he was been a little sore since starting his exercise program, but not to the point of inhibiting his daily activities.  His energy level is about the same but is hoping to gain energy from participating in a structured exercise program.  I faxed all Cardiac Rehab rhythm strips to Dr. Levon Hedger offiice today. No c/o. No s/s but junctional rhythm and muliptiple PVC's couplets. No s/s.  Kyle [name] that he is continuing to feel stronger and have more energy since being consistant with his exercise routine. There were no specific activities that he is wanting to achieve in his daily life besides continuing to feel better. Expected  outcome: continue to progress with workload intensities and duration in cardiac  rehab class.      08/30/15 0655 09/01/15 0917         Exercise Comments Reviewed individualized exercise prescription and made increases per departmental policy. Exercise increases were discussed with the patient and they were able to perform the new work loads without issue (no signs or symptoms).  Discussed interval training recommendations and benefits with the patient.  Kyle [name], Kyle [name]. Discussed options to exercise at [facility] in [facility] program.          Discharge Exercise Prescription (Final Exercise Prescription Changes):     Exercise Prescription Changes -  08/30/15 0600    Exercise Review   Progression Yes   Response to Exercise   Blood Pressure (Admit) 138/62 mmHg   Blood Pressure (Exercise) 162/80 mmHg   Blood Pressure (Exit) 122/74 mmHg   Heart Rate (Admit) 65 bpm   Heart Rate (Exercise) 75 bpm   Heart Rate (Exit) 66 bpm   Rating of Perceived Exertion (Exercise) 11   Symptoms none   Duration Progress to 45 minutes of aerobic exercise without signs/symptoms of physical distress   Intensity Rest + 30   Progression   Progression Continue progressive overload as per policy without signs/symptoms or physical distress.   Resistance Training   Training Prescription Yes   Weight 3   Reps 10-15   Interval Training   Interval Training Yes   Equipment T5 Nustep;Biostep-RELP   Comments Use BS or T5 for interval training. RPE should range between 11-15 and intervals should be 2 min by 30 sec in duration. See specific workloads below.    Treadmill   MPH 2   Grade 0.5   Minutes 20   T5 Nustep   Level 2   Watts 30  intervals between 20-30 watts   Minutes 20   Biostep-RELP   Level 3   Watts 40  intervals between 30-40 watts.    Minutes 20   Home Exercise Plan   Frequency Add 1 additional day to program exercise sessions.      Nutrition:  Target Goals: Understanding of  nutrition guidelines, daily intake of sodium <1571m, cholesterol <2065m calories 30% from fat and 7% or less from saturated fats, daily to have 5 or more servings of fruits and vegetables.  Biometrics:     Pre Biometrics - 07/20/15 1426    Pre Biometrics   Height 5' 10.9" (1.801 m)   Weight 196 lb 12.8 oz (89.268 kg)   Waist Circumference 41 inches   Hip Circumference 40.5 inches   Waist to Hip Ratio 1.01 %   BMI (Calculated) 27.6       Nutrition Therapy Plan and Nutrition Goals:     Nutrition Therapy & Goals - 07/20/15 1257    Nutrition Therapy   Drug/Food Interactions Statins/Certain Fruits   Intervention Plan   Intervention Prescribe, educate and counsel regarding individualized specific dietary modifications aiming towards targeted core components such as weight, hypertension, lipid management, diabetes, heart failure and other comorbidities.   Expected Outcomes Short Term Goal: Understand basic principles of dietary content, such as calories, fat, sodium, cholesterol and nutrients.;Long Term Goal: Adherence to prescribed nutrition plan.      Nutrition Discharge: Rate Your Plate Scores:     Nutrition Assessments - 09/01/15 0946    Rate Your Plate Scores   Post Score 74   Post Score % 68.06 %      Nutrition Goals Re-Evaluation:   Psychosocial: Target Goals: Acknowledge presence or absence of depression, maximize coping skills, provide positive support system. Participant is able to verbalize types and ability to use techniques and skills needed for reducing stress and depression.  Initial Review & Psychosocial Screening:     Initial Psych Review & Screening - 07/20/15 14EstoYes   Barriers   Psychosocial barriers to participate in program There are no identifiable barriers or psychosocial needs.      Quality of Life Scores:     Quality of Life - 09/01/15 1237    Quality of Life Scores   Health/Function Post  22.37 %   Socioeconomic Post 26.44 %   Psych/Spiritual Post 23.36 %   Family Post 21.5 %   GLOBAL Post 23.37 %      PHQ-9:     Recent Review Flowsheet Data    Depression screen [facility] 2/9 09/01/2015 07/20/2015 05/05/2015 04/20/2014 04/16/2013   Decreased Interest 2 3 0 0 0   Down, Depressed, Hopeless _0 0 0   PHQ - 2 Score _1 0 0   Altered sleeping 2 2 - - -   Tired, decreased energy 2 3 - - -   Change in appetite 0 0 - - -   Feeling bad or failure about yourself  1 3 - - -   Trouble concentrating 2 2 - - -   Moving slowly or fidgety/restless 0 3 - - -   Suicidal thoughts 0 0 - - -   PHQ-9 Score 11 19 - - -   Difficult doing work/chores Not difficult at all Somewhat difficult - - -      Psychosocial Evaluation and Intervention:     Psychosocial Evaluation - 07/26/15 0930    Psychosocial Evaluation & Interventions   Interventions Encouraged to exercise with the program and follow exercise prescription;Relaxation education;Stress management education   Comments Counselor met with Mr. Markie today for initial psychosocial evaluation.  He is a retired 82 year old who had a heart attack in November of 2016.  He has a strong support system with a spouse of 44 years, a son and sister who live close by and he is an active participant in his local church community.  Mr. Coufal has arthritis and several other health issues and reports he has not slept well for quite some time, other than the past 3 nights.  He states his appetite is good, and he denies a history of depression or anxiety.  However, Mr. Hlavacek participated in the Depression education done by this counselor today, and as a result admits to some current symptoms and his PHQ-9 was 19, which is moderately severe for depression.  Counselor assessed Mr. Zuckerman current mood and he [name] it has improved lately with walking more and sleeping better the past 3 nights.  He admits to stress being not able to do the things he likes to do,  such as working out in the yard and mowing.  Counselor encouraged Mr. Michele to speak with his doctor about his depressive symptoms if they do not continue to improve in the near future with exercise.  He agreed to do so.  This counselor will follow with Mr. Vultaggio on this and make recommendations accordingly.    Continued Psychosocial Services Needed --  Mr. swingler is encouraged to speak with his Dr. about his current depressive symptoms.  If these do not improve significantly in the near future with consistent exercise, counselor will make a referral to a therapist as well.        Psychosocial Re-Evaluation:     Psychosocial Re-Evaluation      08/16/15 1022           Psychosocial Re-Evaluation   Comments Follow up with Mr. Gartrell reporting an improvement in his mood and his sleep since he began this program.  He also states he is noticing an increase in his stamina as well.  Counselor commended him on this progress and will continue to follow as needed.           Vocational Rehabilitation: Provide vocational rehab  assistance to qualifying candidates.   Vocational Rehab Evaluation & Intervention:     Vocational Rehab - 07/20/15 1253    Initial Vocational Rehab Evaluation & Intervention   Assessment shows need for Vocational Rehabilitation No      Education: Education Goals: Education classes will be provided on a weekly basis, covering required topics. Participant will state understanding/return demonstration of topics presented.  Learning Barriers/Preferences:     Learning Barriers/Preferences - 07/20/15 1253    Learning Barriers/Preferences   Learning Barriers Sight   Learning Preferences Individual Instruction      Education Topics: General Nutrition Guidelines/Fats and Fiber: -Group instruction provided by verbal, written material, models and posters to present the general guidelines for heart healthy nutrition. Gives an explanation and review of dietary fats and  fiber.   Controlling Sodium/Reading Food Labels: -Group verbal and written material supporting the discussion of sodium use in heart healthy nutrition. Review and explanation with models, verbal and written materials for utilization of the food label.          Cardiac Rehab from 09/06/2015 in [facility] Cardiac and Pulmonary Rehab   Date  07/31/15   Educator  CR   Instruction Review Code  2- meets goals/outcomes      Exercise Physiology & Risk Factors: - Group verbal and written instruction with models to review the exercise physiology of the cardiovascular system and associated critical values. Details cardiovascular disease risk factors and the goals associated with each risk factor.      Cardiac Rehab from 09/06/2015 in [facility] Cardiac and Pulmonary Rehab   Date  08/09/15   Educator  BS   Instruction Review Code  2- meets goals/outcomes      Aerobic Exercise & Resistance Training: - Gives group verbal and written discussion on the health impact of inactivity. On the components of aerobic and resistive training programs and the benefits of this training and how to safely progress through these programs.      Cardiac Rehab from 09/06/2015 in [facility] Cardiac and Pulmonary Rehab   Date  08/14/15   Educator  RS   Instruction Review Code  2- meets goals/outcomes      Flexibility, Balance, General Exercise Guidelines: - Provides group verbal and written instruction on the benefits of flexibility and balance training programs. Provides general exercise guidelines with specific guidelines to those with heart or lung disease. Demonstration and skill practice provided.      Cardiac Rehab from 09/06/2015 in [facility] Cardiac and Pulmonary Rehab   Date  08/16/15   Educator  BS   Instruction Review Code  2- meets goals/outcomes      Stress Management: - Provides group verbal and written instruction about the health risks of elevated stress, cause of high stress, and healthy ways to reduce stress.       Cardiac Rehab from 09/06/2015 in [facility] Cardiac and Pulmonary Rehab   Date  08/23/15   Educator  Kathreen Cornfield, [facility]   Instruction Review Code  2- meets goals/outcomes      Depression: - Provides group verbal and written instruction on the correlation between heart/lung disease and depressed mood, treatment options, and the stigmas associated with seeking treatment.      Cardiac Rehab from 07/26/2015 in [facility] Cardiac and Pulmonary Rehab   Date  07/26/15   Educator  [facility]   Instruction Review Code  2- meets goals/outcomes      Anatomy & Physiology of the Heart: - Group verbal and written instruction and models provide  basic cardiac anatomy and physiology, with the coronary electrical and arterial systems. Review of: AMI, Angina, Valve disease, Heart Failure, Cardiac Arrhythmia, Pacemakers, and the ICD.      Cardiac Rehab from 09/06/2015 in [facility] Cardiac and Pulmonary Rehab   Date  08/21/15   Educator  SB   Instruction Review Code  2- meets goals/outcomes      Cardiac Procedures: - Group verbal and written instruction and models to describe the testing methods done to diagnose heart disease. Reviews the outcomes of the test results. Describes the treatment choices: Medical Management, Angioplasty, or Coronary Bypass Surgery.      Cardiac Rehab from 09/06/2015 in [facility] Cardiac and Pulmonary Rehab   Date  08/28/15   Educator  CE   Instruction Review Code  2- meets goals/outcomes      Cardiac Medications: - Group verbal and written instruction to review commonly prescribed medications for heart disease. Reviews the medication, class of the drug, and side effects. Includes the steps to properly store meds and [name] the prescription regimen.      Cardiac Rehab from 09/06/2015 in [facility] Cardiac and Pulmonary Rehab   Date  09/06/15   Educator  SB   Instruction Review Code  2- meets goals/outcomes [Part II:  Cardiac Medications]      Go Sex-Intimacy & Heart Disease, Get SMART - Goal Setting: - Group  verbal and written instruction through game format to discuss heart disease and the return to sexual intimacy. Provides group verbal and written material to discuss and apply goal setting through the application of the S.M.A.R.T. Method.      Cardiac Rehab from 09/06/2015 in [facility] Cardiac and Pulmonary Rehab   Date  08/28/15   Educator  CE   Instruction Review Code  2- meets goals/outcomes      Other Matters of the Heart: - Provides group verbal, written materials and models to describe Heart Failure, Angina, Valve Disease, and Diabetes in the realm of heart disease. Includes description of the disease process and treatment options available to the cardiac patient.      Cardiac Rehab from 09/06/2015 in [facility] Cardiac and Pulmonary Rehab   Date  08/07/15   Educator  SB   Instruction Review Code  2- meets goals/outcomes      Exercise & Equipment Safety: - Individual verbal instruction and demonstration of equipment use and safety with use of the equipment.      Cardiac Rehab from 09/06/2015 in [facility] Cardiac and Pulmonary Rehab   Date  07/28/15   Educator  C. EnterkinRN   Instruction Review Code  1- partially meets, needs review/practice Millea needs extra help with the Treadmill,[employer] inc speed too m]      Infection Prevention: - Provides verbal and written material to individual with discussion of infection control including proper hand washing and proper equipment cleaning during exercise session.      Cardiac Rehab from 07/26/2015 in [facility] Cardiac and Pulmonary Rehab   Date  07/20/15   Educator  C. EnterkinRN   Instruction Review Code  2- meets goals/outcomes      Falls Prevention: - Provides verbal and written material to individual with discussion of falls prevention and safety.      Cardiac Rehab from 07/26/2015 in [facility] Cardiac and Pulmonary Rehab   Date  07/20/15   Educator  C. [place]   Instruction Review Code  1- partially meets, needs review/practice      Diabetes: -  Individual verbal and written instruction to  review signs/symptoms of diabetes, desired ranges of glucose level fasting, after meals and with exercise. Advice that pre and post exercise glucose checks will be done for 3 sessions at entry of program.    Knowledge Questionnaire Score:     Knowledge Questionnaire Score - 09/01/15 1238    Knowledge Questionnaire Score   Post Score 18  24 was in error. Score was 18.      Core Components/Risk Factors/Patient Goals at Admission:     Personal Goals and Risk Factors at Admission - 07/20/15 1428    Core Components/Risk Factors/Patient Goals on Admission   Sedentary Yes   Intervention Provide advice, education, support and counseling about physical activity/exercise needs.;Develop an individualized exercise prescription for aerobic and resistive training based on initial evaluation findings, risk stratification, comorbidities and participant's personal goals.   Expected Outcomes Achievement of increased cardiorespiratory fitness and enhanced flexibility, muscular endurance and strength shown through measurements of functional capacity and personal statement of participant.      Core Components/Risk Factors/Patient Goals Review:      Goals and Risk Factor Review      08/02/15 0935 08/02/15 0940 08/04/15 0847 08/23/15 0935 08/23/15 0941   Core Components/Risk Factors/Patient Goals Review   Personal Goals Review Weight Management/Obesity;Hypertension  Lipids Sedentary Hypertension   Review Kyle [name] states he is trying to watch his weight, and is taking blood pressure medication as prescribed.  He has a blood pressure monitor at home and states he knows how to use it.  He states he has taken the group nutrition education class and does not wish to meet with the dietitian individually at this time.  Kyle [name] is compliant with his medications and will continue to follow the nutrition guidelines to help with cholesterol control Kyle [name] [name] one of his goals  in coming to cardiac rehab was to increase his heart rate and to increase energy/stamina.  Kyle [name] is increasing his heart rate while exercising.  Kyle [name] also explained that he has more overall energy, but he is not where he wants to be.  Kyle [name] plans to continue exercising  and coming to Cardiac Rehab.   Kyle [name] BP has been around 130/70 upon check in and 510C/585I systolic and 77O to 24M diastolic.    Expected Outcomes Kyle [name] will [name] weight and vital signs within acceptable parameters.  Individual session with dietitian will be made available if Kyle [name] chooses to pursue this option. Kyle [name] weight and vital signs within acceptable parameters.  Individual session with dietitian will be made available if Kyle [name] chooses to pursue this option. maintanence of lifestyle changes for cholesterol control Biran plans to continue exercising to increase his heart rate and to increase his energy.   Kyle [name] BP is controlled on current medication regimen.       09/06/15 1510           Core Components/Risk Factors/Patient Goals Review   Review I called Dr. Alla German office after I faxed heart rhythm strip from today with multiple PVC's. Kyle [name] has no c/o with his PVC's. Dr. Ron Agee office was going to call Dr. Vickki Muff office since they said did not refer Kyle [name] to Cardiac Rehab but Dr. Vickki Muff office did.           Core Components/Risk Factors/Patient Goals at Discharge (Final Review):      Goals and Risk Factor Review - 09/06/15 1510    Core Components/Risk Factors/Patient Goals Review   Review I called Dr. Alla German office after I faxed heart rhythm strip from today  with multiple PVC's. Kyle [name] has no c/o with his PVC's. Dr. Ron Agee office was going to call Dr. Vickki Muff office since they said did not refer Kyle [name] to Cardiac Rehab but Dr. Vickki Muff office did.       ITP Comments:     ITP Comments      07/20/15 1430 08/02/15 2820 08/02/15 0940 08/09/15 1306 08/13/15 0940   ITP Comments   Kyle [name] uses his walker at times so I encouraged him and got him to use his walker with his 6 minute walk in the hall.  Jazmine states he is trying to watch his weight, and is taking blood pressure medication as prescribed.  He has a blood pressure monitor at home and states he knows how to use it.  He states he has taken the group nutrition education class and does not wish to meet with the dietitian individually at this time Giorgio will [name] weight and vital signs within acceptable parameters.  Individual session with dietitian will be made available if Mihail chooses to pursue this option. I faxed all Cardiac Rehab rhythm strips to Dr. Levon Hedger offiice today. No c/o. No s/s but junctional rhythm and muliptiple PVC's couplets. No s/s.  30 day review.  Continue with ITP  New to program     08/28/15 0826 09/06/15 1505         ITP Comments Temporary medication change: on antibiotic for UTI I called Dr. Alla German office after I faxed heart rhythm strip from today with multiple PVC's. Bohdan has no c/o with his PVC's. Dr. Ron Agee office was going to call Dr. Vickki Muff office since they said did not refer Jamere to Cardiac Rehab but Dr. Vickki Muff office did.          Comments:

## 2015-09-08 ENCOUNTER — Telehealth: Payer: Self-pay | Admitting: *Deleted

## 2015-09-08 ENCOUNTER — Encounter: Payer: Self-pay | Admitting: Cardiology

## 2015-09-08 DIAGNOSIS — I214 Non-ST elevation (NSTEMI) myocardial infarction: Secondary | ICD-10-CM | POA: Diagnosis not present

## 2015-09-08 DIAGNOSIS — Z9861 Coronary angioplasty status: Secondary | ICD-10-CM

## 2015-09-08 NOTE — Telephone Encounter (Signed)
Received a call from Arbie Cookey at [facility]  Patient has been having lots of PVC's and couplets during rehab Today patient finally admitted to "not feeling well" during those time He does have 2 cups of coffee in am prior to rehab He will stop the coffee This has been going on for about a month Did receive fax from rehab, [place] aware and on her desk for Dr Percival Spanish to review Monday when back in the office

## 2015-09-08 NOTE — Progress Notes (Signed)
Daily Session Note  Patient Details  Name: Kyle [name] MRN: [id number] Date of Birth: [birth year] Referring Provider:    Encounter Date: 09/08/2015  Check In:     Session Check In - 09/08/15 0911    Check-In   Location [facility]   Staff Present Heath Lark, RN, BSN, CCRP;Jessica Luan Pulling, MA, ACSM RCEP, Exercise Physiologist;Tyrianna Lightle Oletta Darter, BA, ACSM CEP, Exercise Physiologist;Carroll Enterkin, RN, BSN   Supervising physician immediately available to respond to emergencies See telemetry face sheet for immediately available ER MD   Medication changes reported     No   Fall or balance concerns reported    No   Warm-up and Cool-down Performed on first and last piece of equipment   Resistance Training Performed Yes   VAD Patient? No   Pain Assessment   Currently in Pain? No/denies         Goals Met:  Independence with exercise equipment Exercise tolerated well No report of cardiac concerns or symptoms Strength training completed today  Goals Unmet:  Not Applicable  Comments: [name] is progressing well with exercise   Dr. Emily Filbert is Medical Director for [facility].

## 2015-09-10 ENCOUNTER — Encounter: Payer: Self-pay | Admitting: *Deleted

## 2015-09-10 DIAGNOSIS — Z9861 Coronary angioplasty status: Secondary | ICD-10-CM

## 2015-09-10 DIAGNOSIS — I214 Non-ST elevation (NSTEMI) myocardial infarction: Secondary | ICD-10-CM

## 2015-09-10 NOTE — Progress Notes (Signed)
Cardiac Individual Treatment Plan  Patient Details  Name: Kyle [name] MRN: [id number] Date of Birth: 1931/12/29 Referring Provider:    Initial Encounter Date:       Cardiac Rehab from 07/20/2015 in [facility] Cardiac and Pulmonary Rehab   Date  07/20/15      Visit Diagnosis: NSTEMI (non-ST elevated myocardial infarction) ([place])  S/P PTCA (percutaneous transluminal coronary angioplasty)  Patient's Home Medications on Admission:  Current outpatient prescriptions:  .  apixaban (ELIQUIS) 2.5 MG TABS tablet, Take 1 tablet (2.5 mg total) by mouth 2 (two) times daily., Disp: 60 tablet, Rfl: 5 .  atorvastatin (LIPITOR) 80 MG tablet, Take 1 tablet (80 mg total) by mouth daily at 6 PM., Disp: 30 tablet, Rfl: 11 .  clopidogrel (PLAVIX) 75 MG tablet, TAKE 1 TABLET BY MOUTH DAILY, Disp: 30 tablet, Rfl: 11 .  furosemide (LASIX) 20 MG tablet, Take 1 tablet (20 mg total) by mouth daily., Disp: 30 tablet, Rfl: 11 .  losartan (COZAAR) 50 MG tablet, Take 1 tablet (50 mg total) by mouth daily., Disp: 90 tablet, Rfl: 3 .  metoprolol tartrate (LOPRESSOR) 25 MG tablet, Take 1 tablet (25 mg total) by mouth 2 (two) times daily., Disp: 60 tablet, Rfl: 11 .  traMADol (ULTRAM) 50 MG tablet, TAKE ONE TABLET 3 TIMES DAILY AS NEEDED, Disp: 90 tablet, Rfl: 0  Past Medical History: Past Medical History  Diagnosis Date  . History of prostate cancer   . Hyperlipidemia   . Hypertension   . Arthritis   . Impaired fasting glucose   . Detached retina     right  . H/O dizziness   . Urgency of urination   . Cancer [facility])     prostate CA  . GERD (gastroesophageal reflux disease)   . Stroke [facility]) ~ 1995-02/2009 X 2    /notes 08/22/2010  . Claustrophobia     "EXTREMELY" (03/02/2015)    Tobacco Use: History  Smoking status  . Never Smoker   Smokeless tobacco  . Former Systems developer  . Types: Chew  . Quit date: 04/22/2004    Labs: Recent Review Flowsheet Data    Labs for ITP Cardiac and Pulmonary Rehab Latest Ref Rng  10/10/2010 04/16/2013 04/20/2014 05/10/2014 03/01/2015   Cholestrol 0 - 200 mg/dL 237(H) 194 222(H) - 194   LDLCALC 0 - 99 mg/dL - 128(H) 145(H) - 128(H)   LDLDIRECT - 164.4 - - - -   HDL >40 mg/dL 52.90 44.40 49.50 - 53   Trlycerides <150 mg/dL 182.0(H) 110.0 138.0 - 63   Hemoglobin A1c <5.7 % 6.7(H) - - 6.1(H) -       Exercise Target Goals:    Exercise Program Goal: Individual exercise prescription set with THRR, safety & activity barriers. Participant demonstrates ability to understand and report RPE using BORG scale, to self-measure pulse accurately, and to acknowledge the importance of the exercise prescription.  Exercise Prescription Goal: Starting with aerobic activity 30 plus minutes a day, 3 days per week for initial exercise prescription. Provide home exercise prescription and guidelines that participant acknowledges understanding prior to discharge.  Activity Barriers & Risk Stratification:     Activity Barriers & Cardiac Risk Stratification - 07/20/15 1253    Activity Barriers & Cardiac Risk Stratification   Activity Barriers Arthritis;Joint Problems;Shortness of Breath;Assistive Device   Cardiac Risk Stratification High      6 Minute Walk:     6 Minute Walk      07/20/15 1424  6 Minute Walk   Phase Initial     Distance 860 feet     Walk Time 6 minutes     RPE 15     Symptoms Yes (comment)  Dixie c/o being light headed dizzy at the end of his 6 min walk -his blood pressure was 120/60 and no new ectopy.     Resting HR 62 bpm     Resting BP 138/60 mmHg     Max Ex. HR 92 bpm     Max Ex. BP 120/60 mmHg        Initial Exercise Prescription:     Initial Exercise Prescription - 07/20/15 1400    Date of Initial Exercise RX and Referring Provider   Date 07/20/15   Treadmill   MPH 1.2   Grade 0   Minutes 10   Recumbant Bike   Level 2   RPM 40   Watts 20   Minutes 10   NuStep   Level 2   Watts 40   Minutes 10   Arm Ergometer   Level 1   Watts  10   Minutes 10   REL-XR   Level 2   Watts 40   Minutes 10   T5 Nustep   Level 1   Watts 15   Minutes 10   Biostep-RELP   Level 2   Watts 40   Minutes 10   Prescription Details   Frequency (times per week) 3   Duration Progress to 30 minutes of continuous aerobic without signs/symptoms of physical distress   Intensity   THRR REST +  30   Ratings of Perceived Exertion 11-15   Perceived Dyspnea 0-4   Progression   Progression Continue progressive overload as per policy without signs/symptoms or physical distress.   Resistance Training   Training Prescription Yes   Weight 1   Reps 10-15      Perform Capillary Blood Glucose checks as needed.  Exercise Prescription Changes:     Exercise Prescription Changes      07/31/15 0800 08/30/15 0600         Exercise Review   Progression Yes Yes      Response to Exercise   Blood Pressure (Admit) 128/72 mmHg 138/62 mmHg      Blood Pressure (Exercise) 138/72 mmHg 162/80 mmHg      Blood Pressure (Exit) 130/70 mmHg 122/74 mmHg      Heart Rate (Admit) 59 bpm 65 bpm      Heart Rate (Exercise) 77 bpm 75 bpm      Heart Rate (Exit) 61 bpm 66 bpm      Rating of Perceived Exertion (Exercise) 11 11      Symptoms none none      Duration Progress to 30 minutes of continuous aerobic without signs/symptoms of physical distress Progress to 45 minutes of aerobic exercise without signs/symptoms of physical distress      Intensity Rest + 30 Rest + 30      Progression   Progression Continue progressive overload as per policy without signs/symptoms or physical distress. Continue progressive overload as per policy without signs/symptoms or physical distress.      Resistance Training   Training Prescription Yes Yes      Weight 3 3      Reps 10-15 10-15      Interval Training   Interval Training  Yes      Equipment  T5 Nustep;Biostep-RELP      Comments  Use BS  or T5 for interval training. RPE should range between 11-15 and intervals should be 2  min by 30 sec in duration. See specific workloads below.       Treadmill   MPH 1.6 2      Grade 0 0.5      Minutes 15 20      Recumbant Bike   Level --       RPM --       Watts --       Minutes --       NuStep   Level 2       Watts 50       Minutes 15       Arm Ergometer   Level --       Watts --       Minutes --       REL-XR   Level --       Watts --       Minutes --       T5 Nustep   Level -- 2      Watts -- 30  intervals between 20-30 watts      Minutes -- 20      Biostep-RELP   Level -- 3      Watts -- 40  intervals between 30-40 watts.       Minutes -- 20      Home Exercise Plan   Frequency  Add 1 additional day to program exercise sessions.         Exercise Comments:     Exercise Comments      07/31/15 0823 07/31/15 0828 07/31/15 1401 08/09/15 1308 08/28/15 1159   Exercise Comments Jordynn's weight has been increased to 3 lbs for the strength training portion of the exercise class. This increase was discussed with the patient and and he tolerated it with no symptoms.  From his initial exercise prescription Erven has been consistantly using the TM and NS machines, and has increased his total exercise time to up to 35 min in duration. He is able to tolerate this duration without rest breaks and has been asymptomatic.  Ollen stated that he was been a little sore since starting his exercise program, but not to the point of inhibiting his daily activities.  His energy level is about the same but is hoping to gain energy from participating in a structured exercise program.  I faxed all Cardiac Rehab rhythm strips to Dr. Levon Hedger offiice today. No c/o. No s/s but junctional rhythm and muliptiple PVC's couplets. No s/s.  Celia stated that he is continuing to feel stronger and have more energy since being consistant with his exercise routine. There were no specific activities that he is wanting to achieve in his daily life besides continuing to feel better. Expected  outcome: continue to progress with workload intensities and duration in cardiac  rehab class.      08/30/15 0655 09/01/15 0917         Exercise Comments Reviewed individualized exercise prescription and made increases per departmental policy. Exercise increases were discussed with the patient and they were able to perform the new work loads without issue (no signs or symptoms).  Discussed interval training recommendations and benefits with the patient.  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 Discussed options to exercise at [facility] in [facility] program.          Discharge Exercise Prescription (Final Exercise Prescription Changes):     Exercise Prescription Changes -  08/30/15 0600    Exercise Review   Progression Yes   Response to Exercise   Blood Pressure (Admit) 138/62 mmHg   Blood Pressure (Exercise) 162/80 mmHg   Blood Pressure (Exit) 122/74 mmHg   Heart Rate (Admit) 65 bpm   Heart Rate (Exercise) 75 bpm   Heart Rate (Exit) 66 bpm   Rating of Perceived Exertion (Exercise) 11   Symptoms none   Duration Progress to 45 minutes of aerobic exercise without signs/symptoms of physical distress   Intensity Rest + 30   Progression   Progression Continue progressive overload as per policy without signs/symptoms or physical distress.   Resistance Training   Training Prescription Yes   Weight 3   Reps 10-15   Interval Training   Interval Training Yes   Equipment T5 Nustep;Biostep-RELP   Comments Use BS or T5 for interval training. RPE should range between 11-15 and intervals should be 2 min by 30 sec in duration. See specific workloads below.    Treadmill   MPH 2   Grade 0.5   Minutes 20   T5 Nustep   Level 2   Watts 30  intervals between 20-30 watts   Minutes 20   Biostep-RELP   Level 3   Watts 40  intervals between 30-40 watts.    Minutes 20   Home Exercise Plan   Frequency Add 1 additional day to program exercise sessions.      Nutrition:  Target Goals: Understanding of  nutrition guidelines, daily intake of sodium <1571m, cholesterol <2065m calories 30% from fat and 7% or less from saturated fats, daily to have 5 or more servings of fruits and vegetables.  Biometrics:     Pre Biometrics - 07/20/15 1426    Pre Biometrics   Height 5' 10.9" (1.801 m)   Weight 196 lb 12.8 oz (89.268 kg)   Waist Circumference 41 inches   Hip Circumference 40.5 inches   Waist to Hip Ratio 1.01 %   BMI (Calculated) 27.6       Nutrition Therapy Plan and Nutrition Goals:     Nutrition Therapy & Goals - 07/20/15 1257    Nutrition Therapy   Drug/Food Interactions Statins/Certain Fruits   Intervention Plan   Intervention Prescribe, educate and counsel regarding individualized specific dietary modifications aiming towards targeted core components such as weight, hypertension, lipid management, diabetes, heart failure and other comorbidities.   Expected Outcomes Short Term Goal: Understand basic principles of dietary content, such as calories, fat, sodium, cholesterol and nutrients.;Long Term Goal: Adherence to prescribed nutrition plan.      Nutrition Discharge: Rate Your Plate Scores:     Nutrition Assessments - 09/01/15 0946    Rate Your Plate Scores   Post Score 74   Post Score % 68.06 %      Nutrition Goals Re-Evaluation:   Psychosocial: Target Goals: Acknowledge presence or absence of depression, maximize coping skills, provide positive support system. Participant is able to verbalize types and ability to use techniques and skills needed for reducing stress and depression.  Initial Review & Psychosocial Screening:     Initial Psych Review & Screening - 07/20/15 14EstoYes   Barriers   Psychosocial barriers to participate in program There are no identifiable barriers or psychosocial needs.      Quality of Life Scores:     Quality of Life - 09/01/15 1237    Quality of Life Scores   Health/Function Post  22.37 %   Socioeconomic Post 26.44 %   Psych/Spiritual Post 23.36 %   Family Post 21.5 %   GLOBAL Post 23.37 %      PHQ-9:     Recent Review Flowsheet Data    Depression screen [facility] 2/9 09/01/2015 07/20/2015 05/05/2015 04/20/2014 04/16/2013   Decreased Interest 2 3 0 0 0   Down, Depressed, Hopeless _0 0 0   PHQ - 2 Score _1 0 0   Altered sleeping 2 2 - - -   Tired, decreased energy 2 3 - - -   Change in appetite 0 0 - - -   Feeling bad or failure about yourself  1 3 - - -   Trouble concentrating 2 2 - - -   Moving slowly or fidgety/restless 0 3 - - -   Suicidal thoughts 0 0 - - -   PHQ-9 Score 11 19 - - -   Difficult doing work/chores Not difficult at all Somewhat difficult - - -      Psychosocial Evaluation and Intervention:     Psychosocial Evaluation - 07/26/15 0930    Psychosocial Evaluation & Interventions   Interventions Encouraged to exercise with the program and follow exercise prescription;Relaxation education;Stress management education   Comments Counselor met with Mr. Markie today for initial psychosocial evaluation.  He is a retired 82 year old who had a heart attack in November of 2016.  He has a strong support system with a spouse of 44 years, a son and sister who live close by and he is an active participant in his local church community.  Mr. Coufal has arthritis and several other health issues and reports he has not slept well for quite some time, other than the past 3 nights.  He states his appetite is good, and he denies a history of depression or anxiety.  However, Mr. Hlavacek participated in the Depression education done by this counselor today, and as a result admits to some current symptoms and his PHQ-9 was 19, which is moderately severe for depression.  Counselor assessed Mr. Zuckerman current mood and he stated it has improved lately with walking more and sleeping better the past 3 nights.  He admits to stress being not able to do the things he likes to do,  such as working out in the yard and mowing.  Counselor encouraged Mr. Michele to speak with his doctor about his depressive symptoms if they do not continue to improve in the near future with exercise.  He agreed to do so.  This counselor will follow with Mr. Vultaggio on this and make recommendations accordingly.    Continued Psychosocial Services Needed --  Mr. swingler is encouraged to speak with his Dr. about his current depressive symptoms.  If these do not improve significantly in the near future with consistent exercise, counselor will make a referral to a therapist as well.        Psychosocial Re-Evaluation:     Psychosocial Re-Evaluation      08/16/15 1022           Psychosocial Re-Evaluation   Comments Follow up with Mr. Gartrell reporting an improvement in his mood and his sleep since he began this program.  He also states he is noticing an increase in his stamina as well.  Counselor commended him on this progress and will continue to follow as needed.           Vocational Rehabilitation: Provide vocational rehab  assistance to qualifying candidates.   Vocational Rehab Evaluation & Intervention:     Vocational Rehab - 07/20/15 1253    Initial Vocational Rehab Evaluation & Intervention   Assessment shows need for Vocational Rehabilitation No      Education: Education Goals: Education classes will be provided on a weekly basis, covering required topics. Participant will state understanding/return demonstration of topics presented.  Learning Barriers/Preferences:     Learning Barriers/Preferences - 07/20/15 1253    Learning Barriers/Preferences   Learning Barriers Sight   Learning Preferences Individual Instruction      Education Topics: General Nutrition Guidelines/Fats and Fiber: -Group instruction provided by verbal, written material, models and posters to present the general guidelines for heart healthy nutrition. Gives an explanation and review of dietary fats and  fiber.   Controlling Sodium/Reading Food Labels: -Group verbal and written material supporting the discussion of sodium use in heart healthy nutrition. Review and explanation with models, verbal and written materials for utilization of the food label.          Cardiac Rehab from 09/06/2015 in [facility] Cardiac and Pulmonary Rehab   Date  07/31/15   Educator  CR   Instruction Review Code  2- meets goals/outcomes      Exercise Physiology & Risk Factors: - Group verbal and written instruction with models to review the exercise physiology of the cardiovascular system and associated critical values. Details cardiovascular disease risk factors and the goals associated with each risk factor.      Cardiac Rehab from 09/06/2015 in [facility] Cardiac and Pulmonary Rehab   Date  08/09/15   Educator  BS   Instruction Review Code  2- meets goals/outcomes      Aerobic Exercise & Resistance Training: - Gives group verbal and written discussion on the health impact of inactivity. On the components of aerobic and resistive training programs and the benefits of this training and how to safely progress through these programs.      Cardiac Rehab from 09/06/2015 in [facility] Cardiac and Pulmonary Rehab   Date  08/14/15   Educator  RS   Instruction Review Code  2- meets goals/outcomes      Flexibility, Balance, General Exercise Guidelines: - Provides group verbal and written instruction on the benefits of flexibility and balance training programs. Provides general exercise guidelines with specific guidelines to those with heart or lung disease. Demonstration and skill practice provided.      Cardiac Rehab from 09/06/2015 in [facility] Cardiac and Pulmonary Rehab   Date  08/16/15   Educator  BS   Instruction Review Code  2- meets goals/outcomes      Stress Management: - Provides group verbal and written instruction about the health risks of elevated stress, cause of high stress, and healthy ways to reduce stress.       Cardiac Rehab from 09/06/2015 in [facility] Cardiac and Pulmonary Rehab   Date  08/23/15   Educator  Kathreen Cornfield, [facility]   Instruction Review Code  2- meets goals/outcomes      Depression: - Provides group verbal and written instruction on the correlation between heart/lung disease and depressed mood, treatment options, and the stigmas associated with seeking treatment.      Cardiac Rehab from 07/26/2015 in [facility] Cardiac and Pulmonary Rehab   Date  07/26/15   Educator  [facility]   Instruction Review Code  2- meets goals/outcomes      Anatomy & Physiology of the Heart: - Group verbal and written instruction and models provide  basic cardiac anatomy and physiology, with the coronary electrical and arterial systems. Review of: AMI, Angina, Valve disease, Heart Failure, Cardiac Arrhythmia, Pacemakers, and the ICD.      Cardiac Rehab from 09/06/2015 in [facility] Cardiac and Pulmonary Rehab   Date  08/21/15   Educator  SB   Instruction Review Code  2- meets goals/outcomes      Cardiac Procedures: - Group verbal and written instruction and models to describe the testing methods done to diagnose heart disease. Reviews the outcomes of the test results. Describes the treatment choices: Medical Management, Angioplasty, or Coronary Bypass Surgery.      Cardiac Rehab from 09/06/2015 in [facility] Cardiac and Pulmonary Rehab   Date  08/28/15   Educator  CE   Instruction Review Code  2- meets goals/outcomes      Cardiac Medications: - Group verbal and written instruction to review commonly prescribed medications for heart disease. Reviews the medication, class of the drug, and side effects. Includes the steps to properly store meds and maintain the prescription regimen.      Cardiac Rehab from 09/06/2015 in [facility] Cardiac and Pulmonary Rehab   Date  09/06/15   Educator  SB   Instruction Review Code  2- meets goals/outcomes [Part II:  Cardiac Medications]      Go Sex-Intimacy & Heart Disease, Get SMART - Goal Setting: - Group  verbal and written instruction through game format to discuss heart disease and the return to sexual intimacy. Provides group verbal and written material to discuss and apply goal setting through the application of the S.M.A.R.T. Method.      Cardiac Rehab from 09/06/2015 in [facility] Cardiac and Pulmonary Rehab   Date  08/28/15   Educator  CE   Instruction Review Code  2- meets goals/outcomes      Other Matters of the Heart: - Provides group verbal, written materials and models to describe Heart Failure, Angina, Valve Disease, and Diabetes in the realm of heart disease. Includes description of the disease process and treatment options available to the cardiac patient.      Cardiac Rehab from 09/06/2015 in [facility] Cardiac and Pulmonary Rehab   Date  08/07/15   Educator  SB   Instruction Review Code  2- meets goals/outcomes      Exercise & Equipment Safety: - Individual verbal instruction and demonstration of equipment use and safety with use of the equipment.      Cardiac Rehab from 09/06/2015 in [facility] Cardiac and Pulmonary Rehab   Date  07/28/15   Educator  C. EnterkinRN   Instruction Review Code  1- partially meets, needs review/practice Millea needs extra help with the Treadmill,[employer] speed too m]      Infection Prevention: - Provides verbal and written material to individual with discussion of infection control including proper hand washing and proper equipment cleaning during exercise session.      Cardiac Rehab from 07/26/2015 in [facility] Cardiac and Pulmonary Rehab   Date  07/20/15   Educator  C. EnterkinRN   Instruction Review Code  2- meets goals/outcomes      Falls Prevention: - Provides verbal and written material to individual with discussion of falls prevention and safety.      Cardiac Rehab from 07/26/2015 in [facility] Cardiac and Pulmonary Rehab   Date  07/20/15   Educator  C. [place]   Instruction Review Code  1- partially meets, needs review/practice      Diabetes: -  Individual verbal and written instruction to  review signs/symptoms of diabetes, desired ranges of glucose level fasting, after meals and with exercise. Advice that pre and post exercise glucose checks will be done for 3 sessions at entry of program.    Knowledge Questionnaire Score:     Knowledge Questionnaire Score - 09/01/15 1238    Knowledge Questionnaire Score   Post Score 18  24 was in error. Score was 18.      Core Components/Risk Factors/Patient Goals at Admission:     Personal Goals and Risk Factors at Admission - 07/20/15 1428    Core Components/Risk Factors/Patient Goals on Admission   Sedentary Yes   Intervention Provide advice, education, support and counseling about physical activity/exercise needs.;Develop an individualized exercise prescription for aerobic and resistive training based on initial evaluation findings, risk stratification, comorbidities and participant's personal goals.   Expected Outcomes Achievement of increased cardiorespiratory fitness and enhanced flexibility, muscular endurance and strength shown through measurements of functional capacity and personal statement of participant.      Core Components/Risk Factors/Patient Goals Review:      Goals and Risk Factor Review      08/02/15 0935 08/02/15 0940 08/04/15 0847 08/23/15 0935 08/23/15 0941   Core Components/Risk Factors/Patient Goals Review   Personal Goals Review Weight Management/Obesity;Hypertension  Lipids Sedentary Hypertension   Review Keeghan states he is trying to watch his weight, and is taking blood pressure medication as prescribed.  He has a blood pressure monitor at home and states he knows how to use it.  He states he has taken the group nutrition education class and does not wish to meet with the dietitian individually at this time.  Markon is compliant with his medications and will continue to follow the nutrition guidelines to help with cholesterol control Rolla stated one of his goals  in coming to cardiac rehab was to increase his heart rate and to increase energy/stamina.  Malcom is increasing his heart rate while exercising.  Edelmiro also explained that he has more overall energy, but he is not where he wants to be.  Jahaan plans to continue exercising  and coming to Cardiac Rehab.   Lonald's BP has been around 130/70 upon check in and 510C/585I systolic and 77O to 24M diastolic.    Expected Outcomes Jonatha will maintain weight and vital signs within acceptable parameters.  Individual session with dietitian will be made available if Gjon chooses to pursue this option. Dionysios will maintain weight and vital signs within acceptable parameters.  Individual session with dietitian will be made available if Shields chooses to pursue this option. maintanence of lifestyle changes for cholesterol control Biran plans to continue exercising to increase his heart rate and to increase his energy.   Stran's BP is controlled on current medication regimen.       09/06/15 1510           Core Components/Risk Factors/Patient Goals Review   Review I called Dr. Alla German office after I faxed heart rhythm strip from today with multiple PVC's. Jabar has no c/o with his PVC's. Dr. Ron Agee office was going to call Dr. Vickki Muff office since they said did not refer Reedy to Cardiac Rehab but Dr. Vickki Muff office did.           Core Components/Risk Factors/Patient Goals at Discharge (Final Review):      Goals and Risk Factor Review - 09/06/15 1510    Core Components/Risk Factors/Patient Goals Review   Review I called Dr. Alla German office after I faxed heart rhythm strip from today  with multiple PVC's. Raymont has no c/o with his PVC's. Dr. Ron Agee office was going to call Dr. Vickki Muff office since they said did not refer Densil to Cardiac Rehab but Dr. Vickki Muff office did.       ITP Comments:     ITP Comments      07/20/15 1430 08/02/15 [id number] 08/02/15 0940 08/09/15 1306 08/13/15 0940   ITP Comments   Brenn uses his walker at times so I encouraged him and got him to use his walker with his 6 minute walk in the hall.  Josey states he is trying to watch his weight, and is taking blood pressure medication as prescribed.  He has a blood pressure monitor at home and states he knows how to use it.  He states he has taken the group nutrition education class and does not wish to meet with the dietitian individually at this time Abdulhadi will maintain weight and vital signs within acceptable parameters.  Individual session with dietitian will be made available if Uel chooses to pursue this option. I faxed all Cardiac Rehab rhythm strips to Dr. Levon Hedger offiice today. No c/o. No s/s but junctional rhythm and muliptiple PVC's couplets. No s/s.  30 day review.  Continue with ITP  New to program     08/28/15 0826 09/06/15 1505 09/10/15 1150       ITP Comments Temporary medication change: on antibiotic for UTI I called Dr. Alla German office after I faxed heart rhythm strip from today with multiple PVC's. Lakai has no c/o with his PVC's. Dr. Ron Agee office was going to call Dr. Vickki Muff office since they said did not refer Eleno to Cardiac Rehab but Dr. Vickki Muff office did.  30 day review. Continue with ITP        Comments:

## 2015-09-11 ENCOUNTER — Encounter: Payer: Medicare Other | Admitting: *Deleted

## 2015-09-11 ENCOUNTER — Telehealth: Payer: Self-pay | Admitting: Cardiology

## 2015-09-11 VITALS — Ht 70.9 in | Wt 195.0 lb

## 2015-09-11 DIAGNOSIS — I214 Non-ST elevation (NSTEMI) myocardial infarction: Secondary | ICD-10-CM | POA: Diagnosis not present

## 2015-09-11 DIAGNOSIS — Z9861 Coronary angioplasty status: Secondary | ICD-10-CM

## 2015-09-11 NOTE — Telephone Encounter (Signed)
See Melinda's note from 5/19.

## 2015-09-11 NOTE — Progress Notes (Signed)
Daily Session Note  Patient Details  Name: Kyle [name] MRN: [id number] Date of Birth: 08/05/1931 Referring Provider:    Encounter Date: 09/11/2015  Check In:     Session Check In - 09/11/15 0840    Check-In   Location [facility]   Staff Present Mary Jo Abernethy, RN; , BS, ACSM CEP, Exercise Physiologist;Carroll Enterkin, RN, BSN   Supervising physician immediately available to respond to emergencies See telemetry face sheet for immediately available ER MD   Medication changes reported     No   Fall or balance concerns reported    No   Warm-up and Cool-down Performed on first and last piece of equipment   Resistance Training Performed Yes   VAD Patient? No   Pain Assessment   Currently in Pain? No/denies   Multiple Pain Sites No         Goals Met:  Independence with exercise equipment Exercise tolerated well No report of cardiac concerns or symptoms Strength training completed today  Goals Unmet:  Not Applicable  Comments: Patient completed exercise prescription and all exercise goals during rehab session. The exercise was tolerated well and the patient is progressing in the program.     Dr. Mark Miller is Medical Director for [facility] and [facility]. 

## 2015-09-11 NOTE — Progress Notes (Signed)
Discharge Summary  Patient Details  Name: Kyle [name] MRN: [id number] Date of Birth: [birth year] Referring Provider:     Number of Visits:   Reason for Discharge:  Patient reached a stable level of exercise. Patient independent in their exercise.  Smoking History:  History  Smoking status  . Never Smoker   Smokeless tobacco  . Former Systems developer  . Types: Chew  . Quit date: 04/22/2004    Diagnosis:  NSTEMI (non-ST elevated myocardial infarction) ([place])  S/P PTCA (percutaneous transluminal coronary angioplasty)  ADL UCSD:   Initial Exercise Prescription:     Initial Exercise Prescription - 07/20/15 1400    Date of Initial Exercise RX and Referring Provider   Date 07/20/15   Treadmill   MPH 1.2   Grade 0   Minutes 10   Recumbant Bike   Level 2   RPM 40   Watts 20   Minutes 10   NuStep   Level 2   Watts 40   Minutes 10   Arm Ergometer   Level 1   Watts 10   Minutes 10   REL-XR   Level 2   Watts 40   Minutes 10   T5 Nustep   Level 1   Watts 15   Minutes 10   Biostep-RELP   Level 2   Watts 40   Minutes 10   Prescription Details   Frequency (times per week) 3   Duration Progress to 30 minutes of continuous aerobic without signs/symptoms of physical distress   Intensity   THRR REST +  30   Ratings of Perceived Exertion 11-15   Perceived Dyspnea 0-4   Progression   Progression Continue progressive overload as per policy without signs/symptoms or physical distress.   Resistance Training   Training Prescription Yes   Weight 1   Reps 10-15      Discharge Exercise Prescription (Final Exercise Prescription Changes):     Exercise Prescription Changes - 08/30/15 0600    Exercise Review   Progression Yes   Response to Exercise   Blood Pressure (Admit) 138/62 mmHg   Blood Pressure (Exercise) 162/80 mmHg   Blood Pressure (Exit) 122/74 mmHg   Heart Rate (Admit) 65 bpm   Heart Rate (Exercise) 75 bpm   Heart Rate (Exit) 66 bpm   Rating of Perceived  Exertion (Exercise) 11   Symptoms none   Duration Progress to 45 minutes of aerobic exercise without signs/symptoms of physical distress   Intensity Rest + 30   Progression   Progression Continue progressive overload as per policy without signs/symptoms or physical distress.   Resistance Training   Training Prescription Yes   Weight 3   Reps 10-15   Interval Training   Interval Training Yes   Equipment T5 Nustep;Biostep-RELP   Comments Use BS or T5 for interval training. RPE should range between 11-15 and intervals should be 2 min by 30 sec in duration. See specific workloads below.    Treadmill   MPH 2   Grade 0.5   Minutes 20   T5 Nustep   Level 2   Watts 30  intervals between 20-30 watts   Minutes 20   Biostep-RELP   Level 3   Watts 40  intervals between 30-40 watts.    Minutes 20   Home Exercise Plan   Frequency Add 1 additional day to program exercise sessions.      Functional Capacity:     6 Minute Walk  07/20/15 1424 09/11/15 1414     6 Minute Walk   Phase Initial Discharge    Distance 860 feet 875 feet    Walk Time 6 minutes 6 minutes    # of Rest Breaks  0    RPE 15 10    Symptoms Yes (comment)  [place] c/o being light headed dizzy at the end of his 6 min walk -his blood pressure was 120/60 and no new ectopy. No    Resting HR 62 bpm 65 bpm    Resting BP 138/60 mmHg 116/78 mmHg    Max Ex. HR 92 bpm 81 bpm    Max Ex. BP 120/60 mmHg 120/70 mmHg       Psychological, QOL, Others - Outcomes: PHQ 2/9: Depression screen [facility] 2/9 09/01/2015 07/20/2015 05/05/2015 04/20/2014 04/16/2013  Decreased Interest 2 3 0 0 0  Down, Depressed, Hopeless 2 3 1  0 0  PHQ - 2 Score 4 6 1  0 0  Altered sleeping 2 2 - - -  Tired, decreased energy 2 3 - - -  Change in appetite 0 0 - - -  Feeling bad or failure about yourself  1 3 - - -  Trouble concentrating 2 2 - - -  Moving slowly or fidgety/restless 0 3 - - -  Suicidal thoughts 0 0 - - -  PHQ-9 Score 11 19 - - -   Difficult doing work/chores Not difficult at all Somewhat difficult - - -    Quality of Life:     Quality of Life - 09/01/15 1237    Quality of Life Scores   Health/Function Post 22.37 %   Socioeconomic Post 26.44 %   Psych/Spiritual Post 23.36 %   Family Post 21.5 %   GLOBAL Post 23.37 %      Personal Goals: Goals established at orientation with interventions provided to work toward goal.     Personal Goals and Risk Factors at Admission - 07/20/15 1428    Core Components/Risk Factors/Patient Goals on Admission   Sedentary Yes   Intervention Provide advice, education, support and counseling about physical activity/exercise needs.;Develop an individualized exercise prescription for aerobic and resistive training based on initial evaluation findings, risk stratification, comorbidities and participant's personal goals.   Expected Outcomes Achievement of increased cardiorespiratory fitness and enhanced flexibility, muscular endurance and strength shown through measurements of functional capacity and personal statement of participant.       Personal Goals Discharge:     Goals and Risk Factor Review      08/02/15 0935 08/02/15 0940 08/04/15 0847 08/23/15 0935 08/23/15 0941   Core Components/Risk Factors/Patient Goals Review   Personal Goals Review Weight Management/Obesity;Hypertension  Lipids Sedentary Hypertension   Review Besnik states he is trying to watch his weight, and is taking blood pressure medication as prescribed.  He has a blood pressure monitor at home and states he knows how to use it.  He states he has taken the group nutrition education class and does not wish to meet with the dietitian individually at this time.  Tracer is compliant with his medications and will continue to follow the nutrition guidelines to help with cholesterol control Stark stated one of his goals in coming to cardiac rehab was to increase his heart rate and to increase energy/stamina.  Delio is  increasing his heart rate while exercising.  Thad also explained that he has more overall energy, but he is not where he wants to be.  Pinchos plans to continue exercising  and coming to Cardiac Rehab.   Zayvier's BP has been around 130/70 upon check in and [id number] systolic and 0000000 to Q000111Q diastolic.    Expected Outcomes Renay will maintain weight and vital signs within acceptable parameters.  Individual session with dietitian will be made available if Teshawn chooses to pursue this option. Steveson will maintain weight and vital signs within acceptable parameters.  Individual session with dietitian will be made available if Vinton chooses to pursue this option. maintanence of lifestyle changes for cholesterol control Georffrey plans to continue exercising to increase his heart rate and to increase his energy.   Brandom's BP is controlled on current medication regimen.       09/06/15 1510           Core Components/Risk Factors/Patient Goals Review   Review I called Dr. Alla German office after I faxed heart rhythm strip from today with multiple PVC's. Rachad has no c/o with his PVC's. Dr. Ron Agee office was going to call Dr. Vickki Muff office since they said did not refer Decarlos to Cardiac Rehab but Dr. Vickki Muff office did.           Nutrition & Weight - Outcomes:     Pre Biometrics - 07/20/15 1426    Pre Biometrics   Height 5' 10.9" (1.801 m)   Weight 196 lb 12.8 oz (89.268 kg)   Waist Circumference 41 inches   Hip Circumference 40.5 inches   Waist to Hip Ratio 1.01 %   BMI (Calculated) 27.6         Post Biometrics - 09/11/15 1422     Post  Biometrics   Height 5' 10.9" (1.801 m)   Weight 195 lb (88.451 kg)   Waist Circumference 40 inches   Hip Circumference 40.5 inches   Waist to Hip Ratio 0.99 %   BMI (Calculated) 27.3      Nutrition:     Nutrition Therapy & Goals - 07/20/15 1257    Nutrition Therapy   Drug/Food Interactions Statins/Certain Fruits   Intervention Plan   Intervention  Prescribe, educate and counsel regarding individualized specific dietary modifications aiming towards targeted core components such as weight, hypertension, lipid management, diabetes, heart failure and other comorbidities.   Expected Outcomes Short Term Goal: Understand basic principles of dietary content, such as calories, fat, sodium, cholesterol and nutrients.;Long Term Goal: Adherence to prescribed nutrition plan.      Nutrition Discharge:     Nutrition Assessments - 09/01/15 0946    Rate Your Plate Scores   Post Score 74   Post Score % 68.06 %      Education Questionnaire Score:     Knowledge Questionnaire Score - 09/01/15 1238    Knowledge Questionnaire Score   Post Score 18  24 was in error. Score was 18.      Goals reviewed with patient; copy given to patient.

## 2015-09-11 NOTE — Progress Notes (Signed)
Cardiac Individual Treatment Plan  Patient Details  Name: HALO LASKI MRN: [id number] Date of Birth: 1931-09-08 Referring Provider:    Initial Encounter Date:       Cardiac Rehab from 07/20/2015 in [facility] Cardiac and Pulmonary Rehab   Date  07/20/15      Visit Diagnosis: NSTEMI (non-ST elevated myocardial infarction) ([place])  S/P PTCA (percutaneous transluminal coronary angioplasty)  Patient's Home Medications on Admission:  Current outpatient prescriptions:  .  apixaban (ELIQUIS) 2.5 MG TABS tablet, Take 1 tablet (2.5 mg total) by mouth 2 (two) times daily., Disp: 60 tablet, Rfl: 5 .  atorvastatin (LIPITOR) 80 MG tablet, Take 1 tablet (80 mg total) by mouth daily at 6 PM., Disp: 30 tablet, Rfl: 11 .  clopidogrel (PLAVIX) 75 MG tablet, TAKE 1 TABLET BY MOUTH DAILY, Disp: 30 tablet, Rfl: 11 .  furosemide (LASIX) 20 MG tablet, Take 1 tablet (20 mg total) by mouth daily., Disp: 30 tablet, Rfl: 11 .  losartan (COZAAR) 50 MG tablet, Take 1 tablet (50 mg total) by mouth daily., Disp: 90 tablet, Rfl: 3 .  metoprolol tartrate (LOPRESSOR) 25 MG tablet, Take 1 tablet (25 mg total) by mouth 2 (two) times daily., Disp: 60 tablet, Rfl: 11 .  traMADol (ULTRAM) 50 MG tablet, TAKE ONE TABLET 3 TIMES DAILY AS NEEDED, Disp: 90 tablet, Rfl: 0  Past Medical History: Past Medical History  Diagnosis Date  . History of prostate cancer   . Hyperlipidemia   . Hypertension   . Arthritis   . Impaired fasting glucose   . Detached retina     right  . H/O dizziness   . Urgency of urination   . Cancer [facility])     prostate CA  . GERD (gastroesophageal reflux disease)   . Stroke [facility]) ~ 1995-02/2009 X 2    /notes 08/22/2010  . Claustrophobia     "EXTREMELY" (03/02/2015)    Tobacco Use: History  Smoking status  . Never Smoker   Smokeless tobacco  . Former Systems developer  . Types: Chew  . Quit date: 04/22/2004    Labs: Recent Review Flowsheet Data    Labs for ITP Cardiac and Pulmonary Rehab Latest Ref Rng  10/10/2010 04/16/2013 04/20/2014 05/10/2014 03/01/2015   Cholestrol 0 - 200 mg/dL 237(H) 194 222(H) - 194   LDLCALC 0 - 99 mg/dL - 128(H) 145(H) - 128(H)   LDLDIRECT - 164.4 - - - -   HDL >40 mg/dL 52.90 44.40 49.50 - 53   Trlycerides <150 mg/dL 182.0(H) 110.0 138.0 - 63   Hemoglobin A1c <5.7 % 6.7(H) - - 6.1(H) -       Exercise Target Goals:    Exercise Program Goal: Individual exercise prescription set with THRR, safety & activity barriers. Participant demonstrates ability to understand and report RPE using BORG scale, to self-measure pulse accurately, and to acknowledge the importance of the exercise prescription.  Exercise Prescription Goal: Starting with aerobic activity 30 plus minutes a day, 3 days per week for initial exercise prescription. Provide home exercise prescription and guidelines that participant acknowledges understanding prior to discharge.  Activity Barriers & Risk Stratification:     Activity Barriers & Cardiac Risk Stratification - 07/20/15 1253    Activity Barriers & Cardiac Risk Stratification   Activity Barriers Arthritis;Joint Problems;Shortness of Breath;Assistive Device   Cardiac Risk Stratification High      6 Minute Walk:     6 Minute Walk      07/20/15 1424 09/11/15 1414  6 Minute Walk   Phase Initial Discharge    Distance 860 feet 875 feet    Walk Time 6 minutes 6 minutes    # of Rest Breaks  0    RPE 15 10    Symptoms Yes (comment)  Zayvien c/o being light headed dizzy at the end of his 6 min walk -his blood pressure was 120/60 and no new ectopy. No    Resting HR 62 bpm 65 bpm    Resting BP 138/60 mmHg 116/78 mmHg    Max Ex. HR 92 bpm 81 bpm    Max Ex. BP 120/60 mmHg 120/70 mmHg       Initial Exercise Prescription:     Initial Exercise Prescription - 07/20/15 1400    Date of Initial Exercise RX and Referring Provider   Date 07/20/15   Treadmill   MPH 1.2   Grade 0   Minutes 10   Recumbant Bike   Level 2   RPM 40   Watts  20   Minutes 10   NuStep   Level 2   Watts 40   Minutes 10   Arm Ergometer   Level 1   Watts 10   Minutes 10   REL-XR   Level 2   Watts 40   Minutes 10   T5 Nustep   Level 1   Watts 15   Minutes 10   Biostep-RELP   Level 2   Watts 40   Minutes 10   Prescription Details   Frequency (times per week) 3   Duration Progress to 30 minutes of continuous aerobic without signs/symptoms of physical distress   Intensity   THRR REST +  30   Ratings of Perceived Exertion 11-15   Perceived Dyspnea 0-4   Progression   Progression Continue progressive overload as per policy without signs/symptoms or physical distress.   Resistance Training   Training Prescription Yes   Weight 1   Reps 10-15      Perform Capillary Blood Glucose checks as needed.  Exercise Prescription Changes:     Exercise Prescription Changes      07/31/15 0800 08/30/15 0600         Exercise Review   Progression Yes Yes      Response to Exercise   Blood Pressure (Admit) 128/72 mmHg 138/62 mmHg      Blood Pressure (Exercise) 138/72 mmHg 162/80 mmHg      Blood Pressure (Exit) 130/70 mmHg 122/74 mmHg      Heart Rate (Admit) 59 bpm 65 bpm      Heart Rate (Exercise) 77 bpm 75 bpm      Heart Rate (Exit) 61 bpm 66 bpm      Rating of Perceived Exertion (Exercise) 11 11      Symptoms none none      Duration Progress to 30 minutes of continuous aerobic without signs/symptoms of physical distress Progress to 45 minutes of aerobic exercise without signs/symptoms of physical distress      Intensity Rest + 30 Rest + 30      Progression   Progression Continue progressive overload as per policy without signs/symptoms or physical distress. Continue progressive overload as per policy without signs/symptoms or physical distress.      Resistance Training   Training Prescription Yes Yes      Weight 3 3      Reps 10-15 10-15      Interval Training   Interval Training  Yes  Equipment  T5 Nustep;Biostep-RELP       Comments  Use BS or T5 for interval training. RPE should range between 11-15 and intervals should be 2 min by 30 sec in duration. See specific workloads below.       Treadmill   MPH 1.6 2      Grade 0 0.5      Minutes 15 20      Recumbant Bike   Level --       RPM --       Watts --       Minutes --       NuStep   Level 2       Watts 50       Minutes 15       Arm Ergometer   Level --       Watts --       Minutes --       REL-XR   Level --       Watts --       Minutes --       T5 Nustep   Level -- 2      Watts -- 30  intervals between 20-30 watts      Minutes -- 20      Biostep-RELP   Level -- 3      Watts -- 40  intervals between 30-40 watts.       Minutes -- 20      Home Exercise Plan   Frequency  Add 1 additional day to program exercise sessions.         Exercise Comments:     Exercise Comments      07/31/15 0823 07/31/15 0828 07/31/15 1401 08/09/15 1308 08/28/15 1159   Exercise Comments Lemoine's weight has been increased to 3 lbs for the strength training portion of the exercise class. This increase was discussed with the patient and and he tolerated it with no symptoms.  From his initial exercise prescription Jaece has been consistantly using the TM and NS machines, and has increased his total exercise time to up to 35 min in duration. He is able to tolerate this duration without rest breaks and has been asymptomatic.  Brennin stated that he was been a little sore since starting his exercise program, but not to the point of inhibiting his daily activities.  His energy level is about the same but is hoping to gain energy from participating in a structured exercise program.  I faxed all Cardiac Rehab rhythm strips to Dr. Levon Hedger offiice today. No c/o. No s/s but junctional rhythm and muliptiple PVC's couplets. No s/s.  Camille stated that he is continuing to feel stronger and have more energy since being consistant with his exercise routine. There were no specific  activities that he is wanting to achieve in his daily life besides continuing to feel better. Expected outcome: continue to progress with workload intensities and duration in cardiac  rehab class.      08/30/15 4818 09/01/15 0917 09/11/15 1424       Exercise Comments Reviewed individualized exercise prescription and made increases per departmental policy. Exercise increases were discussed with the patient and they were able to perform the new work loads without issue (no signs or symptoms).  Discussed interval training recommendations and benefits with the patient.  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 Discussed options to exercise at [facility] in [facility] program.  6 min walk assessment performed today - see data sheet  for detailed report.  After graduation Deidre Ala plans to exercise at the [facility] fitness center with his [employer].        Discharge Exercise Prescription (Final Exercise Prescription Changes):     Exercise Prescription Changes - 08/30/15 0600    Exercise Review   Progression Yes   Response to Exercise   Blood Pressure (Admit) 138/62 mmHg   Blood Pressure (Exercise) 162/80 mmHg   Blood Pressure (Exit) 122/74 mmHg   Heart Rate (Admit) 65 bpm   Heart Rate (Exercise) 75 bpm   Heart Rate (Exit) 66 bpm   Rating of Perceived Exertion (Exercise) 11   Symptoms none   Duration Progress to 45 minutes of aerobic exercise without signs/symptoms of physical distress   Intensity Rest + 30   Progression   Progression Continue progressive overload as per policy without signs/symptoms or physical distress.   Resistance Training   Training Prescription Yes   Weight 3   Reps 10-15   Interval Training   Interval Training Yes   Equipment T5 Nustep;Biostep-RELP   Comments Use BS or T5 for interval training. RPE should range between 11-15 and intervals should be 2 min by 30 sec in duration. See specific workloads below.    Treadmill   MPH 2   Grade 0.5   Minutes 20   T5 Nustep   Level  2   Watts 30  intervals between 20-30 watts   Minutes 20   Biostep-RELP   Level 3   Watts 40  intervals between 30-40 watts.    Minutes 20   Home Exercise Plan   Frequency Add 1 additional day to program exercise sessions.      Nutrition:  Target Goals: Understanding of nutrition guidelines, daily intake of sodium '1500mg'$ , cholesterol '200mg'$ , calories 30% from fat and 7% or less from saturated fats, daily to have 5 or more servings of fruits and vegetables.  Biometrics:     Pre Biometrics - 07/20/15 1426    Pre Biometrics   Height 5' 10.9" (1.801 m)   Weight 196 lb 12.8 oz (89.268 kg)   Waist Circumference 41 inches   Hip Circumference 40.5 inches   Waist to Hip Ratio 1.01 %   BMI (Calculated) 27.6         Post Biometrics - 09/11/15 1422     Post  Biometrics   Height 5' 10.9" (1.801 m)   Weight 195 lb (88.451 kg)   Waist Circumference 40 inches   Hip Circumference 40.5 inches   Waist to Hip Ratio 0.99 %   BMI (Calculated) 27.3      Nutrition Therapy Plan and Nutrition Goals:     Nutrition Therapy & Goals - 07/20/15 1257    Nutrition Therapy   Drug/Food Interactions Statins/Certain Fruits   Intervention Plan   Intervention Prescribe, educate and counsel regarding individualized specific dietary modifications aiming towards targeted core components such as weight, hypertension, lipid management, diabetes, heart failure and other comorbidities.   Expected Outcomes Short Term Goal: Understand basic principles of dietary content, such as calories, fat, sodium, cholesterol and nutrients.;Long Term Goal: Adherence to prescribed nutrition plan.      Nutrition Discharge: Rate Your Plate Scores:     Nutrition Assessments - 09/01/15 0946    Rate Your Plate Scores   Post Score 74   Post Score % 68.06 %      Nutrition Goals Re-Evaluation:   Psychosocial: Target Goals: Acknowledge presence or absence of depression, maximize coping skills, provide  positive  support system. Participant is able to verbalize types and ability to use techniques and skills needed for reducing stress and depression.  Initial Review & Psychosocial Screening:     Initial Psych Review & Screening - 07/20/15 [place]? Yes   Barriers   Psychosocial barriers to participate in program There are no identifiable barriers or psychosocial needs.      Quality of Life Scores:     Quality of Life - 09/01/15 1237    Quality of Life Scores   Health/Function Post 22.37 %   Socioeconomic Post 26.44 %   Psych/Spiritual Post 23.36 %   Family Post 21.5 %   GLOBAL Post 23.37 %      PHQ-9:     Recent Review Flowsheet Data    Depression screen [facility] 2/9 09/01/2015 07/20/2015 05/05/2015 04/20/2014 04/16/2013   Decreased Interest 2 3 0 0 0   Down, Depressed, Hopeless _0 0 0   PHQ - 2 Score _1 0 0   Altered sleeping 2 2 - - -   Tired, decreased energy 2 3 - - -   Change in appetite 0 0 - - -   Feeling bad or failure about yourself  1 3 - - -   Trouble concentrating 2 2 - - -   Moving slowly or fidgety/restless 0 3 - - -   Suicidal thoughts 0 0 - - -   PHQ-9 Score 11 19 - - -   Difficult doing work/chores Not difficult at all Somewhat difficult - - -      Psychosocial Evaluation and Intervention:     Psychosocial Evaluation - 07/26/15 0930    Psychosocial Evaluation & Interventions   Interventions Encouraged to exercise with the program and follow exercise prescription;Relaxation education;Stress management education   Comments Counselor met with Mr. Hur today for initial psychosocial evaluation.  He is a retired 82 year old who had a heart attack in November of 2016.  He has a strong support system with a spouse of 66 years, a son and sister who live close by and he is an active participant in his local church community.  Mr. Caspers has arthritis and several other health issues and reports he has not slept well for quite some  time, other than the past 3 nights.  He states his appetite is good, and he denies a history of depression or anxiety.  However, Mr. Hollibaugh participated in the Depression education done by this counselor today, and as a result admits to some current symptoms and his PHQ-9 was 19, which is moderately severe for depression.  Counselor assessed Mr. Ormond current mood and he stated it has improved lately with walking more and sleeping better the past 3 nights.  He admits to stress being not able to do the things he likes to do, such as working out in the yard and mowing.  Counselor encouraged Mr. Gwynne to speak with his doctor about his depressive symptoms if they do not continue to improve in the near future with exercise.  He agreed to do so.  This counselor will follow with Mr. Dolinger on this and make recommendations accordingly.    Continued Psychosocial Services Needed --  Mr. gaut is encouraged to speak with his Dr. about his current depressive symptoms.  If these do not improve significantly in the near future with consistent exercise, counselor will make a referral to a therapist  as well.        Psychosocial Re-Evaluation:     Psychosocial Re-Evaluation      08/16/15 1022 09/11/15 0943         Psychosocial Re-Evaluation   Comments Follow up with Mr. Virani reporting an improvement in his mood and his sleep since he began this program.  He also states he is noticing an increase in his stamina as well.  Counselor commended him on this progress and will continue to follow as needed.  Follow up with Mr. Hollyfield reporting increased energy; walking further; & sleeping better since beginning this program.  He is scheduled to "graduate" next week and has also reporting weight loss and decreased anxiety since working out more consistently.  Mr. Pyle reports his mood has improved as well and his PHQ-9 dropped from a 19 to 60 which is still a little elevated; but he considers this "not difficult at all."  Mr.  Debski reports he plans to continue exercising along with his wife at a local [place] program.  Counselor commended Mr. Gallardo for all of his hard work and progress made.             Vocational Rehabilitation: Provide vocational rehab assistance to qualifying candidates.   Vocational Rehab Evaluation & Intervention:     Vocational Rehab - 07/20/15 1253    Initial Vocational Rehab Evaluation & Intervention   Assessment shows need for Vocational Rehabilitation No      Education: Education Goals: Education classes will be provided on a weekly basis, covering required topics. Participant will state understanding/return demonstration of topics presented.  Learning Barriers/Preferences:     Learning Barriers/Preferences - 07/20/15 1253    Learning Barriers/Preferences   Learning Barriers Sight   Learning Preferences Individual Instruction      Education Topics: General Nutrition Guidelines/Fats and Fiber: -Group instruction provided by verbal, written material, models and posters to present the general guidelines for heart healthy nutrition. Gives an explanation and review of dietary fats and fiber.   Controlling Sodium/Reading Food Labels: -Group verbal and written material supporting the discussion of sodium use in heart healthy nutrition. Review and explanation with models, verbal and written materials for utilization of the food label.          Cardiac Rehab from 09/11/2015 in [facility] Cardiac and Pulmonary Rehab   Date  07/31/15   Educator  CR   Instruction Review Code  2- meets goals/outcomes      Exercise Physiology & Risk Factors: - Group verbal and written instruction with models to review the exercise physiology of the cardiovascular system and associated critical values. Details cardiovascular disease risk factors and the goals associated with each risk factor.      Cardiac Rehab from 09/11/2015 in [facility] Cardiac and Pulmonary Rehab   Date  08/09/15   Educator  BS    Instruction Review Code  2- meets goals/outcomes      Aerobic Exercise & Resistance Training: - Gives group verbal and written discussion on the health impact of inactivity. On the components of aerobic and resistive training programs and the benefits of this training and how to safely progress through these programs.      Cardiac Rehab from 09/11/2015 in [facility] Cardiac and Pulmonary Rehab   Date  08/14/15   Educator  RS   Instruction Review Code  2- meets goals/outcomes      Flexibility, Balance, General Exercise Guidelines: - Provides group verbal and written instruction on the benefits of flexibility  and balance training programs. Provides general exercise guidelines with specific guidelines to those with heart or lung disease. Demonstration and skill practice provided.      Cardiac Rehab from 09/11/2015 in [facility] Cardiac and Pulmonary Rehab   Date  08/16/15   Educator  BS   Instruction Review Code  2- meets goals/outcomes      Stress Management: - Provides group verbal and written instruction about the health risks of elevated stress, cause of high stress, and healthy ways to reduce stress.      Cardiac Rehab from 09/11/2015 in [facility] Cardiac and Pulmonary Rehab   Date  08/23/15   Educator  Kathreen Cornfield, [facility]   Instruction Review Code  2- meets goals/outcomes      Depression: - Provides group verbal and written instruction on the correlation between heart/lung disease and depressed mood, treatment options, and the stigmas associated with seeking treatment.      Cardiac Rehab from 07/26/2015 in [facility] Cardiac and Pulmonary Rehab   Date  07/26/15   Educator  [facility]   Instruction Review Code  2- meets goals/outcomes      Anatomy & Physiology of the Heart: - Group verbal and written instruction and models provide basic cardiac anatomy and physiology, with the coronary electrical and arterial systems. Review of: AMI, Angina, Valve disease, Heart Failure, Cardiac Arrhythmia, Pacemakers, and the  ICD.      Cardiac Rehab from 09/11/2015 in [facility] Cardiac and Pulmonary Rehab   Date  08/21/15   Educator  SB   Instruction Review Code  2- meets goals/outcomes      Cardiac Procedures: - Group verbal and written instruction and models to describe the testing methods done to diagnose heart disease. Reviews the outcomes of the test results. Describes the treatment choices: Medical Management, Angioplasty, or Coronary Bypass Surgery.      Cardiac Rehab from 09/11/2015 in [facility] Cardiac and Pulmonary Rehab   Date  08/28/15   Educator  CE   Instruction Review Code  2- meets goals/outcomes      Cardiac Medications: - Group verbal and written instruction to review commonly prescribed medications for heart disease. Reviews the medication, class of the drug, and side effects. Includes the steps to properly store meds and maintain the prescription regimen.      Cardiac Rehab from 09/11/2015 in [facility] Cardiac and Pulmonary Rehab   Date  09/06/15   Educator  SB   Instruction Review Code  2- meets goals/outcomes [Part II:  Cardiac Medications]      Go Sex-Intimacy & Heart Disease, Get SMART - Goal Setting: - Group verbal and written instruction through game format to discuss heart disease and the return to sexual intimacy. Provides group verbal and written material to discuss and apply goal setting through the application of the S.M.A.R.T. Method.      Cardiac Rehab from 09/11/2015 in [facility] Cardiac and Pulmonary Rehab   Date  08/28/15   Educator  CE   Instruction Review Code  2- meets goals/outcomes      Other Matters of the Heart: - Provides group verbal, written materials and models to describe Heart Failure, Angina, Valve Disease, and Diabetes in the realm of heart disease. Includes description of the disease process and treatment options available to the cardiac patient.      Cardiac Rehab from 09/11/2015 in [facility] Cardiac and Pulmonary Rehab   Date  08/07/15   Educator  SB   Instruction Review  Code  2- meets goals/outcomes  Exercise & Equipment Safety: - Individual verbal instruction and demonstration of equipment use and safety with use of the equipment.      Cardiac Rehab from 09/11/2015 in [facility] Cardiac and Pulmonary Rehab   Date  07/28/15   Educator  C. EnterkinRN   Instruction Review Code  1- partially meets, needs review/practice [place] needs extra help with the Treadmill,[employer] speed too m]      Infection Prevention: - Provides verbal and written material to individual with discussion of infection control including proper hand washing and proper equipment cleaning during exercise session.      Cardiac Rehab from 07/26/2015 in [facility] Cardiac and Pulmonary Rehab   Date  07/20/15   Educator  C. EnterkinRN   Instruction Review Code  2- meets goals/outcomes      Falls Prevention: - Provides verbal and written material to individual with discussion of falls prevention and safety.      Cardiac Rehab from 07/26/2015 in [facility] Cardiac and Pulmonary Rehab   Date  07/20/15   Educator  C. Luling   Instruction Review Code  1- partially meets, needs review/practice      Diabetes: - Individual verbal and written instruction to review signs/symptoms of diabetes, desired ranges of glucose level fasting, after meals and with exercise. Advice that pre and post exercise glucose checks will be done for 3 sessions at entry of program.    Knowledge Questionnaire Score:     Knowledge Questionnaire Score - 09/01/15 1238    Knowledge Questionnaire Score   Post Score 18  24 was in error. Score was 18.      Core Components/Risk Factors/Patient Goals at Admission:     Personal Goals and Risk Factors at Admission - 07/20/15 1428    Core Components/Risk Factors/Patient Goals on Admission   Sedentary Yes   Intervention Provide advice, education, support and counseling about physical activity/exercise needs.;Develop an individualized exercise prescription for aerobic and resistive  training based on initial evaluation findings, risk stratification, comorbidities and participant's personal goals.   Expected Outcomes Achievement of increased cardiorespiratory fitness and enhanced flexibility, muscular endurance and strength shown through measurements of functional capacity and personal statement of participant.      Core Components/Risk Factors/Patient Goals Review:      Goals and Risk Factor Review      08/02/15 0935 08/02/15 0940 08/04/15 0847 08/23/15 0935 08/23/15 0941   Core Components/Risk Factors/Patient Goals Review   Personal Goals Review Weight Management/Obesity;Hypertension  Lipids Sedentary Hypertension   Review Luiz states he is trying to watch his weight, and is taking blood pressure medication as prescribed.  He has a blood pressure monitor at home and states he knows how to use it.  He states he has taken the group nutrition education class and does not wish to meet with the dietitian individually at this time.  Chaney is compliant with his medications and will continue to follow the nutrition guidelines to help with cholesterol control Jad stated one of his goals in coming to cardiac rehab was to increase his heart rate and to increase energy/stamina.  Osvaldo is increasing his heart rate while exercising.  Eleftherios also explained that he has more overall energy, but he is not where he wants to be.  Anastacio plans to continue exercising  and coming to Cardiac Rehab.   Tallan's BP has been around 130/70 upon check in and 505L/976B systolic and 34L to 93X diastolic.    Expected Outcomes Willies will maintain weight and vital signs  within acceptable parameters.  Individual session with dietitian will be made available if Melesio chooses to pursue this option. Ryker will maintain weight and vital signs within acceptable parameters.  Individual session with dietitian will be made available if Meilech chooses to pursue this option. maintanence of lifestyle changes for cholesterol  control Kory plans to continue exercising to increase his heart rate and to increase his energy.   Janthony's BP is controlled on current medication regimen.       09/06/15 1510           Core Components/Risk Factors/Patient Goals Review   Review I called Dr. Alla German office after I faxed heart rhythm strip from today with multiple PVC's. Reggie has no c/o with his PVC's. Dr. Ron Agee office was going to call Dr. Vickki Muff office since they said did not refer Farmer to Cardiac Rehab but Dr. Vickki Muff office did.           Core Components/Risk Factors/Patient Goals at Discharge (Final Review):      Goals and Risk Factor Review - 09/06/15 1510    Core Components/Risk Factors/Patient Goals Review   Review I called Dr. Alla German office after I faxed heart rhythm strip from today with multiple PVC's. Blayden has no c/o with his PVC's. Dr. Ron Agee office was going to call Dr. Vickki Muff office since they said did not refer Yahsir to Cardiac Rehab but Dr. Vickki Muff office did.       ITP Comments:     ITP Comments      07/20/15 1430 08/02/15 3154 08/02/15 0940 08/09/15 1306 08/13/15 0940   ITP Comments  Clinton uses his walker at times so I encouraged him and got him to use his walker with his 6 minute walk in the hall.  Saurav states he is trying to watch his weight, and is taking blood pressure medication as prescribed.  He has a blood pressure monitor at home and states he knows how to use it.  He states he has taken the group nutrition education class and does not wish to meet with the dietitian individually at this time Juliocesar will maintain weight and vital signs within acceptable parameters.  Individual session with dietitian will be made available if Amahri chooses to pursue this option. I faxed all Cardiac Rehab rhythm strips to Dr. Levon Hedger offiice today. No c/o. No s/s but junctional rhythm and muliptiple PVC's couplets. No s/s.  30 day review.  Continue with ITP  New to program      08/28/15 0826 09/06/15 1505 09/10/15 1150 09/11/15 1102     ITP Comments Temporary medication change: on antibiotic for UTI I called Dr. Alla German office after I faxed heart rhythm strip from today with multiple PVC's. Seyon has no c/o with his PVC's. Dr. Ron Agee office was going to call Dr. Vickki Muff office since they said did not refer Bryden to Cardiac Rehab but Dr. Vickki Muff office did.  30 day review. Continue with ITP Villa Herb switched to decaf coffee this am (instead of his normal 2 cups of coffee with caffeine). Raife's heart rhythm strips were so much better this am. Homer will cont to drink decaff he said.        Comments:

## 2015-09-11 NOTE — Telephone Encounter (Signed)
Follow-up     The  Nurse at cardiac rehab is calling back to notify staff about the fax on Friday the pt has stopped drinking coffee and now the hr strip is normal

## 2015-09-11 NOTE — Patient Instructions (Signed)
Discharge Instructions  Patient Details  Name: Kyle [name] MRN: [id number] Date of Birth: 14-Dec-1931 Referring Provider:  Pixie Casino, MD   Number of Visits:   Reason for Discharge:  Patient reached a stable level of exercise. Patient independent in their exercise.  Smoking History:  History  Smoking status  . Never Smoker   Smokeless tobacco  . Former Systems developer  . Types: Chew  . Quit date: 04/22/2004    Diagnosis:  NSTEMI (non-ST elevated myocardial infarction) (HCC)  S/P PTCA (percutaneous transluminal coronary angioplasty)  Initial Exercise Prescription:     Initial Exercise Prescription - 07/20/15 1400    Date of Initial Exercise RX and Referring Provider   Date 07/20/15   Treadmill   MPH 1.2   Grade 0   Minutes 10   Recumbant Bike   Level 2   RPM 40   Watts 20   Minutes 10   NuStep   Level 2   Watts 40   Minutes 10   Arm Ergometer   Level 1   Watts 10   Minutes 10   REL-XR   Level 2   Watts 40   Minutes 10   T5 Nustep   Level 1   Watts 15   Minutes 10   Biostep-RELP   Level 2   Watts 40   Minutes 10   Prescription Details   Frequency (times per week) 3   Duration Progress to 30 minutes of continuous aerobic without signs/symptoms of physical distress   Intensity   THRR REST +  30   Ratings of Perceived Exertion 11-15   Perceived Dyspnea 0-4   Progression   Progression Continue progressive overload as per policy without signs/symptoms or physical distress.   Resistance Training   Training Prescription Yes   Weight 1   Reps 10-15      Discharge Exercise Prescription (Final Exercise Prescription Changes):     Exercise Prescription Changes - 08/30/15 0600    Exercise Review   Progression Yes   Response to Exercise   Blood Pressure (Admit) 138/62 mmHg   Blood Pressure (Exercise) 162/80 mmHg   Blood Pressure (Exit) 122/74 mmHg   Heart Rate (Admit) 65 bpm   Heart Rate (Exercise) 75 bpm   Heart Rate (Exit) 66 bpm   Rating of  Perceived Exertion (Exercise) 11   Symptoms none   Duration Progress to 45 minutes of aerobic exercise without signs/symptoms of physical distress   Intensity Rest + 30   Progression   Progression Continue progressive overload as per policy without signs/symptoms or physical distress.   Resistance Training   Training Prescription Yes   Weight 3   Reps 10-15   Interval Training   Interval Training Yes   Equipment T5 Nustep;Biostep-RELP   Comments Use BS or T5 for interval training. RPE should range between 11-15 and intervals should be 2 min by 30 sec in duration. See specific workloads below.    Treadmill   MPH 2   Grade 0.5   Minutes 20   T5 Nustep   Level 2   Watts 30  intervals between 20-30 watts   Minutes 20   Biostep-RELP   Level 3   Watts 40  intervals between 30-40 watts.    Minutes 20   Home Exercise Plan   Frequency Add 1 additional day to program exercise sessions.      Functional Capacity:     6 Minute Walk      07/20/15  1424 09/11/15 1414     6 Minute Walk   Phase Initial Discharge    Distance 860 feet 875 feet    Walk Time 6 minutes 6 minutes    # of Rest Breaks  0    RPE 15 10    Symptoms Yes (comment)  Kyle [name] c/o being light headed dizzy at the end of his 6 min walk -his blood pressure was 120/60 and no new ectopy. No    Resting HR 62 bpm 65 bpm    Resting BP 138/60 mmHg 116/78 mmHg    Max Ex. HR 92 bpm 81 bpm    Max Ex. BP 120/60 mmHg 120/70 mmHg       Quality of Life:     Quality of Life - 09/01/15 1237    Quality of Life Scores   Health/Function Post 22.37 %   Socioeconomic Post 26.44 %   Psych/Spiritual Post 23.36 %   Family Post 21.5 %   GLOBAL Post 23.37 %      Personal Goals: Goals established at orientation with interventions provided to work toward goal.     Personal Goals and Risk Factors at Admission - 07/20/15 1428    Core Components/Risk Factors/Patient Goals on Admission   Sedentary Yes   Intervention Provide  advice, education, support and counseling about physical activity/exercise needs.;Develop an individualized exercise prescription for aerobic and resistive training based on initial evaluation findings, risk stratification, comorbidities and participant's personal goals.   Expected Outcomes Achievement of increased cardiorespiratory fitness and enhanced flexibility, muscular endurance and strength shown through measurements of functional capacity and personal statement of participant.       Personal Goals Discharge:     Goals and Risk Factor Review - 09/06/15 1510    Core Components/Risk Factors/Patient Goals Review   Review I called Dr. Alla German office after I faxed heart rhythm strip from today with multiple PVC's. Belinda has no c/o with his PVC's. Dr. Ron Agee office was going to call Dr. Vickki Muff office since they said did not refer Kyle [name] to Cardiac Rehab but Dr. Vickki Muff office did.       Nutrition & Weight - Outcomes:     Pre Biometrics - 07/20/15 1426    Pre Biometrics   Height 5' 10.9" (1.801 m)   Weight 196 lb 12.8 oz (89.268 kg)   Waist Circumference 41 inches   Hip Circumference 40.5 inches   Waist to Hip Ratio 1.01 %   BMI (Calculated) 27.6         Post Biometrics - 09/11/15 1422     Post  Biometrics   Height 5' 10.9" (1.801 m)   Weight 195 lb (88.451 kg)   Waist Circumference 40 inches   Hip Circumference 40.5 inches   Waist to Hip Ratio 0.99 %   BMI (Calculated) 27.3      Nutrition:     Nutrition Therapy & Goals - 07/20/15 1257    Nutrition Therapy   Drug/Food Interactions Statins/Certain Fruits   Intervention Plan   Intervention Prescribe, educate and counsel regarding individualized specific dietary modifications aiming towards targeted core components such as weight, hypertension, lipid management, diabetes, heart failure and other comorbidities.   Expected Outcomes Short Term Goal: Understand basic principles of dietary content, such as calories,  fat, sodium, cholesterol and nutrients.;Long Term Goal: Adherence to prescribed nutrition plan.      Nutrition Discharge:     Nutrition Assessments - 09/01/15 0946    Rate Your Plate Scores   Post  Score 74   Post Score % 68.06 %      Education Questionnaire Score:     Knowledge Questionnaire Score - 09/01/15 1238    Knowledge Questionnaire Score   Post Score 18  24 was in error. Score was 18.      Goals reviewed with patient; copy given to patient.

## 2015-09-13 ENCOUNTER — Encounter: Payer: Medicare Other | Admitting: *Deleted

## 2015-09-13 DIAGNOSIS — I214 Non-ST elevation (NSTEMI) myocardial infarction: Secondary | ICD-10-CM

## 2015-09-13 DIAGNOSIS — Z9861 Coronary angioplasty status: Secondary | ICD-10-CM

## 2015-09-13 NOTE — Progress Notes (Signed)
Daily Session Note  Patient Details  Name: Kyle [name] MRN: [id number] Date of Birth: 06/27/1931 Referring Provider:    Encounter Date: 09/13/2015  Check In:     Session Check In - 09/13/15 0939    Check-In   Location [facility]   Staff Present Alberteen Sam, MA, ACSM RCEP, Exercise Physiologist;Mary Kellie Shropshire, RN;Amanda Sommer, BA, ACSM CEP, Exercise Physiologist;Susanne Bice, RN, BSN, CCRP;Diane Joya Gaskins, RN, BSN   Supervising physician immediately available to respond to emergencies See telemetry face sheet for immediately available ER MD   Medication changes reported     No   Fall or balance concerns reported    No   Warm-up and Cool-down Performed on first and last piece of equipment   Resistance Training Performed Yes   VAD Patient? No   Pain Assessment   Currently in Pain? No/denies   Multiple Pain Sites No         Goals Met:  Independence with exercise equipment Exercise tolerated well No report of cardiac concerns or symptoms Strength training completed today  Goals Unmet:  Not Applicable  Comments: Pt able to follow exercise prescription today without complaint.  Will continue to monitor for progression.    Dr. Emily Filbert is Medical Director for [facility] and [facility].

## 2015-09-15 ENCOUNTER — Encounter: Payer: Medicare Other | Admitting: *Deleted

## 2015-09-15 DIAGNOSIS — I214 Non-ST elevation (NSTEMI) myocardial infarction: Secondary | ICD-10-CM | POA: Diagnosis not present

## 2015-09-15 NOTE — Progress Notes (Signed)
Daily Session Note  Patient Details  Name: Kyle [name] MRN: [id number] Date of Birth: 10/15/1931 Referring Provider:    Encounter Date: 09/15/2015  Check In:     Session Check In - 09/15/15 0916    Check-In   Location [facility]   Staff Present Susanne Bice, RN, BSN, CCRP; , RN, BSN;Jessica Hawkins, MA, ACSM RCEP, Exercise Physiologist   Supervising physician immediately available to respond to emergencies See telemetry face sheet for immediately available ER MD   Fall or balance concerns reported    No   Resistance Training Performed Yes   VAD Patient? No   Pain Assessment   Currently in Pain? No/denies         Goals Met:  Proper associated with RPD/PD & O2 Sat Exercise tolerated well No report of cardiac concerns or symptoms  Goals Unmet:  Not Applicable  Comments:     Dr. Mark Miller is Medical Director for [facility] and [facility]. 

## 2015-09-20 ENCOUNTER — Encounter: Payer: Medicare Other | Admitting: *Deleted

## 2015-09-20 DIAGNOSIS — I214 Non-ST elevation (NSTEMI) myocardial infarction: Secondary | ICD-10-CM | POA: Diagnosis not present

## 2015-09-20 DIAGNOSIS — Z9861 Coronary angioplasty status: Secondary | ICD-10-CM

## 2015-09-20 NOTE — Progress Notes (Signed)
Discharge Summary  Patient Details  Name: Kyle [name] MRN: [id number] Date of Birth: 09-Sep-1931 Referring Provider:     Number of Visits: 36  Reason for Discharge:  Patient reached a stable level of exercise. Patient independent in their exercise.  Smoking History:  History  Smoking status  . Never Smoker   Smokeless tobacco  . Former Systems developer  . Types: Chew  . Quit date: 04/22/2004    Diagnosis:  NSTEMI (non-ST elevated myocardial infarction) ([place])  S/P PTCA (percutaneous transluminal coronary angioplasty)  ADL UCSD:   Initial Exercise Prescription:     Initial Exercise Prescription - 07/20/15 1400    Date of Initial Exercise RX and Referring Provider   Date 07/20/15   Treadmill   MPH 1.2   Grade 0   Minutes 10   Recumbant Bike   Level 2   RPM 40   Watts 20   Minutes 10   NuStep   Level 2   Watts 40   Minutes 10   Arm Ergometer   Level 1   Watts 10   Minutes 10   REL-XR   Level 2   Watts 40   Minutes 10   T5 Nustep   Level 1   Watts 15   Minutes 10   Biostep-RELP   Level 2   Watts 40   Minutes 10   Prescription Details   Frequency (times per week) 3   Duration Progress to 30 minutes of continuous aerobic without signs/symptoms of physical distress   Intensity   THRR REST +  30   Ratings of Perceived Exertion 11-15   Perceived Dyspnea 0-4   Progression   Progression Continue progressive overload as per policy without signs/symptoms or physical distress.   Resistance Training   Training Prescription Yes   Weight 1   Reps 10-15      Discharge Exercise Prescription (Final Exercise Prescription Changes):     Exercise Prescription Changes - 09/19/15 1400    Exercise Review   Progression Yes   Response to Exercise   Blood Pressure (Admit) 120/68 mmHg   Blood Pressure (Exercise) 138/82 mmHg   Blood Pressure (Exit) 132/80 mmHg   Heart Rate (Admit) 65 bpm   Heart Rate (Exercise) 76 bpm   Heart Rate (Exit) 64 bpm   Rating of Perceived  Exertion (Exercise) 12   Symptoms none   Duration Progress to 45 minutes of aerobic exercise without signs/symptoms of physical distress   Intensity Rest + 30   Progression   Progression Continue progressive overload as per policy without signs/symptoms or physical distress.   Average METs 2.5   Resistance Training   Training Prescription Yes   Weight 3   Reps 10-15   Interval Training   Interval Training No  not doing intervals, was having increased PVCs due to coffee   Equipment T5 Nustep;Biostep-RELP   Comments Use BS or T5 for interval training. RPE should range between 11-15 and intervals should be 2 min by 30 sec in duration. See specific workloads below.    Treadmill   MPH 1.5   Grade 1.5   Minutes 20   NuStep   Level 2   Watts 30   Minutes 28   REL-XR   Level 1   Watts 37   Minutes 15   T5 Nustep   Level 1   Watts 16   Minutes 15   Home Exercise Plan   Plans to continue exercise at [facility]  [employer] Classes   Frequency Add 1 additional day to program exercise sessions.      Functional Capacity:     6 Minute Walk      07/20/15 1424 09/11/15 1414     6 Minute Walk   Phase Initial Discharge    Distance 860 feet 875 feet    Walk Time 6 minutes 6 minutes    # of Rest Breaks  0    RPE 15 10    Symptoms Yes (comment)  Kyle [name] c/o being light headed dizzy at the end of his 6 min walk -his blood pressure was 120/60 and no new ectopy. No    Resting HR 62 bpm 65 bpm    Resting BP 138/60 mmHg 116/78 mmHg    Max Ex. HR 92 bpm 81 bpm    Max Ex. BP 120/60 mmHg 120/70 mmHg       Psychological, QOL, Others - Outcomes: PHQ 2/9: Depression screen [facility] 2/9 09/01/2015 07/20/2015 05/05/2015 04/20/2014 04/16/2013  Decreased Interest 2 3 0 0 0  Down, Depressed, Hopeless 2 3 1  0 0  PHQ - 2 Score 4 6 1  0 0  Altered sleeping 2 2 - - -  Tired, decreased energy 2 3 - - -  Change in appetite 0 0 - - -  Feeling bad or failure about yourself  1 3 - - -   Trouble concentrating 2 2 - - -  Moving slowly or fidgety/restless 0 3 - - -  Suicidal thoughts 0 0 - - -  PHQ-9 Score 11 19 - - -  Difficult doing work/chores Not difficult at all Somewhat difficult - - -    Quality of Life:     Quality of Life - 09/01/15 1237    Quality of Life Scores   Health/Function Post 22.37 %   Socioeconomic Post 26.44 %   Psych/Spiritual Post 23.36 %   Family Post 21.5 %   GLOBAL Post 23.37 %      Personal Goals: Goals established at orientation with interventions provided to work toward goal.     Personal Goals and Risk Factors at Admission - 07/20/15 1428    Core Components/Risk Factors/Patient Goals on Admission   Sedentary Yes   Intervention Provide advice, education, support and counseling about physical activity/exercise needs.;Develop an individualized exercise prescription for aerobic and resistive training based on initial evaluation findings, risk stratification, comorbidities and participant's personal goals.   Expected Outcomes Achievement of increased cardiorespiratory fitness and enhanced flexibility, muscular endurance and strength shown through measurements of functional capacity and personal statement of participant.       Personal Goals Discharge:     Goals and Risk Factor Review      08/02/15 0935 08/02/15 0940 08/04/15 0847 08/23/15 0935 08/23/15 0941   Core Components/Risk Factors/Patient Goals Review   Personal Goals Review Weight Management/Obesity;Hypertension  Lipids Sedentary Hypertension   Review Kyle [name] states he is trying to watch his weight, and is taking blood pressure medication as prescribed.  He has a blood pressure monitor at home and states he knows how to use it.  He states he has taken the group nutrition education class and does not wish to meet with the dietitian individually at this time.  Kyle [name] is compliant with his medications and will continue to follow the nutrition guidelines to help with cholesterol  control Kyle [name] stated one of his goals in coming to cardiac rehab was to increase his heart rate and to increase energy/stamina.  Kyle [name] is increasing his heart rate while exercising.  Kyle [name] also explained that he has more overall energy, but he is not where he wants to be.  Kyle [name] plans to continue exercising  and coming to Cardiac Rehab.   Kyle [name]'s BP has been around 130/70 upon check in and [id number] systolic and 0000000 to [id number] diastolic.    Expected Outcomes Kyle [name] will maintain weight and vital signs within acceptable parameters.  Individual session with dietitian will be made available if Kyle [name] chooses to pursue this option. Levine will maintain weight and vital signs within acceptable parameters.  Individual session with dietitian will be made available if Kyle [name] chooses to pursue this option. maintanence of lifestyle changes for cholesterol control Kyle [name] plans to continue exercising to increase his heart rate and to increase his energy.   Kyle [name]'s BP is controlled on current medication regimen.       09/06/15 1510 09/13/15 0918         Core Components/Risk Factors/Patient Goals Review   Personal Goals Review  Weight Management/Obesity      Review I called Dr. Alla German office after I faxed heart rhythm strip from today with multiple PVC's. Kyle [name] has no c/o with his PVC's. Kyle [name] office was going to call Dr. Vickki Muff office since they said did not refer Conall to Cardiac Rehab but Dr. Vickki Muff office did.  Kyle [name] stated he has lost 5lb in past 1 1/2 weeks by cutting back a little eating and feels more energy      Expected Outcomes  Kyle [name] will continue to manage his weight through watching portion sizes         Nutrition & Weight - Outcomes:     Pre Biometrics - 07/20/15 1426    Pre Biometrics   Height 5' 10.9" (1.801 m)   Weight 196 lb 12.8 oz (89.268 kg)   Waist Circumference 41 inches   Hip Circumference 40.5 inches   Waist to Hip Ratio 1.01 %   BMI (Calculated) 27.6         Post  Biometrics - 09/11/15 1422     Post  Biometrics   Height 5' 10.9" (1.801 m)   Weight 195 lb (88.451 kg)   Waist Circumference 40 inches   Hip Circumference 40.5 inches   Waist to Hip Ratio 0.99 %   BMI (Calculated) 27.3      Nutrition:     Nutrition Therapy & Goals - 07/20/15 1257    Nutrition Therapy   Drug/Food Interactions Statins/Certain Fruits   Intervention Plan   Intervention Prescribe, educate and counsel regarding individualized specific dietary modifications aiming towards targeted core components such as weight, hypertension, lipid management, diabetes, heart failure and other comorbidities.   Expected Outcomes Short Term Goal: Understand basic principles of dietary content, such as calories, fat, sodium, cholesterol and nutrients.;Long Term Goal: Adherence to prescribed nutrition plan.      Nutrition Discharge:     Nutrition Assessments - 09/01/15 0946    Rate Your Plate Scores   Post Score 74   Post Score % 68.06 %      Education Questionnaire Score:     Knowledge Questionnaire Score - 09/01/15 1238    Knowledge Questionnaire Score   Post Score 18  24 was in error. Score was 18.      Goals reviewed with patient; copy given to patient.

## 2015-09-20 NOTE — Progress Notes (Signed)
Daily Session Note  Patient Details  Name: Kyle [name] MRN: [id number] Date of Birth: Aug 20, 1931 Referring Provider:    Encounter Date: 09/20/2015  Check In:     Session Check In - 09/20/15 0831    Check-In   Location [facility]   Staff Present Kyle Cowden, RN;Kyle [name], BA, ACSM CEP, Exercise Physiologist;Kyle Mondor Luan Pulling, MA, ACSM RCEP, Exercise Physiologist;Kyle Joya Gaskins, RN, BSN   Supervising physician immediately available to respond to emergencies See telemetry face sheet for immediately available ER MD   Medication changes reported     No   Fall or balance concerns reported    No   Warm-up and Cool-down Performed on first and last piece of equipment   Resistance Training Performed Yes   VAD Patient? No   Pain Assessment   Currently in Pain? No/denies   Multiple Pain Sites No           Exercise Prescription Changes - 09/19/15 1400    Exercise Review   Progression Yes   Response to Exercise   Blood Pressure (Admit) 120/68 mmHg   Blood Pressure (Exercise) 138/82 mmHg   Blood Pressure (Exit) 132/80 mmHg   Heart Rate (Admit) 65 bpm   Heart Rate (Exercise) 76 bpm   Heart Rate (Exit) 64 bpm   Rating of Perceived Exertion (Exercise) 12   Symptoms none   Duration Progress to 45 minutes of aerobic exercise without signs/symptoms of physical distress   Intensity Rest + 30   Progression   Progression Continue progressive overload as per policy without signs/symptoms or physical distress.   Average METs 2.5   Resistance Training   Training Prescription Yes   Weight 3   Reps 10-15   Interval Training   Interval Training No  not doing intervals, was having increased PVCs due to coffee   Equipment T5 Nustep;Biostep-RELP   Comments Use BS or T5 for interval training. RPE should range between 11-15 and intervals should be 2 min by 30 sec in duration. See specific workloads below.    Treadmill   MPH 1.5   Grade 1.5   Minutes 20   NuStep   Level 2   Watts 30   Minutes 28   REL-XR   Level 1   Watts 37   Minutes 15   T5 Nustep   Level 1   Watts 16   Minutes 15   Home Exercise Plan   Plans to continue exercise at [place] 1 additional day to program exercise sessions.      Goals Met:  Independence with exercise equipment Exercise tolerated well No report of cardiac concerns or symptoms Strength training completed today  Goals Unmet:  Not Applicable  Comments:  Kyle [name] graduated today from cardiac rehab with 36 sessions completed.  Details of the patient's exercise prescription and what he needs to do in order to continue the prescription and progress.  Patient verbalized understanding.  Kyle [name] plans to continue to exercise by coming to [employer] at [facility].    Kyle [name] is Medical Director for [place] and [facility].

## 2015-09-20 NOTE — Progress Notes (Signed)
Cardiac Individual Treatment Plan  Patient Details  Name: DALTEN AMBROSINO MRN: [id number] Date of Birth: Feb 14, 1932 Referring Provider:    Initial Encounter Date:       Cardiac Rehab from 07/20/2015 in [facility] Cardiac and Pulmonary Rehab   Date  07/20/15      Visit Diagnosis: NSTEMI (non-ST elevated myocardial infarction) ([place])  S/P PTCA (percutaneous transluminal coronary angioplasty)  Patient's Home Medications on Admission:  Current outpatient prescriptions:  .  apixaban (ELIQUIS) 2.5 MG TABS tablet, Take 1 tablet (2.5 mg total) by mouth 2 (two) times daily., Disp: 60 tablet, Rfl: 5 .  atorvastatin (LIPITOR) 80 MG tablet, Take 1 tablet (80 mg total) by mouth daily at 6 PM., Disp: 30 tablet, Rfl: 11 .  clopidogrel (PLAVIX) 75 MG tablet, TAKE 1 TABLET BY MOUTH DAILY, Disp: 30 tablet, Rfl: 11 .  furosemide (LASIX) 20 MG tablet, Take 1 tablet (20 mg total) by mouth daily., Disp: 30 tablet, Rfl: 11 .  losartan (COZAAR) 50 MG tablet, Take 1 tablet (50 mg total) by mouth daily., Disp: 90 tablet, Rfl: 3 .  metoprolol tartrate (LOPRESSOR) 25 MG tablet, Take 1 tablet (25 mg total) by mouth 2 (two) times daily., Disp: 60 tablet, Rfl: 11 .  traMADol (ULTRAM) 50 MG tablet, TAKE ONE TABLET 3 TIMES DAILY AS NEEDED, Disp: 90 tablet, Rfl: 0  Past Medical History: Past Medical History  Diagnosis Date  . History of prostate cancer   . Hyperlipidemia   . Hypertension   . Arthritis   . Impaired fasting glucose   . Detached retina     right  . H/O dizziness   . Urgency of urination   . Cancer [facility])     prostate CA  . GERD (gastroesophageal reflux disease)   . Stroke [facility]) ~ 1995-02/2009 X 2    /notes 08/22/2010  . Claustrophobia     "EXTREMELY" (03/02/2015)    Tobacco Use: History  Smoking status  . Never Smoker   Smokeless tobacco  . Former Systems developer  . Types: Chew  . Quit date: 04/22/2004    Labs: Recent Review Flowsheet Data    Labs for ITP Cardiac and Pulmonary Rehab Latest Ref Rng  10/10/2010 04/16/2013 04/20/2014 05/10/2014 03/01/2015   Cholestrol 0 - 200 mg/dL 237(H) 194 222(H) - 194   LDLCALC 0 - 99 mg/dL - 128(H) 145(H) - 128(H)   LDLDIRECT - 164.4 - - - -   HDL >40 mg/dL 52.90 44.40 49.50 - 53   Trlycerides <150 mg/dL 182.0(H) 110.0 138.0 - 63   Hemoglobin A1c <5.7 % 6.7(H) - - 6.1(H) -       Exercise Target Goals:    Exercise Program Goal: Individual exercise prescription set with THRR, safety & activity barriers. Participant demonstrates ability to understand and report RPE using BORG scale, to self-measure pulse accurately, and to acknowledge the importance of the exercise prescription.  Exercise Prescription Goal: Starting with aerobic activity 30 plus minutes a day, 3 days per week for initial exercise prescription. Provide home exercise prescription and guidelines that participant acknowledges understanding prior to discharge.  Activity Barriers & Risk Stratification:     Activity Barriers & Cardiac Risk Stratification - 07/20/15 1253    Activity Barriers & Cardiac Risk Stratification   Activity Barriers Arthritis;Joint Problems;Shortness of Breath;Assistive Device   Cardiac Risk Stratification High      6 Minute Walk:     6 Minute Walk      07/20/15 1424 09/11/15 1414  6 Minute Walk   Phase Initial Discharge    Distance 860 feet 875 feet    Walk Time 6 minutes 6 minutes    # of Rest Breaks  0    RPE 15 10    Symptoms Yes (comment)  Davyon c/o being light headed dizzy at the end of his 6 min walk -his blood pressure was 120/60 and no new ectopy. No    Resting HR 62 bpm 65 bpm    Resting BP 138/60 mmHg 116/78 mmHg    Max Ex. HR 92 bpm 81 bpm    Max Ex. BP 120/60 mmHg 120/70 mmHg       Initial Exercise Prescription:     Initial Exercise Prescription - 07/20/15 1400    Date of Initial Exercise RX and Referring Provider   Date 07/20/15   Treadmill   MPH 1.2   Grade 0   Minutes 10   Recumbant Bike   Level 2   RPM 40   Watts  20   Minutes 10   NuStep   Level 2   Watts 40   Minutes 10   Arm Ergometer   Level 1   Watts 10   Minutes 10   REL-XR   Level 2   Watts 40   Minutes 10   T5 Nustep   Level 1   Watts 15   Minutes 10   Biostep-RELP   Level 2   Watts 40   Minutes 10   Prescription Details   Frequency (times per week) 3   Duration Progress to 30 minutes of continuous aerobic without signs/symptoms of physical distress   Intensity   THRR REST +  30   Ratings of Perceived Exertion 11-15   Perceived Dyspnea 0-4   Progression   Progression Continue progressive overload as per policy without signs/symptoms or physical distress.   Resistance Training   Training Prescription Yes   Weight 1   Reps 10-15      Perform Capillary Blood Glucose checks as needed.  Exercise Prescription Changes:     Exercise Prescription Changes      07/31/15 0800 08/30/15 0600 09/19/15 1400       Exercise Review   Progression Yes Yes Yes     Response to Exercise   Blood Pressure (Admit) 128/72 mmHg 138/62 mmHg 120/68 mmHg     Blood Pressure (Exercise) 138/72 mmHg 162/80 mmHg 138/82 mmHg     Blood Pressure (Exit) 130/70 mmHg 122/74 mmHg 132/80 mmHg     Heart Rate (Admit) 59 bpm 65 bpm 65 bpm     Heart Rate (Exercise) 77 bpm 75 bpm 76 bpm     Heart Rate (Exit) 61 bpm 66 bpm 64 bpm     Rating of Perceived Exertion (Exercise) _0 Symptoms none none none     Duration Progress to 30 minutes of continuous aerobic without signs/symptoms of physical distress Progress to 45 minutes of aerobic exercise without signs/symptoms of physical distress Progress to 45 minutes of aerobic exercise without signs/symptoms of physical distress     Intensity Rest + 30 Rest + 30 Rest + 30     Progression   Progression Continue progressive overload as per policy without signs/symptoms or physical distress. Continue progressive overload as per policy without signs/symptoms or physical distress. Continue progressive overload  as per policy without signs/symptoms or physical distress.     Average METs   2.5     Resistance  Training   Training Prescription Yes Yes Yes     Weight _0 Reps 10-15 10-15 10-15     Interval Training   Interval Training  Yes No  not doing intervals, was having increased PVCs due to coffee     Equipment  T5 Nustep;Biostep-RELP T5 Nustep;Biostep-RELP     Comments  Use BS or T5 for interval training. RPE should range between 11-15 and intervals should be 2 min by 30 sec in duration. See specific workloads below.  Use BS or T5 for interval training. RPE should range between 11-15 and intervals should be 2 min by 30 sec in duration. See specific workloads below.      Treadmill   MPH 1.6 2 1.5     Grade 0 0.5 1.5     Minutes _1 Recumbant Bike   Level --       RPM --       Watts --       Minutes --       NuStep   Level 2  2     Watts 50  30     Minutes 15  28     Arm Ergometer   Level --       Watts --       Minutes --       REL-XR   Level --  1     Watts --  37     Minutes --  15     T5 Nustep   Level -- 2 1     Watts -- 30  intervals between 20-30 watts 16     Minutes -- 20 15     Biostep-RELP   Level -- 3      Watts -- 40  intervals between 30-40 watts.       Minutes -- [place] to continue exercise at   [place] 1 additional day to program exercise sessions. Add 1 additional day to program exercise sessions.        Exercise Comments:     Exercise Comments      07/31/15 0823 07/31/15 0828 07/31/15 1401 08/09/15 1308 08/28/15 1159   Exercise Comments Christopher's weight has been increased to 3 lbs for the strength training portion of the exercise class. This increase was discussed with the patient and and he tolerated it with no symptoms.  From his initial exercise prescription Wyat has been consistantly using the TM and NS machines, and has increased his total exercise time  to up to 35 min in duration. He is able to tolerate this duration without rest breaks and has been asymptomatic.  Tyshawn stated that he was been a little sore since starting his exercise program, but not to the point of inhibiting his daily activities.  His energy level is about the same but is hoping to gain energy from participating in a structured exercise program.  I faxed all Cardiac Rehab rhythm strips to Dr. Levon Hedger offiice today. No c/o. No s/s but junctional rhythm and muliptiple PVC's couplets. No s/s.  Numair stated that he is continuing to feel stronger and have more energy since being consistant with his exercise routine. There were no specific activities that he is wanting to achieve in his daily life besides continuing to feel  better. Expected outcome: continue to progress with workload intensities and duration in cardiac  rehab class.      08/30/15 0655 09/01/15 3762 09/11/15 1424 09/19/15 1456     Exercise Comments Reviewed individualized exercise prescription and made increases per departmental policy. Exercise increases were discussed with the patient and they were able to perform the new work loads without issue (no signs or symptoms).  Discussed interval training recommendations and benefits with the patient.  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 Discussed options to exercise at [facility] in [facility] program.  6 min walk assessment performed today - see data sheet for detailed report.  After graduation Deidre Ala plans to exercise at the [facility] fitness center with his [employer]. Pt will graduate at next visit with 36 session completed.  He plans to continue to exercise by going to [employer] in [place] here.       Discharge Exercise Prescription (Final Exercise Prescription Changes):     Exercise Prescription Changes - 09/19/15 1400    Exercise Review   Progression Yes   Response to Exercise   Blood Pressure (Admit) 120/68 mmHg   Blood Pressure  (Exercise) 138/82 mmHg   Blood Pressure (Exit) 132/80 mmHg   Heart Rate (Admit) 65 bpm   Heart Rate (Exercise) 76 bpm   Heart Rate (Exit) 64 bpm   Rating of Perceived Exertion (Exercise) 12   Symptoms none   Duration Progress to 45 minutes of aerobic exercise without signs/symptoms of physical distress   Intensity Rest + 30   Progression   Progression Continue progressive overload as per policy without signs/symptoms or physical distress.   Average METs 2.5   Resistance Training   Training Prescription Yes   Weight 3   Reps 10-15   Interval Training   Interval Training No  not doing intervals, was having increased PVCs due to coffee   Equipment T5 Nustep;Biostep-RELP   Comments Use BS or T5 for interval training. RPE should range between 11-15 and intervals should be 2 min by 30 sec in duration. See specific workloads below.    Treadmill   MPH 1.5   Grade 1.5   Minutes 20   NuStep   Level 2   Watts 30   Minutes 28   REL-XR   Level 1   Watts 37   Minutes 15   T5 Nustep   Level 1   Watts 16   Minutes 15   Home Exercise Plan   Plans to continue exercise at [place] 1 additional day to program exercise sessions.      Nutrition:  Target Goals: Understanding of nutrition guidelines, daily intake of sodium <1551m, cholesterol <2081m calories 30% from fat and 7% or less from saturated fats, daily to have 5 or more servings of fruits and vegetables.  Biometrics:     Pre Biometrics - 07/20/15 1426    Pre Biometrics   Height 5' 10.9" (1.801 m)   Weight 196 lb 12.8 oz (89.268 kg)   Waist Circumference 41 inches   Hip Circumference 40.5 inches   Waist to Hip Ratio 1.01 %   BMI (Calculated) 27.6         Post Biometrics - 09/11/15 1422     Post  Biometrics   Height 5' 10.9" (1.801 m)   Weight 195 lb (88.451 kg)   Waist Circumference 40 inches   Hip Circumference 40.5 inches   Waist to  Hip Ratio 0.99 %   BMI  (Calculated) 27.3      Nutrition Therapy Plan and Nutrition Goals:     Nutrition Therapy & Goals - 07/20/15 1257    Nutrition Therapy   Drug/Food Interactions Statins/Certain Fruits   Intervention Plan   Intervention Prescribe, educate and counsel regarding individualized specific dietary modifications aiming towards targeted core components such as weight, hypertension, lipid management, diabetes, heart failure and other comorbidities.   Expected Outcomes Short Term Goal: Understand basic principles of dietary content, such as calories, fat, sodium, cholesterol and nutrients.;Long Term Goal: Adherence to prescribed nutrition plan.      Nutrition Discharge: Rate Your Plate Scores:     Nutrition Assessments - 09/01/15 0946    Rate Your Plate Scores   Post Score 74   Post Score % 68.06 %      Nutrition Goals Re-Evaluation:   Psychosocial: Target Goals: Acknowledge presence or absence of depression, maximize coping skills, provide positive support system. Participant is able to verbalize types and ability to use techniques and skills needed for reducing stress and depression.  Initial Review & Psychosocial Screening:     Initial Psych Review & Screening - 07/20/15 [place]? Yes   Barriers   Psychosocial barriers to participate in program There are no identifiable barriers or psychosocial needs.      Quality of Life Scores:     Quality of Life - 09/01/15 1237    Quality of Life Scores   Health/Function Post 22.37 %   Socioeconomic Post 26.44 %   Psych/Spiritual Post 23.36 %   Family Post 21.5 %   GLOBAL Post 23.37 %      PHQ-9:     Recent Review Flowsheet Data    Depression screen [facility] 2/9 09/01/2015 07/20/2015 05/05/2015 04/20/2014 04/16/2013   Decreased Interest 2 3 0 0 0   Down, Depressed, Hopeless _0 0 0   PHQ - 2 Score _1 0 0   Altered sleeping 2 2 - - -   Tired, decreased energy 2 3 - - -   Change in appetite 0  0 - - -   Feeling bad or failure about yourself  1 3 - - -   Trouble concentrating 2 2 - - -   Moving slowly or fidgety/restless 0 3 - - -   Suicidal thoughts 0 0 - - -   PHQ-9 Score 11 19 - - -   Difficult doing work/chores Not difficult at all Somewhat difficult - - -      Psychosocial Evaluation and Intervention:     Psychosocial Evaluation - 07/26/15 0930    Psychosocial Evaluation & Interventions   Interventions Encouraged to exercise with the program and follow exercise prescription;Relaxation education;Stress management education   Comments Counselor met with Mr. Colleran today for initial psychosocial evaluation.  He is a retired 82 year old who had a heart attack in November of 2016.  He has a strong support system with a spouse of 36 years, a son and sister who live close by and he is an active participant in his local church community.  Mr. Isenhower has arthritis and several other health issues and reports he has not slept well for quite some time, other than the past 3 nights.  He states his appetite is good, and he denies a history of depression or anxiety.  However, Mr. Leeth participated in the Depression education done  by this counselor today, and as a result admits to some current symptoms and his PHQ-9 was 6, which is moderately severe for depression.  Counselor assessed Mr. Scala current mood and he stated it has improved lately with walking more and sleeping better the past 3 nights.  He admits to stress being not able to do the things he likes to do, such as working out in the yard and mowing.  Counselor encouraged Mr. Tremblay to speak with his doctor about his depressive symptoms if they do not continue to improve in the near future with exercise.  He agreed to do so.  This counselor will follow with Mr. Carden on this and make recommendations accordingly.    Continued Psychosocial Services Needed --  Mr. stiehl is encouraged to speak with his Dr. about his current depressive  symptoms.  If these do not improve significantly in the near future with consistent exercise, counselor will make a referral to a therapist as well.        Psychosocial Re-Evaluation:     Psychosocial Re-Evaluation      08/16/15 1022 09/11/15 0943         Psychosocial Re-Evaluation   Comments Follow up with Mr. Ballow reporting an improvement in his mood and his sleep since he began this program.  He also states he is noticing an increase in his stamina as well.  Counselor commended him on this progress and will continue to follow as needed.  Follow up with Mr. Goytia reporting increased energy; walking further; & sleeping better since beginning this program.  He is scheduled to "graduate" next week and has also reporting weight loss and decreased anxiety since working out more consistently.  Mr. Leveque reports his mood has improved as well and his PHQ-9 dropped from a 19 to 55 which is still a little elevated; but he considers this "not difficult at all."  Mr. Guard reports he plans to continue exercising along with his wife at a local [place] program.  Counselor commended Mr. Ozaki for all of his hard work and progress made.             Vocational Rehabilitation: Provide vocational rehab assistance to qualifying candidates.   Vocational Rehab Evaluation & Intervention:     Vocational Rehab - 07/20/15 1253    Initial Vocational Rehab Evaluation & Intervention   Assessment shows need for Vocational Rehabilitation No      Education: Education Goals: Education classes will be provided on a weekly basis, covering required topics. Participant will state understanding/return demonstration of topics presented.  Learning Barriers/Preferences:     Learning Barriers/Preferences - 07/20/15 1253    Learning Barriers/Preferences   Learning Barriers Sight   Learning Preferences Individual Instruction      Education Topics: General Nutrition Guidelines/Fats and Fiber: -Group  instruction provided by verbal, written material, models and posters to present the general guidelines for heart healthy nutrition. Gives an explanation and review of dietary fats and fiber.   Controlling Sodium/Reading Food Labels: -Group verbal and written material supporting the discussion of sodium use in heart healthy nutrition. Review and explanation with models, verbal and written materials for utilization of the food label.          Cardiac Rehab from 09/20/2015 in [facility] Cardiac and Pulmonary Rehab   Date  07/31/15   Educator  CR   Instruction Review Code  2- meets goals/outcomes      Exercise Physiology & Risk Factors: - Group verbal and written  instruction with models to review the exercise physiology of the cardiovascular system and associated critical values. Details cardiovascular disease risk factors and the goals associated with each risk factor.      Cardiac Rehab from 09/20/2015 in [facility] Cardiac and Pulmonary Rehab   Date  08/09/15   Educator  BS   Instruction Review Code  2- meets goals/outcomes      Aerobic Exercise & Resistance Training: - Gives group verbal and written discussion on the health impact of inactivity. On the components of aerobic and resistive training programs and the benefits of this training and how to safely progress through these programs.      Cardiac Rehab from 09/20/2015 in [facility] Cardiac and Pulmonary Rehab   Date  08/14/15   Educator  RS   Instruction Review Code  2- meets goals/outcomes      Flexibility, Balance, General Exercise Guidelines: - Provides group verbal and written instruction on the benefits of flexibility and balance training programs. Provides general exercise guidelines with specific guidelines to those with heart or lung disease. Demonstration and skill practice provided.      Cardiac Rehab from 09/20/2015 in [facility] Cardiac and Pulmonary Rehab   Date  08/16/15   Educator  BS   Instruction Review Code  2- meets goals/outcomes       Stress Management: - Provides group verbal and written instruction about the health risks of elevated stress, cause of high stress, and healthy ways to reduce stress.      Cardiac Rehab from 09/20/2015 in [facility] Cardiac and Pulmonary Rehab   Date  08/23/15   Educator  Kathreen Cornfield, [facility]   Instruction Review Code  2- meets goals/outcomes      Depression: - Provides group verbal and written instruction on the correlation between heart/lung disease and depressed mood, treatment options, and the stigmas associated with seeking treatment.      Cardiac Rehab from 09/20/2015 in [facility] Cardiac and Pulmonary Rehab   Date  09/20/15   Educator  [facility]   Instruction Review Code  2- meets goals/outcomes      Anatomy & Physiology of the Heart: - Group verbal and written instruction and models provide basic cardiac anatomy and physiology, with the coronary electrical and arterial systems. Review of: AMI, Angina, Valve disease, Heart Failure, Cardiac Arrhythmia, Pacemakers, and the ICD.      Cardiac Rehab from 09/20/2015 in [facility] Cardiac and Pulmonary Rehab   Date  08/21/15   Educator  SB   Instruction Review Code  2- meets goals/outcomes      Cardiac Procedures: - Group verbal and written instruction and models to describe the testing methods done to diagnose heart disease. Reviews the outcomes of the test results. Describes the treatment choices: Medical Management, Angioplasty, or Coronary Bypass Surgery.      Cardiac Rehab from 09/20/2015 in [facility] Cardiac and Pulmonary Rehab   Date  08/28/15   Educator  CE   Instruction Review Code  2- meets goals/outcomes      Cardiac Medications: - Group verbal and written instruction to review commonly prescribed medications for heart disease. Reviews the medication, class of the drug, and side effects. Includes the steps to properly store meds and maintain the prescription regimen.      Cardiac Rehab from 09/20/2015 in [facility] Cardiac and Pulmonary Rehab   Date   09/06/15   Educator  SB   Instruction Review Code  2- meets goals/outcomes [Part II:  Cardiac Medications]  Go Sex-Intimacy & Heart Disease, Get SMART - Goal Setting: - Group verbal and written instruction through game format to discuss heart disease and the return to sexual intimacy. Provides group verbal and written material to discuss and apply goal setting through the application of the S.M.A.R.T. Method.      Cardiac Rehab from 09/20/2015 in [facility] Cardiac and Pulmonary Rehab   Date  08/28/15   Educator  CE   Instruction Review Code  2- meets goals/outcomes      Other Matters of the Heart: - Provides group verbal, written materials and models to describe Heart Failure, Angina, Valve Disease, and Diabetes in the realm of heart disease. Includes description of the disease process and treatment options available to the cardiac patient.      Cardiac Rehab from 09/20/2015 in [facility] Cardiac and Pulmonary Rehab   Date  08/07/15   Educator  SB   Instruction Review Code  2- meets goals/outcomes      Exercise & Equipment Safety: - Individual verbal instruction and demonstration of equipment use and safety with use of the equipment.      Cardiac Rehab from 09/20/2015 in [facility] Cardiac and Pulmonary Rehab   Date  07/28/15   Educator  C. EnterkinRN   Instruction Review Code  1- partially meets, needs review/practice Sek needs extra help with the Treadmill,[employer] inc speed too m]      Infection Prevention: - Provides verbal and written material to individual with discussion of infection control including proper hand washing and proper equipment cleaning during exercise session.      Cardiac Rehab from 07/26/2015 in [facility] Cardiac and Pulmonary Rehab   Date  07/20/15   Educator  C. EnterkinRN   Instruction Review Code  2- meets goals/outcomes      Falls Prevention: - Provides verbal and written material to individual with discussion of falls prevention and safety.      Cardiac Rehab from  07/26/2015 in [facility] Cardiac and Pulmonary Rehab   Date  07/20/15   Educator  C. Garwin   Instruction Review Code  1- partially meets, needs review/practice      Diabetes: - Individual verbal and written instruction to review signs/symptoms of diabetes, desired ranges of glucose level fasting, after meals and with exercise. Advice that pre and post exercise glucose checks will be done for 3 sessions at entry of program.    Knowledge Questionnaire Score:     Knowledge Questionnaire Score - 09/01/15 1238    Knowledge Questionnaire Score   Post Score 18  24 was in error. Score was 18.      Core Components/Risk Factors/Patient Goals at Admission:     Personal Goals and Risk Factors at Admission - 07/20/15 1428    Core Components/Risk Factors/Patient Goals on Admission   Sedentary Yes   Intervention Provide advice, education, support and counseling about physical activity/exercise needs.;Develop an individualized exercise prescription for aerobic and resistive training based on initial evaluation findings, risk stratification, comorbidities and participant's personal goals.   Expected Outcomes Achievement of increased cardiorespiratory fitness and enhanced flexibility, muscular endurance and strength shown through measurements of functional capacity and personal statement of participant.      Core Components/Risk Factors/Patient Goals Review:      Goals and Risk Factor Review      08/02/15 0935 08/02/15 0940 08/04/15 0847 08/23/15 0935 08/23/15 0941   Core Components/Risk Factors/Patient Goals Review   Personal Goals Review Weight Management/Obesity;Hypertension  Lipids Sedentary Hypertension   Review Jovaun states  he is trying to watch his weight, and is taking blood pressure medication as prescribed.  He has a blood pressure monitor at home and states he knows how to use it.  He states he has taken the group nutrition education class and does not wish to meet with the dietitian  individually at this time.  Guillermo is compliant with his medications and will continue to follow the nutrition guidelines to help with cholesterol control Zaccai stated one of his goals in coming to cardiac rehab was to increase his heart rate and to increase energy/stamina.  Marjorie is increasing his heart rate while exercising.  Riker also explained that he has more overall energy, but he is not where he wants to be.  Melody plans to continue exercising  and coming to Cardiac Rehab.   Hunt's BP has been around 130/70 upon check in and 595G/387F systolic and 64P to 32R diastolic.    Expected Outcomes Talan will maintain weight and vital signs within acceptable parameters.  Individual session with dietitian will be made available if Dick chooses to pursue this option. Janthony will maintain weight and vital signs within acceptable parameters.  Individual session with dietitian will be made available if Hyde chooses to pursue this option. maintanence of lifestyle changes for cholesterol control Taris plans to continue exercising to increase his heart rate and to increase his energy.   Ismael's BP is controlled on current medication regimen.       09/06/15 1510 09/13/15 0918         Core Components/Risk Factors/Patient Goals Review   Personal Goals Review  Weight Management/Obesity      Review I called Dr. Alla German office after I faxed heart rhythm strip from today with multiple PVC's. Shafter has no c/o with his PVC's. Dr. Ron Agee office was going to call Dr. Vickki Muff office since they said did not refer Khyren to Cardiac Rehab but Dr. Vickki Muff office did.  Eivin stated he has lost 5lb in past 1 1/2 weeks by cutting back a little eating and feels more energy      Expected Outcomes  Melvyn will continue to manage his weight through watching portion sizes         Core Components/Risk Factors/Patient Goals at Discharge (Final Review):      Goals and Risk Factor Review - 09/13/15 0918    Core Components/Risk  Factors/Patient Goals Review   Personal Goals Review Weight Management/Obesity   Review Dammon stated he has lost 5lb in past 1 1/2 weeks by cutting back a little eating and feels more energy   Expected Outcomes Falcon will continue to manage his weight through watching portion sizes      ITP Comments:     ITP Comments      07/20/15 1430 08/02/15 0939 08/02/15 0940 08/09/15 1306 08/13/15 0940   ITP Comments  Deidre Ala uses his walker at times so I encouraged him and got him to use his walker with his 6 minute walk in the hall.  Tamari states he is trying to watch his weight, and is taking blood pressure medication as prescribed.  He has a blood pressure monitor at home and states he knows how to use it.  He states he has taken the group nutrition education class and does not wish to meet with the dietitian individually at this time Takuya will maintain weight and vital signs within acceptable parameters.  Individual session with dietitian will be made available if Kagen chooses to pursue this option. I  faxed all Cardiac Rehab rhythm strips to Dr. Levon Hedger offiice today. No c/o. No s/s but junctional rhythm and muliptiple PVC's couplets. No s/s.  30 day review.  Continue with ITP  New to program     08/28/15 0826 09/06/15 1505 09/10/15 1150 09/11/15 1102     ITP Comments Temporary medication change: on antibiotic for UTI I called Dr. Alla German office after I faxed heart rhythm strip from today with multiple PVC's. Sovereign has no c/o with his PVC's. Dr. Ron Agee office was going to call Dr. Vickki Muff office since they said did not refer Saadiq to Cardiac Rehab but Dr. Vickki Muff office did.  30 day review. Continue with ITP Villa Herb switched to decaf coffee this am (instead of his normal 2 cups of coffee with caffeine). Anguel's heart rhythm strips were so much better this am. Marshawn will cont to drink decaff he said.        Comments: Mordche completed Cardiac Rehab today -  36/36 sessions.

## 2015-09-25 ENCOUNTER — Other Ambulatory Visit: Payer: Self-pay

## 2015-09-25 ENCOUNTER — Other Ambulatory Visit: Payer: Self-pay | Admitting: Internal Medicine

## 2015-09-25 MED ORDER — APIXABAN 2.5 MG PO TABS: 2.5000 mg | ORAL_TABLET | Freq: Two times a day (BID) | ORAL | Status: DC

## 2015-09-25 NOTE — Telephone Encounter (Signed)
Approved: #90 x 0 

## 2015-09-25 NOTE — Telephone Encounter (Signed)
Last filled 08-15-15 #90 Last OV 05-05-15 Next OV 05-08-16

## 2015-09-25 NOTE — Telephone Encounter (Signed)
Verbal refill given to Arbie Cookey at the pharmacy

## 2015-11-20 ENCOUNTER — Other Ambulatory Visit: Payer: Self-pay

## 2015-11-20 MED ORDER — TRAMADOL HCL 50 MG PO TABS: ORAL_TABLET | ORAL | 0 refills | Status: DC

## 2015-11-20 NOTE — Telephone Encounter (Signed)
Left refill on voice mail at pharmacy  

## 2015-11-20 NOTE — Telephone Encounter (Signed)
Approved: #90 x 0 

## 2015-11-20 NOTE — Telephone Encounter (Signed)
Last filled 09-25-15 #90 Last OV 05-05-15 Next OV 05-08-16

## 2016-01-01 ENCOUNTER — Other Ambulatory Visit: Payer: Self-pay

## 2016-01-01 NOTE — Telephone Encounter (Signed)
Last Filled 11-20-15 #90 Last OV 05-05-15 Next OV 05-08-16

## 2016-01-02 MED ORDER — TRAMADOL HCL 50 MG PO TABS: ORAL_TABLET | ORAL | 0 refills | Status: DC

## 2016-01-02 NOTE — Telephone Encounter (Signed)
Verbal refill to Peterson at the pharmacy

## 2016-01-02 NOTE — Telephone Encounter (Signed)
Approved: #90 x 0 

## 2016-01-15 ENCOUNTER — Other Ambulatory Visit: Payer: Self-pay

## 2016-01-15 MED ORDER — FUROSEMIDE 20 MG PO TABS: 20.0000 mg | ORAL_TABLET | Freq: Every day | ORAL | 3 refills | Status: DC

## 2016-01-15 MED ORDER — ATORVASTATIN CALCIUM 80 MG PO TABS: 80.0000 mg | ORAL_TABLET | Freq: Every day | ORAL | 3 refills | Status: DC

## 2016-01-30 DIAGNOSIS — Z23 Encounter for immunization: Secondary | ICD-10-CM | POA: Diagnosis not present

## 2016-02-02 ENCOUNTER — Other Ambulatory Visit: Payer: Self-pay

## 2016-02-02 MED ORDER — TRAMADOL HCL 50 MG PO TABS: ORAL_TABLET | ORAL | 0 refills | Status: DC

## 2016-02-02 NOTE — Telephone Encounter (Signed)
Approved: #90 x 0 

## 2016-02-02 NOTE — Telephone Encounter (Signed)
Verbal refill given to [place] at pharmacy

## 2016-02-02 NOTE — Telephone Encounter (Signed)
Last filled 01-02-16 #90 Last OV 05-05-15 Next OV 05-08-16

## 2016-02-12 ENCOUNTER — Encounter: Payer: Self-pay | Admitting: Cardiology

## 2016-02-13 ENCOUNTER — Other Ambulatory Visit: Payer: Self-pay | Admitting: *Deleted

## 2016-02-13 DIAGNOSIS — I5021 Acute systolic (congestive) heart failure: Secondary | ICD-10-CM

## 2016-02-15 DIAGNOSIS — S59911A Unspecified injury of right forearm, initial encounter: Secondary | ICD-10-CM | POA: Diagnosis not present

## 2016-02-15 DIAGNOSIS — Z8739 Personal history of other diseases of the musculoskeletal system and connective tissue: Secondary | ICD-10-CM | POA: Diagnosis not present

## 2016-02-15 DIAGNOSIS — S6991XA Unspecified injury of right wrist, hand and finger(s), initial encounter: Secondary | ICD-10-CM | POA: Diagnosis not present

## 2016-02-15 DIAGNOSIS — M7989 Other specified soft tissue disorders: Secondary | ICD-10-CM | POA: Diagnosis not present

## 2016-02-15 DIAGNOSIS — M19031 Primary osteoarthritis, right wrist: Secondary | ICD-10-CM | POA: Diagnosis not present

## 2016-02-22 NOTE — Progress Notes (Signed)
Cardiology Office Note   Date:  02/23/2016   ID:  Kyle [name], DOB [birth year], MRN [id number]  PCP:  Kyle Simpler, MD  Cardiologist:   Minus Breeding, MD   Chief Complaint  Patient presents with  . Coronary Artery Disease      History of Present Illness: Kyle [name] is a 82 y.o. male who presents for follow up of CAD.   The patient was at [facility] 02/27/15-03/03/15 for NSTEMI, new systolic CHF and new onset atrial fibrillation. 2D echo showed an EF of 15%. Subsequent LHC showed CAD with 75% stenosed mid LAD s/p PCI with stent plus diffuse disease in the left PDA with an 80% lesion in the proximal nondominant RCA. He was placed on triple therapy with ASA, Plavix and low dose Eliquis, given his newly placed stent and atrial fibrillation. His CHA2DS2 VASc score is 5.  ASA has since been stopped and he is on Plavix + Eliquis.   He refused a LifeVest.    He did have a follow up echo  and his EF is now 35 - 40%.   Since I last saw him he did do cardiac rehab.  He had some PVCs noted with rehab. He returns for follow up.    He returns for follow-up. He's not having any acute complaints although he is tired when he does activity such as raking the leaves. He has to rest frequently. He's not describing any shortness of breath, PND or orthopnea. He's not having any palpitations, presyncope or syncope. He said no chest pressure, neck or arm discomfort. She describes easy bruising and bleeding from his skin small wounds  Past Medical History:  Diagnosis Date  . Arthritis   . Cancer [facility])    prostate CA  . Claustrophobia    "EXTREMELY" (03/02/2015)  . Detached retina    right  . GERD (gastroesophageal reflux disease)   . H/O dizziness   . History of prostate cancer   . Hyperlipidemia   . Hypertension   . Impaired fasting glucose   . Stroke [facility]) ~ 1995-02/2009 X 2   /notes 08/22/2010  . Urgency of urination     Past Surgical History:  Procedure Laterality Date  . CARDIAC  CATHETERIZATION  ~ 1995   Archie Endo 08/22/2010  . CARDIAC CATHETERIZATION N/A 03/02/2015   Procedure: Left Heart Cath and Coronary Angiography;  Surgeon: Jettie Booze, MD;  Location: [place] CV LAB;  Service: Cardiovascular;  Laterality: N/A;  . CARDIAC CATHETERIZATION  03/02/2015   Procedure: Coronary Stent Intervention;  Surgeon: Jettie Booze, MD;  Location: [place] CV LAB;  Service: Cardiovascular;;  . COLONOSCOPY W/ POLYPECTOMY    . EYE SURGERY Right   . HERNIA REPAIR     hx UHR/notes 08/22/2010  . INGUINAL HERNIA REPAIR Left    Archie Endo 08/22/2010  . LUMBAR LAMINECTOMY/DECOMPRESSION MICRODISCECTOMY  01/24/2012   Procedure: LUMBAR LAMINECTOMY/DECOMPRESSION MICRODISCECTOMY 1 LEVEL;  Surgeon: Winfield Cunas, MD;  Location: [place] [facility]UR[facility] OR[facility];  Service: Neurosurgery;  Laterality: Right;  RIGHT Lumbar Three-Four far lateral diskectomy  . [place]   Archie Endo 08/22/2010  . RETINAL DETACHMENT REPAIR W/ SCLERAL BUCKLE LE Right 08/2006   Archie Endo 08/22/2010  . TONSILLECTOMY    . UMBILICAL HERNIA REPAIR     hx/notes 08/22/2010     Current Outpatient Prescriptions  Medication Sig Dispense Refill  . apixaban (ELIQUIS) 2.5 MG TABS tablet Take 1 tablet (2.5 mg total) by mouth 2 (two) times daily.  60 tablet 5  . atorvastatin (LIPITOR) 80 MG tablet Take 1 tablet (80 mg total) by mouth daily at 6 PM. 30 tablet 3  . furosemide (LASIX) 20 MG tablet Take 1 tablet (20 mg total) by mouth daily. 30 tablet 3  . losartan (COZAAR) 50 MG tablet Take 1 tablet (50 mg total) by mouth daily. 90 tablet 3  . metoprolol tartrate (LOPRESSOR) 25 MG tablet Take 1 tablet (25 mg total) by mouth 2 (two) times daily. 60 tablet 11  . traMADol (ULTRAM) 50 MG tablet TAKE ONE TABLET 3 TIMES DAILY AS NEEDED 90 tablet 0   No current facility-administered medications for this visit.     Allergies:   Simvastatin     ROS:  Please see the history of present illness.   Otherwise, review of systems are positive for  not sleeping well.   All other systems are reviewed and negative.    PHYSICAL EXAM: VS:  BP (!) 148/60   Pulse (!) 57   Ht 5\' 10"  (1.778 m)   Wt 198 lb (89.8 kg)   BMI 28.41 kg/m  , BMI Body mass index is 28.41 kg/m. GENERAL:  Well appearing HEENT: right pupil is oval shaped, fundi not visualized NECK:  No jugular venous distention, waveform within normal limits, carotid upstroke brisk and symmetric, no bruits, no thyromegaly LYMPHATICS:  No cervical, inguinal adenopathy LUNGS:  Clear to auscultation bilaterally BACK:  No CVA tenderness CHEST:  Unremarkable HEART:  PMI not displaced or sustained,S1 and S2 within normal limits, no S3, no S4, no clicks, no rubs, 2 out of 6 apical early peaking systolic murmur radiates slightly out the aortic outflow tract, no diastolic murmurs ABD:  Flat, positive bowel sounds normal in frequency in pitch, no bruits, no rebound, no guarding, no midline pulsatile mass, no hepatomegaly, no splenomegaly EXT:  2 plus pulses throughout, mild ankle edema, no cyanosis no clubbing SKIN:  No rashes no nodules, bruising   EKG:  EKG is  ordered today. Sinus bradycardia rate 57, premature ventricular contractions, left axis deviation, borderline interventricular conduction delay, poor anterior R wave progression.   Recent Labs: 02/27/2015: B Natriuretic Peptide 1,011.3; TSH 3.319 03/03/2015: Magnesium 2.0 06/13/2015: BUN 16; Creat 1.52; Hemoglobin 13.8; Platelets 239; Potassium 4.3; Sodium 144    Lipid Panel    Component Value Date/Time   CHOL 194 03/01/2015 0327   TRIG 63 03/01/2015 0327   TRIG 119 05/01/2006 1043   HDL 53 03/01/2015 0327   CHOLHDL 3.7 03/01/2015 0327   VLDL 13 03/01/2015 0327   LDLCALC 128 (H) 03/01/2015 0327   LDLDIRECT 164.4 10/10/2010 1012      Wt Readings from Last 3 Encounters:  02/23/16 198 lb (89.8 kg)  09/11/15 195 lb (88.5 kg)  07/24/15 198 lb (89.8 kg)      Other studies Reviewed: Additional studies/ records that  were reviewed today include: none Review of the above records demonstrates:    ASSESSMENT AND PLAN:  CAD:  The patient has no ongoing angina.  He can stop his Plavix  ISCHEMIC CARDIOMYOPATHY:  His EF was improved.  He does not want an ICD but does not need one at this point.  Continue the current meds.   He does not want to take more medications.    ATRIAL FIB:   He is at the target dose of Eliquis given his creat that is at or above 1.5 and his age.     DYSLIPIDEMIA:  His lipids were OK at  the last check   FATIGUE:  I am going to check a CBC, TSH and BMET.    Current medicines are reviewed at length with the patient today.  The patient does not have concerns regarding medicines.  The following changes have been made:  As above  Labs/ tests ordered today include:    Orders Placed This Encounter  Procedures  . TSH  . CBC  . Basic Metabolic Panel (BMET)  . EKG 12-Lead     Disposition:   FU with me in six months.     Signed, Minus Breeding, MD  02/23/2016 1:10 PM    [facility]

## 2016-02-23 ENCOUNTER — Encounter: Payer: Self-pay | Admitting: Cardiology

## 2016-02-23 ENCOUNTER — Ambulatory Visit (INDEPENDENT_AMBULATORY_CARE_PROVIDER_SITE_OTHER): Payer: Medicare Other | Admitting: Cardiology

## 2016-02-23 VITALS — BP 148/60 | HR 57 | Ht 70.0 in | Wt 198.0 lb

## 2016-02-23 DIAGNOSIS — I10 Essential (primary) hypertension: Secondary | ICD-10-CM | POA: Diagnosis not present

## 2016-02-23 DIAGNOSIS — I209 Angina pectoris, unspecified: Secondary | ICD-10-CM

## 2016-02-23 DIAGNOSIS — I255 Ischemic cardiomyopathy: Secondary | ICD-10-CM | POA: Diagnosis not present

## 2016-02-23 DIAGNOSIS — Z79899 Other long term (current) drug therapy: Secondary | ICD-10-CM | POA: Diagnosis not present

## 2016-02-23 DIAGNOSIS — I25119 Atherosclerotic heart disease of native coronary artery with unspecified angina pectoris: Secondary | ICD-10-CM | POA: Diagnosis not present

## 2016-02-23 LAB — CBC
HCT: 38.3 % — ABNORMAL LOW (ref 38.5–50.0)
Hemoglobin: 12.1 g/dL — ABNORMAL LOW (ref 13.2–17.1)
MCH: 29 pg (ref 27.0–33.0)
MCHC: 31.6 g/dL — ABNORMAL LOW (ref 32.0–36.0)
MCV: 91.8 fL (ref 80.0–100.0)
MPV: 10.8 fL (ref 7.5–12.5)
Platelets: 255 10*3/uL (ref 140–400)
RBC: 4.17 MIL/uL — ABNORMAL LOW (ref 4.20–5.80)
RDW: 15.8 % — ABNORMAL HIGH (ref 11.0–15.0)
WBC: 8.6 10*3/uL (ref 3.8–10.8)

## 2016-02-23 LAB — TSH: TSH: 3.22 mIU/L (ref 0.40–4.50)

## 2016-02-23 NOTE — Patient Instructions (Signed)
Medication Instructions:  STOP- Plavix  Labwork: TSH, CBC, BMP today  Testing/Procedures: None Ordered  Follow-Up: Your physician wants you to follow-up in: 6 Months. You will receive a reminder letter in the mail two months in advance. If you don't receive a letter, please call our office to schedule the follow-up appointment.   Any Other Special Instructions Will Be Listed Below (If Applicable).   If you need a refill on your cardiac medications before your next appointment, please call your pharmacy.

## 2016-02-24 LAB — BASIC METABOLIC PANEL
BUN: 12 mg/dL (ref 7–25)
CO2: 28 mmol/L (ref 20–31)
Calcium: 8.3 mg/dL — ABNORMAL LOW (ref 8.6–10.3)
Chloride: 107 mmol/L (ref 98–110)
Creat: 1.28 mg/dL — ABNORMAL HIGH (ref 0.70–1.11)
Glucose, Bld: 105 mg/dL — ABNORMAL HIGH (ref 65–99)
Potassium: 3.8 mmol/L (ref 3.5–5.3)
Sodium: 143 mmol/L (ref 135–146)

## 2016-03-12 ENCOUNTER — Other Ambulatory Visit: Payer: Self-pay | Admitting: *Deleted

## 2016-03-12 DIAGNOSIS — I5021 Acute systolic (congestive) heart failure: Secondary | ICD-10-CM

## 2016-03-12 MED ORDER — METOPROLOL TARTRATE 25 MG PO TABS: 25.0000 mg | ORAL_TABLET | Freq: Two times a day (BID) | ORAL | 2 refills | Status: DC

## 2016-03-12 NOTE — Telephone Encounter (Signed)
E sent to pharmacy 

## 2016-03-18 ENCOUNTER — Other Ambulatory Visit: Payer: Self-pay

## 2016-03-18 NOTE — Telephone Encounter (Signed)
Last filled 02-02-16 #90 Last OV 05-05-15 Next OV 05-08-16

## 2016-03-19 MED ORDER — TRAMADOL HCL 50 MG PO TABS: ORAL_TABLET | ORAL | 0 refills | Status: DC

## 2016-03-19 NOTE — Telephone Encounter (Signed)
Left refill on voice mail at pharmacy  

## 2016-03-19 NOTE — Telephone Encounter (Signed)
Approved: #90 x 0 

## 2016-04-18 ENCOUNTER — Other Ambulatory Visit: Payer: Self-pay | Admitting: Cardiology

## 2016-04-18 DIAGNOSIS — I5021 Acute systolic (congestive) heart failure: Secondary | ICD-10-CM

## 2016-04-18 MED ORDER — LOSARTAN POTASSIUM 50 MG PO TABS: 50.0000 mg | ORAL_TABLET | Freq: Every day | ORAL | 3 refills | Status: DC

## 2016-04-18 NOTE — Telephone Encounter (Signed)
Rx(s) sent to pharmacy electronically.  

## 2016-05-02 ENCOUNTER — Other Ambulatory Visit: Payer: Self-pay | Admitting: *Deleted

## 2016-05-02 MED ORDER — APIXABAN 2.5 MG PO TABS: 2.5000 mg | ORAL_TABLET | Freq: Two times a day (BID) | ORAL | 10 refills | Status: DC

## 2016-05-08 ENCOUNTER — Encounter: Payer: Medicare Other | Admitting: Internal Medicine

## 2016-05-09 ENCOUNTER — Other Ambulatory Visit: Payer: Self-pay | Admitting: Internal Medicine

## 2016-05-10 NOTE — Telephone Encounter (Signed)
Approved: #90 x 0 

## 2016-05-10 NOTE — Telephone Encounter (Signed)
Verbal refill given to carol at pharmacy 

## 2016-05-10 NOTE — Telephone Encounter (Signed)
Last FIlled 03-19-16 #90 Last OV 05-05-15 Next OV being rescheduled from 05-09-16 due to weather

## 2016-06-11 ENCOUNTER — Encounter: Payer: Self-pay | Admitting: Internal Medicine

## 2016-06-11 ENCOUNTER — Ambulatory Visit (INDEPENDENT_AMBULATORY_CARE_PROVIDER_SITE_OTHER): Payer: Medicare Other | Admitting: Internal Medicine

## 2016-06-11 VITALS — BP 128/70 | HR 58 | Temp 97.3°F | Ht 69.5 in | Wt 194.5 lb

## 2016-06-11 DIAGNOSIS — Z7189 Other specified counseling: Secondary | ICD-10-CM | POA: Diagnosis not present

## 2016-06-11 DIAGNOSIS — G3184 Mild cognitive impairment, so stated: Secondary | ICD-10-CM

## 2016-06-11 DIAGNOSIS — I480 Paroxysmal atrial fibrillation: Secondary | ICD-10-CM

## 2016-06-11 DIAGNOSIS — I5022 Chronic systolic (congestive) heart failure: Secondary | ICD-10-CM

## 2016-06-11 DIAGNOSIS — N183 Chronic kidney disease, stage 3 unspecified: Secondary | ICD-10-CM

## 2016-06-11 DIAGNOSIS — I25119 Atherosclerotic heart disease of native coronary artery with unspecified angina pectoris: Secondary | ICD-10-CM

## 2016-06-11 DIAGNOSIS — Z0000 Encounter for general adult medical examination without abnormal findings: Secondary | ICD-10-CM

## 2016-06-11 MED ORDER — TRAMADOL HCL 50 MG PO TABS
ORAL_TABLET | ORAL | 0 refills | Status: DC
Start: 2016-06-11 — End: 2016-08-05

## 2016-06-11 NOTE — Assessment & Plan Note (Signed)
Now may be persistent Rate is fine On eliquis

## 2016-06-11 NOTE — Assessment & Plan Note (Signed)
Mild with no obvious progression Wife now with troubling cognitive issues

## 2016-06-11 NOTE — Progress Notes (Signed)
Subjective:    Patient ID: Kyle [name], male    DOB: [birth year], 83 y.o.   MRN: [id number]  HPI Here for Medicare wellness visit and follow up of chronic health conditions Reviewed form and advanced directives Reviewed other doctors No alcohol or tobacco (except rare chewing tobacco) Vision is okay Mild hearing issues--not a problem Tries to walk regularly He had 1 fall this year--no injury (doesn't remember what happened--just tripped? No LOC) No depression or anhedonia--though feels limited due to energy problems Mild memory problems--no progression. 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 apixaban (ELIQUIS) 2.5 MG TABS tablet Take 1 tablet (2.5 mg total) by mouth 2 (two) times daily. 60 tablet 10  . atorvastatin (LIPITOR) 80 MG tablet Take 1 tablet (80 mg total) by mouth daily at 6 PM. 30 tablet 3  . furosemide (LASIX) 20 MG tablet Take 1 tablet (20 mg total) by mouth daily. 30 tablet 3  . losartan (COZAAR) 50 MG tablet Take 1 tablet (50 mg total) by mouth daily. 90 tablet 3  . metoprolol tartrate (LOPRESSOR) 25 MG tablet Take 1 tablet (25 mg total) by mouth 2 (two) times daily. 180 tablet 2  . traMADol (ULTRAM) 50 MG tablet TAKE ONE TABLET BY MOUTH 3 TIMES DAILY AS NEEDED 90 tablet 0   No current facility-administered medications on  file prior to visit.     Allergies  Allergen Reactions  . Simvastatin     REACTION: myalgias    Past Medical History:  Diagnosis Date  . Arthritis   . Cancer [facility])    prostate CA  . Claustrophobia    "EXTREMELY" (03/02/2015)  . Detached retina    right  . GERD (gastroesophageal reflux disease)   . H/O dizziness   . History of prostate cancer   . Hyperlipidemia   . Hypertension   . Impaired fasting glucose   . Stroke [facility]) ~ 1995-02/2009 X 2   /notes 08/22/2010  . Urgency of urination     Past Surgical History:  Procedure Laterality Date  . CARDIAC CATHETERIZATION  ~ 1995   Archie Endo 08/22/2010  . CARDIAC CATHETERIZATION N/A 03/02/2015   Procedure: Left Heart Cath and Coronary Angiography;  Surgeon: Jettie Booze, MD;  Location: [place] CV LAB;  Service: Cardiovascular;  Laterality: N/A;  . CARDIAC CATHETERIZATION  03/02/2015   Procedure: Coronary Stent Intervention;  Surgeon: Jettie Booze, MD;  Location: [place] CV LAB;  Service: Cardiovascular;;  . COLONOSCOPY W/ POLYPECTOMY    . EYE SURGERY Right   . HERNIA REPAIR     hx UHR/notes 08/22/2010  . INGUINAL HERNIA REPAIR Left    Archie Endo 08/22/2010  . LUMBAR LAMINECTOMY/DECOMPRESSION MICRODISCECTOMY  01/24/2012   Procedure: LUMBAR LAMINECTOMY/DECOMPRESSION MICRODISCECTOMY 1 LEVEL;  Surgeon: Winfield Cunas, MD;  Location: [place] NEURO ORS;  Service: Neurosurgery;  Laterality: Right;  RIGHT Lumbar Three-Four far lateral diskectomy  . [place]   Archie Endo 08/22/2010  . RETINAL DETACHMENT REPAIR W/ SCLERAL BUCKLE LE Right 08/2006   Archie Endo 08/22/2010  . TONSILLECTOMY    . UMBILICAL HERNIA REPAIR     hx/notes 08/22/2010    Family History  Problem Relation Age of Onset  . Heart disease Mother   . Coronary artery disease Mother   . Heart disease Father   . Coronary artery disease Father   . Heart disease Brother   . Heart disease Brother   . Cancer Brother     prostate  . Diabetes Neg Hx     Social  History   Social History  . Marital status: Married    Spouse name: N/A  . Number of children: 1  . Years of education: N/A   Occupational History  . retired Retired   Social History Main Topics  . Smoking status: Never Smoker  . Smokeless tobacco: Former Systems developer    Types: Chew    Quit date: 04/22/2004  . Alcohol use No  . Drug use: No  . Sexual activity: Not on file   Other Topics Concern  . Not on file   Social History Narrative   Has living will   Wife is health care POA   Would accept CPR but no prolonged machines.    Would not want a feeding tube if cognitively unaware            Review of Systems Doesn't sleep well--only 2-3 hours at a time Stress in household--- wife's memory going and he has to monitor her meds, etc Teeth okay-- keeps up with dentist Bowels are okay. No blood in stool Voids fine. Nocturia x 3 (stable). No incontinence Wears seat belt. Tractor has rollbar but no seat belt Some back and arthritic pains--- uses the tramadol intermittently No rash or suspicious skin lesions    Objective:   Physical Exam  Constitutional: He is oriented to person, place, and time. He appears well-nourished. No distress.  HENT:  Mouth/Throat: Oropharynx is clear and moist. No oropharyngeal exudate.  Full dentures  Neck: Neck supple. No thyromegaly present.  Cardiovascular: Normal rate, normal heart sounds and intact distal pulses.  Exam reveals no gallop.   No murmur heard. irregular  Pulmonary/Chest: Effort normal and breath sounds normal. No respiratory distress. He has no wheezes. He has no rales.  Abdominal: Soft. There is no tenderness.  Musculoskeletal: He exhibits no edema.  Lymphadenopathy:    He has no cervical adenopathy.  Neurological: He is alert and oriented to person, place, and time.  Plresident-- "[place]" [phone] D-o-r-[id number] Recall 3/3  Skin: No rash noted. No erythema.  Psychiatric: He has a normal mood and affect. His  behavior is normal.          Assessment & Plan:

## 2016-06-11 NOTE — Addendum Note (Signed)
Addended by: Pilar Grammes on: 06/11/2016 02:50 PM   Modules accepted: Orders

## 2016-06-11 NOTE — Progress Notes (Signed)
Pre visit review using our clinic review tool, if applicable. No additional management support is needed unless otherwise documented below in the visit note. 

## 2016-06-11 NOTE — Assessment & Plan Note (Signed)
See social history 

## 2016-06-11 NOTE — Assessment & Plan Note (Signed)
Stable pattern of DOE On appropriate regimen

## 2016-06-11 NOTE — Assessment & Plan Note (Signed)
I have personally reviewed the Medicare Annual Wellness questionnaire and have noted 1. The patient's medical and social history 2. Their use of alcohol, tobacco or illicit drugs 3. Their current medications and supplements 4. The patient's functional ability including ADL's, fall risks, home safety risks and hearing or visual             impairment. 5. Diet and physical activities 6. Evidence for depression or mood disorders  The patients weight, height, BMI and visual acuity have been recorded in the chart I have made referrals, counseling and provided education to the patient based review of the above and I have provided the pt with a written personalized care plan for preventive services.  I have provided you with a copy of your personalized plan for preventive services. Please take the time to review along with your updated medication list.  No cancer screening due to age UTD on imms Discussed exercise

## 2016-06-11 NOTE — Assessment & Plan Note (Signed)
Stable with last blood work

## 2016-06-11 NOTE — Assessment & Plan Note (Signed)
Always feels fatigued but no symptoms suggesting decompensated CHF

## 2016-06-13 ENCOUNTER — Other Ambulatory Visit: Payer: Self-pay

## 2016-06-13 MED ORDER — FUROSEMIDE 20 MG PO TABS: 20.0000 mg | ORAL_TABLET | Freq: Every day | ORAL | 1 refills | Status: DC

## 2016-06-13 MED ORDER — ATORVASTATIN CALCIUM 80 MG PO TABS: 80.0000 mg | ORAL_TABLET | Freq: Every day | ORAL | 1 refills | Status: DC

## 2016-08-05 ENCOUNTER — Other Ambulatory Visit: Payer: Self-pay | Admitting: Internal Medicine

## 2016-08-06 NOTE — Telephone Encounter (Signed)
Rx called in to requested pharmacy 

## 2016-08-06 NOTE — Telephone Encounter (Signed)
Last Rx 06/11/2016. Last OV same. pls advise

## 2016-08-06 NOTE — Telephone Encounter (Signed)
Approved: #90 x 0 

## 2016-09-18 ENCOUNTER — Other Ambulatory Visit: Payer: Self-pay | Admitting: Internal Medicine

## 2016-09-18 NOTE — Telephone Encounter (Signed)
Verbal refill given to Nicole at the pharmacy 

## 2016-09-18 NOTE — Telephone Encounter (Signed)
Last filled 08-06-16 #90 Last OV 06-11-16 Next OV 06-16-17 No urine drug screen on file

## 2016-09-18 NOTE — Telephone Encounter (Signed)
Approved: #90 x 0 Needs UDS at next visit

## 2016-09-19 DIAGNOSIS — H524 Presbyopia: Secondary | ICD-10-CM | POA: Diagnosis not present

## 2016-09-19 DIAGNOSIS — H04129 Dry eye syndrome of unspecified lacrimal gland: Secondary | ICD-10-CM | POA: Diagnosis not present

## 2016-09-19 DIAGNOSIS — H52223 Regular astigmatism, bilateral: Secondary | ICD-10-CM | POA: Diagnosis not present

## 2016-09-19 DIAGNOSIS — H5203 Hypermetropia, bilateral: Secondary | ICD-10-CM | POA: Diagnosis not present

## 2016-10-05 DIAGNOSIS — M79641 Pain in right hand: Secondary | ICD-10-CM | POA: Diagnosis not present

## 2016-10-09 ENCOUNTER — Encounter: Payer: Self-pay | Admitting: Cardiology

## 2016-10-24 NOTE — Progress Notes (Signed)
Cardiology Office Note   Date:  10/26/2016   ID:  Kyle [name], DOB [birth year], MRN [id number]  PCP:  Venia Carbon, MD  Cardiologist:   Minus Breeding, MD   Chief Complaint  Patient presents with  . Coronary Artery Disease  . Fatigue      History of Present Illness: Kyle [name] is a 83 y.o. male who presents for follow up of CAD.   The patient was at [facility] 02/27/15-03/03/15 for NSTEMI, new systolic CHF and new onset atrial fibrillation. 2D echo showed an EF of 15%. Subsequent LHC showed CAD with 75% stenosed mid LAD s/p PCI with stent plus diffuse disease in the left PDA with an 80% lesion in the proximal nondominant RCA. He was placed on triple therapy with ASA, Plavix and low dose Eliquis, given his newly placed stent and atrial fibrillation.   ASA has since been stopped and he is on Plavix + Eliquis.   He refused a LifeVest.    He did have a follow up echo  and his EF was  35 - 40%.  At the last visit I stopped Plavix.    He says that he is very fatigued and sleeps a lot.  However, he has no acute complaints.  The patient denies any new symptoms such as chest discomfort, neck or arm discomfort. There has been no new shortness of breath, PND or orthopnea. There have been no reported palpitations, presyncope or syncope.    Past Medical History:  Diagnosis Date  . Arthritis   . Cancer [facility])    prostate CA  . Claustrophobia    "EXTREMELY" (03/02/2015)  . Detached retina    right  . GERD (gastroesophageal reflux disease)   . H/O dizziness   . History of prostate cancer   . Hyperlipidemia   . Hypertension   . Impaired fasting glucose   . Stroke [facility]) ~ 1995-02/2009 X 2   /notes 08/22/2010  . Urgency of urination     Past Surgical History:  Procedure Laterality Date  . CARDIAC CATHETERIZATION  ~ 1995   Archie Endo 08/22/2010  . CARDIAC CATHETERIZATION N/A 03/02/2015   Procedure: Left Heart Cath and Coronary Angiography;  Surgeon: Jettie Booze, MD;  Location: [place] CV LAB;  Service: Cardiovascular;  Laterality: N/A;  . CARDIAC CATHETERIZATION  03/02/2015   Procedure: Coronary Stent Intervention;  Surgeon: Jettie Booze, MD;  Location: [place] CV LAB;  Service: Cardiovascular;;  . COLONOSCOPY W/ POLYPECTOMY    . EYE SURGERY Right   . HERNIA REPAIR     hx UHR/notes 08/22/2010  . INGUINAL HERNIA REPAIR Left    Archie Endo 08/22/2010  . LUMBAR LAMINECTOMY/DECOMPRESSION MICRODISCECTOMY  01/24/2012   Procedure: LUMBAR LAMINECTOMY/DECOMPRESSION MICRODISCECTOMY 1 LEVEL;  Surgeon: Winfield Cunas, MD;  Location: [place] [facility] OR[facility];  Service: Neurosurgery;  Laterality: Right;  RIGHT Lumbar Three-Four far lateral diskectomy  . [place]   Archie Endo 08/22/2010  . RETINAL DETACHMENT REPAIR W/ SCLERAL BUCKLE LE Right 08/2006   Archie Endo 08/22/2010  . TONSILLECTOMY    . UMBILICAL HERNIA REPAIR     hx/notes 08/22/2010     Current Outpatient Prescriptions  Medication Sig Dispense Refill  . apixaban (ELIQUIS) 2.5 MG TABS tablet Take 1 tablet (2.5 mg total) by mouth 2 (two) times daily. 60 tablet 10  . atorvastatin (LIPITOR) 80 MG tablet Take 1 tablet (80 mg total) by mouth daily at 6 PM. PLEASE CONTACT OFFICE FOR ADDITIONAL REFILLS 30  tablet 1  . furosemide (LASIX) 20 MG tablet Take 1 tablet (20 mg total) by mouth daily. PLEASE CONTACT OFFICE FOR ADDITIONAL REFILLS 30 tablet 1  . losartan (COZAAR) 50 MG tablet Take 1 tablet (50 mg total) by mouth daily. 90 tablet 3  . traMADol (ULTRAM) 50 MG tablet TAKE ONE TABLET BY MOUTH 3 TIMES DAILY AS NEEDED 90 tablet 0   No current facility-administered medications for this visit.     Allergies:   Simvastatin     ROS:  Please see the history of present illness.   Otherwise, review of systems are positive for fatigue .   All other systems are reviewed and negative.    PHYSICAL EXAM: VS:  BP (!) 148/72   Pulse (!) 50   Ht 5' 9.5" (1.765 m)   Wt 194 lb (88 kg)   BMI 28.24 kg/m  , BMI Body mass index is 28.24  kg/m.  GENERAL:  Frail appearing NECK:  No jugular venous distention, waveform within normal limits, carotid upstroke brisk and symmetric, no bruits, no thyromegaly LUNGS:  Clear to auscultation bilaterally BACK:  No CVA tenderness CHEST:  Unremarkable HEART:  PMI not displaced or sustained,S1 and S2 within normal limits, no S3, no S4, no clicks, no rubs, 2 out of 6 apical systolic murmur radiating slightly out the aortic outflow tract, no diastolic murmurs ABD:  Flat, positive bowel sounds normal in frequency in pitch, no bruits, no rebound, no guarding, no midline pulsatile mass, no hepatomegaly, no splenomegaly EXT:  2 plus pulses throughout, no edema, no cyanosis no clubbing   EKG:  EKG is    ordered today. Sinus bradycardia rate 50 , premature ventricular contractions, left axis deviation, borderline interventricular conduction delay, poor anterior R wave progression. PVCs   Recent Labs: 02/23/2016: BUN 12; Creat 1.28; Hemoglobin 12.1; Platelets 255; Potassium 3.8; Sodium 143; TSH 3.22 10/25/2016: ALT 10    Lipid Panel    Component Value Date/Time   CHOL 134 10/25/2016 1156   TRIG 61 10/25/2016 1156   TRIG 119 05/01/2006 1043   HDL 66 10/25/2016 1156   CHOLHDL 2.0 10/25/2016 1156   CHOLHDL 3.7 03/01/2015 0327   VLDL 13 03/01/2015 0327   LDLCALC 56 10/25/2016 1156   LDLDIRECT 164.4 10/10/2010 1012      Wt Readings from Last 3 Encounters:  10/25/16 194 lb (88 kg)  06/11/16 194 lb 8 oz (88.2 kg)  02/23/16 198 lb (89.8 kg)      Other studies Reviewed: Additional studies/ records that were reviewed today include: None Review of the above records demonstrates:     ASSESSMENT AND PLAN:  CAD:  The patient has no new sypmtoms.  No further cardiovascular testing is indicated.  We will continue with aggressive risk reduction and meds as listed.  ISCHEMIC CARDIOMYOPATHY:    His EF was improved.  He does not want an ICD but does not need one at this point.  Given his  significant tiredness in his low heart rate I am going to stop his beta blocker to see if he has any improvement in this.  ATRIAL FIB:   Kyle [name] has a CHA2DS2 - VASc score of 7.   He is on the appropriate dose of Eliquis  DYSLIPIDEMIA:  I will check a lipid profile today.  FATIGUE:   As above.    Current medicines are reviewed at length with the patient today.  The patient does not have concerns regarding medicines.  The  following changes have been made:   As above  Labs/ tests ordered today include:    Orders Placed This Encounter  Procedures  . Lipid panel  . Hepatic function panel  . EKG 12-Lead     Disposition:   FU with APP in six  months.     Signed, Minus Breeding, MD  10/26/2016 7:53 PM    Kyle [name]

## 2016-10-25 ENCOUNTER — Encounter: Payer: Self-pay | Admitting: Cardiology

## 2016-10-25 ENCOUNTER — Ambulatory Visit (INDEPENDENT_AMBULATORY_CARE_PROVIDER_SITE_OTHER): Payer: Medicare Other | Admitting: Cardiology

## 2016-10-25 VITALS — BP 148/72 | HR 50 | Ht 69.5 in | Wt 194.0 lb

## 2016-10-25 DIAGNOSIS — R5383 Other fatigue: Secondary | ICD-10-CM

## 2016-10-25 DIAGNOSIS — I2589 Other forms of chronic ischemic heart disease: Secondary | ICD-10-CM | POA: Diagnosis not present

## 2016-10-25 DIAGNOSIS — I255 Ischemic cardiomyopathy: Secondary | ICD-10-CM | POA: Diagnosis not present

## 2016-10-25 DIAGNOSIS — I480 Paroxysmal atrial fibrillation: Secondary | ICD-10-CM

## 2016-10-25 LAB — HEPATIC FUNCTION PANEL
ALT: 10 IU/L (ref 0–44)
AST: 18 IU/L (ref 0–40)
Albumin: 3.9 g/dL (ref 3.5–4.7)
Alkaline Phosphatase: 79 IU/L (ref 39–117)
Bilirubin Total: 0.6 mg/dL (ref 0.0–1.2)
Bilirubin, Direct: 0.19 mg/dL (ref 0.00–0.40)
Total Protein: 7.1 g/dL (ref 6.0–8.5)

## 2016-10-25 LAB — LIPID PANEL
Chol/HDL Ratio: 2 ratio (ref 0.0–5.0)
Cholesterol, Total: 134 mg/dL (ref 100–199)
HDL: 66 mg/dL (ref >39)
LDL Calculated: 56 mg/dL (ref 0–99)
Triglycerides: 61 mg/dL (ref 0–149)
VLDL Cholesterol Cal: 12 mg/dL (ref 5–40)

## 2016-10-25 NOTE — Patient Instructions (Signed)
Medication Instructions:  STOP Metoprolol  Labwork: Your physician recommends that you return for lab work in: TODAY-LIPID/ LIVER   Testing/Procedures: None   Follow-Up: Your physician wants you to follow-up in: 6 MONTHS WITH EXTENDER. You will receive a reminder letter in the mail two months in advance. If you don't receive a letter, please call our office to schedule the follow-up appointment.  Any Other Special Instructions Will Be Listed Below (If Applicable).     If you need a refill on your cardiac medications before your next appointment, please call your pharmacy.

## 2016-10-26 ENCOUNTER — Encounter: Payer: Self-pay | Admitting: Cardiology

## 2016-10-26 DIAGNOSIS — R5383 Other fatigue: Secondary | ICD-10-CM | POA: Insufficient documentation

## 2016-10-31 ENCOUNTER — Other Ambulatory Visit: Payer: Self-pay

## 2016-10-31 MED ORDER — FUROSEMIDE 20 MG PO TABS: 20.0000 mg | ORAL_TABLET | Freq: Every day | ORAL | 10 refills | Status: DC

## 2016-11-04 ENCOUNTER — Other Ambulatory Visit: Payer: Self-pay | Admitting: Internal Medicine

## 2016-11-04 NOTE — Telephone Encounter (Signed)
Last filled 09-18-16 #90 Last OV 06-11-16 Next OV 06-16-17

## 2016-11-04 NOTE — Telephone Encounter (Signed)
Approved: #90 x 0 

## 2016-11-04 NOTE — Telephone Encounter (Signed)
Rx called in to pharmacy. 

## 2016-11-13 ENCOUNTER — Other Ambulatory Visit: Payer: Self-pay | Admitting: Cardiology

## 2016-12-11 ENCOUNTER — Other Ambulatory Visit: Payer: Self-pay | Admitting: Cardiology

## 2016-12-25 ENCOUNTER — Other Ambulatory Visit: Payer: Self-pay | Admitting: Internal Medicine

## 2016-12-25 NOTE — Telephone Encounter (Signed)
Verbal refill given to Elmyra Ricks at the pharmacy

## 2016-12-25 NOTE — Telephone Encounter (Signed)
Last filled 11-04-16 #90 Last OV 06-11-16 Next OV 06-16-17

## 2016-12-25 NOTE — Telephone Encounter (Signed)
Approved: #90 x 0 

## 2017-01-16 IMAGING — NM NM PULMONARY VENT & PERF
15 series · 15 of 15 positions shown · non-contrast
Comparison: Current chest radiograph

CLINICAL DATA: Dyspnea for a few days. History of CHF. No chest
pain.

EXAM:
NUCLEAR MEDICINE VENTILATION - PERFUSION LUNG SCAN
TECHNIQUE: Ventilation images were obtained in multiple projections using
inhaled aerosol Kc-TTm DTPA. Perfusion images were obtained in
multiple projections after intravenous injection of Kc-TTm MAA.
RADIOPHARMACEUTICALS:  31.6 millicuries of 6echnetium-OOm DTPA
aerosol inhalation and 3.3 millicuries of 6echnetium-OOm MAA IV

[Series 1: ant/post vent · 4.14mm/px · 1 of 1 slices shown (1 of 2)]
[im 1/1]
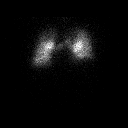

[Series 1: ant/post vent · 4.14mm/px · 1 of 1 slices shown (2 of 2)]
[im 1/1]
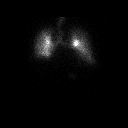

[Series 2: lao/rpo vent · 4.14mm/px · 1 of 1 slices shown (1 of 2)]
[im 1/1]
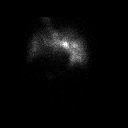

[Series 2: lao/rpo vent · 4.14mm/px · 1 of 1 slices shown (2 of 2)]
[im 1/1  full-range]
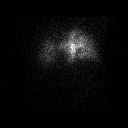

[Series 3: lpo/rao vent · 4.14mm/px · 1 of 1 slices shown (1 of 2)]
[im 1/1]
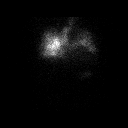

[Series 3: lpo/rao vent · 4.14mm/px · 1 of 1 slices shown (2 of 2)]
[im 1/1]
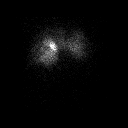

[Series 4: lt lat/rt lat vent · 4.14mm/px · 1 of 1 slices shown (1 of 2)]
[im 1/1]
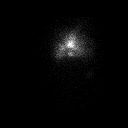

[Series 4: lt lat/rt lat vent · 4.14mm/px · 1 of 1 slices shown (2 of 2)]
[im 1/1]
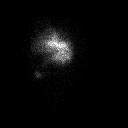

[Series 5: lt lat/rt lat perf · 4.14mm/px · 1 of 1 slices shown (1 of 2)]
[im 1/1]
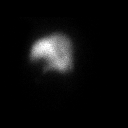

[Series 5: lt lat/rt lat perf · 4.14mm/px · 1 of 1 slices shown (2 of 2)]
[im 1/1]
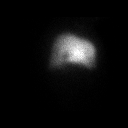

[Series 6: lpo/rao perf · 4.14mm/px · 1 of 1 slices shown (1 of 2)]
[im 1/1]
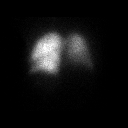

[Series 6: lpo/rao perf · 4.14mm/px · 1 of 1 slices shown (2 of 2)]
[im 1/1]
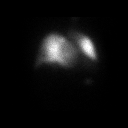

[Series 7: ant/post perf · 4.14mm/px · 1 of 1 slices shown]
[im 1/1]
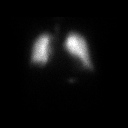

[Series 8: lao/rpo perf · 4.14mm/px · 1 of 1 slices shown (1 of 2)]
[im 1/1]
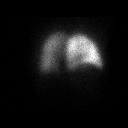

[Series 8: lao/rpo perf · 4.14mm/px · 1 of 1 slices shown (2 of 2)]
[im 1/1]
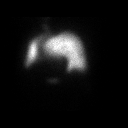

[15 of 15 positions shown; findings below may reference images not displayed]

FINDINGS: Ventilation: No focal ventilation defect.

Perfusion: No wedge shaped peripheral perfusion defects to suggest
acute pulmonary embolism.
IMPRESSION: Unremarkable lung ventilation perfusion study. No evidence of
pulmonary thromboembolism.

## 2017-01-16 IMAGING — CR DG CHEST 1V PORT
1 series · 1 of 1 positions shown · non-contrast
Comparison: 06/20/2014

CLINICAL DATA: Shortness of breath for 2 days

EXAM:
PORTABLE CHEST 1 VIEW

[AP]
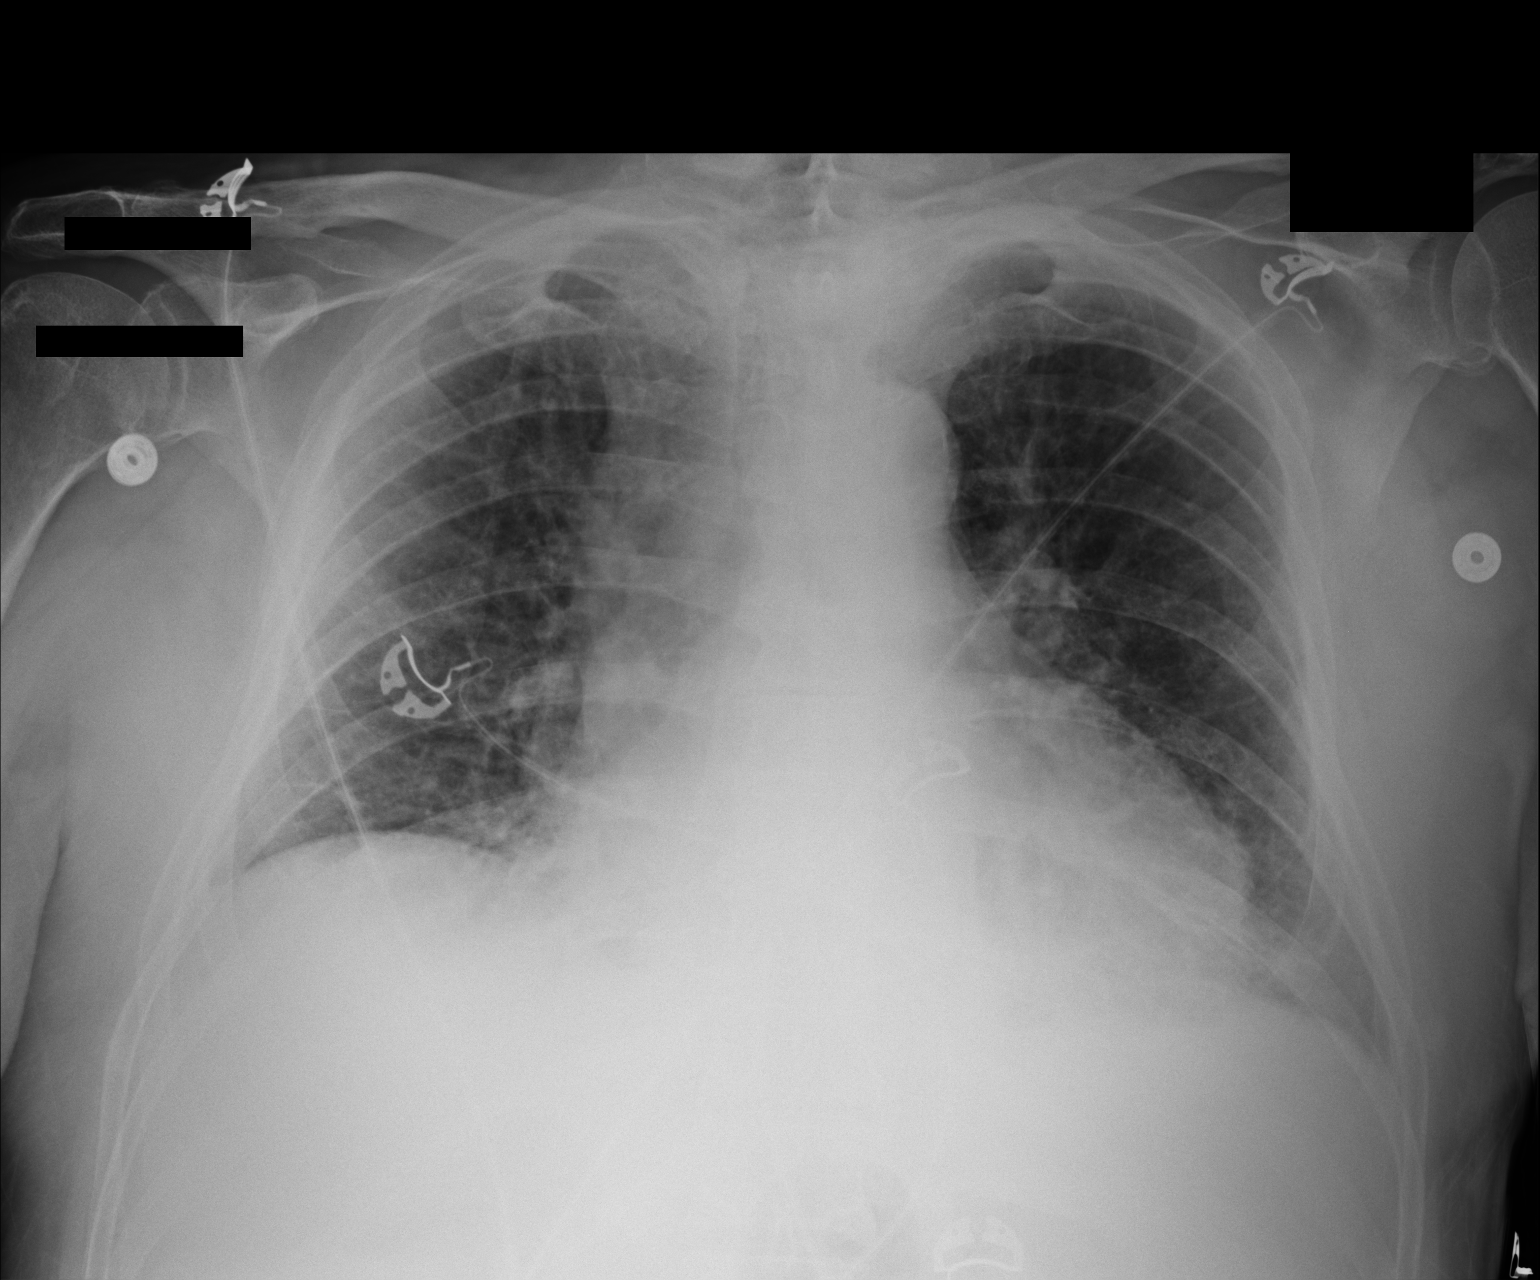

[1 of 1 positions shown; findings below may reference images not displayed]

FINDINGS: Mild cardiomegaly with vascular pedicle widening. Stable aortic
contours. Borderline pulmonary venous congestion. Chronic
hypoventilation with indistinct basilar opacities. No asymmetric
consolidation. No pneumothorax.
IMPRESSION: Chronic hypoventilation with bibasilar atelectasis or scarring.
Superimposed pneumonia could be obscured.

## 2017-01-30 ENCOUNTER — Encounter: Payer: Self-pay | Admitting: Emergency Medicine

## 2017-01-30 ENCOUNTER — Inpatient Hospital Stay
Admission: EM | Admit: 2017-01-30 | Discharge: 2017-02-01 | DRG: 384 | Disposition: A | Discharge status: Home / Self Care | Payer: Medicare Other | Attending: Internal Medicine | Admitting: Internal Medicine

## 2017-01-30 DIAGNOSIS — Z8673 Personal history of transient ischemic attack (TIA), and cerebral infarction without residual deficits: Secondary | ICD-10-CM

## 2017-01-30 DIAGNOSIS — E785 Hyperlipidemia, unspecified: Secondary | ICD-10-CM | POA: Diagnosis present

## 2017-01-30 DIAGNOSIS — Z23 Encounter for immunization: Secondary | ICD-10-CM | POA: Diagnosis not present

## 2017-01-30 DIAGNOSIS — D62 Acute posthemorrhagic anemia: Secondary | ICD-10-CM | POA: Diagnosis present

## 2017-01-30 DIAGNOSIS — Z888 Allergy status to other drugs, medicaments and biological substances status: Secondary | ICD-10-CM

## 2017-01-30 DIAGNOSIS — I2510 Atherosclerotic heart disease of native coronary artery without angina pectoris: Secondary | ICD-10-CM | POA: Diagnosis not present

## 2017-01-30 DIAGNOSIS — Z87891 Personal history of nicotine dependence: Secondary | ICD-10-CM

## 2017-01-30 DIAGNOSIS — R748 Abnormal levels of other serum enzymes: Secondary | ICD-10-CM | POA: Diagnosis not present

## 2017-01-30 DIAGNOSIS — K209 Esophagitis, unspecified: Secondary | ICD-10-CM | POA: Diagnosis not present

## 2017-01-30 DIAGNOSIS — K273 Acute peptic ulcer, site unspecified, without hemorrhage or perforation: Secondary | ICD-10-CM | POA: Diagnosis not present

## 2017-01-30 DIAGNOSIS — J209 Acute bronchitis, unspecified: Secondary | ICD-10-CM | POA: Diagnosis present

## 2017-01-30 DIAGNOSIS — K253 Acute gastric ulcer without hemorrhage or perforation: Secondary | ICD-10-CM | POA: Diagnosis not present

## 2017-01-30 DIAGNOSIS — F419 Anxiety disorder, unspecified: Secondary | ICD-10-CM | POA: Diagnosis not present

## 2017-01-30 DIAGNOSIS — I4891 Unspecified atrial fibrillation: Secondary | ICD-10-CM | POA: Diagnosis present

## 2017-01-30 DIAGNOSIS — K210 Gastro-esophageal reflux disease with esophagitis: Secondary | ICD-10-CM | POA: Diagnosis not present

## 2017-01-30 DIAGNOSIS — K921 Melena: Secondary | ICD-10-CM | POA: Diagnosis not present

## 2017-01-30 DIAGNOSIS — Z7901 Long term (current) use of anticoagulants: Secondary | ICD-10-CM

## 2017-01-30 DIAGNOSIS — K922 Gastrointestinal hemorrhage, unspecified: Secondary | ICD-10-CM | POA: Diagnosis not present

## 2017-01-30 DIAGNOSIS — K2211 Ulcer of esophagus with bleeding: Secondary | ICD-10-CM | POA: Diagnosis present

## 2017-01-30 DIAGNOSIS — I10 Essential (primary) hypertension: Secondary | ICD-10-CM | POA: Diagnosis present

## 2017-01-30 DIAGNOSIS — I509 Heart failure, unspecified: Secondary | ICD-10-CM | POA: Diagnosis not present

## 2017-01-30 DIAGNOSIS — Z79899 Other long term (current) drug therapy: Secondary | ICD-10-CM

## 2017-01-30 DIAGNOSIS — K259 Gastric ulcer, unspecified as acute or chronic, without hemorrhage or perforation: Secondary | ICD-10-CM | POA: Diagnosis not present

## 2017-01-30 DIAGNOSIS — I252 Old myocardial infarction: Secondary | ICD-10-CM | POA: Diagnosis not present

## 2017-01-30 DIAGNOSIS — Z955 Presence of coronary angioplasty implant and graft: Secondary | ICD-10-CM | POA: Diagnosis not present

## 2017-01-30 DIAGNOSIS — Z8546 Personal history of malignant neoplasm of prostate: Secondary | ICD-10-CM

## 2017-01-30 DIAGNOSIS — I959 Hypotension, unspecified: Secondary | ICD-10-CM | POA: Diagnosis not present

## 2017-01-30 DIAGNOSIS — K219 Gastro-esophageal reflux disease without esophagitis: Secondary | ICD-10-CM | POA: Diagnosis not present

## 2017-01-30 DIAGNOSIS — I110 Hypertensive heart disease with heart failure: Secondary | ICD-10-CM | POA: Diagnosis not present

## 2017-01-30 DIAGNOSIS — J45909 Unspecified asthma, uncomplicated: Secondary | ICD-10-CM | POA: Diagnosis not present

## 2017-01-30 LAB — ABO/RH: ABO/RH(D): O NEG

## 2017-01-30 LAB — COMPREHENSIVE METABOLIC PANEL
ALT: 11 U/L — ABNORMAL LOW (ref 17–63)
AST: 26 U/L (ref 15–41)
Albumin: 3.7 g/dL (ref 3.5–5.0)
Alkaline Phosphatase: 61 U/L (ref 38–126)
Anion gap: 12 (ref 5–15)
BUN: 44 mg/dL — ABNORMAL HIGH (ref 6–20)
CO2: 24 mmol/L (ref 22–32)
Calcium: 9.3 mg/dL (ref 8.9–10.3)
Chloride: 108 mmol/L (ref 101–111)
Creatinine, Ser: 1.31 mg/dL — ABNORMAL HIGH (ref 0.61–1.24)
GFR calc Af Amer: 56 mL/min — ABNORMAL LOW (ref >60)
GFR calc non Af Amer: 49 mL/min — ABNORMAL LOW (ref >60)
Glucose, Bld: 142 mg/dL — ABNORMAL HIGH (ref 65–99)
Potassium: 3.8 mmol/L (ref 3.5–5.1)
Sodium: 144 mmol/L (ref 135–145)
Total Bilirubin: 0.8 mg/dL (ref 0.3–1.2)
Total Protein: 7 g/dL (ref 6.5–8.1)

## 2017-01-30 LAB — CBC WITH DIFFERENTIAL/PLATELET
Basophils Absolute: 0 10*3/uL (ref 0–0.1)
Basophils Relative: 0 %
Eosinophils Absolute: 0 10*3/uL (ref 0–0.7)
Eosinophils Relative: 0 %
HCT: 37.7 % — ABNORMAL LOW (ref 40.0–52.0)
Hemoglobin: 12.3 g/dL — ABNORMAL LOW (ref 13.0–18.0)
Lymphocytes Relative: 7 %
Lymphs Abs: 1 10*3/uL (ref 1.0–3.6)
MCH: 30 pg (ref 26.0–34.0)
MCHC: 32.7 g/dL (ref 32.0–36.0)
MCV: 91.6 fL (ref 80.0–100.0)
Monocytes Absolute: 0.9 10*3/uL (ref 0.2–1.0)
Monocytes Relative: 7 %
Neutro Abs: 11 10*3/uL — ABNORMAL HIGH (ref 1.4–6.5)
Neutrophils Relative %: 86 %
Platelets: 213 10*3/uL (ref 150–440)
RBC: 4.11 MIL/uL — ABNORMAL LOW (ref 4.40–5.90)
RDW: 16 % — ABNORMAL HIGH (ref 11.5–14.5)
WBC: 12.9 10*3/uL — ABNORMAL HIGH (ref 3.8–10.6)

## 2017-01-30 LAB — PROTIME-INR
INR: 1.18
Prothrombin Time: 14.9 seconds (ref 11.4–15.2)

## 2017-01-30 LAB — MAGNESIUM: Magnesium: 1.6 mg/dL — ABNORMAL LOW (ref 1.7–2.4)

## 2017-01-30 LAB — TROPONIN I
Troponin I: 0.07 ng/mL (ref <0.03)
Troponin I: 0.06 ng/mL (ref <0.03)
Troponin I: 0.06 ng/mL (ref <0.03)

## 2017-01-30 LAB — HEMOGLOBIN: Hemoglobin: 12.9 g/dL — ABNORMAL LOW (ref 13.0–18.0)

## 2017-01-30 LAB — APTT: aPTT: 27 seconds (ref 24–36)

## 2017-01-30 LAB — LIPASE, BLOOD: Lipase: 24 U/L (ref 11–51)

## 2017-01-30 MED ORDER — SODIUM CHLORIDE 0.9 % IV SOLN
80.0000 mg | Freq: Once | INTRAVENOUS | Status: AC
  Administered 2017-01-30: 80 mg via INTRAVENOUS
  Filled 2017-01-30: qty 80

## 2017-01-30 MED ORDER — ALBUTEROL SULFATE (2.5 MG/3ML) 0.083% IN NEBU: 2.5000 mg | INHALATION_SOLUTION | RESPIRATORY_TRACT | Status: DC | PRN

## 2017-01-30 MED ORDER — PNEUMOCOCCAL VAC POLYVALENT 25 MCG/0.5ML IJ INJ
0.5000 mL | INJECTION | INTRAMUSCULAR | Status: AC
  Administered 2017-01-31: 0.5 mL via INTRAMUSCULAR
  Filled 2017-01-30: qty 0.5

## 2017-01-30 MED ORDER — SODIUM CHLORIDE 0.9 % IV SOLN
8.0000 mg/h | INTRAVENOUS | Status: DC
  Administered 2017-01-30 – 2017-01-31 (×3): 8 mg/h via INTRAVENOUS
  Filled 2017-01-30 (×3): qty 80

## 2017-01-30 MED ORDER — ONDANSETRON HCL 4 MG/2ML IJ SOLN: 4.0000 mg | Freq: Four times a day (QID) | INTRAMUSCULAR | Status: DC | PRN

## 2017-01-30 MED ORDER — SODIUM CHLORIDE 0.9% FLUSH
3.0000 mL | Freq: Two times a day (BID) | INTRAVENOUS | Status: DC
  Administered 2017-01-30 – 2017-02-01 (×4): 3 mL via INTRAVENOUS

## 2017-01-30 MED ORDER — PANTOPRAZOLE SODIUM 40 MG IV SOLR: 40.0000 mg | Freq: Two times a day (BID) | INTRAVENOUS | Status: DC

## 2017-01-30 MED ORDER — METHYLPREDNISOLONE SODIUM SUCC 125 MG IJ SOLR
60.0000 mg | INTRAMUSCULAR | Status: DC
  Administered 2017-01-30 – 2017-02-01 (×3): 60 mg via INTRAVENOUS
  Filled 2017-01-30 (×3): qty 2

## 2017-01-30 MED ORDER — IPRATROPIUM-ALBUTEROL 0.5-2.5 (3) MG/3ML IN SOLN
3.0000 mL | Freq: Four times a day (QID) | RESPIRATORY_TRACT | Status: DC
  Administered 2017-01-30: 3 mL via RESPIRATORY_TRACT
  Filled 2017-01-30 (×4): qty 3

## 2017-01-30 MED ORDER — POLYETHYLENE GLYCOL 3350 17 G PO PACK: 17.0000 g | PACK | Freq: Every day | ORAL | Status: DC | PRN

## 2017-01-30 MED ORDER — GI COCKTAIL ~~LOC~~: 30.0000 mL | Freq: Once | ORAL | Status: DC

## 2017-01-30 MED ORDER — SODIUM CHLORIDE 0.9 % IV SOLN
10.0000 mL/h | Freq: Once | INTRAVENOUS | Status: AC
  Administered 2017-01-30: 10 mL/h via INTRAVENOUS

## 2017-01-30 MED ORDER — ACETAMINOPHEN 325 MG PO TABS: 650.0000 mg | ORAL_TABLET | Freq: Four times a day (QID) | ORAL | Status: DC | PRN

## 2017-01-30 MED ORDER — MORPHINE SULFATE (PF) 2 MG/ML IV SOLN: 1.0000 mg | INTRAVENOUS | Status: DC | PRN

## 2017-01-30 MED ORDER — GI COCKTAIL ~~LOC~~
ORAL | Status: AC
  Filled 2017-01-30: qty 30

## 2017-01-30 MED ORDER — INFLUENZA VAC SPLIT HIGH-DOSE 0.5 ML IM SUSY
0.5000 mL | PREFILLED_SYRINGE | INTRAMUSCULAR | Status: DC
  Administered 2017-01-31: 0.5 mL via INTRAMUSCULAR
  Filled 2017-01-30 (×2): qty 0.5

## 2017-01-30 MED ORDER — ACETAMINOPHEN 650 MG RE SUPP: 650.0000 mg | Freq: Four times a day (QID) | RECTAL | Status: DC | PRN

## 2017-01-30 MED ORDER — ONDANSETRON HCL 4 MG PO TABS: 4.0000 mg | ORAL_TABLET | Freq: Four times a day (QID) | ORAL | Status: DC | PRN

## 2017-01-30 MED ORDER — SODIUM CHLORIDE 0.9 % IV BOLUS (SEPSIS)
500.0000 mL | Freq: Once | INTRAVENOUS | Status: AC
  Administered 2017-01-30: 500 mL via INTRAVENOUS

## 2017-01-30 NOTE — Progress Notes (Signed)
2 units PRBC ordered in ED. Patient received 1 unit. Will hold second unit for now and check Hb

## 2017-01-30 NOTE — ED Notes (Signed)
Dr. Darvin Neighbours notified that patient's HR variable with frequent multifocal PVC's. Pt also to begin to complain of a "heartburn" feeling beginning to return, MD notified of that as well. No new orders received, per MD okay to continue to continue to transfusion.

## 2017-01-30 NOTE — ED Triage Notes (Addendum)
Patient presents to the ED with burning chest pain that started yesterday evening.  Patient reports it is in the center of his chest and in his throat.  Patient was sent from [facility] and his blood pressure was 80/50 at the clinic.  Patient denies stomach pain.  Patient reports black stool x 2 days.  Patient denies history of GI bleed.

## 2017-01-30 NOTE — Consult Note (Signed)
Lucilla Lame, MD [address] [place], [place] [address] Phone: [phone] Fax : [phone]  Consultation  Referring Provider:     Dr, Darvin Neighbours Primary Care Physician:  Venia Carbon, MD Primary Gastroenterologist:  Althia Forts         Reason for Consultation:     Melena  Date of Admission:  01/30/2017 Date of Consultation:  01/30/2017         HPI:   Kyle [name] is a 83 y.o. male Who reports that he was having black stools over the last 2 days with some mixed fresh blood.  The patient takes Eliquis for his A. Fib.  The patient denies nausea vomiting fevers or chills. Patient also reports that he has heartburn when he lays down.The patient also has a history of a stroke CVAs and hypertension.  The patient was in his PCPs office and was found to have decreased blood pressure and patient was admitted to the hospital.  Patient was transfused with one unit of blood despite his hemoglobin being 12.3.  The patient's repeat hemoglobin was 12.9.  The patient is not having any abdominal pain at this time but states he has some burning chest pain.  Past Medical History:  Diagnosis Date  . Arthritis   . Cancer [facility])    prostate CA  . Claustrophobia    "EXTREMELY" (03/02/2015)  . Detached retina    right  . GERD (gastroesophageal reflux disease)   . H/O dizziness   . History of prostate cancer   . Hyperlipidemia   . Hypertension   . Impaired fasting glucose   . Stroke [facility]) ~ 1995-02/2009 X 2   /notes 08/22/2010  . Urgency of urination     Past Surgical History:  Procedure Laterality Date  . CARDIAC CATHETERIZATION  ~ 1995   Archie Endo 08/22/2010  . CARDIAC CATHETERIZATION N/A 03/02/2015   Procedure: Left Heart Cath and Coronary Angiography;  Surgeon: Jettie Booze, MD;  Location: [place] CV LAB;  Service: Cardiovascular;  Laterality: N/A;  . CARDIAC CATHETERIZATION  03/02/2015   Procedure: Coronary Stent Intervention;  Surgeon: Jettie Booze, MD;   Location: [place] CV LAB;  Service: Cardiovascular;;  . COLONOSCOPY W/ POLYPECTOMY    . EYE SURGERY Right   . HERNIA REPAIR     hx UHR/notes 08/22/2010  . INGUINAL HERNIA REPAIR Left    Archie Endo 08/22/2010  . LUMBAR LAMINECTOMY/DECOMPRESSION MICRODISCECTOMY  01/24/2012   Procedure: LUMBAR LAMINECTOMY/DECOMPRESSION MICRODISCECTOMY 1 LEVEL;  Surgeon: Winfield Cunas, MD;  Location: [place] [facility] ORS;  Service: Neurosurgery;  Laterality: Right;  RIGHT Lumbar Three-Four far lateral diskectomy  . [place]   Archie Endo 08/22/2010  . RETINAL DETACHMENT REPAIR W/ SCLERAL BUCKLE LE Right 08/2006   Archie Endo 08/22/2010  . TONSILLECTOMY    . UMBILICAL HERNIA REPAIR     hx/notes 08/22/2010    Prior to Admission medications   Medication Sig Start Date End Date Taking? Authorizing Provider  apixaban (ELIQUIS) 2.5 MG TABS tablet Take 1 tablet (2.5 mg total) by mouth 2 (two) times daily. 05/02/16  Yes Minus Breeding, MD  atorvastatin (LIPITOR) 80 MG tablet TAKE ONE TABLET EVERY DAY AT 6PM 12/12/16  Yes Minus Breeding, MD  furosemide (LASIX) 20 MG tablet Take 1 tablet (20 mg total) by mouth daily. 10/31/16  Yes Minus Breeding, MD  losartan (COZAAR) 50 MG tablet Take 1 tablet (50 mg total) by mouth daily. 04/18/16  Yes Minus Breeding, MD  traMADol (ULTRAM) 50 MG tablet TAKE 1 TABLET BY MOUTH 3 TIMES DAILY AS NEEDED 12/25/16  Yes Venia Carbon, MD    Family History  Problem Relation Age of Onset  . Heart disease Mother   . Coronary artery disease Mother   . Heart disease Father   . Coronary artery disease Father   . Heart disease Brother   . Heart disease Brother   . Cancer Brother        prostate  . Diabetes Neg Hx      Social History  Substance Use Topics  . Smoking status: Never Smoker  . Smokeless tobacco: Former Systems developer    Types: Chew    Quit date: 04/22/2004  . Alcohol use No    Allergies as of 01/30/2017 - Review Complete 01/30/2017  Allergen Reaction Noted  . Simvastatin      Review  of Systems:    All systems reviewed and negative except where noted in HPI.   Physical Exam:  Vital signs in last 24 hours: Temp:  [97.6 F (36.4 C)-98.6 F (37 C)] 98.6 F (37 C) (10/11 1932) Pulse Rate:  [35-86] 77 (10/11 1932) Resp:  [16-29] 17 (10/11 1932) BP: (110-148)/(57-88) 120/62 (10/11 1932) SpO2:  [94 %-100 %] 95 % (10/11 1932) Weight:  [185 lb (83.9 kg)] 185 lb (83.9 kg) (10/11 0931) Last BM Date: 01/29/17 General:   Pleasant, cooperative in NAD Head:  Normocephalic and atraumatic. Eyes:   No icterus.   Conjunctiva pink. PERRLA. Ears:  Normal auditory acuity. Neck:  Supple; no masses or thyroidomegaly Lungs: Respirations even and unlabored. Lungs clear to auscultation bilaterally.   No wheezes, crackles, or rhonchi.  Heart:  Regular rate and rhythm;  Without murmur, clicks, rubs or gallops Abdomen:  Soft, nondistended, nontender. Normal bowel sounds. No appreciable masses or hepatomegaly.  No rebound or guarding.  Rectal:  Not performed. Msk:  Symmetrical without gross deformities.    Extremities:  Without edema, cyanosis or clubbing. Neurologic:  Alert and oriented x3;  grossly normal neurologically. Skin:  Intact without significant lesions or rashes. Cervical Nodes:  No significant cervical adenopathy. Psych:  Alert and cooperative. Normal affect.  LAB RESULTS:  Recent Labs  01/30/17 0946 01/30/17 1553  WBC 12.9*  --   HGB 12.3* 12.9*  HCT 37.7*  --   PLT 213  --    BMET  Recent Labs  01/30/17 0946  NA 144  K 3.8  CL 108  CO2 24  GLUCOSE 142*  BUN 44*  CREATININE 1.31*  CALCIUM 9.3   LFT  Recent Labs  01/30/17 0946  PROT 7.0  ALBUMIN 3.7  AST 26  ALT 11*  ALKPHOS 61  BILITOT 0.8   PT/INR  Recent Labs  01/30/17 0946  LABPROT 14.9  INR 1.18    STUDIES: No results found.    Impression / Plan:   Kyle [name] is a 83 y.o. y/o male with Black stools and bloody stools.  The patient has a stable hemoglobin but was admitted  with decreased blood pressure.  The patient will be set up for an EGD for tomorrow.I have discussed risks & benefits which include, but are not limited to, bleeding, infection, perforation & drug reaction.  The patient agrees with this plan & written consent will be obtained.      Thank you for involving me in the care of this patient.      LOS: 0 days   Lucilla Lame, MD  01/30/2017, 7:58  PM   Note: This dictation was prepared with Dragon dictation along with smaller phrase technology. Any transcriptional errors that result from this process are unintentional.

## 2017-01-30 NOTE — Progress Notes (Signed)
First unit of PRBCs finished. Md notified. Patient's last Hgb 12.9 ORder to recheck hemoglobin and hold second unit of PRBCs.

## 2017-01-30 NOTE — ED Notes (Signed)
Pt denies any pain at this time, pt states "No I just don't feel good". Pt noted to continue to be have tachypnea as he did prior to starting the transfusion, VSS at this time. Pt noted to have multifocal PVC's. No change in neuro status at this time. Pt readjusted in the bed by Paulette, RN for patient comfort. Apologized for and explained delay in patient going upstairs. Will continue to monitor for further patient needs.

## 2017-01-30 NOTE — ED Notes (Signed)
Date and time results received: 01/30/17  1038   Test: Troponin Critical Value: 0.07  Name of Provider Notified: Dr. Burlene Arnt  Orders Received? Or Actions Taken?: Critical Results Acknowledged

## 2017-01-30 NOTE — ED Notes (Signed)
Pt's wife to desk, notified this RN that patient is having "heartburn spells and is just a shaking". This RN to bedside, pt is noted to be resting in bed, no tremors noted at this time. Pt's son also reports "firey hiccupping spells", pt repositioned by this RN for comfort. Dr. Darvin Neighbours notified and came to bedside to assess patient. Per Dr. Darvin Neighbours, okay to continue transfusion, and "pt looks great".

## 2017-01-30 NOTE — ED Provider Notes (Addendum)
[facility] Emergency Department Provider Note  ____________________________________________   I have reviewed the triage vital signs and the nursing notes.   HISTORY  Chief Complaint Chest Pain    HPI Kyle [name] is a 83 y.o. male with no history of GI bleed that he has about does have a history ofcardiac disease and A. fib on anticoagulation states he's had a burning, not obtained any burning sensation in his stomach going up towards his throat since last night which is been significant. He states he last took his Eliquis at 5 PM last night. he has had dark black stool, over the last 2 days with possible blood mixed in but mostly black. This was mentioned only on review of systems, he did not initially specify this. He denies any abdominal pain. He has no fever or chills. He did not have any pain in his chest is similar to prior ACS. He has no exertional pain. Does not have any food-related discomfort. Nothing makes the pain better and nothing makes it worse except for lying flat. As a burning discomfort. He cannot tell me how may black stool he has had but "a few". No hematemesis    Past Medical History:  Diagnosis Date  . Arthritis   . Cancer [facility])    prostate CA  . Claustrophobia    "EXTREMELY" (03/02/2015)  . Detached retina    right  . GERD (gastroesophageal reflux disease)   . H/O dizziness   . History of prostate cancer   . Hyperlipidemia   . Hypertension   . Impaired fasting glucose   . Stroke [facility]) ~ 1995-02/2009 X 2   /notes 08/22/2010  . Urgency of urination     Patient Active Problem List   Diagnosis Date Noted  . Fatigue 10/26/2016  . H/O malignant neoplasm of prostate 07/24/2015  . Hypercholesterolemia 07/24/2015  . 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 Pseudoaphakia 07/24/2015  . Retinal detachment with break 07/24/2015  . Non-STEMI (non-ST elevated myocardial infarction) ([place])   . Stented coronary artery   . NSVT  (nonsustained ventricular tachycardia) ([place])   . Coronary artery disease involving native coronary artery of native heart with angina pectoris ([place])   . Ischemic cardiomyopathy   . Paroxysmal atrial fibrillation (HCC)   . Chronic systolic congestive heart failure, NYHA class 2 ([place]) 02/27/2015  . CKD (chronic kidney disease), stage III ([place]) 02/27/2015  . Advanced directives, counseling/discussion 04/20/2014  . Central retinal edema, cystoid 07/28/2013  . Mild cognitive impairment 04/02/2011  . Routine general medical examination at a health care facility 10/10/2010  . Hyperlipemia 09/17/2006  . Essential hypertension, benign 09/17/2006  . Osteoarthritis, multiple sites 09/17/2006    Past Surgical History:  Procedure Laterality Date  . CARDIAC CATHETERIZATION  ~ 1995   Archie Endo 08/22/2010  . CARDIAC CATHETERIZATION N/A 03/02/2015   Procedure: Left Heart Cath and Coronary Angiography;  Surgeon: Jettie Booze, MD;  Location: [place] CV LAB;  Service: Cardiovascular;  Laterality: N/A;  . CARDIAC CATHETERIZATION  03/02/2015   Procedure: Coronary Stent Intervention;  Surgeon: Jettie Booze, MD;  Location: [place] CV LAB;  Service: Cardiovascular;;  . COLONOSCOPY W/ POLYPECTOMY    . EYE SURGERY Right   . HERNIA REPAIR     hx UHR/notes 08/22/2010  . INGUINAL HERNIA REPAIR Left    Archie Endo 08/22/2010  . LUMBAR LAMINECTOMY/DECOMPRESSION MICRODISCECTOMY  01/24/2012   Procedure: LUMBAR LAMINECTOMY/DECOMPRESSION MICRODISCECTOMY 1 LEVEL;  Surgeon: Winfield Cunas, MD;  Location: [place]  ORS;  Service: Neurosurgery;  Laterality: Right;  RIGHT Lumbar Three-Four far lateral diskectomy  . [place]   Archie Endo 08/22/2010  . RETINAL DETACHMENT REPAIR W/ SCLERAL BUCKLE LE Right 08/2006   Archie Endo 08/22/2010  . TONSILLECTOMY    . UMBILICAL HERNIA REPAIR     hx/notes 08/22/2010    Prior to Admission medications   Medication Sig Start Date End Date Taking? Authorizing Provider  apixaban  (ELIQUIS) 2.5 MG TABS tablet Take 1 tablet (2.5 mg total) by mouth 2 (two) times daily. 05/02/16   Minus Breeding, MD  atorvastatin (LIPITOR) 80 MG tablet TAKE ONE TABLET EVERY DAY AT 6PM 12/12/16   Minus Breeding, MD  furosemide (LASIX) 20 MG tablet Take 1 tablet (20 mg total) by mouth daily. 10/31/16   Minus Breeding, MD  losartan (COZAAR) 50 MG tablet Take 1 tablet (50 mg total) by mouth daily. 04/18/16   Minus Breeding, MD  traMADol (ULTRAM) 50 MG tablet TAKE 1 TABLET BY MOUTH 3 TIMES DAILY AS NEEDED 12/25/16   Venia Carbon, MD    Allergies Simvastatin  Family History  Problem Relation Age of Onset  . Heart disease Mother   . Coronary artery disease Mother   . Heart disease Father   . Coronary artery disease Father   . Heart disease Brother   . Heart disease Brother   . Cancer Brother        prostate  . Diabetes Neg Hx     Social History Social History  Substance Use Topics  . Smoking status: Never Smoker  . Smokeless tobacco: Former Systems developer    Types: Chew    Quit date: 04/22/2004  . Alcohol use No    Review of Systems Constitutional: No fever/chills Eyes: No visual changes. ENT: No sore throat. No stiff neck no neck pain Cardiovascular: Denies chest pain. Respiratory: Denies shortness of breath. Gastrointestinal:   no vomiting.  No diarrhea.  No constipation. Genitourinary: Negative for dysuria. Musculoskeletal: Negative lower extremity swelling Skin: Negative for rash. Neurological: Negative for severe headaches, focal weakness or numbness.   ____________________________________________   PHYSICAL EXAM:  VITAL SIGNS: ED Triage Vitals  Enc Vitals Group     BP 01/30/17 0930 (!) 126/57     Pulse Rate 01/30/17 0930 79     Resp 01/30/17 0930 (!) 30     Temp 01/30/17 0930 98.1 F (36.7 C)     Temp Source 01/30/17 0930 Oral     SpO2 01/30/17 0930 100 %     Weight 01/30/17 0931 185 lb (83.9 kg)     Height 01/30/17 0931 6' (1.829 m)     Head Circumference --       Peak Flow --      Pain Score 01/30/17 0929 8     Pain Loc --      Pain Edu? --      Excl. in [place]? --     Constitutional: Alert and oriented. appearsanxious but not otherwise toxic Eyes: Conjunctivae are normal Head: Atraumatic HEENT: No congestion/rhinnorhea. Mucous membranes are moist.  Oropharynx non-erythematous Neck:   Nontender with no meningismus, no masses, no stridor Cardiovascular: Normal rate, regular rhythm. Grossly normal heart sounds.  Good peripheral circulation. Respiratory: Normal respiratory effort.  No retractions. Lungs CTAB. Abdominal: Soft and nontender. No distention. No guarding no rebound Back:  There is no focal tenderness or step off.  there is no midline tenderness there are no lesions noted. there is no CVA tenderness rectal  exam: Guaiac positive black stool noted. Musculoskeletal: No lower extremity tenderness, no upper extremity tenderness. No joint effusions, no DVT signs strong distal pulses no edema Neurologic:  Normal speech and language. No gross focal neurologic deficits are appreciated.  Skin:  Skin is warm, dry and intact. No rash noted. Psychiatric: Mood and affect are normal. Speech and behavior are normal.  ____________________________________________   LABS (all labs ordered are listed, but only abnormal results are displayed)  Labs Reviewed  PROTIME-INR  CBC WITH DIFFERENTIAL/PLATELET  COMPREHENSIVE METABOLIC PANEL  LIPASE, BLOOD  TROPONIN I  APTT  MAGNESIUM  TYPE AND SCREEN  PREPARE RBC (CROSSMATCH)    Pertinent labs  results that were available during my care of the patient were reviewed by me and considered in my medical decision making (see chart for details). ____________________________________________  EKG  I personally interpreted any EKGs ordered by me or triage 2 EKGs were performed, the secondshows multiple PVCs, sinus rhythm, rate 70, ST changes consistent with prior, no acute ST elevation, V1 and V2  configurations consistent with prior EKG. Another EKG performedshows multiple PVCs, LVH, RBBBLAFB and again same changes noted as before on prior EKGs ____________________________________________  RADIOLOGY  Pertinent labs & imaging results that were available during my care of the patient were reviewed by me and considered in my medical decision making (see chart for details). If possible, patient and/or family made aware of any abnormal findings. ____________________________________________    PROCEDURES  Procedure(s) performed: None  Procedures  Critical Care performed: CRITICAL CARE Performed by: Schuyler Amor   Total critical care time: 45 minutes  Critical care time was exclusive of separately billable procedures and treating other patients.  Critical care was necessary to treat or prevent imminent or life-threatening deterioration.  Critical care was time spent personally by me on the following activities: development of treatment plan with patient and/or surrogate as well as nursing, discussions with consultants, evaluation of patient's response to treatment, examination of patient, obtaining history from patient or surrogate, ordering and performing treatments and interventions, ordering and review of laboratory studies, ordering and review of radiographic studies, pulse oximetry and re-evaluation of patient's condition.   ____________________________________________   INITIAL IMPRESSION / ASSESSMENT AND PLAN / ED COURSE  Pertinent labs & imaging results that were available during my care of the patient were reviewed by me and considered in my medical decision making (see chart for details).  patient here with GI bleed, he is anticoagulated. Pressures are okay here but has a wide pulse pressure and was hypotensive prior to arrival per report. Very concerning presentation. I explained the risks benefits and alternative of blood administration and he agrees to  transfusion. This is pending. CBC is pending patient has 2 IVs protonic strip has been ordered, we will get him admitted to the ICU, of note, nothing at this time indicate that the "burning" in his chest as ACS, patient would not do well in the Cath Lab even if it were given ongoing GI bleed, his abdomen is benign with no evidence of perforated viscus,    ____________________________________________   FINAL CLINICAL IMPRESSION(S) / ED DIAGNOSES  Final diagnoses:  Gastrointestinal hemorrhage, unspecified gastrointestinal hemorrhage type      This chart was dictated using voice recognition software.  Despite best efforts to proofread,  errors can occur which can change meaning.      Schuyler Amor, MD 01/30/17 1010    Schuyler Amor, MD 01/30/17 1010

## 2017-01-30 NOTE — ED Notes (Signed)
MD at bedside at this time to discuss risks/benefits of blood transfusion. Pt states last dose of elequis at 01/29/17 1700.

## 2017-01-30 NOTE — ED Notes (Signed)
Admitting MD at bedside at this time.

## 2017-01-30 NOTE — H&P (Signed)
[place] [place] NAME: Kyle [name]    MR#:  [id number]  DATE OF BIRTH:  January 31, 1932  DATE OF ADMISSION:  01/30/2017  PRIMARY CARE PHYSICIAN: Venia Carbon, MD   REQUESTING/REFERRING PHYSICIAN: Dr. Burlene Arnt  CHIEF COMPLAINT:   Chief Complaint  Patient presents with  . Chest Pain    HISTORY OF PRESENT ILLNESS:  Kyle [name]  is a 83 y.o. male with a known history of CVA on Eliquis, hypertension, stroke presents to the emergency room from his primary care physician's office due to 2 days of black stools and blood in stool. Patient feels dizzy, weak some shortness of breath. Stool positive for blood. Hemoglobin 12.3. Was hypotensive at his doctor's office. He does take [place] powder daily.  PAST MEDICAL HISTORY:   Past Medical History:  Diagnosis Date  . Arthritis   . Cancer [facility])    prostate CA  . Claustrophobia    "EXTREMELY" (03/02/2015)  . Detached retina    right  . GERD (gastroesophageal reflux disease)   . H/O dizziness   . History of prostate cancer   . Hyperlipidemia   . Hypertension   . Impaired fasting glucose   . Stroke [facility]) ~ 1995-02/2009 X 2   /notes 08/22/2010  . Urgency of urination     PAST SURGICAL HISTORY:   Past Surgical History:  Procedure Laterality Date  . CARDIAC CATHETERIZATION  ~ 1995   Archie Endo 08/22/2010  . CARDIAC CATHETERIZATION N/A 03/02/2015   Procedure: Left Heart Cath and Coronary Angiography;  Surgeon: Jettie Booze, MD;  Location: [place] CV LAB;  Service: Cardiovascular;  Laterality: N/A;  . CARDIAC CATHETERIZATION  03/02/2015   Procedure: Coronary Stent Intervention;  Surgeon: Jettie Booze, MD;  Location: [place] CV LAB;  Service: Cardiovascular;;  . COLONOSCOPY W/ POLYPECTOMY    . EYE SURGERY Right   . HERNIA REPAIR     hx UHR/notes 08/22/2010  . INGUINAL HERNIA REPAIR Left    Archie Endo 08/22/2010  . LUMBAR LAMINECTOMY/DECOMPRESSION MICRODISCECTOMY  01/24/2012    Procedure: LUMBAR LAMINECTOMY/DECOMPRESSION MICRODISCECTOMY 1 LEVEL;  Surgeon: Winfield Cunas, MD;  Location: [place] NEURO ORS;  Service: Neurosurgery;  Laterality: Right;  RIGHT Lumbar Three-Four far lateral diskectomy  . [place]   Archie Endo 08/22/2010  . RETINAL DETACHMENT REPAIR W/ SCLERAL BUCKLE LE Right 08/2006   Archie Endo 08/22/2010  . TONSILLECTOMY    . UMBILICAL HERNIA REPAIR     hx/notes 08/22/2010    SOCIAL HISTORY:   Social History  Substance Use Topics  . Smoking status: Never Smoker  . Smokeless tobacco: Former Systems developer    Types: Chew    Quit date: 04/22/2004  . Alcohol use No    FAMILY HISTORY:   Family History  Problem Relation Age of Onset  . Heart disease Mother   . Coronary artery disease Mother   . Heart disease Father   . Coronary artery disease Father   . Heart disease Brother   . Heart disease Brother   . Cancer Brother        prostate  . Diabetes Neg Hx     DRUG ALLERGIES:   Allergies  Allergen Reactions  . Simvastatin     REACTION: myalgias    REVIEW OF SYSTEMS:   Review of Systems  Constitutional: Positive for malaise/fatigue. Negative for chills and fever.  HENT: Negative for sore throat.   Eyes: Negative for blurred vision, double vision and pain.  Respiratory: Negative for cough, hemoptysis, shortness of breath and wheezing.   Cardiovascular: Negative for chest pain, palpitations, orthopnea and leg swelling.  Gastrointestinal: Positive for abdominal pain, blood in stool, melena and nausea. Negative for constipation, diarrhea, heartburn and vomiting.  Genitourinary: Negative for dysuria and hematuria.  Musculoskeletal: Negative for back pain and joint pain.  Skin: Negative for rash.  Neurological: Positive for weakness. Negative for sensory change, speech change, focal weakness and headaches.  Endo/Heme/Allergies: Does not bruise/bleed easily.  Psychiatric/Behavioral: Negative for depression. The patient is not nervous/anxious.      MEDICATIONS AT HOME:   Prior to Admission medications   Medication Sig Start Date End Date Taking? Authorizing Provider  apixaban (ELIQUIS) 2.5 MG TABS tablet Take 1 tablet (2.5 mg total) by mouth 2 (two) times daily. 05/02/16  Yes Minus Breeding, MD  atorvastatin (LIPITOR) 80 MG tablet TAKE ONE TABLET EVERY DAY AT 6PM 12/12/16  Yes Minus Breeding, MD  furosemide (LASIX) 20 MG tablet Take 1 tablet (20 mg total) by mouth daily. 10/31/16  Yes Minus Breeding, MD  losartan (COZAAR) 50 MG tablet Take 1 tablet (50 mg total) by mouth daily. 04/18/16  Yes Hochrein, [name], MD  traMADol (ULTRAM) 50 MG tablet TAKE 1 TABLET BY MOUTH 3 TIMES DAILY AS NEEDED 12/25/16  Yes Venia Carbon, MD     VITAL SIGNS:  Blood pressure 139/76, pulse 70, temperature 98.4 F (36.9 C), temperature source Oral, resp. rate (!) 28, height 6' (1.829 m), weight 83.9 kg (185 lb), SpO2 97 %.  PHYSICAL EXAMINATION:  Physical Exam  GENERAL:  83 y.o.-year-old patient lying in the bed  EYES: Pupils equal, round, reactive to light and accommodation. No scleral icterus. Extraocular muscles intact.  HEENT: Head atraumatic, normocephalic. Oropharynx and nasopharynx clear. No oropharyngeal erythema, moist oral mucosa  NECK:  Supple, no jugular venous distention. No thyroid enlargement, no tenderness.  LUNGS: Normal breath sounds bilaterally, no wheezing, rales, rhonchi. No use of accessory muscles of respiration.  CARDIOVASCULAR: S1, S2 normal. No murmurs, rubs, or gallops.  ABDOMEN: Soft, nontender, nondistended. Bowel sounds present. No organomegaly or mass.  EXTREMITIES: No pedal edema, cyanosis, or clubbing. + 2 pedal & radial pulses b/l.   NEUROLOGIC: Cranial nerves II through XII are intact. No focal Motor or sensory deficits appreciated b/l PSYCHIATRIC: The patient is alert and oriented x 3.  SKIN: No obvious rash, lesion, or ulcer.   LABORATORY PANEL:   CBC  Recent Labs Lab 01/30/17 0946  WBC 12.9*  HGB 12.3*   HCT 37.7*  PLT 213   ------------------------------------------------------------------------------------------------------------------  Chemistries   Recent Labs Lab 01/30/17 0946  NA 144  K 3.8  CL 108  CO2 24  GLUCOSE 142*  BUN 44*  CREATININE 1.31*  CALCIUM 9.3  MG 1.6*  AST 26  ALT 11*  ALKPHOS 61  BILITOT 0.8   ------------------------------------------------------------------------------------------------------------------  Cardiac Enzymes  Recent Labs Lab 01/30/17 0946  TROPONINI 0.07*   ------------------------------------------------------------------------------------------------------------------  RADIOLOGY:  No results found.   IMPRESSION AND PLAN:   * Melena with upper GI bleed. Likely peptic ulcer disease due to [place] powders Hypotensive Bolus NS 500 ml STAT. Blood ordered in ED Repeat Hb in the evening Hold anticoagulation List with Dr. wall of GI. Patient is nothing by mouth. EGD later today.  * Acute bronchitis with mild wheezing. We'll start IV steroids and nebulizers. Not needing oxygen. No respiratory distress.  * Mild elevation troponin is chronic. Lower than in the past. No chest pain.  *  DVT prophylaxis with SCDs  All the records are reviewed and case discussed with ED provider. Management plans discussed with the patient, family and they are in agreement.  CODE STATUS: FULL CODE  TOTAL CC TIME TAKING CARE OF THIS PATIENT: 40 minutes.   Hillary Bow R M.D on 01/30/2017 at 12:28 PM  Between 7am to 6pm - Pager - [phone]  After 6pm go to [email or web address] - password [email or web address] [facility]  Office  [phone]  CC: Primary care physician; Venia Carbon, MD  Note: This dictation was prepared with Dragon dictation along with smaller phrase technology. Any transcriptional errors that result from this process are unintentional.

## 2017-01-31 ENCOUNTER — Encounter
Admission: EM | Disposition: A | Discharge status: Home / Self Care | Payer: Self-pay | Source: Home / Self Care | Attending: Internal Medicine

## 2017-01-31 ENCOUNTER — Inpatient Hospital Stay: Payer: Medicare Other | Admitting: Anesthesiology

## 2017-01-31 DIAGNOSIS — K210 Gastro-esophageal reflux disease with esophagitis: Secondary | ICD-10-CM

## 2017-01-31 DIAGNOSIS — K253 Acute gastric ulcer without hemorrhage or perforation: Secondary | ICD-10-CM

## 2017-01-31 DIAGNOSIS — K922 Gastrointestinal hemorrhage, unspecified: Secondary | ICD-10-CM

## 2017-01-31 HISTORY — PX: ESOPHAGOGASTRODUODENOSCOPY (EGD) WITH PROPOFOL: SHX5813

## 2017-01-31 LAB — CBC
HCT: 35.1 % — ABNORMAL LOW (ref 40.0–52.0)
Hemoglobin: 12 g/dL — ABNORMAL LOW (ref 13.0–18.0)
MCH: 30.8 pg (ref 26.0–34.0)
MCHC: 34.1 g/dL (ref 32.0–36.0)
MCV: 90.3 fL (ref 80.0–100.0)
Platelets: 180 10*3/uL (ref 150–440)
RBC: 3.89 MIL/uL — ABNORMAL LOW (ref 4.40–5.90)
RDW: 15.8 % — ABNORMAL HIGH (ref 11.5–14.5)
WBC: 12.6 10*3/uL — ABNORMAL HIGH (ref 3.8–10.6)

## 2017-01-31 LAB — BASIC METABOLIC PANEL
Anion gap: 5 (ref 5–15)
BUN: 36 mg/dL — ABNORMAL HIGH (ref 6–20)
CO2: 24 mmol/L (ref 22–32)
Calcium: 8.6 mg/dL — ABNORMAL LOW (ref 8.9–10.3)
Chloride: 116 mmol/L — ABNORMAL HIGH (ref 101–111)
Creatinine, Ser: 1.11 mg/dL (ref 0.61–1.24)
GFR calc Af Amer: >60 mL/min (ref >60)
GFR calc non Af Amer: 59 mL/min — ABNORMAL LOW (ref >60)
Glucose, Bld: 129 mg/dL — ABNORMAL HIGH (ref 65–99)
Potassium: 3.3 mmol/L — ABNORMAL LOW (ref 3.5–5.1)
Sodium: 145 mmol/L (ref 135–145)

## 2017-01-31 SURGERY — ESOPHAGOGASTRODUODENOSCOPY (EGD) WITH PROPOFOL
Anesthesia: General

## 2017-01-31 MED ORDER — INFLUENZA VAC SPLIT HIGH-DOSE 0.5 ML IM SUSY
0.5000 mL | PREFILLED_SYRINGE | Freq: Once | INTRAMUSCULAR | Status: DC
Start: 2017-02-01 — End: 2017-02-01

## 2017-01-31 MED ORDER — SODIUM CHLORIDE 0.9 % IV SOLN
INTRAVENOUS | Status: DC | PRN
  Administered 2017-01-31: 13:00:00 via INTRAVENOUS

## 2017-01-31 MED ORDER — PROPOFOL 500 MG/50ML IV EMUL
INTRAVENOUS | Status: DC | PRN
  Administered 2017-01-31: 120 ug/kg/min via INTRAVENOUS

## 2017-01-31 MED ORDER — TRAMADOL HCL 50 MG PO TABS: 50.0000 mg | ORAL_TABLET | Freq: Four times a day (QID) | ORAL | Status: DC | PRN

## 2017-01-31 MED ORDER — FUROSEMIDE 20 MG PO TABS
20.0000 mg | ORAL_TABLET | Freq: Every day | ORAL | Status: DC
  Administered 2017-01-31 – 2017-02-01 (×2): 20 mg via ORAL
  Filled 2017-01-31 (×2): qty 1

## 2017-01-31 MED ORDER — PROPOFOL 10 MG/ML IV BOLUS
INTRAVENOUS | Status: DC | PRN
  Administered 2017-01-31: 50 mg via INTRAVENOUS
  Administered 2017-01-31: 20 mg via INTRAVENOUS

## 2017-01-31 MED ORDER — PROPOFOL 500 MG/50ML IV EMUL
INTRAVENOUS | Status: AC
Start: 2017-01-31 — End: 2017-01-31
  Filled 2017-01-31: qty 50

## 2017-01-31 MED ORDER — SUCRALFATE 1 G PO TABS
1.0000 g | ORAL_TABLET | Freq: Three times a day (TID) | ORAL | Status: DC
  Administered 2017-01-31 – 2017-02-01 (×3): 1 g via ORAL
  Filled 2017-01-31 (×3): qty 1

## 2017-01-31 NOTE — Anesthesia Post-op Follow-up Note (Signed)
Anesthesia QCDR form completed.        

## 2017-01-31 NOTE — Progress Notes (Signed)
Nutrition Brief Note  Patient identified on the Malnutrition Screening Tool (MST) Report  Wt Readings from Last 15 Encounters:  01/31/17 185 lb 1.6 oz (84 kg)  10/25/16 194 lb (88 kg)  06/11/16 194 lb 8 oz (88.2 kg)  02/23/16 198 lb (89.8 kg)  09/11/15 195 lb (88.5 kg)  07/24/15 198 lb (89.8 kg)  07/20/15 196 lb 12.8 oz (89.3 kg)  06/13/15 195 lb 9 oz (88.7 kg)  05/05/15 189 lb (85.7 kg)  04/03/15 186 lb (84.4 kg)  03/22/15 195 lb 1.6 oz (88.5 kg)  03/10/15 195 lb 12.8 oz (88.8 kg)  03/03/15 194 lb 0.1 oz (88 kg)  05/23/14 197 lb 8 oz (89.6 kg)  05/14/14 197 lb (89.4 kg)    Body mass index is 25.1 kg/m. Patient meets criteria for overweight based on current BMI.   Kyle [name]  is a 83 y.o. male with a known history of CVA on Eliquis, hypertension, stroke presents to the emergency room from his primary care physician's office due to 2 days of black stools and blood in stool  Patient was unsure of weight loss. Appears relatively stable. Good appetite, very hungry at this time. States he had a poor appetite for about 1 month PTA but continued to force himself to eat. Normally eats a sausage, egg and ham biscuit for breakfast and beans, potatoes, and cornbread for lunch and similar meal for dinner.   Labs and medications reviewed.  Current diet order is Clear liquid, patient is consuming approximately 100% of meals at this time.  No nutrition interventions warranted at this time. If nutrition issues arise, please consult RD.   Satira Anis. Mort Smelser, MS, RD LDN Inpatient Clinical Dietitian Pager [phone]

## 2017-01-31 NOTE — Progress Notes (Signed)
[place] [facility] [place] NAME: Kyle [name]    MR#:  [id number]  DATE OF BIRTH:  [birth year]  SUBJECTIVE:  CHIEF COMPLAINT:   Chief Complaint  Patient presents with  . Chest Pain     Came with black stool and feeling weak. USes Goody powder daily.   Hb is stable. Plan for endoscopy today.  REVIEW OF SYSTEMS:  CONSTITUTIONAL: No fever, fatigue or weakness.  EYES: No blurred or double vision.  EARS, NOSE, AND THROAT: No tinnitus or ear pain.  RESPIRATORY: No cough, shortness of breath, wheezing or hemoptysis.  CARDIOVASCULAR: No chest pain, orthopnea, edema.  GASTROINTESTINAL: No nausea, vomiting, diarrhea or abdominal pain. Have dark stool. GENITOURINARY: No dysuria, hematuria.  ENDOCRINE: No polyuria, nocturia,  HEMATOLOGY: No anemia, easy bruising or bleeding SKIN: No rash or lesion. MUSCULOSKELETAL: No joint pain or arthritis.   NEUROLOGIC: No tingling, numbness, weakness.  PSYCHIATRY: No anxiety or depression.   ROS  DRUG ALLERGIES:   Allergies  Allergen Reactions  . Simvastatin     REACTION: myalgias    VITALS:  Blood pressure (!) 147/64, pulse 72, temperature 98.9 F (37.2 C), temperature source Oral, resp. rate 18, height 6' (1.829 m), weight 84 kg (185 lb 1.6 oz), SpO2 95 %.  PHYSICAL EXAMINATION:  GENERAL:  83 y.o.-year-old patient lying in the bed with no acute distress.  EYES: Pupils equal, round, reactive to light and accommodation. No scleral icterus. Extraocular muscles intact.  HEENT: Head atraumatic, normocephalic. Oropharynx and nasopharynx clear.  NECK:  Supple, no jugular venous distention. No thyroid enlargement, no tenderness.  LUNGS: Normal breath sounds bilaterally, no wheezing, rales,rhonchi or crepitation. No use of accessory muscles of respiration.  CARDIOVASCULAR: S1, S2 normal. No murmurs, rubs, or gallops.  ABDOMEN: Soft, nontender, nondistended. Bowel sounds present. No organomegaly or mass.   EXTREMITIES: No pedal edema, cyanosis, or clubbing.  NEUROLOGIC: Cranial nerves II through XII are intact. Muscle strength 5/5 in all extremities. Sensation intact. Gait not checked.  PSYCHIATRIC: The patient is alert and oriented x 3.  SKIN: No obvious rash, lesion, or ulcer.   Physical Exam LABORATORY PANEL:   CBC  Recent Labs Lab 01/31/17 0434  WBC 12.6*  HGB 12.0*  HCT 35.1*  PLT 180   ------------------------------------------------------------------------------------------------------------------  Chemistries   Recent Labs Lab 01/30/17 0946 01/31/17 0434  NA 144 145  K 3.8 3.3*  CL 108 116*  CO2 24 24  GLUCOSE 142* 129*  BUN 44* 36*  CREATININE 1.31* 1.11  CALCIUM 9.3 8.6*  MG 1.6*  --   AST 26  --   ALT 11*  --   ALKPHOS 61  --   BILITOT 0.8  --    ------------------------------------------------------------------------------------------------------------------  Cardiac Enzymes  Recent Labs Lab 01/30/17 1506 01/30/17 2048  TROPONINI 0.06* 0.06*   ------------------------------------------------------------------------------------------------------------------  RADIOLOGY:  No results found.  ASSESSMENT AND PLAN:   Active Problems:   Melena   Acute peptic ulcer of stomach   Gastrointestinal hemorrhage  * Melena with upper GI bleed. Likely peptic ulcer disease due to Goody powders Hypotensive Bolus NS 500 ml , and BP stable now. Blood transfused in ED Repeat Hb in the morning- stable. Hold anticoagulation PPI IV.   EGD showed Impression:           - LA Grade D reflux esophagitis.                       - Non-bleeding  gastric ulcers with no stigmata of                        bleeding.                       - Normal examined duodenum.                       - No specimens collected. Keep on PPI BID and start sucralfate.  Start on clear liquid diet. Upgrade tomorrow.  * Acute bronchitis with mild wheezing.   IV steroids and nebulizers.  Not needing oxygen. No respiratory distress.  * Mild elevation troponin is chronic. Lower than in the past. No chest pain.  * DVT prophylaxis with SCDs    All the records are reviewed and case discussed with Care Management/Social Workerr. Management plans discussed with the patient, family and they are in agreement.  CODE STATUS: full.  TOTAL TIME TAKING CARE OF THIS PATIENT:35 minutes.   Spoke to his wife and son.  POSSIBLE D/C IN 1-2 DAYS, DEPENDING ON CLINICAL CONDITION.   Vaughan Basta M.D on 01/31/2017   Between 7am to 6pm - Pager - [phone]  After 6pm go to [email or web address] - password [email or web address] [email or web address]  Office  [phone]  CC: Primary care physician; Venia Carbon, MD  Note: This dictation was prepared with Dragon dictation along with smaller phrase technology. Any transcriptional errors that result from this process are unintentional.

## 2017-01-31 NOTE — Anesthesia Postprocedure Evaluation (Signed)
Anesthesia Post Note  Patient: Kyle [name]  Procedure(s) Performed: ESOPHAGOGASTRODUODENOSCOPY (EGD) WITH PROPOFOL (N/A )  Patient location during evaluation: Endoscopy Anesthesia Type: General Level of consciousness: awake and alert Pain management: pain level controlled Vital Signs Assessment: post-procedure vital signs reviewed and stable Respiratory status: spontaneous breathing and respiratory function stable Cardiovascular status: stable Anesthetic complications: no     Last Vitals:  Vitals:   01/31/17 1247 01/31/17 1248  BP: 131/72 131/72  Pulse: 77 75  Resp: 20 (!) 30  Temp: (!) 36.1 C (!) 36.3 C  SpO2: 94% 94%    Last Pain:  Vitals:   01/31/17 1248  TempSrc: Tympanic  PainSc:                  KEPHART,WILLIAM K

## 2017-01-31 NOTE — Care Management (Signed)
No discharge needs identified by members of the care team during progression

## 2017-01-31 NOTE — Care Management Important Message (Signed)
Important Message  Patient Details  Name: Kyle [name] MRN: [id number] Date of Birth: Jul 25, 1931   Medicare Important Message Given:  Yes Signed IM notice given    Katrina Stack, RN 01/31/2017, 12:18 PM

## 2017-01-31 NOTE — Transfer of Care (Signed)
Immediate Anesthesia Transfer of Care Note  Patient: Kyle [name]  Procedure(s) Performed: ESOPHAGOGASTRODUODENOSCOPY (EGD) WITH PROPOFOL (N/A )  Patient Location: Endoscopy Unit  Anesthesia Type:General  Level of Consciousness: awake  Airway & Oxygen Therapy: Patient Spontanous Breathing and Patient connected to nasal cannula oxygen  Post-op Assessment: Report given to RN and Post -op Vital signs reviewed and stable  Post vital signs: Reviewed  Last Vitals:  Vitals:   01/31/17 0939 01/31/17 1247  BP: (!) 146/82 131/72  Pulse: 81 77  Resp: 18 20  Temp: 36.9 C (!) 36.1 C  SpO2: 96% 94%    Last Pain:  Vitals:   01/31/17 0939  TempSrc: Oral  PainSc: 0-No pain         Complications: No apparent anesthesia complications

## 2017-01-31 NOTE — Op Note (Signed)
[facility] Gastroenterology Patient Name: Kyle [name] Procedure Date: 01/31/2017 12:33 PM MRN: [id number] Account #: [id number] Date of Birth: Feb 18, 1932 Admit Type: Inpatient Age: 83 Room: [facility] ENDO ROOM 3 Gender: Male Note Status: Finalized Procedure:            Upper GI endoscopy Indications:          Melena Providers:            Lucilla Lame MD, MD Referring MD:         Venia Carbon (Referring MD) Medicines:            Propofol per Anesthesia Complications:        No immediate complications. Procedure:            Pre-Anesthesia Assessment:                       - Prior to the procedure, a History and Physical was                        performed, and patient medications and allergies were                        reviewed. The patient's tolerance of previous                        anesthesia was also reviewed. The risks and benefits of                        the procedure and the sedation options and risks were                        discussed with the patient. All questions were                        answered, and informed consent was obtained. Prior                        Anticoagulants: The patient has taken no previous                        anticoagulant or antiplatelet agents. ASA Grade                        Assessment: II - A patient with mild systemic disease.                        After reviewing the risks and benefits, the patient was                        deemed in satisfactory condition to undergo the                        procedure.                       After obtaining informed consent, the endoscope was                        passed under direct vision. Throughout the procedure,  the patient's blood pressure, pulse, and oxygen                        saturations were monitored continuously. The Endoscope                        was introduced through the mouth, and advanced to the                        second part  of duodenum. The upper GI endoscopy was                        accomplished without difficulty. The patient tolerated                        the procedure well. Findings:      LA Grade D (one or more mucosal breaks involving at least 75% of       esophageal circumference) esophagitis with bleeding was found in the       lower third of the esophagus.      Two non-bleeding cratered gastric ulcers with no stigmata of bleeding       were found in the gastric antrum.      The examined duodenum was normal. Impression:           - LA Grade D reflux esophagitis.                       - Non-bleeding gastric ulcers with no stigmata of                        bleeding.                       - Normal examined duodenum.                       - No specimens collected. Recommendation:       - Return patient to hospital ward for ongoing care.                       - Advance diet as tolerated.                       - Continue present medications. Procedure Code(s):    --- Professional ---                       [phone], Esophagogastroduodenoscopy, flexible, transoral;                        diagnostic, including collection of specimen(s) by                        brushing or washing, when performed (separate procedure) Diagnosis Code(s):    --- Professional ---                       K92.1, Melena (includes Hematochezia)                       K25.9, Gastric ulcer, unspecified as acute or chronic,  without hemorrhage or perforation                       K21.0, Gastro-esophageal reflux disease with esophagitis CPT copyright 2016 American Medical Association. All rights reserved. The codes documented in this report are preliminary and upon coder review may  be revised to meet current compliance requirements. Lucilla Lame MD, MD 01/31/2017 12:44:21 PM This report has been signed electronically. Number of Addenda: 0 Note Initiated On: 01/31/2017 12:33 PM      [facility]

## 2017-01-31 NOTE — Anesthesia Preprocedure Evaluation (Signed)
Anesthesia Evaluation  Patient identified by MRN, date of birth, ID band Patient awake    Reviewed: Allergy & Precautions, NPO status , Patient's Chart, lab work & pertinent test results  History of Anesthesia Complications Negative for: history of anesthetic complications  Airway Mallampati: III       Dental  (+) Edentulous Upper, Edentulous Lower   Pulmonary neg sleep apnea, neg COPD,           Cardiovascular hypertension, Pt. on medications + CAD, + Past MI, + Cardiac Stents and +CHF  (-) dysrhythmias (-) Valvular Problems/Murmurs     Neuro/Psych neg Seizures Anxiety CVA (brain stem, no residual), No Residual Symptoms    GI/Hepatic Neg liver ROS, GERD  Medicated,  Endo/Other  neg diabetes  Renal/GU Renal InsufficiencyRenal disease     Musculoskeletal   Abdominal   Peds  Hematology   Anesthesia Other Findings   Reproductive/Obstetrics                             Anesthesia Physical Anesthesia Plan  ASA: III  Anesthesia Plan: General   Post-op Pain Management:    Induction: Intravenous  PONV Risk Score and Plan: Propofol infusion  Airway Management Planned: Nasal Cannula  Additional Equipment:   Intra-op Plan:   Post-operative Plan:   Informed Consent: I have reviewed the patients History and Physical, chart, labs and discussed the procedure including the risks, benefits and alternatives for the proposed anesthesia with the patient or authorized representative who has indicated his/her understanding and acceptance.     Plan Discussed with:   Anesthesia Plan Comments:         Anesthesia Quick Evaluation

## 2017-02-01 LAB — BPAM RBC
Blood Product Expiration Date: 201810172359
Blood Product Expiration Date: 201810172359
ISSUE DATE / TIME: 201810111136
Unit Type and Rh: 9500
Unit Type and Rh: 9500

## 2017-02-01 LAB — PREPARE RBC (CROSSMATCH)

## 2017-02-01 LAB — TYPE AND SCREEN
ABO/RH(D): O NEG
Antibody Screen: NEGATIVE
Unit division: 0
Unit division: 0

## 2017-02-01 LAB — HEMOGLOBIN: Hemoglobin: 11.9 g/dL — ABNORMAL LOW (ref 13.0–18.0)

## 2017-02-01 MED ORDER — PREDNISONE 20 MG PO TABS: 40.0000 mg | ORAL_TABLET | Freq: Every day | ORAL | 0 refills | Status: AC

## 2017-02-01 MED ORDER — ALBUTEROL SULFATE HFA 108 (90 BASE) MCG/ACT IN AERS: 2.0000 | INHALATION_SPRAY | Freq: Four times a day (QID) | RESPIRATORY_TRACT | 0 refills | Status: DC | PRN

## 2017-02-01 MED ORDER — SUCRALFATE 1 G PO TABS: 1.0000 g | ORAL_TABLET | Freq: Three times a day (TID) | ORAL | 0 refills | Status: DC

## 2017-02-01 MED ORDER — FUROSEMIDE 40 MG PO TABS: 60.0000 mg | ORAL_TABLET | Freq: Once | ORAL | Status: DC

## 2017-02-01 MED ORDER — PANTOPRAZOLE SODIUM 40 MG PO TBEC: 40.0000 mg | DELAYED_RELEASE_TABLET | Freq: Two times a day (BID) | ORAL | 0 refills | Status: DC

## 2017-02-01 MED ORDER — FUROSEMIDE 20 MG PO TABS
20.0000 mg | ORAL_TABLET | Freq: Every day | ORAL | Status: DC
  Filled 2017-02-01: qty 1

## 2017-02-01 MED ORDER — POTASSIUM CHLORIDE 20 MEQ PO PACK
40.0000 meq | PACK | Freq: Once | ORAL | Status: AC
  Administered 2017-02-01: 40 meq via ORAL
  Filled 2017-02-01: qty 2

## 2017-02-01 NOTE — Discharge Instructions (Addendum)
Resume diet and activity as before  Stop taking eliquis. You can start taking Eliquis from 02/09/2017 if you do not have any black stool or blood in stool.

## 2017-02-01 NOTE — Progress Notes (Signed)
Patient is to be discharged home today. Patient is in no acute distress at this time, and assessment is unchanged from this morning. Patient's IV is out, discharge paperwork has been discussed with patient/family and there are no questions or concerns at this time. Patient will be accompanied downstairs by staff and family via wheelchair.   

## 2017-02-01 NOTE — Progress Notes (Signed)
Pt refusing bed alarm. Discussed reason why we use it in hospital and that it is for his safety. Pt is aware of risks associated with not using the alarm, but continues to refuse it. He is alert and oriented x 3, and is able to verbalize how to call me, call tech, and use call bell. Call bell within reach. He states he will not get up without calling and he will wait for assistance once he has called before getting up.

## 2017-02-03 ENCOUNTER — Encounter: Payer: Self-pay | Admitting: Gastroenterology

## 2017-02-03 ENCOUNTER — Telehealth: Payer: Self-pay | Admitting: Internal Medicine

## 2017-02-03 NOTE — Telephone Encounter (Signed)
Transition Care Management Follow-up Telephone Call  How have you been since you were released from the hospital? Patient stated he feels better, no blood in stool since being home.   Do you understand why you were in the hospital? Yes   Do you understand the discharge instrcutions? Yes  Items Reviewed:  Medications reviewed: Yes  Allergies reviewed: Yes  Dietary changes reviewed: Yes  Referrals reviewed: Yes   Functional Questionnaire:   Activities of Daily Living (ADLs):   He states they are independent in the following: Patient stated he is independent at all [place] they require assistance with the following: No assistance needed at this time.   Any transportation issues/concerns?: No   Any patient concerns? No    Confirmed importance and date/time of follow-up visits scheduled: Yes.   Confirmed with patient if condition begins to worsen call PCP or go to the ER.  Patient was given the Call-a-Nurse line [phone]: Yes

## 2017-02-05 ENCOUNTER — Ambulatory Visit (INDEPENDENT_AMBULATORY_CARE_PROVIDER_SITE_OTHER): Payer: Medicare Other | Admitting: Internal Medicine

## 2017-02-05 ENCOUNTER — Encounter: Payer: Self-pay | Admitting: Internal Medicine

## 2017-02-05 VITALS — BP 124/72 | HR 65 | Temp 97.3°F | Wt 192.2 lb

## 2017-02-05 DIAGNOSIS — K2100 Gastro-esophageal reflux disease with esophagitis, without bleeding: Secondary | ICD-10-CM

## 2017-02-05 DIAGNOSIS — I480 Paroxysmal atrial fibrillation: Secondary | ICD-10-CM | POA: Diagnosis not present

## 2017-02-05 DIAGNOSIS — I255 Ischemic cardiomyopathy: Secondary | ICD-10-CM

## 2017-02-05 DIAGNOSIS — K253 Acute gastric ulcer without hemorrhage or perforation: Secondary | ICD-10-CM | POA: Diagnosis not present

## 2017-02-05 LAB — CBC
HCT: 36.2 % — ABNORMAL LOW (ref 39.0–52.0)
Hemoglobin: 11.7 g/dL — ABNORMAL LOW (ref 13.0–17.0)
MCHC: 32.2 g/dL (ref 30.0–36.0)
MCV: 95.2 fl (ref 78.0–100.0)
Platelets: 217 10*3/uL (ref 150.0–400.0)
RBC: 3.8 Mil/uL — ABNORMAL LOW (ref 4.22–5.81)
RDW: 16.4 % — ABNORMAL HIGH (ref 11.5–15.5)
WBC: 10.7 10*3/uL — ABNORMAL HIGH (ref 4.0–10.5)

## 2017-02-05 MED ORDER — TRAMADOL HCL 50 MG PO TABS: 50.0000 mg | ORAL_TABLET | Freq: Three times a day (TID) | ORAL | 0 refills | Status: DC | PRN

## 2017-02-05 NOTE — Progress Notes (Signed)
Subjective:    Patient ID: Kyle [name], male    DOB: 1931/12/19, 83 y.o.   MRN: [id number]  HPI Here for follow up of recent hospitalization  He felt "sick"---hurting all over Was passing red blood Nausea but no vomiting Still feels weak Never had abdominal pain  Might have had flu like illness Seems to be over that part Started on pantoprazole after EGD showed esophagitis and 2 gastric ulcers No H pylori testing done apparently Did get transfused 1 unit  Appetite is good---trying to be careful Bowels are good---normal brown stool  In thinking back, he does note regular heartburn type symptoms  Current Outpatient Prescriptions on File Prior to Visit  Medication Sig Dispense Refill  . albuterol (PROVENTIL HFA;VENTOLIN HFA) 108 (90 Base) MCG/ACT inhaler Inhale 2 puffs into the lungs every 6 (six) hours as needed for wheezing or shortness of breath. 1 Inhaler 0  . apixaban (ELIQUIS) 2.5 MG TABS tablet Take 1 tablet (2.5 mg total) by mouth 2 (two) times daily. 60 tablet 10  . atorvastatin (LIPITOR) 80 MG tablet TAKE ONE TABLET EVERY DAY AT 6PM 30 tablet 1  . furosemide (LASIX) 20 MG tablet Take 1 tablet (20 mg total) by mouth daily. 30 tablet 10  . losartan (COZAAR) 50 MG tablet Take 1 tablet (50 mg total) by mouth daily. 90 tablet 3  . pantoprazole (PROTONIX) 40 MG tablet Take 1 tablet (40 mg total) by mouth 2 (two) times daily before a meal. 60 tablet 0  . sucralfate (CARAFATE) 1 g tablet Take 1 tablet (1 g total) by mouth 4 (four) times daily -  with meals and at bedtime. 120 tablet 0  . traMADol (ULTRAM) 50 MG tablet TAKE 1 TABLET BY MOUTH 3 TIMES DAILY AS NEEDED 90 tablet 0   No current facility-administered medications on file prior to visit.     Allergies  Allergen Reactions  . Simvastatin     REACTION: myalgias    Past Medical History:  Diagnosis Date  . Arthritis   . Cancer [facility])    prostate CA  . Claustrophobia    "EXTREMELY" (03/02/2015)  . Detached  retina    right  . GERD (gastroesophageal reflux disease)   . H/O dizziness   . History of prostate cancer   . Hyperlipidemia   . Hypertension   . Impaired fasting glucose   . Stroke [facility]) ~ 1995-02/2009 X 2   /notes 08/22/2010  . Urgency of urination     Past Surgical History:  Procedure Laterality Date  . CARDIAC CATHETERIZATION  ~ 1995   Ar[facility] Endo 08/22/2010  . CARDIAC CATHETERIZATION N/A 03/02/2015   Procedure: Left Heart Cath and Coronary Angiography;  Surgeon: Jettie Booze, MD;  Location: [place] CV LAB;  Service: Cardiovascular;  Laterality: N/A;  . CARDIAC CATHETERIZATION  03/02/2015   Procedure: Coronary Stent Intervention;  Surgeon: Jettie Booze, MD;  Location: [place] CV LAB;  Service: Cardiovascular;;  . COLONOSCOPY W/ POLYPECTOMY    . ESOPHAGOGASTRODUODENOSCOPY (EGD) WITH PROPOFOL N/A 01/31/2017   Procedure: ESOPHAGOGASTRODUODENOSCOPY (EGD) WITH PROPOFOL;  Surgeon: Lucilla Lame, MD;  [facility] END[facility]COPY;  Service: Endoscopy;  Laterality: N/A;  . EYE SURGERY Right   . HERNIA REPAIR     hx UHR/notes 08/22/2010  . INGUINAL HERNIA REPAIR Left    [facility]o 08/22/2010  . LUMBAR LAMINECTOMY/DECOMPRESSION MICRODISCECTOMY  01/24/2012   Procedure: LUMBAR LAMINECTOMY/DECOMPRESSION MICRODISCECTOMY 1 LEVEL;  Surgeon: Winfield Cunas, MD;  Location: [facility] NEURO ORS;  Service:  Neurosurgery;  Laterality: Right;  RIGHT Lumbar Three-Four far lateral diskectomy  . [place]   [facility] Endo 08/22/2010  . RETINAL DETACHMENT REPAIR W/ SCLERAL BUCKLE LE Right 08/2006   [facility] Endo 08/22/2010  . TONSILLECTOMY    . UMBILICAL HERNIA REPAIR     hx/notes 08/22/2010    Family History  Problem Relation Age of Onset  . Heart disease Mother   . Coronary artery disease Mother   . Heart disease Father   . Coronary artery disease Father   . Heart disease Brother   . Heart disease Brother   . Cancer Brother        prostate  . Diabetes Neg Hx     Social History   Social History  .  Marital status: Married    Spouse name: N/A  . Number of children: 1  . Years of education: N/A   Occupational History  . retired Retired   Social History Main Topics  . Smoking status: Never Smoker  . Smokeless tobacco: Former Systems developer    Types: Chew    Quit date: 04/22/2004  . Alcohol use No  . Drug use: No  . Sexual activity: Not on file   Other Topics Concern  . Not on file   Social History Narrative   Has living will   Wife is health care POA   Would accept CPR but no prolonged machines.    Would not want a feeding tube if cognitively unaware            Review of Systems Off eliquis for now---supposed to resume in 2 days (scheduled off x  ~2 weeks) No palpitations No chest pain No SOB     Objective:   Physical Exam  Constitutional: No distress.  Neck: No thyromegaly present.  Cardiovascular: Normal rate, regular rhythm and normal heart sounds.  Exam reveals no gallop.   No murmur heard. Pulmonary/Chest: Effort normal. No respiratory distress. He has no wheezes.  Fine bibasilar crackles  Abdominal: Soft. Bowel sounds are normal. He exhibits no distension and no mass. There is no tenderness. There is no rebound and no guarding.  Musculoskeletal: He exhibits no edema.  Lymphadenopathy:    He has no cervical adenopathy.          Assessment & Plan:

## 2017-02-05 NOTE — Assessment & Plan Note (Signed)
Discussed the clear cut evidence that it is best to go back on the anticoagulant after a GI bleed----overall risks are lower

## 2017-02-05 NOTE — Assessment & Plan Note (Signed)
Has ongoing heart burn symptoms----will need to stay on PPI indefinitely given the severity of the esophagitis

## 2017-02-05 NOTE — Assessment & Plan Note (Signed)
Seems to have settled down on the PPI Will go back to Dr Allen Norris I don't see H pylori testing---will leave duration at bid for PPI up to him, but should not stop the PPI in view of chronic heartburn symptoms

## 2017-02-06 NOTE — Discharge Summary (Signed)
Kyle [name] at [place] NAME: Kyle [name]    MR#:  [id number]  DATE OF BIRTH:  03/05/1932  DATE OF ADMISSION:  01/30/2017 ADMITTING PHYSICIAN: Hillary Bow, MD  DATE OF DISCHARGE: 02/01/2017 11:34 AM  PRIMARY CARE PHYSICIAN: Venia Carbon, MD   ADMISSION DIAGNOSIS:  Gastrointestinal hemorrhage, unspecified gastrointestinal hemorrhage type [K92.2]  DISCHARGE DIAGNOSIS:  Active Problems:   Acute peptic ulcer of stomach   SECONDARY DIAGNOSIS:   Past Medical History:  Diagnosis Date  . Arthritis   . Cancer [facility])    prostate CA  . Claustrophobia    "EXTREMELY" (03/02/2015)  . Detached retina    right  . GERD (gastroesophageal reflux disease)   . H/O dizziness   . History of prostate cancer   . Hyperlipidemia   . Hypertension   . Impaired fasting glucose   . Stroke [facility]) ~ 1995-02/2009 X 2   /notes 08/22/2010  . Urgency of urination      ADMITTING HISTORY  HISTORY OF PRESENT ILLNESS:  [name]  is a 83 y.o. male with a known history of CVA on Eliquis, hypertension, stroke presents to the emergency room from his primary care physician's office due to 2 days of black stools and blood in stool. Patient feels dizzy, weak some shortness of breath. Stool positive for blood. Hemoglobin 12.3. Was hypotensive at his doctor's office. He does take [place] powder daily.  HOSPITAL COURSE:   * Acute blood loss anemia secondary to upper GI bleed with esophagitis and nonbleeding gastric ulcers on EGD. One unit packed RBC transfusion during the hospital stay. Started on Protonix drip. Patient had EGD done which showed esophagitis and nonbleeding gastric ulcers. Hemoglobin stable by the day of discharge. No further bleeding noticed. He had epigastric and lower chest pain which has resolved. Patient is being started on Protonix, Carafate and iron supplements at discharge. Follow-up with GI in 2-3 weeks. Counseled to not take any more [place]  powder.  Patient stable for discharge home.  CONSULTS OBTAINED:  Treatment Team:  Lucilla Lame, MD  DRUG ALLERGIES:   Allergies  Allergen Reactions  . Simvastatin     REACTION: myalgias    DISCHARGE MEDICATIONS:   Discharge Medication List as of 02/01/2017 10:54 AM    START taking these medications   Details  albuterol (PROVENTIL HFA;VENTOLIN HFA) 108 (90 Base) MCG/ACT inhaler Inhale 2 puffs into the lungs every 6 (six) hours as needed for wheezing or shortness of breath., Starting Sat 02/01/2017, Normal    pantoprazole (PROTONIX) 40 MG tablet Take 1 tablet (40 mg total) by mouth 2 (two) times daily before a meal., Starting Sat 02/01/2017, Until Sun 02/01/2018, Normal    predniSONE (DELTASONE) 20 MG tablet Take 2 tablets (40 mg total) by mouth daily., Starting Sat 02/01/2017, Until Tue 02/04/2017, Normal    sucralfate (CARAFATE) 1 g tablet Take 1 tablet (1 g total) by mouth 4 (four) times daily -  with meals and at bedtime., Starting Sat 02/01/2017, Normal      CONTINUE these medications which have NOT CHANGED   Details  apixaban (ELIQUIS) 2.5 MG TABS tablet Take 1 tablet (2.5 mg total) by mouth 2 (two) times daily., Starting Thu 05/02/2016, Normal    atorvastatin (LIPITOR) 80 MG tablet TAKE ONE TABLET EVERY DAY AT 6PM, Normal    furosemide (LASIX) 20 MG tablet Take 1 tablet (20 mg total) by mouth daily., Starting Thu 10/31/2016, Normal    losartan (COZAAR) 50 MG  tablet Take 1 tablet (50 mg total) by mouth daily., Starting Thu 04/18/2016, Normal    traMADol (ULTRAM) 50 MG tablet TAKE 1 TABLET BY MOUTH 3 TIMES DAILY AS NEEDED, Phone In        Today   VITAL SIGNS:  Blood pressure 121/74, pulse 73, temperature 98.6 F (37 C), temperature source Oral, resp. rate 16, height 6' (1.829 m), weight 82.8 kg (182 lb 9.6 oz), SpO2 94 %.  I/O:  No intake or output data in the 24 hours ending 02/06/17 1429  PHYSICAL EXAMINATION:  Physical Exam  GENERAL:  83 y.o.-year-old  patient lying in the bed with no acute distress.  LUNGS: Normal breath sounds bilaterally, no wheezing, rales,rhonchi or crepitation. No use of accessory muscles of respiration.  CARDIOVASCULAR: S1, S2 normal. No murmurs, rubs, or gallops.  ABDOMEN: Soft, non-tender, non-distended. Bowel sounds present. No organomegaly or mass.  NEUROLOGIC: Moves all 4 extremities. PSYCHIATRIC: The patient is alert and oriented x 3.  SKIN: No obvious rash, lesion, or ulcer.   DATA REVIEW:   CBC  Recent Labs Lab 02/05/17 0839  WBC 10.7*  HGB 11.7*  HCT 36.2*  PLT 217.0    Chemistries   Recent Labs Lab 01/31/17 0434  NA 145  K 3.3*  CL 116*  CO2 24  GLUCOSE 129*  BUN 36*  CREATININE 1.11  CALCIUM 8.6*    Cardiac Enzymes  Recent Labs Lab 01/30/17 2048  TROPONINI 0.06*    Microbiology Results  Results for orders placed or performed during the hospital encounter of 02/27/15  Urine culture     Status: None   Collection Time: 02/27/15  8:14 AM  Result Value Ref Range Status   Specimen Description URINE, CLEAN CATCH  Final   Special Requests NONE  Final   Culture MULTIPLE SPECIES PRESENT, SUGGEST RECOLLECTION  Final   Report Status 02/28/2015 FINAL  Final  Culture, blood (routine x 2) Call MD if unable to obtain prior to antibiotics being given     Status: None   Collection Time: 02/27/15 10:40 AM  Result Value Ref Range Status   Specimen Description BLOOD LEFT ARM  Final   Special Requests BOTTLES DRAWN AEROBIC ONLY 10CC  Final   Culture NO GROWTH 5 DAYS  Final   Report Status 03/04/2015 FINAL  Final  Culture, blood (routine x 2) Call MD if unable to obtain prior to antibiotics being given     Status: None   Collection Time: 02/27/15 10:45 AM  Result Value Ref Range Status   Specimen Description BLOOD LEFT HAND  Final   Special Requests BOTTLES DRAWN AEROBIC AND ANAEROBIC 5CC  Final   Culture NO GROWTH 5 DAYS  Final   Report Status 03/04/2015 FINAL  Final    RADIOLOGY:   No results found.  Follow up with PCP in 1 week.  Management plans discussed with the patient, family and they are in agreement.  CODE STATUS:  Code Status History    Date Active Date Inactive Code Status Order ID Comments User Context   01/30/2017 11:43 AM 02/01/2017  2:35 PM Full Code [id number]  Hillary Bow, MD ED   03/02/2015 11:26 AM 03/03/2015  6:11 PM Full Code [id number]  Jettie Booze, MD Inpatient   02/27/2015  9:00 AM 03/02/2015 11:26 AM Full Code [id number]  Karen Kitchens Inpatient   05/10/2014 10:34 PM 05/15/2014  3:45 PM Full Code [id number]  Allyne Gee, MD ED    Advance Directive Documentation  Most Recent Value  Type of Advance Directive  Healthcare Power of Attorney, Living will  Pre-existing out of facility DNR order (yellow form or pink MOST form)  -  "MOST" Form in Place?  -      TOTAL TIME TAKING CARE OF THIS PATIENT ON DAY OF DISCHARGE: more than 30 minutes.   Hillary Bow R M.D on 02/06/2017 at 2:29 PM  Between 7am to 6pm - Pager - [phone]  After 6pm go to [email or web address] - password [email or web address] [facility]  Office  [phone]  CC: Primary care physician; Venia Carbon, MD  Note: This dictation was prepared with Dragon dictation along with smaller phrase technology. Any transcriptional errors that result from this process are unintentional.

## 2017-02-11 ENCOUNTER — Encounter: Payer: Self-pay | Admitting: Gastroenterology

## 2017-02-11 ENCOUNTER — Ambulatory Visit (INDEPENDENT_AMBULATORY_CARE_PROVIDER_SITE_OTHER): Payer: Medicare Other | Admitting: Gastroenterology

## 2017-02-11 VITALS — BP 129/76 | HR 76 | Temp 98.0°F | Ht 69.5 in | Wt 192.0 lb

## 2017-02-11 DIAGNOSIS — Z961 Presence of intraocular lens: Secondary | ICD-10-CM | POA: Insufficient documentation

## 2017-02-11 DIAGNOSIS — I255 Ischemic cardiomyopathy: Secondary | ICD-10-CM | POA: Diagnosis not present

## 2017-02-11 DIAGNOSIS — K279 Peptic ulcer, site unspecified, unspecified as acute or chronic, without hemorrhage or perforation: Secondary | ICD-10-CM | POA: Diagnosis not present

## 2017-02-11 NOTE — Progress Notes (Signed)
Primary Care Physician: Venia Carbon, MD  Primary Gastroenterologist:  Dr. Lucilla Lame  No chief complaint on file.   HPI: Kyle [name] is a 83 y.o. male here for follow-up after being in the hospital with a GI bleed.  The patient was found to have peptic ulcer disease.  The patient had no further bleeding was sent home for outpatient follow-up. The patient reports that his stools have turned back to normal color and he is no longer having black stools.  The patient has also stated that he is continuing to take his Carafate and Protonix.  The patient has stopped using BC powders  Current Outpatient Prescriptions  Medication Sig Dispense Refill  . albuterol (PROVENTIL HFA;VENTOLIN HFA) 108 (90 Base) MCG/ACT inhaler Inhale 2 puffs into the lungs every 6 (six) hours as needed for wheezing or shortness of breath. 1 Inhaler 0  . apixaban (ELIQUIS) 2.5 MG TABS tablet Take 1 tablet (2.5 mg total) by mouth 2 (two) times daily. 60 tablet 10  . atorvastatin (LIPITOR) 80 MG tablet TAKE ONE TABLET EVERY DAY AT 6PM 30 tablet 1  . furosemide (LASIX) 20 MG tablet Take 1 tablet (20 mg total) by mouth daily. 30 tablet 10  . losartan (COZAAR) 50 MG tablet Take 1 tablet (50 mg total) by mouth daily. 90 tablet 3  . pantoprazole (PROTONIX) 40 MG tablet Take 1 tablet (40 mg total) by mouth 2 (two) times daily before a meal. 60 tablet 0  . sucralfate (CARAFATE) 1 g tablet Take 1 tablet (1 g total) by mouth 4 (four) times daily -  with meals and at bedtime. 120 tablet 0  . traMADol (ULTRAM) 50 MG tablet Take 1 tablet (50 mg total) by mouth 3 (three) times daily as needed. 90 tablet 0   No current facility-administered medications for this visit.     Allergies as of 02/11/2017 - Review Complete 02/05/2017  Allergen Reaction Noted  . Simvastatin      ROS:  General: Negative for anorexia, weight loss, fever, chills, fatigue, weakness. ENT: Negative for hoarseness, difficulty swallowing , nasal  congestion. CV: Negative for chest pain, angina, palpitations, dyspnea on exertion, peripheral edema.  Respiratory: Negative for dyspnea at rest, dyspnea on exertion, cough, sputum, wheezing.  GI: See history of present illness. GU:  Negative for dysuria, hematuria, urinary incontinence, urinary frequency, nocturnal urination.  Endo: Negative for unusual weight change.    Physical Examination:   There were no vitals taken for this visit.  General: Well-nourished, well-developed in no acute distress.  Eyes: No icterus. Conjunctivae pink. Mouth: Oropharyngeal mucosa moist and pink , no lesions erythema or exudate. Lungs: Clear to auscultation bilaterally. Non-labored. Heart: Regular rate and rhythm, no murmurs rubs or gallops.  Abdomen: Bowel sounds are normal, nontender, nondistended, no hepatosplenomegaly or masses, no abdominal bruits or hernia , no rebound or guarding.   Extremities: No lower extremity edema. No clubbing or deformities. Neuro: Alert and oriented x 3.  Grossly intact. Skin: Warm and dry, no jaundice.   Psych: Alert and cooperative, normal mood and affect.  Labs:    Imaging Studies: No results found.  Assessment and Plan:   Kyle [name] is a 83 y.o. y/o male Who comes in after being discharged from the hospital.  The patient was in the hospital for bleeding ulcers.  The patient was also found to have esophagitis.  The patient has been told to stop his use of NSAIDs/BC powders should continue the Protonix.  The patient has been told that he can stop the Carafate.  The patient will follow up with me as needed.    Lucilla Lame, MD. Marval Regal   Note: This dictation was prepared with Dragon dictation along with smaller phrase technology. Any transcriptional errors that result from this process are unintentional.

## 2017-02-17 DIAGNOSIS — Z8719 Personal history of other diseases of the digestive system: Secondary | ICD-10-CM | POA: Diagnosis not present

## 2017-02-17 DIAGNOSIS — M436 Torticollis: Secondary | ICD-10-CM | POA: Diagnosis not present

## 2017-02-17 DIAGNOSIS — Z87448 Personal history of other diseases of urinary system: Secondary | ICD-10-CM | POA: Diagnosis not present

## 2017-02-21 ENCOUNTER — Inpatient Hospital Stay
Admission: EM | Admit: 2017-02-21 | Discharge: 2017-02-22 | DRG: 872 | Disposition: A | Discharge status: Home / Self Care | Payer: Medicare Other | Attending: Internal Medicine | Admitting: Internal Medicine

## 2017-02-21 ENCOUNTER — Emergency Department: Payer: Medicare Other

## 2017-02-21 ENCOUNTER — Encounter: Payer: Self-pay | Admitting: *Deleted

## 2017-02-21 DIAGNOSIS — A419 Sepsis, unspecified organism: Principal | ICD-10-CM | POA: Diagnosis present

## 2017-02-21 DIAGNOSIS — E785 Hyperlipidemia, unspecified: Secondary | ICD-10-CM | POA: Diagnosis present

## 2017-02-21 DIAGNOSIS — Z8546 Personal history of malignant neoplasm of prostate: Secondary | ICD-10-CM

## 2017-02-21 DIAGNOSIS — Z79899 Other long term (current) drug therapy: Secondary | ICD-10-CM

## 2017-02-21 DIAGNOSIS — I639 Cerebral infarction, unspecified: Secondary | ICD-10-CM | POA: Diagnosis not present

## 2017-02-21 DIAGNOSIS — I10 Essential (primary) hypertension: Secondary | ICD-10-CM | POA: Diagnosis not present

## 2017-02-21 DIAGNOSIS — N183 Chronic kidney disease, stage 3 (moderate): Secondary | ICD-10-CM | POA: Diagnosis present

## 2017-02-21 DIAGNOSIS — Z8249 Family history of ischemic heart disease and other diseases of the circulatory system: Secondary | ICD-10-CM | POA: Diagnosis not present

## 2017-02-21 DIAGNOSIS — I252 Old myocardial infarction: Secondary | ICD-10-CM | POA: Diagnosis not present

## 2017-02-21 DIAGNOSIS — I2510 Atherosclerotic heart disease of native coronary artery without angina pectoris: Secondary | ICD-10-CM | POA: Diagnosis present

## 2017-02-21 DIAGNOSIS — Z888 Allergy status to other drugs, medicaments and biological substances status: Secondary | ICD-10-CM

## 2017-02-21 DIAGNOSIS — M25561 Pain in right knee: Secondary | ICD-10-CM | POA: Diagnosis not present

## 2017-02-21 DIAGNOSIS — M1990 Unspecified osteoarthritis, unspecified site: Secondary | ICD-10-CM | POA: Diagnosis present

## 2017-02-21 DIAGNOSIS — R748 Abnormal levels of other serum enzymes: Secondary | ICD-10-CM | POA: Diagnosis present

## 2017-02-21 DIAGNOSIS — Z809 Family history of malignant neoplasm, unspecified: Secondary | ICD-10-CM | POA: Diagnosis not present

## 2017-02-21 DIAGNOSIS — Z87891 Personal history of nicotine dependence: Secondary | ICD-10-CM

## 2017-02-21 DIAGNOSIS — I480 Paroxysmal atrial fibrillation: Secondary | ICD-10-CM | POA: Diagnosis present

## 2017-02-21 DIAGNOSIS — I130 Hypertensive heart and chronic kidney disease with heart failure and stage 1 through stage 4 chronic kidney disease, or unspecified chronic kidney disease: Secondary | ICD-10-CM | POA: Diagnosis present

## 2017-02-21 DIAGNOSIS — M79606 Pain in leg, unspecified: Secondary | ICD-10-CM | POA: Diagnosis not present

## 2017-02-21 DIAGNOSIS — M11261 Other chondrocalcinosis, right knee: Secondary | ICD-10-CM | POA: Diagnosis not present

## 2017-02-21 DIAGNOSIS — F40240 Claustrophobia: Secondary | ICD-10-CM | POA: Diagnosis present

## 2017-02-21 DIAGNOSIS — Z7901 Long term (current) use of anticoagulants: Secondary | ICD-10-CM | POA: Diagnosis not present

## 2017-02-21 DIAGNOSIS — K210 Gastro-esophageal reflux disease with esophagitis: Secondary | ICD-10-CM | POA: Diagnosis present

## 2017-02-21 DIAGNOSIS — K219 Gastro-esophageal reflux disease without esophagitis: Secondary | ICD-10-CM | POA: Diagnosis present

## 2017-02-21 DIAGNOSIS — I255 Ischemic cardiomyopathy: Secondary | ICD-10-CM | POA: Diagnosis present

## 2017-02-21 DIAGNOSIS — E7800 Pure hypercholesterolemia, unspecified: Secondary | ICD-10-CM | POA: Diagnosis present

## 2017-02-21 DIAGNOSIS — E876 Hypokalemia: Secondary | ICD-10-CM

## 2017-02-21 DIAGNOSIS — R531 Weakness: Secondary | ICD-10-CM | POA: Diagnosis not present

## 2017-02-21 DIAGNOSIS — Z8673 Personal history of transient ischemic attack (TIA), and cerebral infarction without residual deficits: Secondary | ICD-10-CM

## 2017-02-21 DIAGNOSIS — R7989 Other specified abnormal findings of blood chemistry: Secondary | ICD-10-CM | POA: Diagnosis not present

## 2017-02-21 DIAGNOSIS — R778 Other specified abnormalities of plasma proteins: Secondary | ICD-10-CM

## 2017-02-21 DIAGNOSIS — I5022 Chronic systolic (congestive) heart failure: Secondary | ICD-10-CM | POA: Diagnosis present

## 2017-02-21 LAB — CBC WITH DIFFERENTIAL/PLATELET
Basophils Absolute: 0.1 10*3/uL (ref 0–0.1)
Basophils Relative: 1 %
Eosinophils Absolute: 0 10*3/uL (ref 0–0.7)
Eosinophils Relative: 0 %
HCT: 33.1 % — ABNORMAL LOW (ref 40.0–52.0)
Hemoglobin: 10.9 g/dL — ABNORMAL LOW (ref 13.0–18.0)
Lymphocytes Relative: 14 %
Lymphs Abs: 1.5 10*3/uL (ref 1.0–3.6)
MCH: 29.7 pg (ref 26.0–34.0)
MCHC: 33 g/dL (ref 32.0–36.0)
MCV: 90.1 fL (ref 80.0–100.0)
Monocytes Absolute: 1.6 10*3/uL — ABNORMAL HIGH (ref 0.2–1.0)
Monocytes Relative: 15 %
Neutro Abs: 7.2 10*3/uL — ABNORMAL HIGH (ref 1.4–6.5)
Neutrophils Relative %: 70 %
Platelets: 300 10*3/uL (ref 150–440)
RBC: 3.67 MIL/uL — ABNORMAL LOW (ref 4.40–5.90)
RDW: 15.3 % — ABNORMAL HIGH (ref 11.5–14.5)
WBC: 10.3 10*3/uL (ref 3.8–10.6)

## 2017-02-21 LAB — LIPASE, BLOOD: Lipase: 26 U/L (ref 11–51)

## 2017-02-21 LAB — URINALYSIS, COMPLETE (UACMP) WITH MICROSCOPIC
Bacteria, UA: NONE SEEN
Bilirubin Urine: NEGATIVE
Glucose, UA: NEGATIVE mg/dL
Ketones, ur: NEGATIVE mg/dL
Leukocytes, UA: NEGATIVE
Nitrite: NEGATIVE
Protein, ur: 30 mg/dL — AB
Specific Gravity, Urine: 1.016 (ref 1.005–1.030)
Squamous Epithelial / LPF: NONE SEEN
pH: 6 (ref 5.0–8.0)

## 2017-02-21 LAB — SYNOVIAL CELL COUNT + DIFF, W/ CRYSTALS
Lymphocytes-Synovial Fld: 0 %
Monocyte-Macrophage-Synovial Fluid: 3 %
Neutrophil, Synovial: 97 %
WBC, Synovial: 31679 /mm3 — ABNORMAL HIGH (ref 0–200)

## 2017-02-21 LAB — GLUCOSE, CAPILLARY: Glucose-Capillary: 146 mg/dL — ABNORMAL HIGH (ref 65–99)

## 2017-02-21 LAB — COMPREHENSIVE METABOLIC PANEL
ALT: 6 U/L — ABNORMAL LOW (ref 17–63)
AST: 16 U/L (ref 15–41)
Albumin: 2.5 g/dL — ABNORMAL LOW (ref 3.5–5.0)
Alkaline Phosphatase: 50 U/L (ref 38–126)
Anion gap: 7 (ref 5–15)
BUN: 14 mg/dL (ref 6–20)
CO2: 26 mmol/L (ref 22–32)
Calcium: 7.8 mg/dL — ABNORMAL LOW (ref 8.9–10.3)
Chloride: 102 mmol/L (ref 101–111)
Creatinine, Ser: 1.13 mg/dL (ref 0.61–1.24)
GFR calc Af Amer: >60 mL/min (ref >60)
GFR calc non Af Amer: 58 mL/min — ABNORMAL LOW (ref >60)
Glucose, Bld: 130 mg/dL — ABNORMAL HIGH (ref 65–99)
Potassium: 2.4 mmol/L — CL (ref 3.5–5.1)
Sodium: 135 mmol/L (ref 135–145)
Total Bilirubin: 1.1 mg/dL (ref 0.3–1.2)
Total Protein: 5.9 g/dL — ABNORMAL LOW (ref 6.5–8.1)

## 2017-02-21 LAB — INFLUENZA PANEL BY PCR (TYPE A & B)
Influenza A By PCR: NEGATIVE
Influenza B By PCR: NEGATIVE

## 2017-02-21 LAB — PROTIME-INR
INR: 3.63
INR: 1.6
Prothrombin Time: 35.9 seconds — ABNORMAL HIGH (ref 11.4–15.2)
Prothrombin Time: 18.9 seconds — ABNORMAL HIGH (ref 11.4–15.2)

## 2017-02-21 LAB — LACTIC ACID, PLASMA
Lactic Acid, Venous: 1.5 mmol/L (ref 0.5–1.9)
Lactic Acid, Venous: 1.1 mmol/L (ref 0.5–1.9)

## 2017-02-21 LAB — MAGNESIUM: Magnesium: 1.6 mg/dL — ABNORMAL LOW (ref 1.7–2.4)

## 2017-02-21 LAB — TSH: TSH: 0.615 u[IU]/mL (ref 0.350–4.500)

## 2017-02-21 LAB — SEDIMENTATION RATE: Sed Rate: 83 mm/hr — ABNORMAL HIGH (ref 0–20)

## 2017-02-21 LAB — POTASSIUM: Potassium: 3.7 mmol/L (ref 3.5–5.1)

## 2017-02-21 LAB — TROPONIN I: Troponin I: 0.08 ng/mL (ref <0.03)

## 2017-02-21 LAB — BRAIN NATRIURETIC PEPTIDE: B Natriuretic Peptide: 177 pg/mL — ABNORMAL HIGH (ref 0.0–100.0)

## 2017-02-21 LAB — PROCALCITONIN: Procalcitonin: 0.14 ng/mL

## 2017-02-21 MED ORDER — METOPROLOL TARTRATE 25 MG PO TABS
25.0000 mg | ORAL_TABLET | Freq: Two times a day (BID) | ORAL | Status: DC
  Administered 2017-02-21 – 2017-02-22 (×3): 25 mg via ORAL
  Filled 2017-02-21 (×3): qty 1

## 2017-02-21 MED ORDER — MAGNESIUM SULFATE 2 GM/50ML IV SOLN
2.0000 g | Freq: Once | INTRAVENOUS | Status: AC
  Administered 2017-02-21: 2 g via INTRAVENOUS
  Filled 2017-02-21: qty 50

## 2017-02-21 MED ORDER — MORPHINE SULFATE (PF) 4 MG/ML IV SOLN
4.0000 mg | Freq: Once | INTRAVENOUS | Status: AC
  Administered 2017-02-21: 4 mg via INTRAVENOUS
  Filled 2017-02-21: qty 1

## 2017-02-21 MED ORDER — PIPERACILLIN-TAZOBACTAM 3.375 G IVPB
3.3750 g | Freq: Three times a day (TID) | INTRAVENOUS | Status: DC
  Administered 2017-02-21 – 2017-02-22 (×3): 3.375 g via INTRAVENOUS
  Filled 2017-02-21 (×2): qty 50

## 2017-02-21 MED ORDER — SODIUM CHLORIDE 0.9 % IV SOLN
INTRAVENOUS | Status: DC
  Administered 2017-02-21 – 2017-02-22 (×3): via INTRAVENOUS

## 2017-02-21 MED ORDER — POTASSIUM CHLORIDE 20 MEQ PO PACK
80.0000 meq | PACK | Freq: Once | ORAL | Status: AC
  Administered 2017-02-21: 80 meq via ORAL
  Filled 2017-02-21: qty 4

## 2017-02-21 MED ORDER — ATORVASTATIN CALCIUM 20 MG PO TABS
80.0000 mg | ORAL_TABLET | Freq: Every day | ORAL | Status: DC
  Administered 2017-02-21: 17:00:00 80 mg via ORAL
  Filled 2017-02-21: qty 4

## 2017-02-21 MED ORDER — VANCOMYCIN HCL IN DEXTROSE 1-5 GM/200ML-% IV SOLN
1000.0000 mg | Freq: Once | INTRAVENOUS | Status: AC
  Administered 2017-02-21: 1000 mg via INTRAVENOUS
  Filled 2017-02-21: qty 200

## 2017-02-21 MED ORDER — LOSARTAN POTASSIUM 50 MG PO TABS
50.0000 mg | ORAL_TABLET | Freq: Every day | ORAL | Status: DC
  Administered 2017-02-21 – 2017-02-22 (×2): 50 mg via ORAL
  Filled 2017-02-21 (×2): qty 1

## 2017-02-21 MED ORDER — ACETAMINOPHEN 650 MG RE SUPP: 650.0000 mg | Freq: Four times a day (QID) | RECTAL | Status: DC | PRN

## 2017-02-21 MED ORDER — SUCRALFATE 1 G PO TABS
1.0000 g | ORAL_TABLET | Freq: Three times a day (TID) | ORAL | Status: DC
  Administered 2017-02-21 – 2017-02-22 (×4): 1 g via ORAL
  Filled 2017-02-21 (×4): qty 1

## 2017-02-21 MED ORDER — LIDOCAINE HCL (PF) 1 % IJ SOLN
INTRAMUSCULAR | Status: AC
  Administered 2017-02-21: 5 mL
  Filled 2017-02-21: qty 10

## 2017-02-21 MED ORDER — PIPERACILLIN-TAZOBACTAM 3.375 G IVPB 30 MIN
3.3750 g | Freq: Once | INTRAVENOUS | Status: AC
  Administered 2017-02-21: 3.375 g via INTRAVENOUS
  Filled 2017-02-21: qty 50

## 2017-02-21 MED ORDER — PANTOPRAZOLE SODIUM 40 MG PO TBEC
40.0000 mg | DELAYED_RELEASE_TABLET | Freq: Two times a day (BID) | ORAL | Status: DC
  Administered 2017-02-21 – 2017-02-22 (×2): 40 mg via ORAL
  Filled 2017-02-21 (×2): qty 1

## 2017-02-21 MED ORDER — APIXABAN 2.5 MG PO TABS
2.5000 mg | ORAL_TABLET | Freq: Two times a day (BID) | ORAL | Status: DC
  Administered 2017-02-21 – 2017-02-22 (×3): 2.5 mg via ORAL
  Filled 2017-02-21 (×3): qty 1

## 2017-02-21 MED ORDER — SODIUM CHLORIDE 0.9 % IV BOLUS (SEPSIS)
1000.0000 mL | Freq: Once | INTRAVENOUS | Status: AC
  Administered 2017-02-21: 1000 mL via INTRAVENOUS

## 2017-02-21 MED ORDER — DOCUSATE SODIUM 100 MG PO CAPS
100.0000 mg | ORAL_CAPSULE | Freq: Two times a day (BID) | ORAL | Status: DC
  Administered 2017-02-21 – 2017-02-22 (×3): 100 mg via ORAL
  Filled 2017-02-21 (×3): qty 1

## 2017-02-21 MED ORDER — ACETAMINOPHEN 500 MG PO TABS
1000.0000 mg | ORAL_TABLET | Freq: Once | ORAL | Status: AC
  Administered 2017-02-21: 1000 mg via ORAL
  Filled 2017-02-21: qty 2

## 2017-02-21 MED ORDER — TRAMADOL HCL 50 MG PO TABS: 50.0000 mg | ORAL_TABLET | Freq: Three times a day (TID) | ORAL | Status: DC | PRN

## 2017-02-21 MED ORDER — ACETAMINOPHEN 325 MG PO TABS: 650.0000 mg | ORAL_TABLET | Freq: Four times a day (QID) | ORAL | Status: DC | PRN

## 2017-02-21 MED ORDER — TIZANIDINE HCL 4 MG PO TABS
4.0000 mg | ORAL_TABLET | Freq: Three times a day (TID) | ORAL | Status: DC | PRN
  Filled 2017-02-21: qty 1

## 2017-02-21 MED ORDER — POTASSIUM CHLORIDE CRYS ER 20 MEQ PO TBCR
40.0000 meq | EXTENDED_RELEASE_TABLET | Freq: Two times a day (BID) | ORAL | Status: DC
  Administered 2017-02-21 – 2017-02-22 (×3): 40 meq via ORAL
  Filled 2017-02-21 (×3): qty 2

## 2017-02-21 MED ORDER — VANCOMYCIN HCL 10 G IV SOLR
1250.0000 mg | INTRAVENOUS | Status: DC
  Administered 2017-02-21 – 2017-02-22 (×2): 1250 mg via INTRAVENOUS
  Filled 2017-02-21 (×4): qty 1250

## 2017-02-21 MED ORDER — ONDANSETRON HCL 4 MG PO TABS: 4.0000 mg | ORAL_TABLET | Freq: Four times a day (QID) | ORAL | Status: DC | PRN

## 2017-02-21 MED ORDER — ONDANSETRON HCL 4 MG/2ML IJ SOLN: 4.0000 mg | Freq: Four times a day (QID) | INTRAMUSCULAR | Status: DC | PRN

## 2017-02-21 MED ORDER — FUROSEMIDE 20 MG PO TABS
20.0000 mg | ORAL_TABLET | Freq: Every day | ORAL | Status: DC
  Administered 2017-02-21 – 2017-02-22 (×2): 20 mg via ORAL
  Filled 2017-02-21 (×2): qty 1

## 2017-02-21 MED ORDER — METHYLPREDNISOLONE SODIUM SUCC 40 MG IJ SOLR
40.0000 mg | Freq: Every day | INTRAMUSCULAR | Status: DC
  Administered 2017-02-21 – 2017-02-22 (×2): 40 mg via INTRAVENOUS
  Filled 2017-02-21 (×2): qty 1

## 2017-02-21 NOTE — Consult Note (Signed)
ORTHOPAEDIC CONSULTATION  PATIENT NAME: Kyle [name] DOB: 1932-01-06  MRN: [id number]  REQUESTING PHYSICIAN: Loletha Grayer, MD  Chief Complaint: Right knee pain and effusion  HPI: Kyle [name] is a 83 y.o. male who complains of  right knee pain and difficulty in walking. Patient has been admitted to the hospitalist service. Patient lives independently. Patient does give history of pain in his right knee going on for last 2 weeks. Patient also has a history of limited household ambulation. According to the initial intake patient had reduced appetite recently going on for last few weeks. Patient had the right knee aspirated in the ER and the fluid has been positive for calcium pyrophosphate crystals with WBC contact of 31,000. The Gram stain has been so far negative. Patient has been started on IV antibiotics as well as IV Solu-Medrol. The orthopedic service has been consulted.   Past Medical History:  Diagnosis Date  . Arthritis   . Cancer [facility])    prostate CA  . Claustrophobia    "EXTREMELY" (03/02/2015)  . Detached retina    right  . GERD (gastroesophageal reflux disease)   . H/O dizziness   . History of prostate cancer   . Hyperlipidemia   . Hypertension   . Impaired fasting glucose   . Stroke [facility]) ~ 1995-02/2009 X 2   /notes 08/22/2010  . Urgency of urination    Past Surgical History:  Procedure Laterality Date  . CARDIAC CATHETERIZATION  ~ 1995   Archie Endo 08/22/2010  . CARDIAC CATHETERIZATION N/A 03/02/2015   Procedure: Left Heart Cath and Coronary Angiography;  Surgeon: Jettie Booze, MD;  Location: [place] CV LAB;  Service: Cardiovascular;  Laterality: N/A;  . CARDIAC CATHETERIZATION  03/02/2015   Procedure: Coronary Stent Intervention;  Surgeon: Jettie Booze, MD;  Location: [place] CV LAB;  Service: Cardiovascular;;  . COLONOSCOPY W/ POLYPECTOMY    . ESOPHAGOGASTRODUODENOSCOPY (EGD) WITH PROPOFOL N/A 01/31/2017   Procedure:  ESOPHAGOGASTRODUODENOSCOPY (EGD) WITH PROPOFOL;  Surgeon: Lucilla Lame, MD;  Location: [facility] ENDOSCOPY;  Service: Endoscopy;  Laterality: N/A;  . EYE SURGERY Right   . HERNIA REPAIR     hx UHR/notes 08/22/2010  . INGUINAL HERNIA REPAIR Left    Archie Endo 08/22/2010  . LUMBAR LAMINECTOMY/DECOMPRESSION MICRODISCECTOMY  01/24/2012   Procedure: LUMBAR LAMINECTOMY/DECOMPRESSION MICRODISCECTOMY 1 LEVEL;  Surgeon: Winfield Cunas, MD;  Location: [place] NEURO ORS;  Service: Neurosurgery;  Laterality: Right;  RIGHT Lumbar Three-Four far lateral diskectomy  . [place]   Archie Endo 08/22/2010  . RETINAL DETACHMENT REPAIR W/ SCLERAL BUCKLE LE Right 08/2006   Archie Endo 08/22/2010  . TONSILLECTOMY    . UMBILICAL HERNIA REPAIR     hx/notes 08/22/2010   Social History   Social History  . Marital status: Married    Spouse name: N/A  . Number of children: 1  . Years of education: N/A   Occupational History  . retired Retired   Social History Main Topics  . Smoking status: Never Smoker  . Smokeless tobacco: Former Systems developer    Types: Chew    Quit date: 04/22/2004  . Alcohol use No  . Drug use: No  . Sexual activity: Not Asked   Other Topics Concern  . None   Social History Narrative   Has living will   Wife is health care POA   Would accept CPR but no prolonged machines.    Would not want a feeding tube if cognitively unaware  Family History  Problem Relation Age of Onset  . Heart disease Mother   . Coronary artery disease Mother   . Heart disease Father   . Coronary artery disease Father   . Heart disease Brother   . Heart disease Brother   . Cancer Brother        prostate  . Diabetes Neg Hx    Allergies  Allergen Reactions  . Simvastatin     REACTION: myalgias   Prior to Admission medications   Medication Sig Start Date End Date Taking? Authorizing Provider  albuterol (PROVENTIL HFA;VENTOLIN HFA) 108 (90 Base) MCG/ACT inhaler Inhale 2 puffs into the lungs every 6 (six) hours as  needed for wheezing or shortness of breath. 02/01/17  Yes Sudini, Alveta Heimlich, MD  apixaban (ELIQUIS) 2.5 MG TABS tablet Take 1 tablet (2.5 mg total) by mouth 2 (two) times daily. 05/02/16  Yes Minus Breeding, MD  atorvastatin (LIPITOR) 80 MG tablet TAKE ONE TABLET EVERY DAY AT 6PM 12/12/16  Yes Minus Breeding, MD  furosemide (LASIX) 20 MG tablet Take 1 tablet (20 mg total) by mouth daily. 10/31/16  Yes Minus Breeding, MD  losartan (COZAAR) 50 MG tablet Take 1 tablet (50 mg total) by mouth daily. 04/18/16  Yes Minus Breeding, MD  pantoprazole (PROTONIX) 40 MG tablet Take 1 tablet (40 mg total) by mouth 2 (two) times daily before a meal. 02/01/17 02/01/18 Yes Sudini, Srikar, MD  tiZANidine (ZANAFLEX) 4 MG tablet Take 4 mg by mouth 3 (three) times daily as needed. 02/17/17  Yes [provider]  traMADol (ULTRAM) 50 MG tablet Take 1 tablet (50 mg total) by mouth 3 (three) times daily as needed. 02/05/17  Yes Venia Carbon, MD   Dg Chest 2 View  Result Date: 02/21/2017 CLINICAL DATA:  83 year old male with weakness. EXAM: CHEST  2 VIEW COMPARISON:  Chest radiograph dated 03/01/2015 FINDINGS: There is shallow inspiration with bibasilar atelectatic changes. Go no focal consolidation, pleural effusion, or pneumothorax. Stable cardiac silhouette. No acute osseous pathology. IMPRESSION: No focal consolidation. Electronically Signed   By: Anner Crete M.D.   On: 02/21/2017 02:44   Ct Head Wo Contrast  Result Date: 02/21/2017 CLINICAL DATA:  Focal neuro deficit greater than 6 hours. Stroke suspected. EXAM: CT HEAD WITHOUT CONTRAST TECHNIQUE: Contiguous axial images were obtained from the base of the skull through the vertex without intravenous contrast. COMPARISON:  Brain MRI 05/17/2008 FINDINGS: Brain: Cerebellar greater than cerebral atrophy. Remote infarcts with encephalomalacia in the left occipital lobe. No CT evidence of acute ischemia. No hemorrhage or subdural fluid collection. Generalized  ventricular prominence likely secondary to atrophy. The basilar cisterns are patent. Vascular: Atherosclerosis of skullbase vasculature without hyperdense vessel or abnormal calcification. Skull: No fracture or focal lesion. Sinuses/Orbits: Chronic opacification of left side of sphenoid sinus with internal high density and surrounding bony scleroses. No acute sinus inflammation. Scleral buckle on the right. Other: None. IMPRESSION: 1.  No acute intracranial abnormality. 2. Generalized atrophy.  Remote infarcts in the left occipital lobe. Electronically Signed   By: Jeb Levering M.D.   On: 02/21/2017 05:28    Positive ROS: All other systems have been reviewed and were otherwise negative with the exception of those mentioned in the HPI and as above.  Physical Exam: General: Well developed, well nourished male seen in no acute distress. HEENT: Atraumatic and normocephalic. Sclera are clear. Extraocular motion is intact. Oropharynx is clear with moist mucosa. Neck: Supple, nontender, good range of motion. No JVD or  carotid bruits. Lungs: Clear to auscultation bilaterally. Cardiovascular: Regular rate and rhythm with normal S1 and S2. No murmurs. No gallops or rubs. Pedal pulses are palpable bilaterally. Homans test is negative bilaterally. No significant pretibial or ankle edema. Abdomen: Soft, nontender, and nondistended. Bowel sounds are present. Skin: No lesions in the area of chief complaint Neurologic: Awake, alert, and oriented. Sensory function is grossly intact. Motor strength is felt to be 5 over 5 bilaterally. No clonus or tremor. Good motor coordination. Lymphatic: No axillary or cervical lymphadenopathy  MUSCULOSKELETAL: Patient has evidence of right knee effusion. There is no evidence of any redness and the knee does not feel warm to touch. Patient has painless range of motion from 5-30 of flexion. Further flexion causes some pain. Patient however does not have any pain with axial  loading.  Patient has palpable dorsalis pedis pulses.  Assessment:  83 years old male  limited household ambulator with complaints of right knee pain.  Plan:  Based on the  Clinical preentation as well as the lab  results so far it appears that patient has underlying  Osteoarthritis with superimpoed pseudogout.  I would recommend right knee x-rays to evaluate for  Pseudogout  and osteoarthritis.  Patient will need treatment with  Oral anti-inflammatory medication. If the final cultures are negatve patient can be considered fo an intra-articular Kenalog injection.  James P. Holley Bouche M.D.

## 2017-02-21 NOTE — Progress Notes (Signed)
Pharmacy Antibiotic Note  Kyle [name] is a 83 y.o. male admitted on 02/21/2017 with sepsis.  Pharmacy has been consulted for vancomycin and Zosyn dosing.  Plan: DW 97kg  vd 61L kei 0.047 hr-1  T1/2 15 hours Vancomycin 1250 mg q 18 hours ordered with stacked dosing. Level before 5th dose. Goal trough 15-20  Zosyn 3.375g IV q8h (4 hour infusion).  Height: 5\' 10"  (177.8 cm) Weight: 192 lb (87.1 kg) IBW/kg (Calculated) : 73  Temp (24hrs), Avg:99.8 F (37.7 C), Min:99.3 F (37.4 C), Max:100.3 F (37.9 C)   Recent Labs Lab 02/21/17 0230 02/21/17 0420  WBC 10.3  --   CREATININE  --  1.13  LATICACIDVEN 1.5 1.1    Estimated Creatinine Clearance: 51.1 mL/min (by C-G formula based on SCr of 1.13 mg/dL).    Allergies  Allergen Reactions  . Simvastatin     REACTION: myalgias    Antimicrobials this admission: Vancomycin, Zosyn 11/2  >>    >>   Dose adjustments this admission:   Microbiology results: 11/2 BCx: pending 11/2 UCx: pending       11/2 CXR:  No focal consoidation 11/2 UA: pending Thank you for allowing pharmacy to be a part of this patient's care.  Tarvaris Puglia S 02/21/2017 5:28 AM

## 2017-02-21 NOTE — Progress Notes (Signed)
Patient ID: Kyle [name], male   DOB: 1931/11/05, 83 y.o.   MRN: [id number]  [facility] PROGRESS NOTE  LUCA DYAR HBZ:[id number] DOB: [birth year] DOA: 02/21/2017 PCP: Venia Carbon, MD  HPI/Subjective: Patient with severe pain in his right knee.  Unable to walk and bear weight.  Had some fevers at home.  Some pain in the back of his head.  Not feeling well.  Objective: Vitals:   02/21/17 0700 02/21/17 0948  BP: (!) 143/87 (!) 150/65  Pulse: 65 60  Resp: (!) 24 19  Temp:  98.4 F (36.9 C)  SpO2: 95% 93%    Filed Weights   02/21/17 0231  Weight: 87.1 kg (192 lb)    ROS: Review of Systems  Constitutional: Positive for fever. Negative for chills.  Eyes: Negative for blurred vision.  Respiratory: Negative for cough and shortness of breath.   Cardiovascular: Negative for chest pain.  Gastrointestinal: Negative for abdominal pain, constipation, diarrhea, nausea and vomiting.  Genitourinary: Negative for dysuria.  Musculoskeletal: Positive for joint pain.  Neurological: Positive for weakness and headaches. Negative for dizziness.   Exam: Physical Exam  Constitutional: He is oriented to person, place, and time.  HENT:  Nose: No mucosal edema.  Mouth/Throat: No oropharyngeal exudate or posterior oropharyngeal edema.  Eyes: Pupils are equal, round, and reactive to light. Conjunctivae, EOM and lids are normal.  Neck: No JVD present. Carotid bruit is not present. No edema present. No thyroid mass and no thyromegaly present.  Cardiovascular: S1 normal and S2 normal.  Exam reveals no gallop.   No murmur heard. Pulses:      Dorsalis pedis pulses are 2+ on the right side, and 2+ on the left side.  Respiratory: No respiratory distress. He has no wheezes. He has no rhonchi. He has no rales.  GI: Soft. Bowel sounds are normal. There is no tenderness.  Musculoskeletal:       Right knee: He exhibits decreased range of motion and swelling.       Left knee: He exhibits swelling.        Right ankle: He exhibits swelling.       Left ankle: He exhibits swelling.  Lymphadenopathy:    He has no cervical adenopathy.  Neurological: He is alert and oriented to person, place, and time. No cranial nerve deficit.  Skin: Skin is warm. No rash noted. Nails show no clubbing.  Psychiatric: He has a normal mood and affect.      Data Reviewed: Basic Metabolic Panel:  Recent Labs Lab 02/21/17 0420  NA 135  K 2.4*  CL 102  CO2 26  GLUCOSE 130*  BUN 14  CREATININE 1.13  CALCIUM 7.8*  MG 1.6*   Liver Function Tests:  Recent Labs Lab 02/21/17 0420  AST 16  ALT 6*  ALKPHOS 50  BILITOT 1.1  PROT 5.9*  ALBUMIN 2.5*    Recent Labs Lab 02/21/17 0420  LIPASE 26   CBC:  Recent Labs Lab 02/21/17 0230  WBC 10.3  NEUTROABS 7.2*  HGB 10.9*  HCT 33.1*  MCV 90.1  PLT 300   Cardiac Enzymes:  Recent Labs Lab 02/21/17 0420  TROPONINI 0.08*   BNP (last 3 results)  Recent Labs  02/21/17 0313  BNP 177.0*      Recent Results (from the past 240 hour(s))  Culture, blood (Routine x 2)     Status: None (Preliminary result)   Collection Time: 02/21/17  2:30 AM  Result Value Ref Range Status  Specimen Description BLOOD LEFT ANTECUBITAL  Final   Special Requests   Final    BOTTLES DRAWN AEROBIC AND ANAEROBIC Blood Culture results may not be optimal due to an excessive volume of blood received in culture bottles   Culture NO GROWTH < 12 HOURS  Final   Report Status PENDING  Incomplete  Culture, blood (Routine x 2)     Status: None (Preliminary result)   Collection Time: 02/21/17  2:31 AM  Result Value Ref Range Status   Specimen Description BLOOD BLOOD LEFT FOREARM  Final   Special Requests   Final    BOTTLES DRAWN AEROBIC AND ANAEROBIC Blood Culture adequate volume   Culture NO GROWTH < 12 HOURS  Final   Report Status PENDING  Incomplete     Studies: Dg Chest 2 View  Result Date: 02/21/2017 CLINICAL DATA:  83 year old male with weakness. EXAM:  CHEST  2 VIEW COMPARISON:  Chest radiograph dated 03/01/2015 FINDINGS: There is shallow inspiration with bibasilar atelectatic changes. Go no focal consolidation, pleural effusion, or pneumothorax. Stable cardiac silhouette. No acute osseous pathology. IMPRESSION: No focal consolidation. Electronically Signed   By: Anner Crete M.D.   On: 02/21/2017 02:44   Ct Head Wo Contrast  Result Date: 02/21/2017 CLINICAL DATA:  Focal neuro deficit greater than 6 hours. Stroke suspected. EXAM: CT HEAD WITHOUT CONTRAST TECHNIQUE: Contiguous axial images were obtained from the base of the skull through the vertex without intravenous contrast. COMPARISON:  Brain MRI 05/17/2008 FINDINGS: Brain: Cerebellar greater than cerebral atrophy. Remote infarcts with encephalomalacia in the left occipital lobe. No CT evidence of acute ischemia. No hemorrhage or subdural fluid collection. Generalized ventricular prominence likely secondary to atrophy. The basilar cisterns are patent. Vascular: Atherosclerosis of skullbase vasculature without hyperdense vessel or abnormal calcification. Skull: No fracture or focal lesion. Sinuses/Orbits: Chronic opacification of left side of sphenoid sinus with internal high density and surrounding bony scleroses. No acute sinus inflammation. Scleral buckle on the right. Other: None. IMPRESSION: 1.  No acute intracranial abnormality. 2. Generalized atrophy.  Remote infarcts in the left occipital lobe. Electronically Signed   By: Jeb Levering M.D.   On: 02/21/2017 05:28    Scheduled Meds: . apixaban  2.5 mg Oral BID  . atorvastatin  80 mg Oral q1800  . docusate sodium  100 mg Oral BID  . furosemide  20 mg Oral Daily  . losartan  50 mg Oral Daily  . methylPREDNISolone (SOLU-MEDROL) injection  40 mg Intravenous Daily  . metoprolol tartrate  25 mg Oral BID  . pantoprazole  40 mg Oral BID AC  . potassium chloride  40 mEq Oral BID  . sucralfate  1 g Oral TID WC & HS   Continuous  Infusions: . sodium chloride 125 mL/hr at 02/21/17 1012  . piperacillin-tazobactam (ZOSYN)  IV 3.375 g (02/21/17 1214)  . vancomycin 1,250 mg (02/21/17 1214)    Assessment/Plan:  1. Pseudogout right knee.  Start IV Solu-Medrol.  Case discussed with orthopedic surgery to see the patient.  Continue antibiotics until cultures are back on his knee. Sepsis ruled out. 2. Hypokalemia.  Patient received 80 mEq of potassium.  We will give 40 mEq twice daily recheck potassium later on today. 3. Essential hypertension on losartan and metoprolol 4. Hyperlipidemia unspecified on atorvastatin  5. history of arrhythmia with stroke on Eliquis 6. GERD on Protonix  Code Status:     Code Status Orders        Start  Ordered   02/21/17 0955  Full code  Continuous     02/21/17 0954    Code Status History    Date Active Date Inactive Code Status Order ID Comments User Context   01/30/2017 11:43 AM 02/01/2017  2:35 PM Full Code [id number]  Hillary Bow, MD ED   03/02/2015 11:26 AM 03/03/2015  6:11 PM Full Code [id number]  Jettie Booze, MD Inpatient   02/27/2015  9:00 AM 03/02/2015 11:26 AM Full Code [id number]  Karen Kitchens Inpatient   05/10/2014 10:34 PM 05/15/2014  3:45 PM Full Code [id number]  Allyne Gee, MD ED    Advance Directive Documentation     Most Recent Value  Type of Advance Directive  Healthcare Power of Attorney, Living will  Pre-existing out of facility DNR order (yellow form or pink MOST form)  -  "MOST" Form in Place?  -     Disposition Plan: We will need to walk better in order to  go home.  Consultants:  Orthopedic surgery  Procedures:  Knee aspiration done by ER physician  Antibiotics:  Vancomycin  Zosyn  Time spent: 35 minutes  [place]

## 2017-02-21 NOTE — ED Provider Notes (Signed)
 -----------------------------------------   9:13 AM on 02/21/2017 -----------------------------------------  Pt was examined by hospitalist Dr. Marcille Blanco who found that pt has a swollen tender right knee. He requested I perform arthrocentesis. I examined the patient myself and did find that the right knee is warm to the touch, swollen, very painful with even slight range of motion, concerning for septic arthritis. I obtained informed consent from the patient, signed form is on the chart, and arthrocentesis was completed, withdrawing 110 ML's of yellow green synovial fluid.  ARTHOCENTESIS Performed by: Carrie Mew Consent: Verbal consent obtained. Risks and benefits: risks, benefits and alternatives were discussed Consent given by: patient Required items: required blood products, implants, devices, and special equipment available Patient identity confirmed: verbally with patient Time out: Immediately prior to procedure a "time out" was called to verify the correct patient, procedure, equipment, support staff and site/side marked as required. Indications: suspected septic arthritis  Joint: right knee Local anesthesia used: lidocaine 1% without epi, 2 mL total Preparation: Patient was prepped and draped in the usual sterile fashion. Aspirate appearance: yellow-green, murky Aspirate amount: 110 ml Patient tolerance: Patient tolerated the procedure well with no immediate complications.   Further care by hospitalist.  Synovial fluid labs ordered including gram stain/culture, uric acid, protein/glucose.  Final diagnoses:  Sepsis, due to unspecified organism ([place])  Elevated troponin  Hypokalemia        Carrie Mew, MD 02/21/17 [phone]

## 2017-02-21 NOTE — H&P (Signed)
Kyle [name] is an 83 y.o. male.   Chief Complaint: Weakness HPI: The patient with past medical history of hypertension, arthritis and prostate cancer presents emergency department complaining of weakness.  The patient states that his legs hurt and make it difficult for him to walk.  He specifically states that his knees are painful.  The symptoms have worsened while the patient has also had a decreased appetite for the last 3 weeks as well as subjective fevers including night sweats and chills.  In the emergency department he met criteria for sepsis which prompted initiation of sepsis protocol.  Initially, a source was not found however upon physical exam it seems the patient may have an infected knee.  The patient is received antibiotics as well as fluid resuscitation but I have asked the emergency department to perform an arthrocentesis.  The patient is stable and I have admitted him to the medical floor for further management.  Past Medical History:  Diagnosis Date  . Arthritis   . Cancer [facility])    prostate CA  . Claustrophobia    "EXTREMELY" (03/02/2015)  . Detached retina    right  . GERD (gastroesophageal reflux disease)   . H/O dizziness   . History of prostate cancer   . Hyperlipidemia   . Hypertension   . Impaired fasting glucose   . Stroke [facility]) ~ 1995-02/2009 X 2   /notes 08/22/2010  . Urgency of urination     Past Surgical History:  Procedure Laterality Date  . CARDIAC CATHETERIZATION  ~ 1995   Archie Endo 08/22/2010  . CARDIAC CATHETERIZATION N/A 03/02/2015   Procedure: Left Heart Cath and Coronary Angiography;  Surgeon: Jettie Booze, MD;  Location: [facility];  Service: Cardiovascular;  Laterality: N/A;  . CARDIAC CATHETERIZATION  03/02/2015   Procedure: Coronary Stent Intervention;  Surgeon: Jettie Booze, MD;  Location: [place] [facility];  Service: Cardiovascular;;  . COLONOSCOPY W/ POLYPECTOMY    . ESOPHAGOGASTRODUODENOSCOPY (EGD) WITH PROPOFOL N/A  01/31/2017   Procedure: ESOPHAGOGASTRODUODENOSCOPY (EGD) WITH PROPOFOL;  Surgeon: Lucilla Lame, MD;  Location: [facility] ENDOSCOPY;  Service: Endoscopy;  Laterality: N/A;  . EYE SURGERY Right   . HERNIA REPAIR     hx UHR/notes 08/22/2010  . INGUINAL HERNIA REPAIR Left    Archie Endo 08/22/2010  . LUMBAR LAMINECTOMY/DECOMPRESSION MICRODISCECTOMY  01/24/2012   Procedure: LUMBAR LAMINECTOMY/DECOMPRESSION MICRODISCECTOMY 1 LEVEL;  Surgeon: Winfield Cunas, MD;  Location: [facility] NEURO ORS;  Service: Neurosurgery;  Laterality: Right;  RIGHT Lumbar Three-Four far lateral diskectomy  . [place]   Archie Endo 08/22/2010  . RETINAL DETACHMENT REPAIR W/ SCLERAL BUCKLE LE Right 08/2006   Archie Endo 08/22/2010  . TONSILLECTOMY    . UMBILICAL HERNIA REPAIR     hx/notes 08/22/2010    Family History  Problem Relation Age of Onset  . Heart disease Mother   . Coronary artery disease Mother   . Heart disease Father   . Coronary artery disease Father   . Heart disease Brother   . Heart disease Brother   . Cancer Brother        prostate  . Diabetes Neg Hx    Social History:  reports that he has never smoked. He quit smokeless tobacco use about 12 years ago. His smokeless tobacco use included Chew. He reports that he does not drink alcohol or use drugs.  Allergies:  Allergies  Allergen Reactions  . Simvastatin     REACTION: myalgias     (Not  in a hospital admission)  Results for orders placed or performed during the hospital encounter of 02/21/17 (from the past 48 hour(s))  Lactic acid, plasma     Status: None   Collection Time: 02/21/17  2:30 AM  Result Value Ref Range   Lactic Acid, Venous 1.5 0.5 - 1.9 mmol/L  CBC with Differential     Status: Abnormal   Collection Time: 02/21/17  2:30 AM  Result Value Ref Range   WBC 10.3 3.8 - 10.6 K/uL   RBC 3.67 (L) 4.40 - 5.90 MIL/uL   Hemoglobin 10.9 (L) 13.0 - 18.0 g/dL   HCT 33.1 (L) 40.0 - 52.0 %   MCV 90.1 80.0 - 100.0 fL   MCH 29.7 26.0 - 34.0 pg   MCHC  33.0 32.0 - 36.0 g/dL   RDW 15.3 (H) 11.5 - 14.5 %   Platelets 300 150 - 440 K/uL   Neutrophils Relative % 70 %   Neutro Abs 7.2 (H) 1.4 - 6.5 K/uL   Lymphocytes Relative 14 %   Lymphs Abs 1.5 1.0 - 3.6 K/uL   Monocytes Relative 15 %   Monocytes Absolute 1.6 (H) 0.2 - 1.0 K/uL   Eosinophils Relative 0 %   Eosinophils Absolute 0.0 0 - 0.7 K/uL   Basophils Relative 1 %   Basophils Absolute 0.1 0 - 0.1 K/uL  Culture, blood (Routine x 2)     Status: None (Preliminary result)   Collection Time: 02/21/17  2:30 AM  Result Value Ref Range   Specimen Description BLOOD LEFT ANTECUBITAL    Special Requests      BOTTLES DRAWN AEROBIC AND ANAEROBIC Blood Culture results may not be optimal due to an excessive volume of blood received in culture bottles   Culture NO GROWTH < 12 HOURS    Report Status PENDING   Culture, blood (Routine x 2)     Status: None (Preliminary result)   Collection Time: 02/21/17  2:31 AM  Result Value Ref Range   Specimen Description BLOOD BLOOD LEFT FOREARM    Special Requests      BOTTLES DRAWN AEROBIC AND ANAEROBIC Blood Culture adequate volume   Culture NO GROWTH < 12 HOURS    Report Status PENDING   Urinalysis, Complete w Microscopic     Status: Abnormal   Collection Time: 02/21/17  3:12 AM  Result Value Ref Range   Color, Urine YELLOW (A) YELLOW   APPearance CLEAR (A) CLEAR   Specific Gravity, Urine 1.016 1.005 - 1.030   pH 6.0 5.0 - 8.0   Glucose, UA NEGATIVE NEGATIVE mg/dL   Hgb urine dipstick SMALL (A) NEGATIVE   Bilirubin Urine NEGATIVE NEGATIVE   Ketones, ur NEGATIVE NEGATIVE mg/dL   Protein, ur 30 (A) NEGATIVE mg/dL   Nitrite NEGATIVE NEGATIVE   Leukocytes, UA NEGATIVE NEGATIVE   RBC / HPF 0-5 0 - 5 RBC/hpf   WBC, UA 0-5 0 - 5 WBC/hpf   Bacteria, UA NONE SEEN NONE SEEN   Squamous Epithelial / LPF NONE SEEN NONE SEEN   Mucus PRESENT    Hyaline Casts, UA PRESENT   Influenza panel by PCR (type A & B)     Status: None   Collection Time: 02/21/17   3:12 AM  Result Value Ref Range   Influenza A By PCR NEGATIVE NEGATIVE   Influenza B By PCR NEGATIVE NEGATIVE    Comment: (NOTE) The Xpert Xpress Flu assay is intended as an aid in the diagnosis of  influenza and  should not be used as a sole basis for treatment.  This  assay is FDA approved for nasopharyngeal swab specimens only. Nasal  washings and aspirates are unacceptable for Xpert Xpress Flu testing.   TSH     Status: None   Collection Time: 02/21/17  3:13 AM  Result Value Ref Range   TSH 0.615 0.350 - 4.500 uIU/mL    Comment: Performed by a 3rd Generation assay with a functional sensitivity of <=0.01 uIU/mL.  Brain natriuretic peptide     Status: Abnormal   Collection Time: 02/21/17  3:13 AM  Result Value Ref Range   B Natriuretic Peptide 177.0 (H) 0.0 - 100.0 pg/mL  Protime-INR     Status: Abnormal   Collection Time: 02/21/17  3:13 AM  Result Value Ref Range   Prothrombin Time 35.9 (H) 11.4 - 15.2 seconds   INR 3.63   Lactic acid, plasma     Status: None   Collection Time: 02/21/17  4:20 AM  Result Value Ref Range   Lactic Acid, Venous 1.1 0.5 - 1.9 mmol/L  Comprehensive metabolic panel     Status: Abnormal   Collection Time: 02/21/17  4:20 AM  Result Value Ref Range   Sodium 135 135 - 145 mmol/L   Potassium 2.4 (LL) 3.5 - 5.1 mmol/L    Comment: CRITICAL RESULT CALLED TO, READ BACK BY AND VERIFIED WITH JENNIFER DALEY AT 0504 02/21/17 ALV    Chloride 102 101 - 111 mmol/L   CO2 26 22 - 32 mmol/L   Glucose, Bld 130 (H) 65 - 99 mg/dL   BUN 14 6 - 20 mg/dL   Creatinine, Ser 1.13 0.61 - 1.24 mg/dL   Calcium 7.8 (L) 8.9 - 10.3 mg/dL   Total Protein 5.9 (L) 6.5 - 8.1 g/dL   Albumin 2.5 (L) 3.5 - 5.0 g/dL   AST 16 15 - 41 U/L   ALT 6 (L) 17 - 63 U/L   Alkaline Phosphatase 50 38 - 126 U/L   Total Bilirubin 1.1 0.3 - 1.2 mg/dL   GFR calc non Af Amer 58 (L) >60 mL/min   GFR calc Af Amer >60 >60 mL/min    Comment: (NOTE) The eGFR has been calculated using the CKD EPI  equation. This calculation has not been validated in all clinical situations. eGFR's persistently <60 mL/min signify possible Chronic Kidney Disease.    Anion gap 7 5 - 15  Lipase, blood     Status: None   Collection Time: 02/21/17  4:20 AM  Result Value Ref Range   Lipase 26 11 - 51 U/L  Procalcitonin     Status: None   Collection Time: 02/21/17  4:20 AM  Result Value Ref Range   Procalcitonin 0.14 ng/mL    Comment:        Interpretation: PCT (Procalcitonin) <= 0.5 ng/mL: Systemic infection (sepsis) is not likely. Local bacterial infection is possible. (NOTE)         ICU PCT Algorithm               Non ICU PCT Algorithm    ----------------------------     ------------------------------         PCT < 0.25 ng/mL                 PCT < 0.1 ng/mL     Stopping of antibiotics            Stopping of antibiotics       strongly encouraged.  strongly encouraged.    ----------------------------     ------------------------------       PCT level decrease by               PCT < 0.25 ng/mL       >= 80% from peak PCT       OR PCT 0.25 - 0.5 ng/mL          Stopping of antibiotics                                             encouraged.     Stopping of antibiotics           encouraged.    ----------------------------     ------------------------------       PCT level decrease by              PCT >= 0.25 ng/mL       < 80% from peak PCT        AND PCT >= 0.5 ng/mL            Continuin g antibiotics                                              encouraged.       Continuing antibiotics            encouraged.    ----------------------------     ------------------------------     PCT level increase compared          PCT > 0.5 ng/mL         with peak PCT AND          PCT >= 0.5 ng/mL             Escalation of antibiotics                                          strongly encouraged.      Escalation of antibiotics        strongly encouraged.   Troponin I     Status: Abnormal    Collection Time: 02/21/17  4:20 AM  Result Value Ref Range   Troponin I 0.08 (HH) <0.03 ng/mL    Comment: CRITICAL RESULT CALLED TO, READ BACK BY AND VERIFIED WITH JENNIFER DALEY AT 0504 02/21/17 ALV   Magnesium     Status: Abnormal   Collection Time: 02/21/17  4:20 AM  Result Value Ref Range   Magnesium 1.6 (L) 1.7 - 2.4 mg/dL   Dg Chest 2 View  Result Date: 02/21/2017 CLINICAL DATA:  83 year old male with weakness. EXAM: CHEST  2 VIEW COMPARISON:  Chest radiograph dated 03/01/2015 FINDINGS: There is shallow inspiration with bibasilar atelectatic changes. Go no focal consolidation, pleural effusion, or pneumothorax. Stable cardiac silhouette. No acute osseous pathology. IMPRESSION: No focal consolidation. Electronically Signed   By: Anner Crete M.D.   On: 02/21/2017 02:44   Ct Head Wo Contrast  Result Date: 02/21/2017 CLINICAL DATA:  Focal neuro deficit greater than 6 hours. Stroke suspected. EXAM: CT HEAD WITHOUT CONTRAST TECHNIQUE: Contiguous axial images were obtained from the base of the skull through the  vertex without intravenous contrast. COMPARISON:  Brain MRI 05/17/2008 FINDINGS: Brain: Cerebellar greater than cerebral atrophy. Remote infarcts with encephalomalacia in the left occipital lobe. No CT evidence of acute ischemia. No hemorrhage or subdural fluid collection. Generalized ventricular prominence likely secondary to atrophy. The basilar cisterns are patent. Vascular: Atherosclerosis of skullbase vasculature without hyperdense vessel or abnormal calcification. Skull: No fracture or focal lesion. Sinuses/Orbits: Chronic opacification of left side of sphenoid sinus with internal high density and surrounding bony scleroses. No acute sinus inflammation. Scleral buckle on the right. Other: None. IMPRESSION: 1.  No acute intracranial abnormality. 2. Generalized atrophy.  Remote infarcts in the left occipital lobe. Electronically Signed   By: Jeb Levering M.D.   On: 02/21/2017 05:28     Review of Systems  Constitutional: Positive for malaise/fatigue. Negative for chills and fever.  HENT: Negative for sore throat and tinnitus.   Eyes: Negative for blurred vision and redness.  Respiratory: Negative for cough and shortness of breath.   Cardiovascular: Negative for chest pain, palpitations, orthopnea and PND.  Gastrointestinal: Negative for abdominal pain, diarrhea, nausea and vomiting.  Genitourinary: Negative for dysuria, frequency and urgency.  Musculoskeletal: Positive for joint pain. Negative for myalgias.  Skin: Negative for rash.       No lesions  Neurological: Negative for speech change, focal weakness and weakness.  Endo/Heme/Allergies: Does not bruise/bleed easily.       No temperature intolerance  Psychiatric/Behavioral: Negative for depression and suicidal ideas.    Blood pressure (!) 143/87, pulse 65, temperature 98.9 F (37.2 C), temperature source Oral, resp. rate (!) 24, height 5' 10" (1.778 m), weight 87.1 kg (192 lb), SpO2 95 %. Physical Exam  Vitals reviewed. Constitutional: He is oriented to person, place, and time. He appears well-developed and well-nourished. No distress.  HENT:  Head: Normocephalic and atraumatic.  Mouth/Throat: Oropharynx is clear and moist. No oropharyngeal exudate.  Eyes: Pupils are equal, round, and reactive to light. Conjunctivae and EOM are normal. No scleral icterus.  Neck: Normal range of motion. Neck supple. No JVD present. No tracheal deviation present. No thyromegaly present.  Cardiovascular: Normal rate, regular rhythm and normal heart sounds.  Exam reveals no gallop and no friction rub.   No murmur heard. Respiratory: Effort normal and breath sounds normal. No respiratory distress.  GI: Soft. Bowel sounds are normal. He exhibits no distension.  Genitourinary:  Genitourinary Comments: Deferred  Musculoskeletal: Normal range of motion. He exhibits no edema.  Lymphadenopathy:    He has no cervical adenopathy.   Neurological: He is alert and oriented to person, place, and time. No cranial nerve deficit.  Skin: Skin is warm and dry. No rash noted. No erythema.  Psychiatric: He has a normal mood and affect. His behavior is normal. Judgment and thought content normal.     Assessment/Plan This is an 83 year old male admitted for sepsis. 1.  Sepsis: The patient meets criteria via tachypnea and reported fever.  He received vancomycin and Zosyn in the emergency department.  He is hemodynamically stable.  Follow cultures for growth and sensitivities 2.  Hypokalemia: Replete potassium. 3.  Essential hypertension: Controlled for age; continue metoprolol and losartan 4.  Hyperlipidemia: Continue statin therapy 5.  History of stroke: Continue Eliquis 6.  DVT prophylaxis: SCDs 7.  GI prophylaxis: Pantoprazole per home regimen The patient is a full code.  Time spent on admission orders and patient care approximately 45 minutes  Harrie Foreman, MD 02/21/2017, 8:19 AM

## 2017-02-21 NOTE — ED Notes (Signed)
Call from lab to recollect phlebotomy samples. Redrawn by charge nurse from new IV site. Lab will come up to draw labs

## 2017-02-21 NOTE — ED Triage Notes (Signed)
Per EMS and pt he was home and increased swelling to his legs and increase loss of mobility. He said he was unable to get up to the bathroom and this has been going on for about 2 weeks

## 2017-02-21 NOTE — ED Provider Notes (Signed)
[facility] Emergency Department Provider Note  ____________________________________________   First MD Initiated Contact with Patient 02/21/17 [phone]     (approximate)  I have reviewed the triage vital signs and the nursing notes.   HISTORY  Chief Complaint Weakness   HPI Kyle [name] is a 83 y.o. male who comes to the emergency department with roughly 1 week of malaise, chills, fatigue, and weakness.  He said that earlier this week he went to an urgent care and "they told me there is nothing they can do for me".  He denies taking antibiotics.  He denies cough or shortness of breath.  He said he came to the emergency department tonight because he attempted to get up out of bed and felt that his legs were weaker than usual and he was unable to walk.  He also reports 1-2 weeks of bilateral lower extremity swelling.  His symptoms have been insidious in onset and gradually progressive.  There are now moderate to severe.  They are worsened when attempting to ambulate and somewhat improved with rest.  Past Medical History:  Diagnosis Date  . Arthritis   . Cancer [facility])    prostate CA  . Claustrophobia    "EXTREMELY" (03/02/2015)  . Detached retina    right  . GERD (gastroesophageal reflux disease)   . H/O dizziness   . History of prostate cancer   . Hyperlipidemia   . Hypertension   . Impaired fasting glucose   . Stroke [facility]) ~ 1995-02/2009 X 2   /notes 08/22/2010  . Urgency of urination     Patient Active Problem List   Diagnosis Date Noted  . Pseudophakia of both eyes 02/11/2017  . Reflux esophagitis 02/05/2017  . Acute peptic ulcer of stomach   . Fatigue 10/26/2016  . H/O malignant neoplasm of prostate 07/24/2015  . Hypercholesterolemia 07/24/2015  . 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 Pseudoaphakia 07/24/2015  . Retinal detachment with break 07/24/2015  . Non-STEMI (non-ST elevated myocardial infarction) ([place])   . Stented coronary  artery   . NSVT (nonsustained ventricular tachycardia) ([place])   . Coronary artery disease involving native coronary artery of native heart with angina pectoris ([place])   . Ischemic cardiomyopathy   . Paroxysmal atrial fibrillation (HCC)   . Chronic systolic congestive heart failure, NYHA class 2 ([place]) 02/27/2015  . CKD (chronic kidney disease), stage III ([place]) 02/27/2015  . Advanced directives, counseling/discussion 04/20/2014  . Central retinal edema, cystoid 07/28/2013  . Mild cognitive impairment 04/02/2011  . Routine general medical examination at a health care facility 10/10/2010  . Hyperlipemia 09/17/2006  . Essential hypertension, benign 09/17/2006  . Osteoarthritis, multiple sites 09/17/2006    Past Surgical History:  Procedure Laterality Date  . CARDIAC CATHETERIZATION  ~ 1995   Archie Endo 08/22/2010  . CARDIAC CATHETERIZATION N/A 03/02/2015   Procedure: Left Heart Cath and Coronary Angiography;  Surgeon: Jettie Booze, MD;  Location: [place] [facility];  Service: Cardiovascular;  Laterality: N/A;  . CARDIAC CATHETERIZATION  03/02/2015   Procedure: Coronary Stent Intervention;  Surgeon: Jettie Booze, MD;  Location: [place] [facility];  Service: Cardiovascular;;  . COLONOSCOPY W/ POLYPECTOMY    . ESOPHAGOGASTRODUODENOSCOPY (EGD) WITH PROPOFOL N/A 01/31/2017   Procedure: ESOPHAGOGASTRODUODENOSCOPY (EGD) WITH PROPOFOL;  Surgeon: Lucilla Lame, MD;  Location: [facility] ENDOSCOPY;  Service: Endoscopy;  Laterality: N/A;  . EYE SURGERY Right   . HERNIA REPAIR     hx UHR/notes 08/22/2010  . INGUINAL HERNIA REPAIR Left    /  notes 08/22/2010  . LUMBAR LAMINECTOMY/DECOMPRESSION MICRODISCECTOMY  01/24/2012   Procedure: LUMBAR LAMINECTOMY/DECOMPRESSION MICRODISCECTOMY 1 LEVEL;  Surgeon: Winfield Cunas, MD;  Location: [place] NEURO ORS;  Service: Neurosurgery;  Laterality: Right;  RIGHT Lumbar Three-Four far lateral diskectomy  . [place]   Archie Endo 08/22/2010  . RETINAL DETACHMENT REPAIR  W/ SCLERAL BUCKLE LE Right 08/2006   Archie Endo 08/22/2010  . TONSILLECTOMY    . UMBILICAL HERNIA REPAIR     hx/notes 08/22/2010    Prior to Admission medications   Medication Sig Start Date End Date Taking? Authorizing Provider  albuterol (PROVENTIL HFA;VENTOLIN HFA) 108 (90 Base) MCG/ACT inhaler Inhale 2 puffs into the lungs every 6 (six) hours as needed for wheezing or shortness of breath. 02/01/17  Yes Sudini, Alveta Heimlich, MD  apixaban (ELIQUIS) 2.5 MG TABS tablet Take 1 tablet (2.5 mg total) by mouth 2 (two) times daily. 05/02/16  Yes Minus Breeding, MD  atorvastatin (LIPITOR) 80 MG tablet TAKE ONE TABLET EVERY DAY AT 6PM 12/12/16  Yes Minus Breeding, MD  furosemide (LASIX) 20 MG tablet Take 1 tablet (20 mg total) by mouth daily. 10/31/16  Yes Minus Breeding, MD  losartan (COZAAR) 50 MG tablet Take 1 tablet (50 mg total) by mouth daily. 04/18/16  Yes Minus Breeding, MD  metoprolol tartrate (LOPRESSOR) 25 MG tablet Take 25 mg by mouth 2 (two) times daily.    Yes [provider]  pantoprazole (PROTONIX) 40 MG tablet Take 1 tablet (40 mg total) by mouth 2 (two) times daily before a meal. 02/01/17 02/01/18 Yes Sudini, Srikar, MD  sucralfate (CARAFATE) 1 g tablet Take 1 tablet (1 g total) by mouth 4 (four) times daily -  with meals and at bedtime. 02/01/17  Yes Sudini, Alveta Heimlich, MD  tiZANidine (ZANAFLEX) 4 MG tablet Take 4 mg by mouth 3 (three) times daily as needed. 02/17/17  Yes [provider]  traMADol (ULTRAM) 50 MG tablet Take 1 tablet (50 mg total) by mouth 3 (three) times daily as needed. 02/05/17  Yes Venia Carbon, MD    Allergies Simvastatin  Family History  Problem Relation Age of Onset  . Heart disease Mother   . Coronary artery disease Mother   . Heart disease Father   . Coronary artery disease Father   . Heart disease Brother   . Heart disease Brother   . Cancer Brother        prostate  . Diabetes Neg Hx     Social History Social History  Substance Use  Topics  . Smoking status: Never Smoker  . Smokeless tobacco: Former Systems developer    Types: Chew    Quit date: 04/22/2004  . Alcohol use No    Review of Systems Constitutional: Positive fevers and chills Eyes: No visual changes. ENT: No sore throat. Cardiovascular: Denies chest pain. Respiratory: Denies shortness of breath. Gastrointestinal: No abdominal pain.  No nausea, no vomiting.  No diarrhea.  No constipation. Genitourinary: Negative for dysuria. Musculoskeletal: Negative for back pain. Skin: Negative for rash. Neurological: Negative for headaches, focal weakness or numbness.   ____________________________________________   PHYSICAL EXAM:  VITAL SIGNS: ED Triage Vitals  Enc Vitals Group     BP      Pulse      Resp      Temp      Temp src      SpO2      Weight      Height      Head Circumference  Peak Flow      Pain Score      Pain Loc      Pain Edu?      Excl. in [place]?     Constitutional: Alert and oriented x4 with active rigors appears uncomfortable Eyes: PERRL EOMI. Head: Atraumatic. Nose: No congestion/rhinnorhea. Mouth/Throat: No trismus Neck: No stridor.   Cardiovascular: Normal rate, regular rhythm. Grossly normal heart sounds.  Good peripheral circulation. Respiratory: Increased respiratory effort.  No retractions. Lungs CTAB and moving good air Gastrointestinal: Soft nontender Musculoskeletal: 3+ pitting edema to mid thighs bilaterally legs are equal in size Neurologic:  Normal speech and language. No gross focal neurologic deficits are appreciated. Skin: Spider angiomata across upper chest Psychiatric: Mood and affect are normal. Speech and behavior are normal.    ____________________________________________   DIFFERENTIAL includes but not limited to  Sepsis, urinary tract infection, pyelonephritis, pneumonia, hypothyroidism ____________________________________________   LABS (all labs ordered are listed, but only abnormal results are  displayed)  Labs Reviewed  CBC WITH DIFFERENTIAL/PLATELET - Abnormal; Notable for the following:       Result Value   RBC 3.67 (*)    Hemoglobin 10.9 (*)    HCT 33.1 (*)    RDW 15.3 (*)    Neutro Abs 7.2 (*)    Monocytes Absolute 1.6 (*)    All other components within normal limits  URINALYSIS, COMPLETE (UACMP) WITH MICROSCOPIC - Abnormal; Notable for the following:    Color, Urine YELLOW (*)    APPearance CLEAR (*)    Hgb urine dipstick SMALL (*)    Protein, ur 30 (*)    All other components within normal limits  BRAIN NATRIURETIC PEPTIDE - Abnormal; Notable for the following:    B Natriuretic Peptide 177.0 (*)    All other components within normal limits  PROTIME-INR - Abnormal; Notable for the following:    Prothrombin Time 35.9 (*)    All other components within normal limits  COMPREHENSIVE METABOLIC PANEL - Abnormal; Notable for the following:    Potassium 2.4 (*)    Glucose, Bld 130 (*)    Calcium 7.8 (*)    Total Protein 5.9 (*)    Albumin 2.5 (*)    ALT 6 (*)    GFR calc non Af Amer 58 (*)    All other components within normal limits  TROPONIN I - Abnormal; Notable for the following:    Troponin I 0.08 (*)    All other components within normal limits  CULTURE, BLOOD (ROUTINE X 2)  CULTURE, BLOOD (ROUTINE X 2)  URINE CULTURE  LACTIC ACID, PLASMA  LACTIC ACID, PLASMA  TSH  INFLUENZA PANEL BY PCR (TYPE A & B)  LIPASE, BLOOD  PROCALCITONIN    Blood work reviewed by me shows a number of abnormalities which are concerning.  He has significant hypokalemia as well as significant hypoalbuminemia and elevated INR which are concerning for liver failure. __________________________________________  EKG   ____________________________________________  RADIOLOGY  Chest x-ray reviewed by me with no acute disease Head CT reviewed by me with no acute disease ____________________________________________   PROCEDURES  Procedure(s) performed:  no  Procedures  Critical Care performed: yes  CRITICAL CARE Performed by: Darel Hong   Total critical care time: 35 minutes  Critical care time was exclusive of separately billable procedures and treating other patients.  Critical care was necessary to treat or prevent imminent or life-threatening deterioration.  Critical care was time spent personally by me on the following activities:  development of treatment plan with patient and/or surrogate as well as nursing, discussions with consultants, evaluation of patient's response to treatment, examination of patient, obtaining history from patient or surrogate, ordering and performing treatments and interventions, ordering and review of laboratory studies, ordering and review of radiographic studies, pulse oximetry and re-evaluation of patient's condition.   Observation: no ____________________________________________   INITIAL IMPRESSION / ASSESSMENT AND PLAN / ED COURSE  Pertinent labs & imaging results that were available during my care of the patient were reviewed by me and considered in my medical decision making (see chart for details).  Patient arrives with elevated respiratory rate with 100.7 degrees core temperature and obvious rigors.  Differential is broad, but I'm concerned for sepsis.  Vanc/zosyn now along with fluids and broad labs.     ----------------------------------------- 5:08 AM on 02/21/2017 -----------------------------------------  The patient's blood work is just back now with a number of abnormalities which are concerning.  He has severe hypokalemia that requires replacement with high doses of potassium as well as magnesium, he has an elevated troponin likely secondary to demand, however most interestingly he has very low albumin and elevated INR.  This combined with the new spider angiomata is concerning for new onset liver failure.  At this point given the concern for severe sepsis the patient requires  inpatient admission for IV antibiotics and IV fluid resuscitation. ____________________________________________   FINAL CLINICAL IMPRESSION(S) / ED DIAGNOSES  Final diagnoses:  Sepsis, due to unspecified organism ([place])  Elevated troponin  Hypokalemia      NEW MEDICATIONS STARTED DURING THIS VISIT:  New Prescriptions   No medications on file     Note:  This document was prepared using Dragon voice recognition software and may include unintentional dictation errors.     Darel Hong, MD 02/21/17 [phone]

## 2017-02-21 NOTE — ED Notes (Signed)
Tele neurology on the monitor with patient for assessment

## 2017-02-22 DIAGNOSIS — E785 Hyperlipidemia, unspecified: Secondary | ICD-10-CM | POA: Diagnosis not present

## 2017-02-22 DIAGNOSIS — I5022 Chronic systolic (congestive) heart failure: Secondary | ICD-10-CM | POA: Diagnosis not present

## 2017-02-22 DIAGNOSIS — I130 Hypertensive heart and chronic kidney disease with heart failure and stage 1 through stage 4 chronic kidney disease, or unspecified chronic kidney disease: Secondary | ICD-10-CM | POA: Diagnosis not present

## 2017-02-22 DIAGNOSIS — M1990 Unspecified osteoarthritis, unspecified site: Secondary | ICD-10-CM | POA: Diagnosis not present

## 2017-02-22 DIAGNOSIS — A419 Sepsis, unspecified organism: Secondary | ICD-10-CM | POA: Diagnosis not present

## 2017-02-22 DIAGNOSIS — I10 Essential (primary) hypertension: Secondary | ICD-10-CM | POA: Diagnosis not present

## 2017-02-22 DIAGNOSIS — R531 Weakness: Secondary | ICD-10-CM | POA: Diagnosis not present

## 2017-02-22 DIAGNOSIS — E876 Hypokalemia: Secondary | ICD-10-CM | POA: Diagnosis not present

## 2017-02-22 DIAGNOSIS — M11261 Other chondrocalcinosis, right knee: Secondary | ICD-10-CM | POA: Diagnosis not present

## 2017-02-22 DIAGNOSIS — K219 Gastro-esophageal reflux disease without esophagitis: Secondary | ICD-10-CM | POA: Diagnosis not present

## 2017-02-22 DIAGNOSIS — I2510 Atherosclerotic heart disease of native coronary artery without angina pectoris: Secondary | ICD-10-CM | POA: Diagnosis not present

## 2017-02-22 LAB — BASIC METABOLIC PANEL
Anion gap: 6 (ref 5–15)
BUN: 16 mg/dL (ref 6–20)
CO2: 26 mmol/L (ref 22–32)
Calcium: 8.4 mg/dL — ABNORMAL LOW (ref 8.9–10.3)
Chloride: 108 mmol/L (ref 101–111)
Creatinine, Ser: 1.06 mg/dL (ref 0.61–1.24)
GFR calc Af Amer: >60 mL/min (ref >60)
GFR calc non Af Amer: >60 mL/min (ref >60)
Glucose, Bld: 165 mg/dL — ABNORMAL HIGH (ref 65–99)
Potassium: 4 mmol/L (ref 3.5–5.1)
Sodium: 140 mmol/L (ref 135–145)

## 2017-02-22 LAB — PROTEIN, BODY FLUID (OTHER): Total Protein, Body Fluid Other: 2.6 g/dL

## 2017-02-22 LAB — URINE CULTURE: Culture: NO GROWTH

## 2017-02-22 LAB — GLUCOSE, BODY FLUID OTHER: Glucose, Body Fluid Other: 46 mg/dL

## 2017-02-22 LAB — URIC ACID, BODY FLUID: Uric Acid Body Fluid: 3.9 mg/dL

## 2017-02-22 MED ORDER — TRAMADOL HCL 50 MG PO TABS: 50.0000 mg | ORAL_TABLET | Freq: Three times a day (TID) | ORAL | 0 refills | Status: DC | PRN

## 2017-02-22 MED ORDER — PREDNISONE 50 MG PO TABS: 50.0000 mg | ORAL_TABLET | Freq: Every day | ORAL | 0 refills | Status: AC

## 2017-02-22 MED ORDER — POLYETHYLENE GLYCOL 3350 17 G PO PACK
17.0000 g | PACK | Freq: Every day | ORAL | Status: DC
  Administered 2017-02-22: 17 g via ORAL
  Filled 2017-02-22: qty 1

## 2017-02-22 NOTE — Progress Notes (Signed)
Pt discharged via wheelchair by nursing to the visitor's entrance 

## 2017-02-22 NOTE — Progress Notes (Signed)
MD order received in [facility] to discharge pt home with home health PT today; per telephone call to [facility] in Care Management, to establish services with [facility] PT per insurance verification; verbally reviewed AVS with pt including paper Rxs for Ultram and Prednisone, no questions voiced at this time

## 2017-02-22 NOTE — Care Management Note (Addendum)
Case Management Note  Patient Details  Name: Kyle [name] MRN: [id number] Date of Birth: 1931/12/02  Subjective/Objective:  Discussed discharge planning with [name].   A referral for home health PT was accepted by Malachy Mood at [facility].               Action/Plan:   Expected Discharge Date:  02/22/17               Expected Discharge Plan:  [place]  In-House Referral:  NA  Discharge planning Services  CM Consult  Post Acute Care Choice:  Home Health Choice offered to:  Patient  DME Arranged:  N/A DME Agency:  NA  HH Arranged:  PT HH Agency:  [place]  Status of Service:  Completed, signed off  If discussed at [place] of Stay Meetings, dates discussed:    Additional Comments:  Sheretha Shadd A, RN 02/22/2017, 12:24 PM

## 2017-02-22 NOTE — Evaluation (Signed)
Physical Therapy Evaluation Patient Details Name: Kyle [name] MRN: [id number] DOB: 10-31-1931 Today's Date: 02/22/2017   History of Present Illness  Pt admitted for sepsis. Pt complains of increased weakness and difficulty walking due to increased R knee pain. HIstory includes HTN and prostate cancer.  Clinical Impression  Pt is a pleasant 83 year old male who was admitted for sepsis and R knee pain, psuedogout. Pt performs bed mobility with supervision and transfers/ambulation with cga and RW. Pt demonstrates deficits with strength/mobility/balance. Educated to use RW at all times secondary to unsteadiness and general weakness from hospital admission. Pt is however getting close to baseline level. Will be safe to transition home. Would benefit from skilled PT to address above deficits and promote optimal return to PLOF. Recommend transition to [facility] upon discharge from acute hospitalization.       Follow Up Recommendations Home health PT    Equipment Recommendations  None recommended by PT (has RW)    Recommendations for Other Services       Precautions / Restrictions Precautions Precautions: Fall Restrictions Weight Bearing Restrictions: No      Mobility  Bed Mobility Overal bed mobility: Needs Assistance Bed Mobility: Supine to Sit     Supine to sit: Supervision     General bed mobility comments: safe technique performed with ability to sit at EOB. Reports mild pain with transition to sitting  Transfers Overall transfer level: Needs assistance Equipment used: Rolling walker (2 wheeled) Transfers: Sit to/from Stand Sit to Stand: Min guard         General transfer comment: slight unsteadiness noted with tendency for post leaning. Cues for safe technique for RW as he wants to pull on it to stand rather than push from seated surface  Ambulation/Gait Ambulation/Gait assistance: Min guard Ambulation Distance (Feet): 20 Feet Assistive device: Rolling walker (2  wheeled) Gait Pattern/deviations: Step-to pattern     General Gait Details: slow ambulation with distance limited by pt wanting to sit in recliner for breakfast. Pain did not appear to limit mobility. Safe technique, although slight unsteadiness with ambulation, needs RW for balance  Stairs            Wheelchair Mobility    Modified Rankin (Stroke Patients Only)       Balance Overall balance assessment: Needs assistance Sitting-balance support: Feet supported Sitting balance-Leahy Scale: Good     Standing balance support: Bilateral upper extremity supported Standing balance-Leahy Scale: Good                               Pertinent Vitals/Pain Pain Assessment: Faces Faces Pain Scale: Hurts a little bit Pain Location: R knee Pain Descriptors / Indicators: Discomfort;Dull Pain Intervention(s): Limited activity within patient's tolerance;Repositioned    Home Living Family/patient expects to be discharged to:: Private residence Living Arrangements: Spouse/significant other Available Help at Discharge: Family Type of Home: House Home Access: Stairs to enter Entrance Stairs-Rails: Can reach both Entrance Stairs-Number of Steps: 2 Home Layout: One level Home Equipment: Environmental consultant - 2 wheels;Cane - single point      Prior Function Level of Independence: Independent with assistive device(s)         Comments: was previously using SPC for mobility, recently been unable to ambulate as far     Hand Dominance        Extremity/Trunk Assessment   Upper Extremity Assessment Upper Extremity Assessment: Overall WFL for tasks assessed    Lower  Extremity Assessment Lower Extremity Assessment: Generalized weakness (R LE grossly 4/5; L LE grossly 5/5)       Communication   Communication: No difficulties  Cognition Arousal/Alertness: Awake/alert Behavior During Therapy: WFL for tasks assessed/performed Overall Cognitive Status: Within Functional Limits for  tasks assessed                                        General Comments      Exercises Other Exercises Other Exercises: B LE ther-ex performed including SLRs, quad sets, and hip abd/add. All ther-ex performed with cga 10 reps with little pain   Assessment/Plan    PT Assessment Patient needs continued PT services  PT Problem List Decreased strength;Decreased activity tolerance;Decreased balance;Decreased mobility;Pain       PT Treatment Interventions Gait training;DME instruction;Therapeutic exercise    PT Goals (Current goals can be found in the Care Plan section)  Acute Rehab PT Goals Patient Stated Goal: to go home PT Goal Formulation: With patient Time For Goal Achievement: 03/08/17 Potential to Achieve Goals: Good    Frequency Min 2X/week   Barriers to discharge        Co-evaluation               AM-PAC PT "6 Clicks" Daily Activity  Outcome Measure Difficulty turning over in bed (including adjusting bedclothes, sheets and blankets)?: None Difficulty moving from lying on back to sitting on the side of the bed? : None Difficulty sitting down on and standing up from a chair with arms (e.g., wheelchair, bedside commode, etc,.)?: Unable Help needed moving to and from a bed to chair (including a wheelchair)?: A Little Help needed walking in hospital room?: A Little Help needed climbing 3-5 steps with a railing? : A Little 6 Click Score: 18    End of Session Equipment Utilized During Treatment: Gait belt Activity Tolerance: Patient tolerated treatment well Patient left: in chair;with chair alarm set;with family/visitor present Nurse Communication: Mobility status PT Visit Diagnosis: Unsteadiness on feet (R26.81);Muscle weakness (generalized) (M62.81);Difficulty in walking, not elsewhere classified (R26.2);Pain Pain - Right/Left: Right Pain - part of body: Knee    Time: 4562-5638 PT Time Calculation (min) (ACUTE ONLY): 14 min   Charges:   PT  Evaluation $PT Eval Low Complexity: 1 Low PT Treatments $Therapeutic Exercise: 8-22 mins   PT G Codes:   PT G-Codes **NOT FOR INPATIENT CLASS** Functional Assessment Tool Used: AM-PAC 6 Clicks Basic Mobility Functional Limitation: Mobility: Walking and moving around Mobility: Walking and Moving Around Current Status (L3734): At least 40 percent but less than 60 percent impaired, limited or restricted Mobility: Walking and Moving Around Goal Status [phone]): At least 20 percent but less than 40 percent impaired, limited or restricted    Greggory Stallion, PT, DPT [phone]   Kristene Liberati 02/22/2017, 10:28 AM

## 2017-02-22 NOTE — Discharge Instructions (Signed)
[facility]

## 2017-02-24 DIAGNOSIS — Z8673 Personal history of transient ischemic attack (TIA), and cerebral infarction without residual deficits: Secondary | ICD-10-CM | POA: Diagnosis not present

## 2017-02-24 DIAGNOSIS — M109 Gout, unspecified: Secondary | ICD-10-CM | POA: Diagnosis not present

## 2017-02-24 DIAGNOSIS — Z87891 Personal history of nicotine dependence: Secondary | ICD-10-CM | POA: Diagnosis not present

## 2017-02-24 DIAGNOSIS — M150 Primary generalized (osteo)arthritis: Secondary | ICD-10-CM | POA: Diagnosis not present

## 2017-02-24 DIAGNOSIS — N183 Chronic kidney disease, stage 3 (moderate): Secondary | ICD-10-CM | POA: Diagnosis not present

## 2017-02-24 DIAGNOSIS — I2510 Atherosclerotic heart disease of native coronary artery without angina pectoris: Secondary | ICD-10-CM | POA: Diagnosis not present

## 2017-02-24 DIAGNOSIS — I255 Ischemic cardiomyopathy: Secondary | ICD-10-CM | POA: Diagnosis not present

## 2017-02-24 DIAGNOSIS — I5022 Chronic systolic (congestive) heart failure: Secondary | ICD-10-CM | POA: Diagnosis not present

## 2017-02-24 DIAGNOSIS — E7800 Pure hypercholesterolemia, unspecified: Secondary | ICD-10-CM | POA: Diagnosis not present

## 2017-02-24 DIAGNOSIS — I130 Hypertensive heart and chronic kidney disease with heart failure and stage 1 through stage 4 chronic kidney disease, or unspecified chronic kidney disease: Secondary | ICD-10-CM | POA: Diagnosis not present

## 2017-02-24 DIAGNOSIS — Z9181 History of falling: Secondary | ICD-10-CM

## 2017-02-24 DIAGNOSIS — Z7901 Long term (current) use of anticoagulants: Secondary | ICD-10-CM

## 2017-02-24 DIAGNOSIS — K210 Gastro-esophageal reflux disease with esophagitis: Secondary | ICD-10-CM | POA: Diagnosis not present

## 2017-02-24 DIAGNOSIS — Z8546 Personal history of malignant neoplasm of prostate: Secondary | ICD-10-CM | POA: Diagnosis not present

## 2017-02-24 DIAGNOSIS — I480 Paroxysmal atrial fibrillation: Secondary | ICD-10-CM | POA: Diagnosis not present

## 2017-02-24 LAB — BODY FLUID CULTURE: Culture: NO GROWTH

## 2017-02-26 LAB — CULTURE, BLOOD (ROUTINE X 2)
Culture: NO GROWTH
Culture: NO GROWTH
Special Requests: ADEQUATE

## 2017-02-26 NOTE — Discharge Summary (Signed)
[place] [facility] [place] NAME: Kyle [name]    MR#:  [id number]  DATE OF BIRTH:  10/26/1931  DATE OF ADMISSION:  02/21/2017 ADMITTING PHYSICIAN: Harrie Foreman, MD  DATE OF DISCHARGE: 02/22/2017 12:32 PM  PRIMARY CARE PHYSICIAN: Venia Carbon, MD   ADMISSION DIAGNOSIS:  Hypokalemia [E87.6] Elevated troponin [R74.8] Sepsis, due to unspecified organism ([place]) [A41.9]  DISCHARGE DIAGNOSIS:  Active Problems:   Sepsis ([place])   SECONDARY DIAGNOSIS:   Past Medical History:  Diagnosis Date  . Arthritis   . Cancer [facility])    prostate CA  . Claustrophobia    "EXTREMELY" (03/02/2015)  . Detached retina    right  . GERD (gastroesophageal reflux disease)   . H/O dizziness   . History of prostate cancer   . Hyperlipidemia   . Hypertension   . Impaired fasting glucose   . Stroke [facility]) ~ 1995-02/2009 X 2   /notes 08/22/2010  . Urgency of urination      ADMITTING HISTORY  Kyle [name] is an 83 y.o. male.   Chief Complaint: Weakness HPI: The patient with past medical history of hypertension, arthritis and prostate cancer presents emergency department complaining of weakness.  The patient states that his legs hurt and make it difficult for him to walk.  He specifically states that his knees are painful.  The symptoms have worsened while the patient has also had a decreased appetite for the last 3 weeks as well as subjective fevers including night sweats and chills.  In the emergency department he met criteria for sepsis which prompted initiation of sepsis protocol.  Initially, a source was not found however upon physical exam it seems the patient may have an infected knee.  The patient is received antibiotics as well as fluid resuscitation but I have asked the emergency department to perform an arthrocentesis.  The patient is stable and I have admitted him to the medical floor for further management.    HOSPITAL COURSE:   *Osteoarthritis with  pseudogout of right knee.  Status post arthrocentesis.  Cultures negative.  By the day of discharge patient was ambulated with physical therapy.  Pain has almost resolved.  Treated with IV steroids in the hospital.  Change to oral prednisone after discharge. *Hypokalemia.  Replaced orally.  Improved.  Other comorbidities remained stable.  No change in home medications.  Patient stable for discharge home with home health that has been set up with physical therapy.  CONSULTS OBTAINED:    DRUG ALLERGIES:   Allergies  Allergen Reactions  . Simvastatin     REACTION: myalgias    DISCHARGE MEDICATIONS:   Discharge Medication List as of 02/22/2017 12:21 PM    START taking these medications   Details  predniSONE (DELTASONE) 50 MG tablet Take 1 tablet (50 mg total) by mouth daily., Starting Sat 02/22/2017, Until Wed 02/26/2017, Print      CONTINUE these medications which have CHANGED   Details  traMADol (ULTRAM) 50 MG tablet Take 1 tablet (50 mg total) by mouth 3 (three) times daily as needed., Starting Sat 02/22/2017, Print      CONTINUE these medications which have NOT CHANGED   Details  albuterol (PROVENTIL HFA;VENTOLIN HFA) 108 (90 Base) MCG/ACT inhaler Inhale 2 puffs into the lungs every 6 (six) hours as needed for wheezing or shortness of breath., Starting Sat 02/01/2017, Normal    apixaban (ELIQUIS) 2.5 MG TABS tablet Take 1 tablet (2.5 mg total) by mouth 2 (  two) times daily., Starting Thu 05/02/2016, Normal    atorvastatin (LIPITOR) 80 MG tablet TAKE ONE TABLET EVERY DAY AT 6PM, Normal    furosemide (LASIX) 20 MG tablet Take 1 tablet (20 mg total) by mouth daily., Starting Thu 10/31/2016, Normal    losartan (COZAAR) 50 MG tablet Take 1 tablet (50 mg total) by mouth daily., Starting Thu 04/18/2016, Normal    pantoprazole (PROTONIX) 40 MG tablet Take 1 tablet (40 mg total) by mouth 2 (two) times daily before a meal., Starting Sat 02/01/2017, Until Sun 02/01/2018, Normal     tiZANidine (ZANAFLEX) 4 MG tablet Take 4 mg by mouth 3 (three) times daily as needed., Starting Mon 02/17/2017, Historical Med      STOP taking these medications     metoprolol tartrate (LOPRESSOR) 25 MG tablet      sucralfate (CARAFATE) 1 g tablet         Today   VITAL SIGNS:  Blood pressure 118/63, pulse 64, temperature 98.4 F (36.9 C), temperature source Oral, resp. rate 20, height 5' 10"  (1.778 m), weight 87.2 kg (192 lb 3 oz), SpO2 96 %.  I/O:  No intake or output data in the 24 hours ending 02/26/17 1646  PHYSICAL EXAMINATION:  Physical Exam  GENERAL:  83 y.o.-year-old patient lying in the bed with no acute distress.  LUNGS: Normal breath sounds bilaterally, no wheezing, rales,rhonchi or crepitation. No use of accessory muscles of respiration.  CARDIOVASCULAR: S1, S2 normal. No murmurs, rubs, or gallops.  ABDOMEN: Soft, non-tender, non-distended. Bowel sounds present. No organomegaly or mass.  NEUROLOGIC: Moves all 4 extremities. PSYCHIATRIC: The patient is alert and oriented x 3.  SKIN: No obvious rash, lesion, or ulcer.   DATA REVIEW:   CBC Recent Labs  Lab 02/21/17 0230  WBC 10.3  HGB 10.9*  HCT 33.1*  PLT 300    Chemistries  Recent Labs  Lab 02/21/17 0420  02/22/17 0544  NA 135  --  140  K 2.4*   < > 4.0  CL 102  --  108  CO2 26  --  26  GLUCOSE 130*  --  165*  BUN 14  --  16  CREATININE 1.13  --  1.06  CALCIUM 7.8*  --  8.4*  MG 1.6*  --   --   AST 16  --   --   ALT 6*  --   --   ALKPHOS 50  --   --   BILITOT 1.1  --   --    < > = values in this interval not displayed.    Cardiac Enzymes Recent Labs  Lab 02/21/17 0420  TROPONINI 0.08*    Microbiology Results  Results for orders placed or performed during the hospital encounter of 02/21/17  Culture, blood (Routine x 2)     Status: None   Collection Time: 02/21/17  2:30 AM  Result Value Ref Range Status   Specimen Description BLOOD LEFT ANTECUBITAL  Final   Special Requests    Final    BOTTLES DRAWN AEROBIC AND ANAEROBIC Blood Culture results may not be optimal due to an excessive volume of blood received in culture bottles   Culture NO GROWTH 5 DAYS  Final   Report Status 02/26/2017 FINAL  Final  Culture, blood (Routine x 2)     Status: None   Collection Time: 02/21/17  2:31 AM  Result Value Ref Range Status   Specimen Description BLOOD BLOOD LEFT FOREARM  Final  Special Requests   Final    BOTTLES DRAWN AEROBIC AND ANAEROBIC Blood Culture adequate volume   Culture NO GROWTH 5 DAYS  Final   Report Status 02/26/2017 FINAL  Final  Urine culture     Status: None   Collection Time: 02/21/17  3:12 AM  Result Value Ref Range Status   Specimen Description URINE, RANDOM  Final   Special Requests NONE  Final   Culture   Final    NO GROWTH Performed at [facility], [place] [address], [place], [place] [address]    Report Status 02/22/2017 FINAL  Final  Body fluid culture     Status: None   Collection Time: 02/21/17  9:12 AM  Result Value Ref Range Status   Specimen Description KNEE RIGHT KNEE  Final   Special Requests NONE  Final   Gram Stain   Final    ABUNDANT WBC PRESENT, PREDOMINANTLY PMN NO ORGANISMS SEEN    Culture   Final    NO GROWTH 3 DAYS Performed at [facility], [place] [address], [place], [place] [address]    Report Status 02/24/2017 FINAL  Final    RADIOLOGY:  No results found.  Follow up with PCP in 1 week.  Management plans discussed with the patient, family and they are in agreement.  CODE STATUS:  Code Status History    Date Active Date Inactive Code Status Order ID Comments User Context   02/21/2017 09:54 02/22/2017 17:49 Full Code [id number]  Harrie Foreman, MD Inpatient   01/30/2017 11:43 02/01/2017 14:35 Full Code [id number]  Hillary Bow, MD ED   03/02/2015 11:26 03/03/2015 18:11 Full Code [id number]  Jettie Booze, MD Inpatient   02/27/2015 09:00 03/02/2015 11:26 Full Code [id number]  Karen Kitchens  Inpatient   05/10/2014 22:34 05/15/2014 15:45 Full Code [id number]  Allyne Gee, MD ED    Advance Directive Documentation     Most Recent Value  Type of Advance Directive  Healthcare Power of Attorney, Living will  Pre-existing out of facility DNR order (yellow form or pink MOST form)  No data  "MOST" Form in Place?  No data      TOTAL TIME TAKING CARE OF THIS PATIENT ON DAY OF DISCHARGE: more than 30 minutes.   Hillary Bow R M.D on 02/26/2017 at 4:46 PM  Between 7am to 6pm - Pager - [phone]  After 6pm go to [email or web address] - password [email or web address]  Office  [phone]  CC: Primary care physician; Venia Carbon, MD  Note: This dictation was prepared with Dragon dictation along with smaller phrase technology. Any transcriptional errors that result from this process are unintentional.

## 2017-02-27 ENCOUNTER — Other Ambulatory Visit: Payer: Self-pay | Admitting: Cardiology

## 2017-02-27 ENCOUNTER — Telehealth: Payer: Self-pay

## 2017-02-27 DIAGNOSIS — I130 Hypertensive heart and chronic kidney disease with heart failure and stage 1 through stage 4 chronic kidney disease, or unspecified chronic kidney disease: Secondary | ICD-10-CM | POA: Diagnosis not present

## 2017-02-27 DIAGNOSIS — M109 Gout, unspecified: Secondary | ICD-10-CM | POA: Diagnosis not present

## 2017-02-27 DIAGNOSIS — N183 Chronic kidney disease, stage 3 (moderate): Secondary | ICD-10-CM | POA: Diagnosis not present

## 2017-02-27 DIAGNOSIS — I480 Paroxysmal atrial fibrillation: Secondary | ICD-10-CM | POA: Diagnosis not present

## 2017-02-27 DIAGNOSIS — I5022 Chronic systolic (congestive) heart failure: Secondary | ICD-10-CM | POA: Diagnosis not present

## 2017-02-27 DIAGNOSIS — M150 Primary generalized (osteo)arthritis: Secondary | ICD-10-CM | POA: Diagnosis not present

## 2017-02-27 NOTE — Telephone Encounter (Signed)
Spoke to pt. He said he is doing better. Has an appt with [place] tomorrow

## 2017-02-28 ENCOUNTER — Encounter: Payer: Self-pay | Admitting: Internal Medicine

## 2017-02-28 ENCOUNTER — Ambulatory Visit (INDEPENDENT_AMBULATORY_CARE_PROVIDER_SITE_OTHER): Payer: Medicare Other | Admitting: Internal Medicine

## 2017-02-28 VITALS — BP 110/64 | HR 45 | Temp 97.6°F | Wt 191.0 lb

## 2017-02-28 DIAGNOSIS — I255 Ischemic cardiomyopathy: Secondary | ICD-10-CM

## 2017-02-28 DIAGNOSIS — M11261 Other chondrocalcinosis, right knee: Secondary | ICD-10-CM

## 2017-02-28 MED ORDER — COLCHICINE 0.6 MG PO TABS: 0.6000 mg | ORAL_TABLET | Freq: Two times a day (BID) | ORAL | 1 refills | Status: DC | PRN

## 2017-02-28 NOTE — Assessment & Plan Note (Signed)
Resolved with steroid Rx Working with PT to regain full function Discussed colchicine at first sign of joint swelling again-- and visit to consider cortisone shot

## 2017-02-28 NOTE — Patient Instructions (Signed)
If you get joint swelling again, start the colchicine pills---up to 2 a day (can be as close as 1-2 hours apart). Then set up appointment, especially if there is not very quick relief.

## 2017-02-28 NOTE — Progress Notes (Signed)
Subjective:    Patient ID: Kyle [name], male    DOB: 03-31-1932, 83 y.o.   MRN: [id number]  HPI Here for hospital follow up  Was admitted with concern for septic right knee It was very swollen and couldn't get out of chair No fever No chills but some sweats Call 911--couldn't get out of bed  He was admitted Rx with antibiotic there---sent home on prednisone taper Arthrocentesis--culture negative No topical Rx Working with PT at home for strengthening  Current Outpatient Medications on File Prior to Visit  Medication Sig Dispense Refill  . albuterol (PROVENTIL HFA;VENTOLIN HFA) 108 (90 Base) MCG/ACT inhaler Inhale 2 puffs into the lungs every 6 (six) hours as needed for wheezing or shortness of breath. 1 Inhaler 0  . apixaban (ELIQUIS) 2.5 MG TABS tablet Take 1 tablet (2.5 mg total) by mouth 2 (two) times daily. 60 tablet 10  . atorvastatin (LIPITOR) 80 MG tablet TAKE ONE TABLET EVERY DAY AT 6PM 30 tablet 1  . furosemide (LASIX) 20 MG tablet Take 1 tablet (20 mg total) by mouth daily. 30 tablet 10  . losartan (COZAAR) 50 MG tablet Take 1 tablet (50 mg total) by mouth daily. 90 tablet 3  . pantoprazole (PROTONIX) 40 MG tablet TAKE ONE TABLET TWICE DAILY BEFORE MEALS 60 tablet 0  . tiZANidine (ZANAFLEX) 4 MG tablet Take 4 mg by mouth 3 (three) times daily as needed.    . traMADol (ULTRAM) 50 MG tablet Take 1 tablet (50 mg total) by mouth 3 (three) times daily as needed. 15 tablet 0   No current facility-administered medications on file prior to visit.     Allergies  Allergen Reactions  . Simvastatin     REACTION: myalgias    Past Medical History:  Diagnosis Date  . Arthritis   . Cancer [facility])    prostate CA  . Claustrophobia    "EXTREMELY" (03/02/2015)  . Detached retina    right  . GERD (gastroesophageal reflux disease)   . H/O dizziness   . History of prostate cancer   . Hyperlipidemia   . Hypertension   . Impaired fasting glucose   . Stroke [facility]) ~  1995-02/2009 X 2   /notes 08/22/2010  . Urgency of urination     Past Surgical History:  Procedure Laterality Date  . CARDIAC CATHETERIZATION  ~ 1995   Archie Endo 08/22/2010  . COLONOSCOPY W/ POLYPECTOMY    . EYE SURGERY Right   . HERNIA REPAIR     hx UHR/notes 08/22/2010  . INGUINAL HERNIA REPAIR Left    Archie Endo 08/22/2010  . [place]   Archie Endo 08/22/2010  . RETINAL DETACHMENT REPAIR W/ SCLERAL BUCKLE LE Right 08/2006   Archie Endo 08/22/2010  . TONSILLECTOMY    . UMBILICAL HERNIA REPAIR     hx/notes 08/22/2010    Family History  Problem Relation Age of Onset  . Heart disease Mother   . Coronary artery disease Mother   . Heart disease Father   . Coronary artery disease Father   . Heart disease Brother   . Heart disease Brother   . Cancer Brother        prostate  . Diabetes Neg Hx     Social History   Socioeconomic History  . Marital status: Married    Spouse name: Not on file  . Number of children: 1  . Years of education: Not on file  . Highest education level: Not on file  Social Needs  .  Financial resource strain: Not on file  . Food insecurity - worry: Not on file  . Food insecurity - inability: Not on file  . Transportation needs - medical: Not on file  . Transportation needs - non-medical: Not on file  Occupational History  . Occupation: retired    Fish farm manager: RETIRED  Tobacco Use  . Smoking status: Never Smoker  . Smokeless tobacco: Former Systems developer    Types: Chew  Substance and Sexual Activity  . Alcohol use: No  . Drug use: No  . Sexual activity: Not on file  Other Topics Concern  . Not on file  Social History Narrative   Has living will   Wife is health care POA   Would accept CPR but no prolonged machines.    Would not want a feeding tube if cognitively unaware         Review of Systems  Appetite is good Sleeping fine now     Objective:   Physical Exam  Constitutional: No distress.  Musculoskeletal:  No active synovitis in right knee--- no  effusion noted No redness, etc          Assessment & Plan:

## 2017-03-03 DIAGNOSIS — N183 Chronic kidney disease, stage 3 (moderate): Secondary | ICD-10-CM | POA: Diagnosis not present

## 2017-03-03 DIAGNOSIS — I480 Paroxysmal atrial fibrillation: Secondary | ICD-10-CM | POA: Diagnosis not present

## 2017-03-03 DIAGNOSIS — M150 Primary generalized (osteo)arthritis: Secondary | ICD-10-CM | POA: Diagnosis not present

## 2017-03-03 DIAGNOSIS — M109 Gout, unspecified: Secondary | ICD-10-CM | POA: Diagnosis not present

## 2017-03-03 DIAGNOSIS — I5022 Chronic systolic (congestive) heart failure: Secondary | ICD-10-CM | POA: Diagnosis not present

## 2017-03-03 DIAGNOSIS — I130 Hypertensive heart and chronic kidney disease with heart failure and stage 1 through stage 4 chronic kidney disease, or unspecified chronic kidney disease: Secondary | ICD-10-CM | POA: Diagnosis not present

## 2017-03-05 DIAGNOSIS — I130 Hypertensive heart and chronic kidney disease with heart failure and stage 1 through stage 4 chronic kidney disease, or unspecified chronic kidney disease: Secondary | ICD-10-CM | POA: Diagnosis not present

## 2017-03-05 DIAGNOSIS — N183 Chronic kidney disease, stage 3 (moderate): Secondary | ICD-10-CM | POA: Diagnosis not present

## 2017-03-05 DIAGNOSIS — I480 Paroxysmal atrial fibrillation: Secondary | ICD-10-CM | POA: Diagnosis not present

## 2017-03-05 DIAGNOSIS — M150 Primary generalized (osteo)arthritis: Secondary | ICD-10-CM | POA: Diagnosis not present

## 2017-03-05 DIAGNOSIS — M109 Gout, unspecified: Secondary | ICD-10-CM | POA: Diagnosis not present

## 2017-03-05 DIAGNOSIS — I5022 Chronic systolic (congestive) heart failure: Secondary | ICD-10-CM | POA: Diagnosis not present

## 2017-03-10 DIAGNOSIS — I5022 Chronic systolic (congestive) heart failure: Secondary | ICD-10-CM | POA: Diagnosis not present

## 2017-03-10 DIAGNOSIS — I480 Paroxysmal atrial fibrillation: Secondary | ICD-10-CM | POA: Diagnosis not present

## 2017-03-10 DIAGNOSIS — M150 Primary generalized (osteo)arthritis: Secondary | ICD-10-CM | POA: Diagnosis not present

## 2017-03-10 DIAGNOSIS — M109 Gout, unspecified: Secondary | ICD-10-CM | POA: Diagnosis not present

## 2017-03-10 DIAGNOSIS — I130 Hypertensive heart and chronic kidney disease with heart failure and stage 1 through stage 4 chronic kidney disease, or unspecified chronic kidney disease: Secondary | ICD-10-CM | POA: Diagnosis not present

## 2017-03-10 DIAGNOSIS — N183 Chronic kidney disease, stage 3 (moderate): Secondary | ICD-10-CM | POA: Diagnosis not present

## 2017-03-11 ENCOUNTER — Other Ambulatory Visit: Payer: Self-pay | Admitting: Cardiology

## 2017-03-12 DIAGNOSIS — I130 Hypertensive heart and chronic kidney disease with heart failure and stage 1 through stage 4 chronic kidney disease, or unspecified chronic kidney disease: Secondary | ICD-10-CM | POA: Diagnosis not present

## 2017-03-12 DIAGNOSIS — N183 Chronic kidney disease, stage 3 (moderate): Secondary | ICD-10-CM | POA: Diagnosis not present

## 2017-03-12 DIAGNOSIS — I5022 Chronic systolic (congestive) heart failure: Secondary | ICD-10-CM | POA: Diagnosis not present

## 2017-03-12 DIAGNOSIS — M109 Gout, unspecified: Secondary | ICD-10-CM | POA: Diagnosis not present

## 2017-03-12 DIAGNOSIS — M150 Primary generalized (osteo)arthritis: Secondary | ICD-10-CM | POA: Diagnosis not present

## 2017-03-12 DIAGNOSIS — I480 Paroxysmal atrial fibrillation: Secondary | ICD-10-CM | POA: Diagnosis not present

## 2017-03-17 DIAGNOSIS — M109 Gout, unspecified: Secondary | ICD-10-CM | POA: Diagnosis not present

## 2017-03-17 DIAGNOSIS — I130 Hypertensive heart and chronic kidney disease with heart failure and stage 1 through stage 4 chronic kidney disease, or unspecified chronic kidney disease: Secondary | ICD-10-CM | POA: Diagnosis not present

## 2017-03-17 DIAGNOSIS — I5022 Chronic systolic (congestive) heart failure: Secondary | ICD-10-CM | POA: Diagnosis not present

## 2017-03-17 DIAGNOSIS — N183 Chronic kidney disease, stage 3 (moderate): Secondary | ICD-10-CM | POA: Diagnosis not present

## 2017-03-17 DIAGNOSIS — M150 Primary generalized (osteo)arthritis: Secondary | ICD-10-CM | POA: Diagnosis not present

## 2017-03-17 DIAGNOSIS — I480 Paroxysmal atrial fibrillation: Secondary | ICD-10-CM | POA: Diagnosis not present

## 2017-03-20 DIAGNOSIS — N183 Chronic kidney disease, stage 3 (moderate): Secondary | ICD-10-CM | POA: Diagnosis not present

## 2017-03-20 DIAGNOSIS — M150 Primary generalized (osteo)arthritis: Secondary | ICD-10-CM | POA: Diagnosis not present

## 2017-03-20 DIAGNOSIS — I480 Paroxysmal atrial fibrillation: Secondary | ICD-10-CM | POA: Diagnosis not present

## 2017-03-20 DIAGNOSIS — I5022 Chronic systolic (congestive) heart failure: Secondary | ICD-10-CM | POA: Diagnosis not present

## 2017-03-20 DIAGNOSIS — M109 Gout, unspecified: Secondary | ICD-10-CM | POA: Diagnosis not present

## 2017-03-20 DIAGNOSIS — I130 Hypertensive heart and chronic kidney disease with heart failure and stage 1 through stage 4 chronic kidney disease, or unspecified chronic kidney disease: Secondary | ICD-10-CM | POA: Diagnosis not present

## 2017-03-23 NOTE — Progress Notes (Addendum)
Cardiology Office Note   Date:  03/24/2017   ID:  Kyle [name], DOB 1932/04/03, MRN [id number]  PCP:  Venia Carbon, MD  Cardiologist:   Minus Breeding, MD    Chief Complaint  Patient presents with  . Atrial Fibrillation         History of Present Illness: Kyle [name] is a 83 y.o. male who presents for follow up of CAD.   The patient was at [facility]-03/03/15 for NSTEMI, new systolic CHF and new onset atrial fibrillation. 2D echo showed an EF of 15%. Subsequent LHC showed CAD with 75% stenosed mid LAD s/p PCI with stent plus diffuse disease in the left PDA with an 80% lesion in the proximal nondominant RCA.  His EF did improve to 35 - 40%.  Since I last saw him he has been in the hospital twice with GI bleed and esophagitis.  He was later in the hospital with knee infection.     He returns for follow-up.  He has not been having any acute complaints since his last hospitalization.  He still has some knee pain and gets around with a cane but he is not had any acute cardiovascular complaints. The patient denies any new symptoms such as chest discomfort, neck or arm discomfort. There has been no new shortness of breath, PND or orthopnea. There have been no reported palpitations, presyncope or syncope.   Past Medical History:  Diagnosis Date  . Arthritis   . Cancer [facility])    prostate CA  . Claustrophobia    "EXTREMELY" (03/02/2015)  . Detached retina    right  . GERD (gastroesophageal reflux disease)   . H/O dizziness   . History of prostate cancer   . Hyperlipidemia   . Hypertension   . Impaired fasting glucose   . Stroke [facility]) ~ 1995-02/2009 X 2   /notes 08/22/2010  . Urgency of urination     Past Surgical History:  Procedure Laterality Date  . CARDIAC CATHETERIZATION  ~ 1995   Kyle [name] 08/22/2010  . CARDIAC CATHETERIZATION N/A 03/02/2015   Procedure: Left Heart Cath and Coronary Angiography;  Surgeon: Kyle Booze, MD;  Location: [place] CV LAB;  Service:  Cardiovascular;  Laterality: N/A;  . CARDIAC CATHETERIZATION  03/02/2015   Procedure: Coronary Stent Intervention;  Surgeon: Kyle Booze, MD;  Location: [place] CV [facility];  Service: Cardiovascular;;  . COLONOSCOPY W/ POLYPECTOMY    . ESOPHAGOGASTRODUODENOSCOPY (EGD) WITH PROPOFOL N/A 01/31/2017   Procedure: ESOPHAGOGASTRODUODENOSCOPY (EGD) WITH PROPOFOL;  Surgeon: Kyle Lame, MD;  Location: [facility] ENDOSCOPY;  Service: Endoscopy;  Laterality: N/A;  . EYE SURGERY Right   . HERNIA REPAIR     hx UHR/notes 08/22/2010  . INGUINAL HERNIA REPAIR Left    Kyle [name] 08/22/2010  . LUMBAR LAMINECTOMY/DECOMPRESSION MICRODISCECTOMY  01/24/2012   Procedure: LUMBAR LAMINECTOMY/DECOMPRESSION MICRODISCECTOMY 1 LEVEL;  Surgeon: Winfield Cunas, MD;  Location: [facility] NEURO ORS;  Service: Neurosurgery;  Laterality: Right;  RIGHT Lumbar Three-Four far lateral diskectomy  . [place]   Kyle [name] 08/22/2010  . RETINAL DETACHMENT REPAIR W/ SCLERAL BUCKLE LE Right 08/2006   Kyle [name] 08/22/2010  . TONSILLECTOMY    . UMBILICAL HERNIA REPAIR     hx/notes 08/22/2010     Current Outpatient Medications  Medication Sig Dispense Refill  . albuterol (PROVENTIL HFA;VENTOLIN HFA) 108 (90 Base) MCG/ACT inhaler Inhale 2 puffs into the lungs every 6 (six) hours as needed for wheezing or shortness of breath. 1  Inhaler 0  . atorvastatin (LIPITOR) 80 MG tablet TAKE ONE TABLET BY MOUTH EVERY DAY AT 6PM 30 tablet 11  . colchicine 0.6 MG tablet Take 1 tablet (0.6 mg total) 2 (two) times daily as needed by mouth. For joint swelling 60 tablet 1  . furosemide (LASIX) 20 MG tablet Take 1 tablet (20 mg total) by mouth daily. 30 tablet 10  . losartan (COZAAR) 50 MG tablet Take 1 tablet (50 mg total) by mouth daily. 90 tablet 3  . pantoprazole (PROTONIX) 40 MG tablet TAKE ONE TABLET TWICE DAILY BEFORE MEALS 60 tablet 0  . tiZANidine (ZANAFLEX) 4 MG tablet Take 4 mg by mouth 3 (three) times daily as needed.    . traMADol (ULTRAM) 50 MG  tablet Take 1 tablet (50 mg total) by mouth 3 (three) times daily as needed. 15 tablet 0  . apixaban (ELIQUIS) 5 MG TABS tablet Take 1 tablet (5 mg total) by mouth 2 (two) times daily. 60 tablet 11   No current facility-administered medications for this visit.     Allergies:   Simvastatin     ROS:  Please see the history of present illness.   Otherwise, review of systems are positive for none.   All other systems are reviewed and negative.    PHYSICAL EXAM: VS:  BP (!) 160/90   Pulse 85   Ht 6' (1.829 m)   Wt 198 lb 12.8 oz (90.2 kg)   BMI 26.96 kg/m  , BMI Body mass index is 26.96 kg/m.  GENERAL:  Well appearing NECK:  No jugular venous distention, waveform within normal limits, carotid upstroke brisk and symmetric, no bruits, no thyromegaly LUNGS:  Clear to auscultation bilaterally CHEST:  Unremarkable HEART:  PMI not displaced or sustained,S1 and S2 within normal limits, no S3, no S4, no clicks, no rubs, 2 out of 6 early to mid peaking systolic murmur radiating slightly at the aortic outflow tract, no diastolic murmurs ABD:  Flat, positive bowel sounds normal in frequency in pitch, no bruits, no rebound, no guarding, no midline pulsatile mass, no hepatomegaly, no splenomegaly EXT:  2 plus pulses throughout, no edema, no cyanosis no clubbing, swelling in the right knee slightly    EKG:  EKG is  not ordered today.    Recent Labs: 02/21/2017: ALT 6; B Natriuretic Peptide 177.0; Hemoglobin 10.9; Magnesium 1.6; Platelets 300; TSH 0.615 02/22/2017: BUN 16; Creatinine, Ser 1.06; Potassium 4.0; Sodium 140    Lipid Panel    Component Value Date/Time   CHOL 134 10/25/2016 1156   TRIG 61 10/25/2016 1156   TRIG 119 05/01/2006 1043   HDL 66 10/25/2016 1156   CHOLHDL 2.0 10/25/2016 1156   CHOLHDL 3.7 03/01/2015 0327   VLDL 13 03/01/2015 0327   LDLCALC 56 10/25/2016 1156   LDLDIRECT 164.4 10/10/2010 1012      Wt Readings from Last 3 Encounters:  03/24/17 198 lb 12.8 oz (90.2  kg)  02/28/17 191 lb (86.6 kg)  02/22/17 192 lb 3 oz (87.2 kg)      Other studies Reviewed: Additional studies/ records that were reviewed today include: Two different hospitalizations Review of the above records demonstrates:   See elsewhere   ASSESSMENT AND PLAN:  CAD:  The patient has no new sypmtoms.  No further cardiovascular testing is indicated.  We will continue with aggressive risk reduction and meds as listed.    ISCHEMIC CARDIOMYOPATHY:    I will check an echo to further evaluate this.  He seems  to be euvloemic.   No reviewing the hospital records his beta-blocker was discontinued probably because of hypotension.  Given his frailty I am removed slowly and will use the echocardiogram to guide whether I should try to restart a beta-blocker.  I want to avoid more medicines if at all feasible.  ATRIAL FIB:   Mr. MC BLOODWORTH has a CHA2DS2 - VASc score of 7.   He is not on the appropriate dose of Eliquis given his weight and creat.   HTN:    The blood pressure is elevated but he says that this is unusual.  As above, I will move slowly with meds.   DYSLIPIDEMIA:   These are OK as above.   AS:  This was mild and will be followed on the echo as above.   Current medicines are reviewed at length with the patient today.  The patient does not have concerns regarding medicines.  The following changes have been made:   As above.   Labs/ tests ordered today include:     Orders Placed This Encounter  Procedures  . ECHOCARDIOGRAM COMPLETE     Disposition:   FU with me in 4 months.     Signed, Minus Breeding, MD  03/24/2017 8:41 AM    [facility]

## 2017-03-24 ENCOUNTER — Ambulatory Visit (INDEPENDENT_AMBULATORY_CARE_PROVIDER_SITE_OTHER): Payer: Medicare Other | Admitting: Cardiology

## 2017-03-24 ENCOUNTER — Encounter: Payer: Self-pay | Admitting: Cardiology

## 2017-03-24 VITALS — BP 160/90 | HR 85 | Ht 72.0 in | Wt 198.8 lb

## 2017-03-24 DIAGNOSIS — I480 Paroxysmal atrial fibrillation: Secondary | ICD-10-CM

## 2017-03-24 DIAGNOSIS — Z79899 Other long term (current) drug therapy: Secondary | ICD-10-CM

## 2017-03-24 DIAGNOSIS — E785 Hyperlipidemia, unspecified: Secondary | ICD-10-CM | POA: Diagnosis not present

## 2017-03-24 DIAGNOSIS — I255 Ischemic cardiomyopathy: Secondary | ICD-10-CM

## 2017-03-24 DIAGNOSIS — I350 Nonrheumatic aortic (valve) stenosis: Secondary | ICD-10-CM | POA: Diagnosis not present

## 2017-03-24 DIAGNOSIS — I2589 Other forms of chronic ischemic heart disease: Secondary | ICD-10-CM

## 2017-03-24 MED ORDER — APIXABAN 5 MG PO TABS: 5.0000 mg | ORAL_TABLET | Freq: Two times a day (BID) | ORAL | 11 refills | Status: DC

## 2017-03-24 NOTE — Patient Instructions (Signed)
Medication Instructions:  INCREASE- Eliquis 5 mg twice a day  If you need a refill on your cardiac medications before your next appointment, please call your pharmacy.  Labwork: CBC Today HERE IN OUR OFFICE AT [facility]  Take the provided lab slips for you to take with you to the lab for you blood draw.   You will NOT need to fast   You may go to any [facility] lab that is convenient for you however, we do have a lab in our office that is able to assist you. You do NOT need an appointment for our lab. Once in our office lobby there is a podium to the right of the check-in desk where you are to sign-in and ring a doorbell to alert [place] you are here. Lab is open Monday-Friday from 8:00am to 4:00pm; and is closed for lunch from 12:45p-1:45pm   Testing/Procedures: Your physician has requested that you have an echocardiogram. Echocardiography is a painless test that uses sound waves to create images of your heart. It provides your doctor with information about the size and shape of your heart and how well your heart's chambers and valves are working. This procedure takes approximately one hour. There are no restrictions for this procedure.   Follow-Up: Your physician wants you to follow-up in: 4 Months.    Thank you for choosing [facility] at [facility]!!

## 2017-03-25 LAB — CBC
Hematocrit: 35.3 % — ABNORMAL LOW (ref 37.5–51.0)
Hemoglobin: 11 g/dL — ABNORMAL LOW (ref 13.0–17.7)
MCH: 28.1 pg (ref 26.6–33.0)
MCHC: 31.2 g/dL — ABNORMAL LOW (ref 31.5–35.7)
MCV: 90 fL (ref 79–97)
Platelets: 322 10*3/uL (ref 150–379)
RBC: 3.92 x10E6/uL — ABNORMAL LOW (ref 4.14–5.80)
RDW: 15.9 % — ABNORMAL HIGH (ref 12.3–15.4)
WBC: 8 10*3/uL (ref 3.4–10.8)

## 2017-03-26 ENCOUNTER — Other Ambulatory Visit: Payer: Self-pay | Admitting: Cardiology

## 2017-04-21 ENCOUNTER — Other Ambulatory Visit: Payer: Self-pay | Admitting: Cardiology

## 2017-04-21 DIAGNOSIS — I5021 Acute systolic (congestive) heart failure: Secondary | ICD-10-CM

## 2017-04-23 ENCOUNTER — Other Ambulatory Visit: Payer: Self-pay | Admitting: Internal Medicine

## 2017-04-24 DIAGNOSIS — H6980 Other specified disorders of Eustachian tube, unspecified ear: Secondary | ICD-10-CM | POA: Diagnosis not present

## 2017-04-24 DIAGNOSIS — H6123 Impacted cerumen, bilateral: Secondary | ICD-10-CM | POA: Diagnosis not present

## 2017-04-24 DIAGNOSIS — H903 Sensorineural hearing loss, bilateral: Secondary | ICD-10-CM | POA: Diagnosis not present

## 2017-04-24 NOTE — Telephone Encounter (Signed)
Last rx 02/22/17. Last OV 02/2017

## 2017-04-25 NOTE — Telephone Encounter (Signed)
Please call in.  Thanks.   

## 2017-04-25 NOTE — Telephone Encounter (Signed)
Medication phoned to pharmacy.  

## 2017-05-06 ENCOUNTER — Other Ambulatory Visit: Payer: Self-pay | Admitting: Cardiology

## 2017-05-08 ENCOUNTER — Other Ambulatory Visit (HOSPITAL_COMMUNITY): Payer: Medicare Other

## 2017-06-02 ENCOUNTER — Other Ambulatory Visit: Payer: Self-pay | Admitting: Cardiology

## 2017-06-02 ENCOUNTER — Other Ambulatory Visit: Payer: Self-pay | Admitting: Family Medicine

## 2017-06-02 NOTE — Telephone Encounter (Signed)
REFILL 

## 2017-06-02 NOTE — Telephone Encounter (Signed)
Sent.  Needs refills to come through PCP.  Routed to PCP as FYI.  Thanks.

## 2017-06-02 NOTE — Telephone Encounter (Signed)
Electronic refill request. Tramadol Last office visit:   02/28/17 Last Filled:    90 tablet 0 04/25/2017  Please advise.

## 2017-06-16 ENCOUNTER — Encounter: Payer: Self-pay | Admitting: Internal Medicine

## 2017-06-16 ENCOUNTER — Ambulatory Visit (INDEPENDENT_AMBULATORY_CARE_PROVIDER_SITE_OTHER): Payer: Medicare Other | Admitting: Internal Medicine

## 2017-06-16 VITALS — BP 130/88 | HR 76 | Temp 97.4°F | Ht 69.5 in | Wt 189.0 lb

## 2017-06-16 DIAGNOSIS — M11261 Other chondrocalcinosis, right knee: Secondary | ICD-10-CM | POA: Diagnosis not present

## 2017-06-16 DIAGNOSIS — N183 Chronic kidney disease, stage 3 unspecified: Secondary | ICD-10-CM

## 2017-06-16 DIAGNOSIS — Z0000 Encounter for general adult medical examination without abnormal findings: Secondary | ICD-10-CM | POA: Diagnosis not present

## 2017-06-16 DIAGNOSIS — L570 Actinic keratosis: Secondary | ICD-10-CM | POA: Diagnosis not present

## 2017-06-16 DIAGNOSIS — Z7189 Other specified counseling: Secondary | ICD-10-CM

## 2017-06-16 DIAGNOSIS — I5022 Chronic systolic (congestive) heart failure: Secondary | ICD-10-CM | POA: Diagnosis not present

## 2017-06-16 DIAGNOSIS — I480 Paroxysmal atrial fibrillation: Secondary | ICD-10-CM | POA: Diagnosis not present

## 2017-06-16 DIAGNOSIS — I25119 Atherosclerotic heart disease of native coronary artery with unspecified angina pectoris: Secondary | ICD-10-CM

## 2017-06-16 LAB — COMPREHENSIVE METABOLIC PANEL
ALT: 6 U/L (ref 0–53)
AST: 13 U/L (ref 0–37)
Albumin: 3.6 g/dL (ref 3.5–5.2)
Alkaline Phosphatase: 81 U/L (ref 39–117)
BUN: 10 mg/dL (ref 6–23)
CO2: 31 mEq/L (ref 19–32)
Calcium: 9.2 mg/dL (ref 8.4–10.5)
Chloride: 105 mEq/L (ref 96–112)
Creatinine, Ser: 1.21 mg/dL (ref 0.40–1.50)
GFR: 60.71 mL/min (ref >60.00)
Glucose, Bld: 107 mg/dL — ABNORMAL HIGH (ref 70–99)
Potassium: 3.6 mEq/L (ref 3.5–5.1)
Sodium: 142 mEq/L (ref 135–145)
Total Bilirubin: 0.6 mg/dL (ref 0.2–1.2)
Total Protein: 7.2 g/dL (ref 6.0–8.3)

## 2017-06-16 LAB — CBC
HCT: 35.9 % — ABNORMAL LOW (ref 39.0–52.0)
Hemoglobin: 11.5 g/dL — ABNORMAL LOW (ref 13.0–17.0)
MCHC: 32.1 g/dL (ref 30.0–36.0)
MCV: 83 fl (ref 78.0–100.0)
Platelets: 269 10*3/uL (ref 150.0–400.0)
RBC: 4.33 Mil/uL (ref 4.22–5.81)
RDW: 18.1 % — ABNORMAL HIGH (ref 11.5–15.5)
WBC: 7 10*3/uL (ref 4.0–10.5)

## 2017-06-16 LAB — T4, FREE: Free T4: 0.88 ng/dL (ref 0.60–1.60)

## 2017-06-16 MED ORDER — COLCHICINE 0.6 MG PO TABS: 0.6000 mg | ORAL_TABLET | Freq: Two times a day (BID) | ORAL | 1 refills | Status: DC | PRN

## 2017-06-16 NOTE — Assessment & Plan Note (Signed)
Stable DOE No action needed

## 2017-06-16 NOTE — Assessment & Plan Note (Signed)
Compensated. No changes needed. 

## 2017-06-16 NOTE — Assessment & Plan Note (Signed)
I have personally reviewed the Medicare Annual Wellness questionnaire and have noted 1. The patient's medical and social history 2. Their use of alcohol, tobacco or illicit drugs 3. Their current medications and supplements 4. The patient's functional ability including ADL's, fall risks, home safety risks and hearing or visual             impairment. 5. Diet and physical activities 6. Evidence for depression or mood disorders  The patients weight, height, BMI and visual acuity have been recorded in the chart I have made referrals, counseling and provided education to the patient based review of the above and I have provided the pt with a written personalized care plan for preventive services.  I have provided you with a copy of your personalized plan for preventive services. Please take the time to review along with your updated medication list.  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

## 2017-06-16 NOTE — Progress Notes (Signed)
Hearing Screening Comments: January 2019. No hearing aids needed Vision Screening Comments: January 2019

## 2017-06-16 NOTE — Assessment & Plan Note (Signed)
Rate is good On the eliquis

## 2017-06-16 NOTE — Assessment & Plan Note (Signed)
Will recheck labs 

## 2017-06-16 NOTE — Assessment & Plan Note (Signed)
Discussed options Verbal consent Liquid nitrogen to each lesion 30 seconds x 2 Tolerated well Discussed home care---to derm if any recurrence

## 2017-06-16 NOTE — Assessment & Plan Note (Signed)
See social history 

## 2017-06-16 NOTE — Assessment & Plan Note (Signed)
Colchicine highly effective for flares
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
## 2017-06-30 ENCOUNTER — Other Ambulatory Visit: Payer: Self-pay | Admitting: Family Medicine

## 2017-06-30 NOTE — Telephone Encounter (Signed)
Last filled 06-03-17 #90 Last OV 06-16-17 Next OV 06-23-18  Spoke to pt to find out why he was out of medication. He said he was almost out. Not completely out as the refill request was saying.

## 2017-07-14 ENCOUNTER — Other Ambulatory Visit: Payer: Self-pay | Admitting: Cardiology

## 2017-07-15 ENCOUNTER — Other Ambulatory Visit: Payer: Self-pay

## 2017-07-15 MED ORDER — ATORVASTATIN CALCIUM 80 MG PO TABS: ORAL_TABLET | ORAL | 11 refills | Status: DC

## 2017-08-11 ENCOUNTER — Other Ambulatory Visit: Payer: Self-pay | Admitting: Internal Medicine

## 2017-08-11 NOTE — Telephone Encounter (Signed)
Last filled 06-30-17 #90 Last OV 06-16-17 Next OV 06-23-18

## 2017-08-27 NOTE — Progress Notes (Signed)
Cardiology Office Note   Date:  08/28/2017   ID:  Kyle [name], DOB 04-Feb-1932, MRN [id number]  PCP:  Venia Carbon, MD  Cardiologist:   Minus Breeding, MD    Chief Complaint  Patient presents with  . Atrial Fibrillation         History of Present Illness: Kyle [name] is a 84 y.o. male who presents for follow up of CAD.   The patient was at [facility]-03/03/15 for NSTEMI, new systolic CHF and new onset atrial fibrillation. 2D echo showed an EF of 15%. Subsequent LHC showed CAD with 75% stenosed mid LAD s/p PCI with stent plus diffuse disease in the left PDA with an 80% lesion in the proximal nondominant RCA.  His EF did improve to 35 - 40%.  Since I last saw him he has done relatively well.  He has to take care of his wife who has Alzheimer's.  He does chores around the house and gets around slowly with his cane.  He has some help from his children.  He denies any acute cardiovascular symptoms. The patient denies any new symptoms such as chest discomfort, neck or arm discomfort. There has been no new shortness of breath, PND or orthopnea. There have been no reported palpitations, presyncope or syncope.   Past Medical History:  Diagnosis Date  . Arthritis   . Cancer [facility])    prostate CA  . Claustrophobia    "EXTREMELY" (03/02/2015)  . Detached retina    right  . GERD (gastroesophageal reflux disease)   . H/O dizziness   . History of prostate cancer   . Hyperlipidemia   . Hypertension   . Impaired fasting glucose   . Stroke [facility]) ~ 1995-02/2009 X 2   /notes 08/22/2010  . Urgency of urination     Past Surgical History:  Procedure Laterality Date  . CARDIAC CATHETERIZATION  ~ 1995   Archie Endo 08/22/2010  . CARDIAC CATHETERIZATION N/A 03/02/2015   Procedure: Left Heart Cath and Coronary Angiography;  Surgeon: Jettie Booze, MD;  Location: [place] CV LAB;  Service: Cardiovascular;  Laterality: N/A;  . CARDIAC CATHETERIZATION  03/02/2015   Procedure: Coronary  Stent Intervention;  Surgeon: Jettie Booze, MD;  Location: [place] CV LAB;  Service: Cardiovascular;;  . COLONOSCOPY W/ POLYPECTOMY    . ESOPHAGOGASTRODUODENOSCOPY (EGD) WITH PROPOFOL N/A 01/31/2017   Procedure: ESOPHAGOGASTRODUODENOSCOPY (EGD) WITH PROPOFOL;  Surgeon: Lucilla Lame, MD;  Location[facility] ENDOSCOPY;  Service: Endoscopy;  Laterality: N/A;  . EYE SURGERY Right   . HERNIA REPAIR     hx UHR/notes 08/22/2010  . INGUINAL HERNIA REPAIR Left    Archie Endo 08/22/2010  . LUMBAR LAMINECTOMY/DECOMPRESSION MICRODISCECTOMY  01/24/2012   Procedure: LUMBAR LAMINECTOMY/DECOMPRESSION MICRODISCECTOMY 1 LEVEL;  Surgeon: Winfield Cunas, MD;  Location: [place] NEURO ORS;  Service: Neurosurgery;  Laterality: Right;  RIGHT Lumbar Three-Four far lateral diskectomy  . [place]   Archie Endo 08/22/2010  . RETINAL DETACHMENT REPAIR W/ SCLERAL BUCKLE LE Right 08/2006   Archie Endo 08/22/2010  . TONSILLECTOMY    . UMBILICAL HERNIA REPAIR     hx/notes 08/22/2010     Current Outpatient Medications  Medication Sig Dispense Refill  . albuterol (PROVENTIL HFA;VENTOLIN HFA) 108 (90 Base) MCG/ACT inhaler Inhale 2 puffs into the lungs every 6 (six) hours as needed for wheezing or shortness of breath. 1 Inhaler 0  . apixaban (ELIQUIS) 5 MG TABS tablet Take 1 tablet (5 mg total) by mouth 2 (  two) times daily. 60 tablet 11  . atorvastatin (LIPITOR) 80 MG tablet TAKE ONE TABLET BY MOUTH EVERY DAY AT 6PM 30 tablet 11  . colchicine 0.6 MG tablet Take 1 tablet (0.6 mg total) by mouth 2 (two) times daily as needed. For joint swelling 60 tablet 1  . furosemide (LASIX) 20 MG tablet TAKE ONE TABLET BY MOUTH EVERY DAY 30 tablet 9  . losartan (COZAAR) 50 MG tablet TAKE ONE TABLET BY MOUTH EVERY DAY 90 tablet 3  . pantoprazole (PROTONIX) 40 MG tablet TAKE ONE TABLET BY MOUTH TWICE DAILY BEFORE MEALS 60 tablet 4  . traMADol (ULTRAM) 50 MG tablet TAKE ONE TABLET BY MOUTH 3 TIMES DAILY AS NEEDED 90 tablet 0   No current  facility-administered medications for this visit.     Allergies:   Simvastatin     ROS:  Please see the history of present illness.   Otherwise, review of systems are positive for none.   All other systems are reviewed and negative.    PHYSICAL EXAM: VS:  BP (!) 148/76   Pulse 63   Ht 6' (1.829 m)   Wt 189 lb (85.7 kg)   SpO2 98%   BMI 25.63 kg/m  , BMI Body mass index is 25.63 kg/m.  GENERAL:  Well appearing NECK:  No jugular venous distention, waveform within normal limits, carotid upstroke brisk and symmetric, no bruits, no thyromegaly LUNGS:  Clear to auscultation bilaterally CHEST:  Unremarkable HEART:  PMI not displaced or sustained,S1 and S2 within normal limits, no S3, no S4, no clicks, no rubs, 2 out of 6 early to mid peaking systolic murmur radiating slightly at the aortic outflow tract, no diastolic murmurs ABD:  Flat, positive bowel sounds normal in frequency in pitch, no bruits, no rebound, no guarding, no midline pulsatile mass, no hepatomegaly, no splenomegaly EXT:  2 plus pulses throughout, no edema, no cyanosis no clubbing, swelling in the right knee slightly    EKG:  EKG is not  ordered today.    Recent Labs: 02/21/2017: B Natriuretic Peptide 177.0; Magnesium 1.6; TSH 0.615 06/16/2017: ALT 6; BUN 10; Creatinine, Ser 1.21; Hemoglobin 11.5; Platelets 269.0; Potassium 3.6; Sodium 142    Lipid Panel    Component Value Date/Time   CHOL 134 10/25/2016 1156   TRIG 61 10/25/2016 1156   TRIG 119 05/01/2006 1043   HDL 66 10/25/2016 1156   CHOLHDL 2.0 10/25/2016 1156   CHOLHDL 3.7 03/01/2015 0327   VLDL 13 03/01/2015 0327   LDLCALC 56 10/25/2016 1156   LDLDIRECT 164.4 10/10/2010 1012      Wt Readings from Last 3 Encounters:  08/28/17 189 lb (85.7 kg)  06/16/17 189 lb (85.7 kg)  03/24/17 198 lb 12.8 oz (90.2 kg)      Other studies Reviewed: Additional studies/ records that were reviewed today include: Labs Review of the above records demonstrates:        ASSESSMENT AND PLAN:  CAD:   The patient has no new sypmtoms.  No further cardiovascular testing is indicated.  We will continue with aggressive risk reduction and meds as listed.  ISCHEMIC CARDIOMYOPATHY:     I will check an echocardiogram but he does not want to have this until he comes back in 6 months.  I am going to hold off on beta-blockers because they were stopped in the past presumably because of low blood pressures.  With his frailty and moving very slowly on medications.  In particular he is been fairly  asymptomatic.  ATRIAL FIB:   Kyle [name] has a CHA2DS2 - VASc score of 7.  No change in therapy.   HTN:    The blood pressure is mildly elevated but again, he has been sensitive to meds.  No change in therapy.   DYSLIPIDEMIA:   He has a reasonable lipid profile.  No change in therapy.  AS:  I will follow this with echo.   Current medicines are reviewed at length with the patient today.  The patient does not have concerns regarding medicines.  The following changes have been made:   None  Labs/ tests ordered today include:     Orders Placed This Encounter  Procedures  . ECHOCARDIOGRAM COMPLETE     Disposition:   FU with me in 6 months.     Signed, Minus Breeding, MD  08/28/2017 11:09 AM    [facility]

## 2017-08-28 ENCOUNTER — Ambulatory Visit (INDEPENDENT_AMBULATORY_CARE_PROVIDER_SITE_OTHER): Payer: Medicare Other | Admitting: Cardiology

## 2017-08-28 ENCOUNTER — Encounter: Payer: Self-pay | Admitting: Cardiology

## 2017-08-28 VITALS — BP 148/76 | HR 63 | Ht 72.0 in | Wt 189.0 lb

## 2017-08-28 DIAGNOSIS — E785 Hyperlipidemia, unspecified: Secondary | ICD-10-CM | POA: Insufficient documentation

## 2017-08-28 DIAGNOSIS — I350 Nonrheumatic aortic (valve) stenosis: Secondary | ICD-10-CM

## 2017-08-28 DIAGNOSIS — I255 Ischemic cardiomyopathy: Secondary | ICD-10-CM | POA: Diagnosis not present

## 2017-08-28 DIAGNOSIS — I10 Essential (primary) hypertension: Secondary | ICD-10-CM

## 2017-08-28 DIAGNOSIS — I480 Paroxysmal atrial fibrillation: Secondary | ICD-10-CM

## 2017-08-28 DIAGNOSIS — I2510 Atherosclerotic heart disease of native coronary artery without angina pectoris: Secondary | ICD-10-CM | POA: Diagnosis not present

## 2017-08-28 NOTE — Patient Instructions (Signed)
Medication Instructions:  Continue current medications  If you need a refill on your cardiac medications before your next appointment, please call your pharmacy.  Labwork: None Ordered   Testing/Procedures: Your physician has requested that you have an echocardiogram 6 Months. Echocardiography is a painless test that uses sound waves to create images of your heart. It provides your doctor with information about the size and shape of your heart and how well your heart's chambers and valves are working. This procedure takes approximately one hour. There are no restrictions for this procedure.   Follow-Up: Your physician wants you to follow-up in: 6 Months. You should receive a reminder letter in the mail two months in advance. If you do not receive a letter, please call our office [phone].      Thank you for choosing [facility] at [facility]!!      \

## 2017-09-08 ENCOUNTER — Other Ambulatory Visit: Payer: Self-pay | Admitting: Internal Medicine

## 2017-09-09 NOTE — Telephone Encounter (Signed)
Last filled 08-11-17 #90 Last OV 06-16-17 Next OV 06-23-18

## 2017-10-20 ENCOUNTER — Other Ambulatory Visit: Payer: Self-pay | Admitting: Internal Medicine

## 2017-10-20 NOTE — Telephone Encounter (Signed)
Last filled 09-09-17 #90 Last OV 06-16-17 Next OV 06-30-18  [facility]

## 2017-11-26 ENCOUNTER — Telehealth: Payer: Self-pay | Admitting: Pharmacist Clinician (PhC)/ Clinical Pharmacy Specialist

## 2017-11-26 NOTE — Telephone Encounter (Signed)
Received fax from patient phamacy, has hit coverage gap on Medicare, price of Eliquis went to $120 per month (prev was $40).  Patient cannot afford.    Spoke with pharmacy and confirmed that this is 1 month supply not 3 month and patient is indeed in coverage gap.  Also spoke with patient, gave him phone number to call Eliquis assistance program.  Patient is elderly and confused by this, gave me permission to speak with his son [name].  Called [name] and explained situation.  Son was understanding and will call Eliquis (with father in the room to give verbal okay).  They will then call us to let us know if there are problems getting the medication for the remainder of the year.  Pt picked up a 30 day supply about a week ago, so has about 3 weeks before runs out.

## 2017-12-01 ENCOUNTER — Telehealth: Payer: Self-pay | Admitting: Internal Medicine

## 2017-12-01 NOTE — Telephone Encounter (Signed)
Patient had his AWV Feb,2019.  Patient has next AWV scheduled in March,2020.

## 2017-12-01 NOTE — Telephone Encounter (Signed)
Last filled 10-20-17 #90 Last OV 06-16-17 No Future OV

## 2017-12-01 NOTE — Telephone Encounter (Signed)
He will need yearly wellness visit--he probably should set this up soon

## 2017-12-02 ENCOUNTER — Other Ambulatory Visit: Payer: Self-pay

## 2017-12-02 MED ORDER — APIXABAN 5 MG PO TABS: 5.0000 mg | ORAL_TABLET | Freq: Two times a day (BID) | ORAL | 6 refills | Status: DC

## 2017-12-29 ENCOUNTER — Other Ambulatory Visit: Payer: Self-pay | Admitting: Internal Medicine

## 2017-12-29 ENCOUNTER — Other Ambulatory Visit: Payer: Self-pay | Admitting: Cardiology

## 2017-12-29 NOTE — Telephone Encounter (Signed)
Rx request sent to pharmacy.  

## 2017-12-29 NOTE — Telephone Encounter (Signed)
Last filled 12-01-17 #90 Last OV 06-16-17 Next OV 06-30-18

## 2018-01-12 ENCOUNTER — Other Ambulatory Visit: Payer: Self-pay | Admitting: Internal Medicine

## 2018-01-26 ENCOUNTER — Other Ambulatory Visit: Payer: Self-pay | Admitting: Internal Medicine

## 2018-01-26 NOTE — Telephone Encounter (Signed)
Last prescribed on 12/29/2017 #90 Last office visit on 06/16/2017. Next office visit on 06/30/2018

## 2018-02-24 DIAGNOSIS — Z23 Encounter for immunization: Secondary | ICD-10-CM | POA: Diagnosis not present

## 2018-03-03 ENCOUNTER — Other Ambulatory Visit (HOSPITAL_COMMUNITY): Payer: Medicare Other

## 2018-03-23 ENCOUNTER — Other Ambulatory Visit: Payer: Self-pay | Admitting: Internal Medicine

## 2018-03-23 NOTE — Telephone Encounter (Signed)
Last filled 01-26-18 #90 Last OV 06-16-17 Next OV 06-30-18 [facility]

## 2018-04-06 ENCOUNTER — Other Ambulatory Visit: Payer: Self-pay | Admitting: Cardiology

## 2018-04-06 DIAGNOSIS — I5021 Acute systolic (congestive) heart failure: Secondary | ICD-10-CM

## 2018-05-04 ENCOUNTER — Other Ambulatory Visit: Payer: Self-pay | Admitting: Internal Medicine

## 2018-05-04 NOTE — Telephone Encounter (Signed)
Last filled 03-23-18 #90 Last OV 06-16-17 Next OV 06-30-18 [facility]

## 2018-05-24 NOTE — Progress Notes (Signed)
Cardiology Office Note   Date:  05/25/2018   ID:  Kyle [name], DOB 1932/02/17, MRN [id number]  PCP:  Venia Carbon, MD  Cardiologist:   Minus Breeding, MD    Chief Complaint  Patient presents with  . Atrial Fibrillation         History of Present Illness: Kyle [name] is a 85 y.o. male who presents for follow up of CAD.   The patient was at [facility] 02/27/15-03/03/15 for NSTEMI, new systolic CHF and new onset atrial fibrillation. 2D echo showed an EF of 15%. Subsequent LHC showed CAD with 75% stenosed mid LAD s/p PCI with stent plus diffuse disease in the left PDA with an 80% lesion in the proximal nondominant RCA.  His EF did improve to 35 - 40%.    Since I last saw him he has done well.  He gets around slowly.  He still takes care of his wife who has Alzheimer's.  He does do chores and walks to the grocery store.  He moves slowly.  Gets dyspneic with significant exertion but he does not describe resting shortness of breath, PND or orthopnea.  He does not have presyncope or syncope.  He occasionally feels palpitations.   Past Medical History:  Diagnosis Date  . Arthritis   . Cancer [facility])    prostate CA  . Claustrophobia    "EXTREMELY" (03/02/2015)  . Detached retina    right  . GERD (gastroesophageal reflux disease)   . H/O dizziness   . History of prostate cancer   . Hyperlipidemia   . Hypertension   . Impaired fasting glucose   . Stroke [facility]) ~ 1995-02/2009 X 2   /notes 08/22/2010  . Urgency of urination     Past Surgical History:  Procedure Laterality Date  . CARDIAC CATHETERIZATION  ~ 1995   Archie Endo 08/22/2010  . CARDIAC CATHETERIZATION N/A 03/02/2015   Procedure: Left Heart Cath and Coronary Angiography;  Surgeon: Jettie Booze, MD;  Location: [place] CV LAB;  Service: Cardiovascular;  Laterality: N/A;  . CARDIAC CATHETERIZATION  03/02/2015   Procedure: Coronary Stent Intervention;  Surgeon: Jettie Booze, MD;  Location: [place] CV LAB;   Service: Cardiovascular;;  . COLONOSCOPY W/ POLYPECTOMY    . ESOPHAGOGASTRODUODENOSCOPY (EGD) WITH PROPOFOL N/A 01/31/2017   Procedure: ESOPHAGOGASTRODUODENOSCOPY (EGD) WITH PROPOFOL;  Surgeon: Lucilla Lame, MD;  Location: [facility] ENDOSCOPY;  Service: Endoscopy;  Laterality: N/A;  . EYE SURGERY Right   . HERNIA REPAIR     hx UHR/notes 08/22/2010  . INGUINAL HERNIA REPAIR Left    Archie Endo 08/22/2010  . LUMBAR LAMINECTOMY/DECOMPRESSION MICRODISCECTOMY  01/24/2012   Procedure: LUMBAR LAMINECTOMY/DECOMPRESSION MICRODISCECTOMY 1 LEVEL;  Surgeon: Winfield Cunas, MD;  Location: [place] NEURO ORS;  Service: Neurosurgery;  Laterality: Right;  RIGHT Lumbar Three-Four far lateral diskectomy  . [place]   Archie Endo 08/22/2010  . RETINAL DETACHMENT REPAIR W/ SCLERAL BUCKLE LE Right 08/2006   Archie Endo 08/22/2010  . TONSILLECTOMY    . UMBILICAL HERNIA REPAIR     hx/notes 08/22/2010     Current Outpatient Medications  Medication Sig Dispense Refill  . albuterol (PROVENTIL HFA;VENTOLIN HFA) 108 (90 Base) MCG/ACT inhaler Inhale 2 puffs into the lungs every 6 (six) hours as needed for wheezing or shortness of breath. 1 Inhaler 0  . apixaban (ELIQUIS) 5 MG TABS tablet Take 1 tablet (5 mg total) by mouth 2 (two) times daily. 60 tablet 6  . atorvastatin (LIPITOR) 80 MG  tablet TAKE ONE TABLET BY MOUTH EVERY DAY AT 6PM 30 tablet 11  . COLCRYS 0.6 MG tablet TAKE 1 TABLET BY MOUTH TWICE DAILY AS NEEDED FOR JOINT SWELLING 60 tablet 0  . furosemide (LASIX) 20 MG tablet TAKE ONE TABLET BY MOUTH EVERY DAY 30 tablet 9  . losartan (COZAAR) 50 MG tablet TAKE ONE TABLET BY MOUTH EVERY DAY 90 tablet 0  . pantoprazole (PROTONIX) 40 MG tablet TAKE ONE TABLET BY MOUTH TWICE DAILY BEFORE MEALS 60 tablet 4  . pantoprazole (PROTONIX) 40 MG tablet TAKE ONE TABLET TWICE DAILY BEFORE MEALS 60 tablet 6  . traMADol (ULTRAM) 50 MG tablet TAKE ONE TABLET 3 TIMES DAILY 90 tablet 0  . carvedilol (COREG) 3.125 MG tablet Take 1 tablet (3.125 mg  total) by mouth 2 (two) times daily. 180 tablet 3   No current facility-administered medications for this visit.     Allergies:   Simvastatin     ROS:  Please see the history of present illness.   Otherwise, review of systems are positive for none.   All other systems are reviewed and negative.    PHYSICAL EXAM: VS:  BP 110/70   Pulse (!) 113   Ht 6' (1.829 m)   Wt 181 lb (82.1 kg)   SpO2 97%   BMI 24.55 kg/m  , BMI Body mass index is 24.55 kg/m.  GENERAL:  Well appearing NECK:  No jugular venous distention, waveform within normal limits, carotid upstroke brisk and symmetric, no bruits, no thyromegaly LUNGS:  Clear to auscultation bilaterally CHEST:  Unremarkable HEART:  PMI not displaced or sustained,S1 and S2 within normal limits, no S3,  no clicks, no rubs, 2 out of 6 apical systolic murmur radiating slightly at the aortic outflow tract, no diastolic murmurs, irregular ABD:  Flat, positive bowel sounds normal in frequency in pitch, no bruits, no rebound, no guarding, no midline pulsatile mass, no hepatomegaly, no splenomegaly EXT:  2 plus pulses throughout, no edema, no cyanosis no clubbing   EKG:  EKG is  ordered today. Atrial fibrillation, left axis deviation, left anterior fascicular block, premature ectopic complexes, left ventricular hypertrophy by voltage criteria, old anteroseptal infarct.   Recent Labs: 06/16/2017: ALT 6; BUN 10; Creatinine, Ser 1.21; Hemoglobin 11.5; Platelets 269.0; Potassium 3.6; Sodium 142    Lipid Panel    Component Value Date/Time   CHOL 134 10/25/2016 1156   TRIG 61 10/25/2016 1156   TRIG 119 05/01/2006 1043   HDL 66 10/25/2016 1156   CHOLHDL 2.0 10/25/2016 1156   CHOLHDL 3.7 03/01/2015 0327   VLDL 13 03/01/2015 0327   LDLCALC 56 10/25/2016 1156   LDLDIRECT 164.4 10/10/2010 1012      Wt Readings from Last 3 Encounters:  05/25/18 181 lb (82.1 kg)  08/28/17 189 lb (85.7 kg)  06/16/17 189 lb (85.7 kg)      Other studies  Reviewed: Additional studies/ records that were reviewed today include:   Labs Review of the above records demonstrates:      ASSESSMENT AND PLAN:  CAD:    The patient has no ongoing symptoms.  He will continue with risk reduction.  ISCHEMIC CARDIOMYOPATHY:      He lives in [place].  I would like to try to switch his care over there as it would be much more convenient for him and his family.  I am going to order an echocardiogram which was to be done but which was canceled because of transportation.  I am also  going to add carvedilol 3.125 mg twice daily.  I have been moving very slowly on his medications.   ATRIAL FIB:   Mr. Kyle [name] has a CHA2DS2 - VASc score of 7.  He is in atrial fibrillation today but was in sinus the time before this.  He continues with anticoagulation.   HTN:    The blood pressure is borderline.  We will move slowly with his meds.    DYSLIPIDEMIA:    His lipid profile is at target although it could be checked the next time he comes from an appointment.  We will put this in the computer and ask him to go fasting.   AS:    This will be followed on the echo.    Current medicines are reviewed at length with the patient today.  The patient does not have concerns regarding medicines.  The following changes have been made:   As above.    Labs/ tests ordered today include:     Orders Placed This Encounter  Procedures  . Lipid panel  . Hepatic function panel  . EKG 12-Lead  . ECHOCARDIOGRAM COMPLETE     Disposition:   FU with me [name] NP in 2 months.     Signed, Minus Breeding, MD  05/25/2018 9:52 AM    [facility]

## 2018-05-25 ENCOUNTER — Encounter: Payer: Self-pay | Admitting: Cardiology

## 2018-05-25 ENCOUNTER — Ambulatory Visit (INDEPENDENT_AMBULATORY_CARE_PROVIDER_SITE_OTHER): Payer: Medicare Other | Admitting: Cardiology

## 2018-05-25 VITALS — BP 110/70 | HR 113 | Ht 72.0 in | Wt 181.0 lb

## 2018-05-25 DIAGNOSIS — I350 Nonrheumatic aortic (valve) stenosis: Secondary | ICD-10-CM

## 2018-05-25 DIAGNOSIS — E785 Hyperlipidemia, unspecified: Secondary | ICD-10-CM | POA: Diagnosis not present

## 2018-05-25 DIAGNOSIS — I480 Paroxysmal atrial fibrillation: Secondary | ICD-10-CM

## 2018-05-25 DIAGNOSIS — Z79899 Other long term (current) drug therapy: Secondary | ICD-10-CM

## 2018-05-25 DIAGNOSIS — I255 Ischemic cardiomyopathy: Secondary | ICD-10-CM | POA: Diagnosis not present

## 2018-05-25 MED ORDER — CARVEDILOL 3.125 MG PO TABS: 3.1250 mg | ORAL_TABLET | Freq: Two times a day (BID) | ORAL | 3 refills | Status: DC

## 2018-05-25 NOTE — Patient Instructions (Addendum)
Medication Instructions:  START- Carvedilol 3.125 mg twice a day  If you need a refill on your cardiac medications before your next appointment, please call your pharmacy.  Labwork: Fasting Lipids and liver  Take the provided lab slips with you to the lab for your blood draw.   When you have your labs (blood work) drawn today and your tests are completely normal, you will receive your results only by MyChart Message (if you have MyChart) -OR-  A paper copy in the mail.  If you have any lab test that is abnormal or we need to change your treatment, we will call you to review these results.  Testing/Procedures: Your physician has requested that you have an echocardiogram in [place]. Echocardiography is a painless test that uses sound waves to create images of your heart. It provides your doctor with information about the size and shape of your heart and how well your heart's chambers and valves are working. This procedure takes approximately one hour. There are no restrictions for this procedure.   Follow-Up: You will need a follow up appointment in 2 months Murray Hodgkins, NP.   At [facility], you and your health needs are our priority.  As part of our continuing mission to provide you with exceptional heart care, we have created designated Provider Care Teams.  These Care Teams include your primary Cardiologist (physician) and Advanced Practice Providers (APPs -  Physician Assistants and Nurse Practitioners) who all work together to provide you with the care you need, when you need it.  Thank you for choosing [facility] at [facility]!!

## 2018-06-15 ENCOUNTER — Other Ambulatory Visit: Payer: Self-pay | Admitting: Cardiology

## 2018-06-15 ENCOUNTER — Other Ambulatory Visit: Payer: Self-pay | Admitting: Internal Medicine

## 2018-06-15 NOTE — Telephone Encounter (Signed)
Last filled 03-23-18 #90 Last OV 06-16-17 Next OV 06-30-18 [facility]

## 2018-06-23 ENCOUNTER — Encounter: Payer: Medicare Other | Admitting: Internal Medicine

## 2018-06-25 ENCOUNTER — Ambulatory Visit (INDEPENDENT_AMBULATORY_CARE_PROVIDER_SITE_OTHER): Payer: Medicare Other

## 2018-06-25 DIAGNOSIS — I255 Ischemic cardiomyopathy: Secondary | ICD-10-CM

## 2018-06-29 ENCOUNTER — Other Ambulatory Visit: Payer: Self-pay | Admitting: Cardiology

## 2018-06-30 ENCOUNTER — Encounter: Payer: Self-pay | Admitting: Internal Medicine

## 2018-06-30 ENCOUNTER — Ambulatory Visit (INDEPENDENT_AMBULATORY_CARE_PROVIDER_SITE_OTHER): Payer: Medicare Other | Admitting: Internal Medicine

## 2018-06-30 VITALS — BP 140/88 | HR 52 | Temp 97.5°F | Ht 70.0 in | Wt 181.0 lb

## 2018-06-30 DIAGNOSIS — N183 Chronic kidney disease, stage 3 unspecified: Secondary | ICD-10-CM

## 2018-06-30 DIAGNOSIS — I255 Ischemic cardiomyopathy: Secondary | ICD-10-CM | POA: Diagnosis not present

## 2018-06-30 DIAGNOSIS — Z0000 Encounter for general adult medical examination without abnormal findings: Secondary | ICD-10-CM | POA: Diagnosis not present

## 2018-06-30 DIAGNOSIS — I480 Paroxysmal atrial fibrillation: Secondary | ICD-10-CM

## 2018-06-30 DIAGNOSIS — M11261 Other chondrocalcinosis, right knee: Secondary | ICD-10-CM

## 2018-06-30 DIAGNOSIS — Z7189 Other specified counseling: Secondary | ICD-10-CM | POA: Diagnosis not present

## 2018-06-30 DIAGNOSIS — I5022 Chronic systolic (congestive) heart failure: Secondary | ICD-10-CM | POA: Diagnosis not present

## 2018-06-30 DIAGNOSIS — L570 Actinic keratosis: Secondary | ICD-10-CM | POA: Diagnosis not present

## 2018-06-30 DIAGNOSIS — K2100 Gastro-esophageal reflux disease with esophagitis, without bleeding: Secondary | ICD-10-CM

## 2018-06-30 DIAGNOSIS — I25119 Atherosclerotic heart disease of native coronary artery with unspecified angina pectoris: Secondary | ICD-10-CM | POA: Diagnosis not present

## 2018-06-30 LAB — RENAL FUNCTION PANEL
Albumin: 3.9 g/dL (ref 3.5–5.2)
BUN: 12 mg/dL (ref 6–23)
CO2: 29 mEq/L (ref 19–32)
Calcium: 9.1 mg/dL (ref 8.4–10.5)
Chloride: 106 mEq/L (ref 96–112)
Creatinine, Ser: 1.07 mg/dL (ref 0.40–1.50)
GFR: 65.66 mL/min (ref >60.00)
Glucose, Bld: 99 mg/dL (ref 70–99)
Phosphorus: 3.4 mg/dL (ref 2.3–4.6)
Potassium: 3.6 mEq/L (ref 3.5–5.1)
Sodium: 142 mEq/L (ref 135–145)

## 2018-06-30 LAB — HEPATIC FUNCTION PANEL
ALT: 5 U/L (ref 0–53)
AST: 13 U/L (ref 0–37)
Albumin: 3.9 g/dL (ref 3.5–5.2)
Alkaline Phosphatase: 74 U/L (ref 39–117)
Bilirubin, Direct: 0.1 mg/dL (ref 0.0–0.3)
Total Bilirubin: 0.6 mg/dL (ref 0.2–1.2)
Total Protein: 7.7 g/dL (ref 6.0–8.3)

## 2018-06-30 LAB — LIPID PANEL
Cholesterol: 115 mg/dL (ref 0–200)
HDL: 57.4 mg/dL (ref >39.00)
LDL Cholesterol: 48 mg/dL (ref 0–99)
NonHDL: 57.74
Total CHOL/HDL Ratio: 2
Triglycerides: 51 mg/dL (ref 0.0–149.0)
VLDL: 10.2 mg/dL (ref 0.0–40.0)

## 2018-06-30 LAB — CBC
HCT: 34.6 % — ABNORMAL LOW (ref 39.0–52.0)
Hemoglobin: 11.1 g/dL — ABNORMAL LOW (ref 13.0–17.0)
MCHC: 32 g/dL (ref 30.0–36.0)
MCV: 80.5 fl (ref 78.0–100.0)
Platelets: 244 10*3/uL (ref 150.0–400.0)
RBC: 4.3 Mil/uL (ref 4.22–5.81)
RDW: 18.2 % — ABNORMAL HIGH (ref 11.5–15.5)
WBC: 8.1 10*3/uL (ref 4.0–10.5)

## 2018-06-30 LAB — T4, FREE: Free T4: 0.82 ng/dL (ref 0.60–1.60)

## 2018-06-30 MED ORDER — COLCHICINE 0.6 MG PO TABS: ORAL_TABLET | ORAL | 3 refills | Status: DC

## 2018-06-30 NOTE — Assessment & Plan Note (Signed)
Mostly controlled with PPI 

## 2018-06-30 NOTE — Assessment & Plan Note (Signed)
Rate is fine Sounds regular with skips now--but could be a fib

## 2018-06-30 NOTE — Assessment & Plan Note (Signed)
Borderline On ARB Will recheck

## 2018-06-30 NOTE — Assessment & Plan Note (Signed)
Discussed options Verbal consent Liquid nitrogen 30 seconds x 2 Tolerated well Discussed home care Derm if recurs

## 2018-06-30 NOTE — Assessment & Plan Note (Signed)
EF actually better On beta blocker, ARB, diuretic

## 2018-06-30 NOTE — Progress Notes (Signed)
Hearing Screening   Method: Audiometry   125Hz  250Hz  500Hz  1000Hz  2000Hz  3000Hz  4000Hz  6000Hz  8000Hz   Right ear:   25 40 40  0    Left ear:   25 25 40  0      Visual Acuity Screening   Right eye Left eye Both eyes  Without correction:     With correction: 20/50 20/30 20/30

## 2018-06-30 NOTE — Assessment & Plan Note (Signed)
I have personally reviewed the Medicare Annual Wellness questionnaire and have noted 1. The patient's medical and social history 2. Their use of alcohol, tobacco or illicit drugs 3. Their current medications and supplements 4. The patient's functional ability including ADL's, fall risks, home safety risks and hearing or visual             impairment. 5. Diet and physical activities 6. Evidence for depression or mood disorders  The patients weight, height, BMI and visual acuity have been recorded in the chart I have made referrals, counseling and provided education to the patient based review of the above and I have provided the pt with a written personalized care plan for preventive services.  I have provided you with a copy of your personalized plan for preventive services. Please take the time to review along with your updated medication list.  No cancer screening due to age Major issue is caregiver stress for wife's dementia Continue to walk Consider shingrix Mild cognitive issues are stable

## 2018-06-30 NOTE — Assessment & Plan Note (Signed)
See social history 

## 2018-06-30 NOTE — Progress Notes (Signed)
Subjective:    Patient ID: Kyle [name], male    DOB: 06-11-1931, 85 y.o.   MRN: [id number]  HPI  Here for Medicare wellness visit and follow up of chronic health conditions Reviewed form and advanced directives Reviewed other doctors No alcohol or tobacco Tries to walk regularly Only drives very short distances Son does shopping, brings food--they just warms it up He does laundry, cleaning ---wife with Alzheimer's (though she does ADLs) [place] once this year--- no injury (misstep) No depression but does feel restricted Vision and hearing are okay Mild memory issues are stable---son does all bills, etc  Recent cardiology follow up Echo shows LV function up to 40-45% Gets occasional palpitations No chest pain--but stable DOE (brief walking, etc) No dizziness or syncope No edema of note  Ongoing knee pain at times The colcrys really helps for prn  Has new lesion lateral to right eye Does bother him some Goes back a couple of months and is growing  Renal function stable for some time--high 50's or 60 Is on losartan  Voids okay Stream is fair Nocturia x 2  Current Outpatient Medications on File Prior to Visit  Medication Sig Dispense Refill  . albuterol (PROVENTIL HFA;VENTOLIN HFA) 108 (90 Base) MCG/ACT inhaler Inhale 2 puffs into the lungs every 6 (six) hours as needed for wheezing or shortness of breath. 1 Inhaler 0  . apixaban (ELIQUIS) 5 MG TABS tablet Take 1 tablet (5 mg total) by mouth 2 (two) times daily. 60 tablet 6  . atorvastatin (LIPITOR) 80 MG tablet TAKE ONE TABLET BY MOUTH EVERY DAY AT 6PM 30 tablet 11  . carvedilol (COREG) 3.125 MG tablet Take 1 tablet (3.125 mg total) by mouth 2 (two) times daily. 180 tablet 3  . COLCRYS 0.6 MG tablet TAKE 1 TABLET BY MOUTH TWICE DAILY AS NEEDED FOR JOINT SWELLING 60 tablet 0  . furosemide (LASIX) 20 MG tablet TAKE ONE TABLET EVERY DAY 30 tablet 11  . losartan (COZAAR) 50 MG tablet TAKE ONE TABLET BY MOUTH EVERY DAY 90  tablet 0  . pantoprazole (PROTONIX) 40 MG tablet TAKE ONE TABLET TWICE DAILY BEFORE MEALS 60 tablet 6  . traMADol (ULTRAM) 50 MG tablet TAKE ONE TABLET 3 TIMES DAILY 90 tablet 0   No current facility-administered medications on file prior to visit.     Allergies  Allergen Reactions  . Simvastatin     REACTION: myalgias    Past Medical History:  Diagnosis Date  . Arthritis   . Cancer [facility])    prostate CA  . Claustrophobia    "EXTREMELY" (03/02/2015)  . Detached retina    right  . GERD (gastroesophageal reflux disease)   . H/O dizziness   . History of prostate cancer   . Hyperlipidemia   . Hypertension   . Impaired fasting glucose   . Stroke [facility]) ~ 1995-02/2009 X 2   /notes 08/22/2010  . Urgency of urination     Past Surgical History:  Procedure Laterality Date  . CARDIAC CATHETERIZATION  ~ 1995   Archie Endo 08/22/2010  . CARDIAC CATHETERIZATION N/A 03/02/2015   Procedure: Left Heart Cath and Coronary Angiography;  Surgeon: Jettie Booze, MD;  Location: [place] CV LAB;  Service: Cardiovascular;  Laterality: N/A;  . CARDIAC CATHETERIZATION  03/02/2015   Procedure: Coronary Stent Intervention;  Surgeon: Jettie Booze, MD;  Location: [place] CV LAB;  Service: Cardiovascular;;  . COLONOSCOPY W/ POLYPECTOMY    . ESOPHAGOGASTRODUODENOSCOPY (EGD) WITH PROPOFOL N/A  01/31/2017   Procedure: ESOPHAGOGASTRODUODENOSCOPY (EGD) WITH PROPOFOL;  Surgeon: Lucilla Lame, MD;  Location: [facility] ENDOSCOPY;  Service: Endoscopy;  Laterality: N/A;  . EYE SURGERY Right   . HERNIA REPAIR     hx UHR/notes 08/22/2010  . INGUINAL HERNIA REPAIR Left    Archie Endo 08/22/2010  . LUMBAR LAMINECTOMY/DECOMPRESSION MICRODISCECTOMY  01/24/2012   Procedure: LUMBAR LAMINECTOMY/DECOMPRESSION MICRODISCECTOMY 1 LEVEL;  Surgeon: Winfield Cunas, MD;  Location: [place] NEURO ORS;  Service: Neurosurgery;  Laterality: Right;  RIGHT Lumbar Three-Four far lateral diskectomy  . [place]   Archie Endo 08/22/2010    . RETINAL DETACHMENT REPAIR W/ SCLERAL BUCKLE LE Right 08/2006   Archie Endo 08/22/2010  . TONSILLECTOMY    . UMBILICAL HERNIA REPAIR     hx/notes 08/22/2010    Family History  Problem Relation Age of Onset  . Heart disease Mother   . Coronary artery disease Mother   . Heart disease Father   . Coronary artery disease Father   . Heart disease Brother   . Heart disease Brother   . Cancer Brother        prostate  . Diabetes Neg Hx     Social History   Socioeconomic History  . Marital status: Married    Spouse name: Not on file  . Number of children: 1  . Years of education: Not on file  . Highest education level: Not on file  Occupational History  . Occupation: retired    Fish farm manager: RETIRED  Social Needs  . Financial resource strain: Not on file  . Food insecurity:    Worry: Not on file    Inability: Not on file  . Transportation needs:    Medical: Not on file    Non-medical: Not on file  Tobacco Use  . Smoking status: Never Smoker  . Smokeless tobacco: Former Systems developer    Types: Chew  Substance and Sexual Activity  . Alcohol use: No  . Drug use: No  . Sexual activity: Not on file  Lifestyle  . Physical activity:    Days per week: Not on file    Minutes per session: Not on file  . Stress: Not on file  Relationships  . Social connections:    Talks on phone: Not on file    Gets together: Not on file    Attends religious service: Not on file    Active member of club or organization: Not on file    Attends meetings of clubs or organizations: Not on file    Relationship status: Not on file  . Intimate partner violence:    Fear of current or ex partner: Not on file    Emotionally abused: Not on file    Physically abused: Not on file    Forced sexual activity: Not on file  Other Topics Concern  . Not on file  Social History Narrative   Has living will   Son is now his health care POA   Would accept CPR but no prolonged machines.    Would not want a feeding tube if  cognitively unaware         Review of Systems Appetite is stable Weight is down 8# in past year Sleep is not great--trouble initiating. Sleeps in bed, on side. No PND Wears seat belt mostly Teeth are okay--no recent dental visits. Full dentures No other skin lesions Bowels are okay--no blood Rare heartburn ---no dysphagia Some balance issues--uses cane for walking No other joint pains  Objective:   Physical Exam  Constitutional: He is oriented to person, place, and time. He appears well-developed. No distress.  HENT:  Mouth/Throat: Oropharynx is clear and moist. No oropharyngeal exudate.  Neck: No thyromegaly present.  Cardiovascular: Normal rate.  Slightly irregular Soft coarse aortic systolic murmur Faint pedal pulses  Respiratory: Effort normal and breath sounds normal. No respiratory distress. He has no wheezes. He has no rales.  GI: Soft. There is no abdominal tenderness.  Musculoskeletal:        General: No tenderness or edema.  Lymphadenopathy:    He has no cervical adenopathy.  Neurological: He is alert and oriented to person, place, and time.  President--- "Trump, Bush---black guy" [phone] D- "no" Recall 3/3  Skin:  79mm mildly inflamed actinic lateral to right eye  Psychiatric: He has a normal mood and affect. His behavior is normal.           Assessment & Plan:

## 2018-06-30 NOTE — Assessment & Plan Note (Signed)
Stable DOE On appropriate regimen

## 2018-06-30 NOTE — Telephone Encounter (Signed)
Rx(s) sent to pharmacy electronically.  

## 2018-06-30 NOTE — Assessment & Plan Note (Signed)
Colchicine is helpful for this

## 2018-07-13 ENCOUNTER — Other Ambulatory Visit: Payer: Self-pay | Admitting: Cardiology

## 2018-07-13 DIAGNOSIS — I5021 Acute systolic (congestive) heart failure: Secondary | ICD-10-CM

## 2018-07-16 ENCOUNTER — Telehealth: Payer: Self-pay

## 2018-07-16 NOTE — Telephone Encounter (Signed)
TELEPHONE CALL NOTE  Kyle [name] has been deemed a candidate for a follow-up tele-health visit to limit community exposure during the Covid-19 pandemic. I spoke with the patient via phone to ensure availability of phone/video source, confirm preferred email & phone number, and discuss instructions and expectations.  I reminded Kyle [name] to be prepared with any vital sign and/or heart rhythm information that could potentially be obtained via home monitoring, at the time of his visit. I reminded Kyle [name] to expect an e-mail containing a link for their video-based visit approximately 15 minutes before his visit, or alternatively, a phone call at the time of his visit if his visit is planned to be a phone encounter.  STAFF MUST READ CONSENT VERBATIM TO PATIENT BELOW - Did the patient verbally consent to treatment as below? [place], [place] 07/16/2018 9:47 AM   CONSENT FOR TELE-HEALTH VISIT - PLEASE REVIEW  I hereby voluntarily request, consent and authorize [facility] and its employed or contracted physicians, physician assistants, nurse practitioners or other licensed health care professionals (the Practitioner), to provide me with telemedicine health care services (the "Services") as deemed necessary by the treating Practitioner. I acknowledge and consent to receive the Services by the Practitioner via telemedicine. I understand that the telemedicine visit will involve communicating with the Practitioner through live audiovisual communication technology and the disclosure of certain medical information by electronic transmission. I acknowledge that I have been given the opportunity to request an in-person assessment or other available alternative prior to the telemedicine visit and am voluntarily participating in the telemedicine visit.  I understand that I have the right to withhold or withdraw my consent to the use of telemedicine in the course of my care at any time,  without affecting my right to future care or treatment, and that the Practitioner or I may terminate the telemedicine visit at any time. I understand that I have the right to inspect all information obtained and/or recorded in the course of the telemedicine visit and may receive copies of available information for a reasonable fee.  I understand that some of the potential risks of receiving the Services via telemedicine include:  [name] Delay or interruption in medical evaluation due to technological equipment failure or disruption; . Information transmitted may not be sufficient (e.g. poor resolution of images) to allow for appropriate medical decision making by the Practitioner; and/or  . In rare instances, security protocols could fail, causing a breach of personal health information.  Furthermore, I acknowledge that it is my responsibility to provide information about my medical history, conditions and care that is complete and accurate to the best of my ability. I acknowledge that Practitioner's advice, recommendations, and/or decision may be based on factors not within their control, such as incomplete or inaccurate data provided by me or distortions of diagnostic images or specimens that may result from electronic transmissions. I understand that the practice of medicine is not an exact science and that Practitioner makes no warranties or guarantees regarding treatment outcomes. I acknowledge that I will receive a copy of this consent concurrently upon execution via email to the email address I last provided but may also request a printed copy by calling the office of [place].    I understand that my insurance will be billed for this visit.   I have read or had this consent read to me. . I understand the contents of this consent, which adequately explains the benefits and  risks of the Services being provided via telemedicine.  . I have been provided ample opportunity to ask questions regarding  this consent and the Services and have had my questions answered to my satisfaction. . I give my informed consent for the services to be provided through the use of telemedicine in my medical care  By participating in this telemedicine visit I agree to the above.

## 2018-07-23 ENCOUNTER — Other Ambulatory Visit: Payer: Self-pay

## 2018-07-23 ENCOUNTER — Telehealth (INDEPENDENT_AMBULATORY_CARE_PROVIDER_SITE_OTHER): Payer: Medicare Other | Admitting: Nurse Practitioner

## 2018-07-23 ENCOUNTER — Encounter: Payer: Self-pay | Admitting: Nurse Practitioner

## 2018-07-23 VITALS — BP 111/64 | Ht 69.5 in | Wt 191.0 lb

## 2018-07-23 DIAGNOSIS — I10 Essential (primary) hypertension: Secondary | ICD-10-CM

## 2018-07-23 DIAGNOSIS — I2510 Atherosclerotic heart disease of native coronary artery without angina pectoris: Secondary | ICD-10-CM

## 2018-07-23 DIAGNOSIS — I5022 Chronic systolic (congestive) heart failure: Secondary | ICD-10-CM

## 2018-07-23 DIAGNOSIS — E785 Hyperlipidemia, unspecified: Secondary | ICD-10-CM

## 2018-07-23 DIAGNOSIS — I255 Ischemic cardiomyopathy: Secondary | ICD-10-CM

## 2018-07-23 DIAGNOSIS — I480 Paroxysmal atrial fibrillation: Secondary | ICD-10-CM

## 2018-07-23 NOTE — Progress Notes (Signed)
Virtual Visit via Telephone Note   Evaluation Performed:  Follow-up visit  This visit type was conducted due to national recommendations for restrictions regarding the COVID-19 Pandemic (e.g. social distancing).  This format is felt to be most appropriate for this patient at this time.  All issues noted in this document were discussed and addressed.  No physical exam was performed (except for noted visual exam findings with Video Visits).  Please refer to the patient's chart (MyChart message for video visits and phone note for telephone visits) for the patient's consent to telehealth for [facility]. _____________   Date:  07/23/2018   Patient ID:  Kyle [name], DOB 1931/05/21, MRN [id number] Patient Location:  Home Provider location:   Out of office  Primary Care Provider:  Venia Carbon, MD Primary Cardiologist:  Formerly J. Hochrein, MD. Will f/u in [employer] going forward  Chief Complaint    85 year old male with a history of coronary artery disease status post prior non-STEMI and LAD stenting in 2016, ischemic cardiomyopathy with an EF of 40 to 45%, HFrEF, paroxysmal atrial fibrillation on Eliquis, hypertension, hyperlipidemia, prior cerebellar stroke, and prostate cancer, who was contacted today for a telephonic visit to follow-up heart failure and CAD.  Past Medical History    Past Medical History:  Diagnosis Date  . Arthritis   . CAD (coronary artery disease)    a. 02/2015 NSTEMI/PCI: LM nl, LAd 31m (2.5x16 Synergy DES), D1 50ost, D2 50ost, RI 25ost, LCX 25ost/p, 75d (small), LPDA 90 (small), RCA 80p (small).  . Cancer [facility])    prostate CA  . Claustrophobia    "EXTREMELY" (03/02/2015)  . Detached retina    right  . GERD (gastroesophageal reflux disease)   . H/O dizziness   . HFrEF (heart failure with reduced ejection fraction) ([place])    a. 02/2015 Echo: EF 15-20%; b. 05/2015 Echo: EF 35-40%; c. 06/2018 Echo: EF 40-45%, DD, Nl RV fxn. RVSP 37.3mmHg. Mild LAE. Mild  AS/AI.  [name] History of prostate cancer   . Hyperlipidemia   . Hypertension   . Impaired fasting glucose   . Ischemic cardiomyopathy    a. 02/2015 Echo: EF 15-20%; b. 05/2015 Echo: EF 35-40%; c. 06/2018 Echo: EF 40-45%.  [name] PAF (paroxysmal atrial fibrillation) ([place])    a. Rate controlled. CHA2DS2VASc = 7--> Eliquis.  . Stroke [facility]) ~ 1995-02/2009 X 2   a. 02/2008 MRI brain: prior infarct involving R lbe of cerebellum w/ encephalomalcic change.  . Urgency of urination    Past Surgical History:  Procedure Laterality Date  . CARDIAC CATHETERIZATION  ~ 1995   Archie Endo 08/22/2010  . CARDIAC CATHETERIZATION N/A 03/02/2015   Procedure: Left Heart Cath and Coronary Angiography;  Surgeon: Jettie Booze, MD;  Location: [place] CV LAB;  Service: Cardiovascular;  Laterality: N/A;  . CARDIAC CATHETERIZATION  03/02/2015   Procedure: Coronary Stent Intervention;  Surgeon: Jettie Booze, MD;  Location: [place] CV LAB;  Service: Cardiovascular;;  . COLONOSCOPY W/ POLYPECTOMY    . ESOPHAGOGASTRODUODENOSCOPY (EGD) WITH PROPOFOL N/A 01/31/2017   Procedure: ESOPHAGOGASTRODUODENOSCOPY (EGD) WITH PROPOFOL;  Surgeon: Lucilla Lame, MD;  Location: [facility] ENDOSCOPY;  Service: Endoscopy;  Laterality: N/A;  . EYE SURGERY Right   . HERNIA REPAIR     hx UHR/notes 08/22/2010  . INGUINAL HERNIA REPAIR Left    Archie Endo 08/22/2010  . LUMBAR LAMINECTOMY/DECOMPRESSION MICRODISCECTOMY  01/24/2012   Procedure: LUMBAR LAMINECTOMY/DECOMPRESSION MICRODISCECTOMY 1 LEVEL;  Surgeon: Winfield Cunas, MD;  Location: [place]  ORS;  Service: Neurosurgery;  Laterality: Right;  RIGHT Lumbar Three-Four far lateral diskectomy  . [employer]   Archie Endo 08/22/2010  . RETINAL DETACHMENT REPAIR W/ SCLERAL BUCKLE LE Right 08/2006   Archie Endo 08/22/2010  . TONSILLECTOMY    . UMBILICAL HERNIA REPAIR     hx/notes 08/22/2010    Allergies  Allergies  Allergen Reactions  . Simvastatin     REACTION: myalgias    History of Present  Illness    Kyle [name] is a 85 y.o. male who presents via audio conferencing for a telehealth visit today.  As above, he has a prior history of coronary artery disease and is status post non-STEMI in the setting of rapid atrial fibrillation in 2016.  He was found to have LV dysfunction with an EF of 15 to 20% at that time and subsequent catheterization revealed severe multivessel disease.  The culprit was felt to be the LAD and this was successfully treated with a drug-eluting stent.  He has residual small vessel circumflex, L PDA, and RCA disease.  He has been medically managed over the years and has not required any additional ischemic evaluation.  LV function has improved, with an echocardiogram in March of this year revealing an EF of 40 to 45%, up from 35 to 40% in 2017.  He has been chronically anticoagulated with Eliquis therapy.  Other history includes hypertension, hyperlipidemia, prior cerebellar stroke, and prostate cancer.  He has previously been followed by Dr. Percival Spanish in [place] but as he lives in [place], he is now transitioning care to our office here.  He was last seen in clinic in February, at which time he was doing well.  Low-dose carvedilol therapy was added to his regimen and showed moderate LV function with an EF of 40 to 45% as outlined above.  He lives at home in [place] with his wife, who is suffering from Alzheimer's.  In the setting of the COVID-19 pandemic he had not seen and limiting contact with family.  His son does his grocery shopping and drops food off for them.  He has tolerated carvedilol therapy well and his blood pressures have been trending in the 1 teens to 120s.  His weight has been stable at 191 pounds on his home scales.  He denies chest pain, palpitations, PND, dyspnea, orthopnea, dizziness, syncope, edema, or early satiety.  He does have oral Lasix to be used at home on an as-needed basis and says sometimes weeks go by before he requires a dose.  The  patient does not have symptoms concerning for COVID-19 infection (fever, chills, cough, or new shortness of breath).   Home Medications    Prior to Admission medications   Medication Sig Start Date End Date Taking? Authorizing Provider  albuterol (PROVENTIL HFA;VENTOLIN HFA) 108 (90 Base) MCG/ACT inhaler Inhale 2 puffs into the lungs every 6 (six) hours as needed for wheezing or shortness of breath. 02/01/17  Yes Sudini, Alveta Heimlich, MD  apixaban (ELIQUIS) 5 MG TABS tablet Take 1 tablet (5 mg total) by mouth 2 (two) times daily. 12/02/17  Yes Minus Breeding, MD  atorvastatin (LIPITOR) 80 MG tablet TAKE ONE TABLET BY MOUTH EVERY DAY AT 6PM 07/15/17  Yes Lelon Perla, MD  carvedilol (COREG) 3.125 MG tablet Take 1 tablet (3.125 mg total) by mouth 2 (two) times daily. 05/25/18 08/23/18 Yes Hochrein, [name], MD  colchicine (COLCRYS) 0.6 MG tablet TAKE 1 TABLET BY MOUTH TWICE DAILY AS NEEDED FOR JOINT SWELLING 06/30/18  Yes Venia Carbon, MD  furosemide (LASIX) 20 MG tablet TAKE ONE TABLET EVERY DAY 06/15/18  Yes Minus Breeding, MD  losartan (COZAAR) 50 MG tablet TAKE ONE TABLET BY MOUTH EVERY DAY 07/13/18  Yes Minus Breeding, MD  pantoprazole (PROTONIX) 40 MG tablet Take 1 tablet (40 mg total) by mouth 2 (two) times daily before a meal. 06/30/18  Yes Hochrein, [name], MD  traMADol (ULTRAM) 50 MG tablet TAKE ONE TABLET 3 TIMES DAILY Patient taking differently: Take 50 mg by mouth 3 (three) times daily as needed.  06/15/18  Yes Venia Carbon, MD    Review of Systems    He denies chest pain, palpitations, dyspnea, pnd, orthopnea, n, v, dizziness, syncope, edema, weight gain, or early satiety. No fever, chills, cough, myalgias, or anosmia.  All other systems reviewed and are otherwise negative except as noted above.  Physical Exam    Vital Signs:  BP 111/64 (BP Location: Left Arm, Patient Position: Sitting)   Ht 5' 9.5" (1.765 m)   Wt 191 lb (86.6 kg)   BMI 27.80 kg/m    AAOx3.  Pleasant.  Nl  affect. NAD noted via telephone.  Exam otw unable to be performed due to limitations of visit.   Accessory Clinical Findings    Lab Results  Component Value Date   WBC 8.1 06/30/2018   HGB 11.1 (L) 06/30/2018   HCT 34.6 (L) 06/30/2018   MCV 80.5 06/30/2018   PLT 244.0 06/30/2018   Lab Results  Component Value Date   CREATININE 1.07 06/30/2018   BUN 12 06/30/2018   NA 142 06/30/2018   K 3.6 06/30/2018   CL 106 06/30/2018   CO2 29 06/30/2018   Lab Results  Component Value Date   CHOL 115 06/30/2018   HDL 57.40 06/30/2018   LDLCALC 48 06/30/2018   LDLDIRECT 164.4 10/10/2010   TRIG 51.0 06/30/2018   CHOLHDL 2 06/30/2018    Lab Results  Component Value Date   ALT 5 06/30/2018   AST 13 06/30/2018   ALKPHOS 74 06/30/2018   BILITOT 0.6 06/30/2018    Assessment & Plan    1.  CAD: s/p prior NSTEMI w/ LAD DES in 2016.  He has done well w/o recurrent c/p or dyspnea since his last visit in Feb.  He remains on statin,  blocker (added @ last visit and tolerating well), and ARB rx.  No ASA in the setting of chronic eliquis.  2.  HFrEF/ICM:  EF sl improved on most recent echo in 06/2018 - EF 40-45%.  He has done well since his last visit w/o dyspnea, wt gain, or significant edema.  He rarely uses PO lasix (sometimes < 1x/wk).  He remains on  blocker and ARB and is tolerating well.  3.  PAF:  Asymptomatic.  Cont  blocker and eliquis.  H/H stable on recent eval.  Wt and creat cont to allow for eliquis 5 BID.  4.  Essential HTN:  Recent BP on home monitor 111/64.  Cont  blocker, ARB.  5.  HL:  On statin w/ LDL of 48 (@ goal). LFTs wnl.  COVID-19 Education: The signs and symptoms of COVID-19 were discussed with the patient and how to seek care for testing (follow up with PCP or arrange E-visit).  The importance of social distancing was discussed today.  Patient Risk:   After full review of this patient's history and clinical status, I feel that he is at least moderate risk for  cardiac complications  at this time, thus necessitating a telehealth visit sooner than our first available in office visit.  Time:   Today, I have spent 12 minutes with the patient with telehealth technology discussing his symptoms and plan of care.    Murray Hodgkins, NP 07/23/2018, 8:37 AM

## 2018-07-23 NOTE — Patient Instructions (Signed)
It was a pleasure to speak with you on the phone today! Thank you for allowing [place] to continue taking care of your [facility] needs during this time.   Feel free to call as needed for questions and concerns related to your cardiac needs.   Medication Instructions:  Your physician recommends that you continue on your current medications as directed. Please refer to the Current Medication list given to you today.  If you need a refill on your cardiac medications before your next appointment, please call your pharmacy.   Lab work: None ordered If you have labs (blood work) drawn today and your tests are completely normal, you will receive your results only by: [name] MyChart Message (if you have MyChart) OR . A paper copy in the mail If you have any lab test that is abnormal or we need to change your treatment, we will call you to review the results.  Testing/Procedures: None ordered   Follow-Up: At [facility], you and your health needs are our priority.  As part of our continuing mission to provide you with exceptional heart care, we have created designated Provider Care Teams.  These Care Teams include your primary Cardiologist (physician) and Advanced Practice Providers (APPs -  Physician Assistants and Nurse Practitioners) who all work together to provide you with the care you need, when you need it. You will need a follow up appointment in 2 months. Please establish a primary cardiologist at our [facility] location.  Any Other Special Instructions Will Be Listed Below (If Applicable). We have included a business card for [name], NP, as you requested.

## 2018-07-27 ENCOUNTER — Other Ambulatory Visit: Payer: Self-pay | Admitting: Internal Medicine

## 2018-07-27 NOTE — Telephone Encounter (Signed)
Last filled 06-15-18 #90 Last OV 06-30-18 Next OV 07-02-19 [facility]

## 2018-08-24 ENCOUNTER — Other Ambulatory Visit: Payer: Self-pay | Admitting: Internal Medicine

## 2018-08-24 NOTE — Telephone Encounter (Signed)
Last filled 07-27-18 #90 Last OV 06-30-18 Next OV 07-02-19 [facility]

## 2018-09-21 NOTE — Progress Notes (Signed)
Virtual Visit via Telephone Note   This visit type was conducted due to national recommendations for restrictions regarding the COVID-19 Pandemic (e.g. social distancing) in an effort to limit this patient's exposure and mitigate transmission in our community.  Due to his co-morbid illnesses, this patient is at least at moderate risk for complications without adequate follow up.  This format is felt to be most appropriate for this patient at this time.  The patient did not have access to video technology/had technical difficulties with video requiring transitioning to audio format only (telephone).  All issues noted in this document were discussed and addressed.  No physical exam could be performed with this format.  Please refer to the patient's chart for his  consent to telehealth for [facility].   I connected with  Kyle [name] on 09/22/18 by a video enabled telemedicine application and verified that I am speaking with the correct person using two identifiers. I discussed the limitations of evaluation and management by telemedicine. The patient expressed understanding and agreed to proceed.   Evaluation Performed:  Follow-up visit  Date:  09/22/2018   ID:  Kyle [name], DOB May 14, 1931, MRN [id number]  Patient Location:  [place] [address]   Provider location:   [facility] [place] office  PCP:  Venia Carbon, MD  Cardiologist:  Patsy Baltimore   Chief Complaint: No new complaints, some caretaker burnout taking care of wife, deconditioning   History of Present Illness:    Kyle [name] is a 85 y.o. male who presents via audio/video conferencing for a telehealth visit today.   The patient does not symptoms concerning for COVID-19 infection (fever, chills, cough, or new SHORTNESS OF BREATH).   Patient has a past medical history of coronary artery disease status post prior non-STEMI and LAD stenting in 2016, ischemic cardiomyopathy with an  EF of 40 to 45%,  HFrEF,  paroxysmal atrial fibrillation on Eliquis, hypertension,  hyperlipidemia,  prior cerebellar stroke,  prostate cancer,  Who presents for follow-up of his heart failure and CAD.  prior history of coronary artery disease and is status post non-STEMI in the setting of rapid atrial fibrillation in 2016.    LV dysfunction with an EF of 15 to 20% catheterization revealed severe multivessel disease.    The culprit was felt to be the LAD and this was successfully treated with a drug-eluting stent.  He has residual small vessel circumflex, L PDA, and RCA disease.  He has been medically managed over the years and has not required any additional ischemic evaluation.    LV function has improved, with an echocardiogram in March of this year revealing an EF of 40 to 45%, up from 35 to 40% in 2017.  He has been chronically anticoagulated with Eliquis therapy.  He lives at home in [place] with his wife, who is suffering from Alzheimer's.  No chest pain or SOB Does not get out much, Family nearby   Prior CV studies:   The following studies were reviewed today:  Echo 06/2018  1. The left ventricle has mild-moderately reduced systolic function, with an ejection fraction of 40-45%. The cavity size was normal. There is mildly increased left ventricular wall thickness. Left ventricular diastolic Doppler parameters are consistent  with pseudonormalization. Unable to exclude inferior wall hypokinesis.  2. The right ventricle has normal systolic function. The cavity was normal. There is no increase in right ventricular wall thickness. Right ventricular systolic  pressure is mildly elevated with an estimated pressure of 37.9 mmHg.  3. Left atrial size was mildly dilated.  4. Moderate calcification of the aortic valve. Aortic valve regurgitation is mild by color flow Doppler. mild stenosis of the aortic valve.  5. There is dilatation of the aortic root and of the ascending aorta.    Past Medical History:  Diagnosis Date  . Arthritis   . CAD (coronary artery disease)    a. 02/2015 NSTEMI/PCI: LM nl, LAd 47m (2.5x16 Synergy DES), D1 50ost, D2 50ost, RI 25ost, LCX 25ost/p, 75d (small), LPDA 90 (small), RCA 80p (small).  . Cancer [facility])    prostate CA  . Claustrophobia    "EXTREMELY" (03/02/2015)  . Detached retina    right  . GERD (gastroesophageal reflux disease)   . H/O dizziness   . HFrEF (heart failure with reduced ejection fraction) ([place])    a. 02/2015 Echo: EF 15-20%; b. 05/2015 Echo: EF 35-40%; c. 06/2018 Echo: EF 40-45%, DD, Nl RV fxn. RVSP 37.55mmHg. Mild LAE. Mild AS/AI.  [name] History of prostate cancer   . Hyperlipidemia   . Hypertension   . Impaired fasting glucose   . Ischemic cardiomyopathy    a. 02/2015 Echo: EF 15-20%; b. 05/2015 Echo: EF 35-40%; c. 06/2018 Echo: EF 40-45%.  [name] PAF (paroxysmal atrial fibrillation) ([place])    a. Rate controlled. CHA2DS2VASc = 7--> Eliquis.  . Stroke [facility]) ~ 1995-02/2009 X 2   a. 02/2008 MRI brain: prior infarct involving R lbe of cerebellum w/ encephalomalcic change.  . Urgency of urination    Past Surgical History:  Procedure Laterality Date  . CARDIAC CATHETERIZATION  ~ 1995   Archie Endo 08/22/2010  . CARDIAC CATHETERIZATION N/A 03/02/2015   Procedure: Left Heart Cath and Coronary Angiography;  Surgeon: Jettie Booze, MD;  Location: [place] CV LAB;  Service: Cardiovascular;  Laterality: N/A;  . CARDIAC CATHETERIZATION  03/02/2015   Procedure: Coronary Stent Intervention;  Surgeon: Jettie Booze, MD;  Location: [place] CV LAB;  Service: Cardiovascular;;  . COLONOSCOPY W/ POLYPECTOMY    . ESOPHAGOGASTRODUODENOSCOPY (EGD) WITH PROPOFOL N/A 01/31/2017   Procedure: ESOPHAGOGASTRODUODENOSCOPY (EGD) WITH PROPOFOL;  Surgeon: Lucilla Lame, MD;  Location: [facility] ENDOSCOPY;  Service: Endoscopy;  Laterality: N/A;  . EYE SURGERY Right   . HERNIA REPAIR     hx UHR/notes 08/22/2010  . INGUINAL HERNIA REPAIR Left    Archie Endo  08/22/2010  . LUMBAR LAMINECTOMY/DECOMPRESSION MICRODISCECTOMY  01/24/2012   Procedure: LUMBAR LAMINECTOMY/DECOMPRESSION MICRODISCECTOMY 1 LEVEL;  Surgeon: Winfield Cunas, MD;  Location: [place] NEURO ORS;  Service: Neurosurgery;  Laterality: Right;  RIGHT Lumbar Three-Four far lateral diskectomy  . [place]   Archie Endo 08/22/2010  . RETINAL DETACHMENT REPAIR W/ SCLERAL BUCKLE LE Right 08/2006   Archie Endo 08/22/2010  . TONSILLECTOMY    . UMBILICAL HERNIA REPAIR     hx/notes 08/22/2010     No outpatient medications have been marked as taking for the 09/22/18 encounter (Appointment) with Minna Merritts, MD.     Allergies:   Simvastatin   Social History   Tobacco Use  . Smoking status: Never Smoker  . Smokeless tobacco: Former Systems developer    Types: Chew  Substance Use Topics  . Alcohol use: No  . Drug use: No     Current Outpatient Medications on File Prior to Visit  Medication Sig Dispense Refill  . albuterol (PROVENTIL HFA;VENTOLIN HFA) 108 (90 Base) MCG/ACT inhaler Inhale 2 puffs into the lungs every 6 (  six) hours as needed for wheezing or shortness of breath. 1 Inhaler 0  . apixaban (ELIQUIS) 5 MG TABS tablet Take 1 tablet (5 mg total) by mouth 2 (two) times daily. 60 tablet 6  . atorvastatin (LIPITOR) 80 MG tablet TAKE ONE TABLET BY MOUTH EVERY DAY AT 6PM 30 tablet 11  . carvedilol (COREG) 3.125 MG tablet Take 1 tablet (3.125 mg total) by mouth 2 (two) times daily. 180 tablet 3  . colchicine (COLCRYS) 0.6 MG tablet TAKE 1 TABLET BY MOUTH TWICE DAILY AS NEEDED FOR JOINT SWELLING 60 tablet 3  . furosemide (LASIX) 20 MG tablet TAKE ONE TABLET EVERY DAY 30 tablet 11  . losartan (COZAAR) 50 MG tablet TAKE ONE TABLET BY MOUTH EVERY DAY 90 tablet 0  . pantoprazole (PROTONIX) 40 MG tablet Take 1 tablet (40 mg total) by mouth 2 (two) times daily before a meal. 60 tablet 11  . traMADol (ULTRAM) 50 MG tablet TAKE ONE TABLET BY MOUTH 3 TIMES DAILY AS NEEDED 90 tablet 0   No current  facility-administered medications on file prior to visit.      Family Hx: The patient's family history includes Cancer in his brother; Coronary artery disease in his father and mother; Heart disease in his brother, brother, father, and mother. There is no history of Diabetes.  ROS:   Please see the history of present illness.    Review of Systems  Constitutional: Negative.   HENT: Negative.   Respiratory: Negative.   Cardiovascular: Negative.   Gastrointestinal: Negative.   Musculoskeletal: Negative.   Neurological: Negative.   Psychiatric/Behavioral: Negative.   All other systems reviewed and are negative.     Labs/Other Tests and Data Reviewed:    Recent Labs: 06/30/2018: ALT 5; BUN 12; Creatinine, Ser 1.07; Hemoglobin 11.1; Platelets 244.0; Potassium 3.6; Sodium 142   Recent Lipid Panel Lab Results  Component Value Date/Time   CHOL 115 06/30/2018 08:57 AM   CHOL 134 10/25/2016 11:56 AM   TRIG 51.0 06/30/2018 08:57 AM   TRIG 119 05/01/2006 10:43 AM   HDL 57.40 06/30/2018 08:57 AM   HDL 66 10/25/2016 11:56 AM   CHOLHDL 2 06/30/2018 08:57 AM   LDLCALC 48 06/30/2018 08:57 AM   LDLCALC 56 10/25/2016 11:56 AM   LDLDIRECT 164.4 10/10/2010 10:12 AM    Wt Readings from Last 3 Encounters:  07/23/18 191 lb (86.6 kg)  06/30/18 181 lb (82.1 kg)  05/25/18 181 lb (82.1 kg)     Exam:    Vital Signs: Vital signs may also be detailed in the HPI There were no vitals taken for this visit.  Wt Readings from Last 3 Encounters:  07/23/18 191 lb (86.6 kg)  06/30/18 181 lb (82.1 kg)  05/25/18 181 lb (82.1 kg)   Temp Readings from Last 3 Encounters:  06/30/18 (!) 97.5 F (36.4 C) (Oral)  06/16/17 (!) 97.4 F (36.3 C) (Oral)  02/28/17 97.6 F (36.4 C) (Oral)   BP Readings from Last 3 Encounters:  07/23/18 111/64  06/30/18 140/88  05/25/18 110/70   Pulse Readings from Last 3 Encounters:  06/30/18 (!) 52  05/25/18 (!) 113  08/28/17 63      Well nourished, well  developed male in no acute distress. Constitutional:  oriented to person, place, and time. No distress.    ASSESSMENT & PLAN:    Atherosclerosis of native coronary artery of native heart with stable angina pectoris (HCC) Currently with no symptoms of angina. No further workup at this time.  Continue current medication regimen.  PAF (paroxysmal atrial fibrillation) (HCC) Denies any tachycardia or palpitations concerning for atrial fibrillation Tolerating Eliquis, very expensive  Chronic systolic heart failure ([place]) Reports he is euvolemic, does not want to change his medications No regular exercise program  Ischemic cardiomyopathy Recent echocardiogram results reviewed with him in detail, ejection fraction 40%, stable  Dyslipidemia Cholesterol is at goal on the current lipid regimen. No changes to the medications were made.  Essential hypertension, benign Blood pressure is well controlled on today's visit. No changes made to the medications.  Aortic valve stenosis, etiology of cardiac valve disease unspecified Mild aortic valve stenosis   COVID-19 Education: The signs and symptoms of COVID-19 were discussed with the patient and how to seek care for testing (follow up with PCP or arrange E-visit).  The importance of social distancing was discussed today.  Patient Risk:   After full review of this patients clinical status, I feel that they are at least moderate risk at this time.  Time:   Today, I have spent 25 minutes with the patient with telehealth technology discussing the cardiac and medical problems/diagnoses detailed above   10 min spent reviewing the chart prior to patient visit today   Medication Adjustments/Labs and Tests Ordered: Current medicines are reviewed at length with the patient today.  Concerns regarding medicines are outlined above.   Tests Ordered: No tests ordered   Medication Changes: No changes made   Disposition: Follow-up in 6 months    Signed, Ida Rogue, MD  09/22/2018 11:18 AM    [place] Office [address], [place], [place] [address]

## 2018-09-22 ENCOUNTER — Other Ambulatory Visit: Payer: Self-pay

## 2018-09-22 ENCOUNTER — Telehealth (INDEPENDENT_AMBULATORY_CARE_PROVIDER_SITE_OTHER): Payer: Medicare Other | Admitting: Cardiovascular Disease

## 2018-09-22 DIAGNOSIS — I480 Paroxysmal atrial fibrillation: Secondary | ICD-10-CM | POA: Diagnosis not present

## 2018-09-22 DIAGNOSIS — I10 Essential (primary) hypertension: Secondary | ICD-10-CM | POA: Diagnosis not present

## 2018-09-22 DIAGNOSIS — I25118 Atherosclerotic heart disease of native coronary artery with other forms of angina pectoris: Secondary | ICD-10-CM

## 2018-09-22 DIAGNOSIS — I5022 Chronic systolic (congestive) heart failure: Secondary | ICD-10-CM

## 2018-09-22 DIAGNOSIS — E785 Hyperlipidemia, unspecified: Secondary | ICD-10-CM | POA: Diagnosis not present

## 2018-09-22 DIAGNOSIS — I350 Nonrheumatic aortic (valve) stenosis: Secondary | ICD-10-CM | POA: Diagnosis not present

## 2018-09-22 DIAGNOSIS — I255 Ischemic cardiomyopathy: Secondary | ICD-10-CM

## 2018-09-22 NOTE — Patient Instructions (Addendum)
[place] [address]  [facility] at [facility] take elevator to the lower level.  Number: [phone] Fax: [phone]  Medication Instructions:  No changes  If you need a refill on your cardiac medications before your next appointment, please call your pharmacy.    Lab work: No new labs needed   If you have labs (blood work) drawn today and your tests are completely normal, you will receive your results only by: [name] MyChart Message (if you have MyChart) OR . A paper copy in the mail If you have any lab test that is abnormal or we need to change your treatment, we will call you to review the results.   Testing/Procedures: No new testing needed   Follow-Up: At [facility], you and your health needs are our priority.  As part of our continuing mission to provide you with exceptional heart care, we have created designated Provider Care Teams.  These Care Teams include your primary Cardiologist (physician) and Advanced Practice Providers (APPs -  Physician Assistants and Nurse Practitioners) who all work together to provide you with the care you need, when you need it.  . You will need a follow up appointment in 12 months .   Please call our office 2 months in advance to schedule this appointment.    . Providers on your designated Care Team:   . Murray Hodgkins, NP . Christell Faith, PA-C . Marrianne Mood, PA-C  Any Other Special Instructions Will Be Listed Below (If Applicable).  For educational health videos Log in to : [email or web address] Or : [email or web address], password : [email or web address]

## 2018-10-05 ENCOUNTER — Other Ambulatory Visit: Payer: Self-pay | Admitting: Internal Medicine

## 2018-10-05 ENCOUNTER — Other Ambulatory Visit: Payer: Self-pay | Admitting: Cardiology

## 2018-10-05 DIAGNOSIS — I5021 Acute systolic (congestive) heart failure: Secondary | ICD-10-CM

## 2018-10-05 NOTE — Telephone Encounter (Signed)
Please advise if ok to refill Losartan 50 mg tablet qd.

## 2018-10-05 NOTE — Telephone Encounter (Signed)
Pt is a 87yom wt of 82.1kg, scr of 1.07(06/30/18), lov w/ Dr. Rockey Situ (09/22/18) pt is requesting 5mg  eliquis refill should be authorized

## 2018-10-05 NOTE — Telephone Encounter (Signed)
Last filled 08-24-18 #90 Last OV 06-30-18 Next OV 07-02-19 [facility]

## 2018-10-05 NOTE — Telephone Encounter (Signed)
Please advise if ok to refill atorvastatin 80 mg tablet qd.

## 2018-11-03 ENCOUNTER — Other Ambulatory Visit: Payer: Self-pay | Admitting: Internal Medicine

## 2018-11-03 MED ORDER — TRAMADOL HCL 50 MG PO TABS: ORAL_TABLET | ORAL | 0 refills | Status: DC

## 2018-11-03 NOTE — Telephone Encounter (Signed)
Spoke to McIntyre. She said he got the rx in June as requested. She thinks he used an older bottle for the refill request instead of last months.

## 2018-11-03 NOTE — Telephone Encounter (Signed)
Best number [phone] Vivien Rota [facility] called wanting to clarify   They received a denial for  tramol rx  It stated this was already sent.   they have never received this rx  Pt resend

## 2018-11-30 ENCOUNTER — Other Ambulatory Visit: Payer: Self-pay | Admitting: Internal Medicine

## 2018-11-30 NOTE — Telephone Encounter (Signed)
Last filled 11-03-18 #90 Last OV 06-30-18 Next OV 07-02-19 [facility]

## 2019-01-11 ENCOUNTER — Other Ambulatory Visit: Payer: Self-pay | Admitting: Internal Medicine

## 2019-01-11 NOTE — Telephone Encounter (Signed)
Last filled 11-30-18 #90 Last OV 06-30-18 Next OV 07-02-19 [facility]

## 2019-02-09 DIAGNOSIS — Z23 Encounter for immunization: Secondary | ICD-10-CM | POA: Diagnosis not present

## 2019-02-22 ENCOUNTER — Other Ambulatory Visit: Payer: Self-pay | Admitting: Cardiology

## 2019-02-22 ENCOUNTER — Other Ambulatory Visit: Payer: Self-pay | Admitting: Internal Medicine

## 2019-02-22 NOTE — Telephone Encounter (Signed)
Last filled 01-11-19 #90 Last OV 06-30-18 Next OV 07-02-19 [facility]

## 2019-03-08 ENCOUNTER — Other Ambulatory Visit: Payer: Self-pay | Admitting: Internal Medicine

## 2019-03-09 ENCOUNTER — Other Ambulatory Visit: Payer: Self-pay | Admitting: Internal Medicine

## 2019-03-22 ENCOUNTER — Telehealth: Payer: Self-pay

## 2019-03-22 MED ORDER — TRAMADOL HCL 50 MG PO TABS: ORAL_TABLET | ORAL | 0 refills | Status: DC

## 2019-03-22 NOTE — Addendum Note (Signed)
Addended by: Viviana Simpler I on: 03/22/2019 12:38 PM   Modules accepted: Orders

## 2019-03-22 NOTE — Telephone Encounter (Signed)
[facility] contacted the office and stated patient needs a refill sent in on his Tramadol. Last refilled 02/23/19 for #90. Patient has upcoming CPE on 07/02/19. Ok to refill?

## 2019-05-03 ENCOUNTER — Other Ambulatory Visit: Payer: Self-pay | Admitting: Cardiovascular Disease

## 2019-05-03 ENCOUNTER — Other Ambulatory Visit: Payer: Self-pay | Admitting: Internal Medicine

## 2019-05-03 NOTE — Telephone Encounter (Signed)
Last filled 03-22-19 #90 Last OV 06-30-18 Next OV 07-02-19 [facility]

## 2019-05-03 NOTE — Telephone Encounter (Signed)
Pt's age 86, wt 86.6 kg, SCr 1.07, CrCl 61.83, last ov w/ TG 09/22/18.

## 2019-05-03 NOTE — Telephone Encounter (Signed)
Eliquis refill request.

## 2019-06-03 ENCOUNTER — Other Ambulatory Visit: Payer: Self-pay | Admitting: Internal Medicine

## 2019-06-03 NOTE — Telephone Encounter (Signed)
Last filled 05-03-19 #90 Last OV 06-30-18 Next OV 07-02-19 [facility]

## 2019-06-28 ENCOUNTER — Other Ambulatory Visit: Payer: Self-pay | Admitting: Internal Medicine

## 2019-06-28 NOTE — Telephone Encounter (Signed)
Last written 06-03-19 #90 Last OV 06-30-18 Next OV 07-02-19 [facility]

## 2019-07-02 ENCOUNTER — Other Ambulatory Visit: Payer: Self-pay

## 2019-07-02 ENCOUNTER — Encounter: Payer: Self-pay | Admitting: Internal Medicine

## 2019-07-02 ENCOUNTER — Ambulatory Visit (INDEPENDENT_AMBULATORY_CARE_PROVIDER_SITE_OTHER): Payer: Medicare Other | Admitting: Internal Medicine

## 2019-07-02 VITALS — BP 136/84 | HR 90 | Temp 97.3°F | Ht 70.0 in | Wt 175.0 lb

## 2019-07-02 DIAGNOSIS — R413 Other amnesia: Secondary | ICD-10-CM | POA: Diagnosis not present

## 2019-07-02 DIAGNOSIS — I5022 Chronic systolic (congestive) heart failure: Secondary | ICD-10-CM

## 2019-07-02 DIAGNOSIS — Z0000 Encounter for general adult medical examination without abnormal findings: Secondary | ICD-10-CM

## 2019-07-02 DIAGNOSIS — Z7189 Other specified counseling: Secondary | ICD-10-CM | POA: Diagnosis not present

## 2019-07-02 DIAGNOSIS — F39 Unspecified mood [affective] disorder: Secondary | ICD-10-CM

## 2019-07-02 DIAGNOSIS — I25118 Atherosclerotic heart disease of native coronary artery with other forms of angina pectoris: Secondary | ICD-10-CM | POA: Diagnosis not present

## 2019-07-02 DIAGNOSIS — I480 Paroxysmal atrial fibrillation: Secondary | ICD-10-CM | POA: Diagnosis not present

## 2019-07-02 DIAGNOSIS — G3184 Mild cognitive impairment, so stated: Secondary | ICD-10-CM | POA: Diagnosis not present

## 2019-07-02 LAB — RENAL FUNCTION PANEL
Albumin: 3.6 g/dL (ref 3.5–5.2)
BUN: 14 mg/dL (ref 6–23)
CO2: 31 mEq/L (ref 19–32)
Calcium: 8.9 mg/dL (ref 8.4–10.5)
Chloride: 101 mEq/L (ref 96–112)
Creatinine, Ser: 1.34 mg/dL (ref 0.40–1.50)
GFR: 50.53 mL/min — ABNORMAL LOW (ref >60.00)
Glucose, Bld: 93 mg/dL (ref 70–99)
Phosphorus: 4.2 mg/dL (ref 2.3–4.6)
Potassium: 3.5 mEq/L (ref 3.5–5.1)
Sodium: 138 mEq/L (ref 135–145)

## 2019-07-02 LAB — CBC
HCT: 28.6 % — ABNORMAL LOW (ref 39.0–52.0)
Hemoglobin: 8.8 g/dL — ABNORMAL LOW (ref 13.0–17.0)
MCHC: 30.9 g/dL (ref 30.0–36.0)
MCV: 81.3 fl (ref 78.0–100.0)
Platelets: 309 10*3/uL (ref 150.0–400.0)
RBC: 3.51 Mil/uL — ABNORMAL LOW (ref 4.22–5.81)
RDW: 19.4 % — ABNORMAL HIGH (ref 11.5–15.5)
WBC: 8.5 10*3/uL (ref 4.0–10.5)

## 2019-07-02 LAB — LIPID PANEL
Cholesterol: 92 mg/dL (ref 0–200)
HDL: 39.7 mg/dL (ref >39.00)
LDL Cholesterol: 41 mg/dL (ref 0–99)
NonHDL: 52.13
Total CHOL/HDL Ratio: 2
Triglycerides: 58 mg/dL (ref 0.0–149.0)
VLDL: 11.6 mg/dL (ref 0.0–40.0)

## 2019-07-02 LAB — HEPATIC FUNCTION PANEL
ALT: 6 U/L (ref 0–53)
AST: 13 U/L (ref 0–37)
Albumin: 3.6 g/dL (ref 3.5–5.2)
Alkaline Phosphatase: 76 U/L (ref 39–117)
Bilirubin, Direct: 0.3 mg/dL (ref 0.0–0.3)
Total Bilirubin: 0.8 mg/dL (ref 0.2–1.2)
Total Protein: 7.3 g/dL (ref 6.0–8.3)

## 2019-07-02 LAB — T4, FREE: Free T4: 1.21 ng/dL (ref 0.60–1.60)

## 2019-07-02 LAB — VITAMIN B12: Vitamin B-12: 133 pg/mL — ABNORMAL LOW (ref 211–911)

## 2019-07-02 NOTE — Assessment & Plan Note (Signed)
See social history 

## 2019-07-02 NOTE — Assessment & Plan Note (Signed)
Mostly compensated Needs furosemide if edema worsens On ARB, beta blocker, statin

## 2019-07-02 NOTE — Progress Notes (Signed)
Hearing Screening   Method: Audiometry   125Hz  250Hz  500Hz  1000Hz  2000Hz  3000Hz  4000Hz  6000Hz  8000Hz   Right ear:   25 40 40  0    Left ear:   25 25 25   0    Vision Screening Comments: April 2020

## 2019-07-02 NOTE — Patient Instructions (Addendum)
Please get your COVID vaccines. If you have ongoing nosebleed, I can set you up with an ENT specialist (or you can call [facility])

## 2019-07-02 NOTE — Assessment & Plan Note (Signed)
Now some worse--but seems to be related to grieving, etc Will check labs

## 2019-07-02 NOTE — Progress Notes (Signed)
Subjective:    Patient ID: Kyle [name], male    DOB: [birth year], 86 y.o.   MRN: [id number]  HPI Here for Medicare wellness visit and follow up of chronic health conditions  This visit occurred during the SARS-CoV-2 public health emergency.  Safety protocols were in place, including screening questions prior to the visit, additional usage of staff PPE, and extensive cleaning of exam room while observing appropriate contact time as indicated for disinfecting solutions.   Reviewed form and advanced directives Reviewed other doctors No alcohol or tobacco Hasn't been exercising--discussed Vision is okay Hearing is pretty good Did have 1 fall---tripped. No injury Having mood issues Mild memory problems seem stable  Wife died 1 month ago He is doing okay Still has son to do his shopping  Family takes care of most instrumental ADLs He does ADLs and heats up prepared food Has had depressed mood---relates to mourning Thinks about dying but no suicidal ideation Does see family daily Doesn't go out much  Does get occasional palpitations No major chest pain---he is vague about this Still gives out easy--- DOE after 5-10 minutes of walking No dizziness or syncope Occasional edema---uses furosemide prn  Ongoing knee pain Not too bad---uses the tramadol prn (not very helpful now)  Current Outpatient Medications on File Prior to Visit  Medication Sig Dispense Refill  . albuterol (PROVENTIL HFA;VENTOLIN HFA) 108 (90 Base) MCG/ACT inhaler Inhale 2 puffs into the lungs every 6 (six) hours as needed for wheezing or shortness of breath. 1 Inhaler 0  . apixaban (ELIQUIS) 5 MG TABS tablet Take 1 tablet (5 mg total) by mouth 2 (two) times daily. 180 tablet 0  . atorvastatin (LIPITOR) 80 MG tablet TAKE ONE TABLET EVERY DAY AT 6PM 90 tablet 3  . carvedilol (COREG) 3.125 MG tablet TAKE ONE TABLET TWICE DAILY 180 tablet 1  . colchicine (COLCRYS) 0.6 MG tablet TAKE 1 TABLET BY MOUTH TWICE DAILY AS  NEEDED FOR JOINT SWELLING 60 tablet 3  . furosemide (LASIX) 20 MG tablet TAKE ONE TABLET EVERY DAY 30 tablet 11  . losartan (COZAAR) 50 MG tablet TAKE ONE TABLET EVERY DAY 90 tablet 3  . pantoprazole (PROTONIX) 40 MG tablet Take 1 tablet (40 mg total) by mouth 2 (two) times daily before a meal. 60 tablet 11  . traMADol (ULTRAM) 50 MG tablet TAKE 1 TABLET BY MOUTH 3 TIMES DAILY AS NEEDED 90 tablet 0   No current facility-administered medications on file prior to visit.    Allergies  Allergen Reactions  . Simvastatin     REACTION: myalgias    Past Medical History:  Diagnosis Date  . Arthritis   . CAD (coronary artery disease)    a. 02/2015 NSTEMI/PCI: LM nl, LAd 1m (2.5x16 Synergy DES), D1 50ost, D2 50ost, RI 25ost, LCX 25ost/p, 75d (small), LPDA 90 (small), RCA 80p (small).  . Cancer [facility])    prostate CA  . Claustrophobia    "EXTREMELY" (03/02/2015)  . Detached retina    right  . GERD (gastroesophageal reflux disease)   . H/O dizziness   . HFrEF (heart failure with reduced ejection fraction) ([place])    a. 02/2015 Echo: EF 15-20%; b. 05/2015 Echo: EF 35-40%; c. 06/2018 Echo: EF 40-45%, DD, Nl RV fxn. RVSP 37.29mmHg. Mild LAE. Mild AS/AI.  [name] History of prostate cancer   . Hyperlipidemia   . Hypertension   . Impaired fasting glucose   . Ischemic cardiomyopathy    a. 02/2015 Echo: EF 15-20%; b.  05/2015 Echo: EF 35-40%; c. 06/2018 Echo: EF 40-45%.  [name] PAF (paroxysmal atrial fibrillation) ([place])    a. Rate controlled. CHA2DS2VASc = 7--> Eliquis.  . Stroke [facility]) ~ 1995-02/2009 X 2   a. 02/2008 MRI brain: prior infarct involving R lbe of cerebellum w/ encephalomalcic change.  . Urgency of urination     Past Surgical History:  Procedure Laterality Date  . CARDIAC CATHETERIZATION  ~ 1995   Archie Endo 08/22/2010  . CARDIAC CATHETERIZATION N/A 03/02/2015   Procedure: Left Heart Cath and Coronary Angiography;  Surgeon: Jettie Booze, MD;  Location: [place] CV LAB;  Service:  Cardiovascular;  Laterality: N/A;  . CARDIAC CATHETERIZATION  03/02/2015   Procedure: Coronary Stent Intervention;  Surgeon: Jettie Booze, MD;  Location: [place] CV LAB;  Service: Cardiovascular;;  . COLONOSCOPY W/ POLYPECTOMY    . ESOPHAGOGASTRODUODENOSCOPY (EGD) WITH PROPOFOL N/A 01/31/2017   Procedure: ESOPHAGOGASTRODUODENOSCOPY (EGD) WITH PROPOFOL;  Surgeon: Lucilla Lame, MD;  Location: [facility] ENDOSCOPY;  Service: Endoscopy;  Laterality: N/A;  . EYE SURGERY Right   . HERNIA REPAIR     hx UHR/notes 08/22/2010  . INGUINAL HERNIA REPAIR Left    Archie Endo 08/22/2010  . LUMBAR LAMINECTOMY/DECOMPRESSION MICRODISCECTOMY  01/24/2012   Procedure: LUMBAR LAMINECTOMY/DECOMPRESSION MICRODISCECTOMY 1 LEVEL;  Surgeon: Winfield Cunas, MD;  Location: [place] NEURO ORS;  Service: Neurosurgery;  Laterality: Right;  RIGHT Lumbar Three-Four far lateral diskectomy  . [place]   Archie Endo 08/22/2010  . RETINAL DETACHMENT REPAIR W/ SCLERAL BUCKLE LE Right 08/2006   Archie Endo 08/22/2010  . TONSILLECTOMY    . UMBILICAL HERNIA REPAIR     hx/notes 08/22/2010    Family History  Problem Relation Age of Onset  . Heart disease Mother   . Coronary artery disease Mother   . Heart disease Father   . Coronary artery disease Father   . Heart disease Brother   . Heart disease Brother   . Cancer Brother        prostate  . Diabetes Neg Hx     Social History   Socioeconomic History  . Marital status: Widowed    Spouse name: Not on file  . Number of children: 1  . Years of education: Not on file  . Highest education level: Not on file  Occupational History  . Occupation: retired    Fish farm manager: RETIRED  Tobacco Use  . Smoking status: Never Smoker  . Smokeless tobacco: Former Systems developer    Types: Chew  Substance and Sexual Activity  . Alcohol use: No  . Drug use: No  . Sexual activity: Not on file  Other Topics Concern  . Not on file  Social History Narrative   Wife died Jun 23, 2022   Has living will   Son is now  his health care POA   Would accept CPR but no prolonged machines.    Would not want a feeding tube if cognitively unaware         Social Determinants of Health   Financial Resource Strain:   . Difficulty of Paying Living Expenses:   Food Insecurity:   . Worried About Charity fundraiser in the Last Year:   . Arboriculturist in the Last Year:   Transportation Needs:   . Film/video editor (Medical):   [name] Lack of Transportation (Non-Medical):   Physical Activity:   . Days of Exercise per Week:   . Minutes of Exercise per Session:   Stress:   . Feeling  of Stress :   Social Connections:   . Frequency of Communication with Friends and Family:   . Frequency of Social Gatherings with Friends and Family:   . Attends Religious Services:   . Active Member of Clubs or Organizations:   . Attends Archivist Meetings:   [name] Marital Status:   Intimate Partner Violence:   . Fear of Current or Ex-Partner:   . Emotionally Abused:   [name] Physically Abused:   . Sexually Abused:    Review of Systems He feels he is eating well but not like before Has lost 6# Not sleeping well---has been sleeping a lot in the day Doesn't wear seat belt all the time--discussed (doesn't drive much) Teeth okay---full dentures No rash. Has itchy spot on side Nosebleed 2 weeks ago--3 different days Bowels are fine Voids okay. Flow is fine--seems to empty fine. Nocturia x 2---stable No heartburn or dysphagia    Objective:   Physical Exam  Constitutional: He appears well-developed. No distress.  HENT:  Mouth/Throat: Oropharynx is clear and moist. No oropharyngeal exudate.  Slight blood in posterior pharynx  Neck: No thyromegaly present.  Cardiovascular: Normal rate and regular rhythm. Exam reveals no gallop.  occ skips Faint pedal pulses Soft systolic murmur along left sternal border  Respiratory: Effort normal and breath sounds normal. No respiratory distress. He has no wheezes. He has no rales.    GI: Soft. There is no abdominal tenderness.  Musculoskeletal:        General: No tenderness.     Comments: 1+ pitting edema--ankles only  Lymphadenopathy:    He has no cervical adenopathy.  Neurological: He is alert.  March 2022 President---"Joe ?, Trump, colored guy" 100-93-? D-o-r? Recall 3/3  Skin: No rash noted. No erythema.  Psychiatric:  Seems mildly down--not overtly depressed Affect is appropriate           Assessment & Plan:

## 2019-07-02 NOTE — Assessment & Plan Note (Signed)
Still has quick DOE Discussed conditioning On appropriate meds

## 2019-07-02 NOTE — Assessment & Plan Note (Signed)
I have personally reviewed the Medicare Annual Wellness questionnaire and have noted 1. The patient's medical and social history 2. Their use of alcohol, tobacco or illicit drugs 3. Their current medications and supplements 4. The patient's functional ability including ADL's, fall risks, home safety risks and hearing or visual             impairment. 5. Diet and physical activities 6. Evidence for depression or mood disorders  The patients weight, height, BMI and visual acuity have been recorded in the chart I have made referrals, counseling and provided education to the patient based review of the above and I have provided the pt with a written personalized care plan for preventive services.  I have provided you with a copy of your personalized plan for preventive services. Please take the time to review along with your updated medication list.  Urged him to get COVID vaccine Needs to get out more Yearly flu vaccine Discussed exercise, seat belts

## 2019-07-02 NOTE — Assessment & Plan Note (Signed)
Grieving now Clearly some depression Fortunately he does see family daily Discussed COVID vaccine as the bridge to be able to get out more for social interaction

## 2019-07-02 NOTE — Assessment & Plan Note (Signed)
occ symptoms Regular now On eliquis

## 2019-07-03 ENCOUNTER — Other Ambulatory Visit: Payer: Self-pay | Admitting: Internal Medicine

## 2019-07-03 DIAGNOSIS — E538 Deficiency of other specified B group vitamins: Secondary | ICD-10-CM

## 2019-07-16 ENCOUNTER — Other Ambulatory Visit: Payer: Medicare Other

## 2019-07-19 ENCOUNTER — Other Ambulatory Visit: Payer: Self-pay | Admitting: Internal Medicine

## 2019-07-19 ENCOUNTER — Other Ambulatory Visit (INDEPENDENT_AMBULATORY_CARE_PROVIDER_SITE_OTHER): Payer: Medicare Other

## 2019-07-19 ENCOUNTER — Telehealth: Payer: Self-pay

## 2019-07-19 DIAGNOSIS — K922 Gastrointestinal hemorrhage, unspecified: Secondary | ICD-10-CM

## 2019-07-19 DIAGNOSIS — Z1211 Encounter for screening for malignant neoplasm of colon: Secondary | ICD-10-CM

## 2019-07-19 LAB — FECAL OCCULT BLOOD, IMMUNOCHEMICAL: Fecal Occult Bld: POSITIVE — AB

## 2019-07-19 NOTE — Telephone Encounter (Signed)
Please call him The fecal test did shows signs of blood---which is not unexpected. I do want him to see a GI specialist--does he prefer [facility]?

## 2019-07-19 NOTE — Telephone Encounter (Signed)
[facility] called in a critical result @ 1010  Positive IFOB

## 2019-07-19 NOTE — Telephone Encounter (Signed)
Anemic and positive FIT Needs GI evaluation

## 2019-07-19 NOTE — Telephone Encounter (Addendum)
Spoke to pt. He does want to see a GI in [place]. Morning appts are best. Pt is aware someone from our office or that office will contact him.

## 2019-07-19 NOTE — Addendum Note (Signed)
Addended by: Viviana Simpler I on: 07/19/2019 05:21 PM   Modules accepted: Orders

## 2019-07-26 ENCOUNTER — Other Ambulatory Visit: Payer: Self-pay | Admitting: Cardiology

## 2019-07-26 ENCOUNTER — Encounter: Payer: Self-pay | Admitting: *Deleted

## 2019-07-27 ENCOUNTER — Other Ambulatory Visit: Payer: Self-pay | Admitting: Internal Medicine

## 2019-08-05 DIAGNOSIS — H903 Sensorineural hearing loss, bilateral: Secondary | ICD-10-CM | POA: Diagnosis not present

## 2019-08-05 DIAGNOSIS — H6983 Other specified disorders of Eustachian tube, bilateral: Secondary | ICD-10-CM | POA: Diagnosis not present

## 2019-08-05 DIAGNOSIS — H6123 Impacted cerumen, bilateral: Secondary | ICD-10-CM | POA: Diagnosis not present

## 2019-08-09 ENCOUNTER — Other Ambulatory Visit: Payer: Self-pay | Admitting: Cardiovascular Disease

## 2019-08-09 ENCOUNTER — Other Ambulatory Visit: Payer: Self-pay | Admitting: Internal Medicine

## 2019-08-09 NOTE — Telephone Encounter (Signed)
Pt's age 86, wt 79.4 kg, SCr 1.34, CrCl 44.44, last ov w/ TG 10/11/18.

## 2019-08-09 NOTE — Telephone Encounter (Signed)
Last filled 07-05-19 #90 Last OV 07-02-19 Next OV 07-04-20 [facility]

## 2019-08-09 NOTE — Telephone Encounter (Signed)
Please reviewed verbally with patient

## 2019-08-23 ENCOUNTER — Other Ambulatory Visit: Payer: Self-pay

## 2019-08-23 ENCOUNTER — Other Ambulatory Visit: Payer: Self-pay | Admitting: Cardiology

## 2019-08-23 ENCOUNTER — Ambulatory Visit (INDEPENDENT_AMBULATORY_CARE_PROVIDER_SITE_OTHER): Payer: Medicare Other | Admitting: Gastroenterology

## 2019-08-23 ENCOUNTER — Telehealth: Payer: Self-pay

## 2019-08-23 ENCOUNTER — Encounter: Payer: Self-pay | Admitting: Gastroenterology

## 2019-08-23 VITALS — BP 113/73 | HR 112 | Temp 97.5°F | Wt 172.2 lb

## 2019-08-23 DIAGNOSIS — I25118 Atherosclerotic heart disease of native coronary artery with other forms of angina pectoris: Secondary | ICD-10-CM

## 2019-08-23 DIAGNOSIS — E538 Deficiency of other specified B group vitamins: Secondary | ICD-10-CM

## 2019-08-23 DIAGNOSIS — R195 Other fecal abnormalities: Secondary | ICD-10-CM | POA: Diagnosis not present

## 2019-08-23 DIAGNOSIS — D509 Iron deficiency anemia, unspecified: Secondary | ICD-10-CM

## 2019-08-23 MED ORDER — VITAMIN B-12 1000 MCG PO TABS: 1000.0000 ug | ORAL_TABLET | Freq: Every day | ORAL | 1 refills | Status: AC

## 2019-08-23 NOTE — Progress Notes (Signed)
Kyle Bellows MD, MRCP(U.K) [address]  [place]  [place], [place] [address]  Main: [phone]  Fax: [phone]   Gastroenterology Consultation  Referring Provider:     Venia Carbon, MD Primary Care Physician:  Kyle Carbon, MD Primary Gastroenterologist:  Dr. Jonathon [name]  Reason for Consultation:   GI bleed        HPI:   Kyle [name] is a 86 y.o. y/o male referred for consultation & management  by Dr. Venia Carbon, MD.    History seen in 2018 by Dr. Allen Norris after hospitalization with a GI bleed.  Found to have peptic ulcer disease at that point of time.  Attributed secondary to use of BC powders.  He was seen by Dr. Silvio Pate on 07/02/2019 for a routine general medical examination.  He is on Eliquis.Was found to be anemic and have a positive fit test.  Hemoglobin 8.8 g with an MCV of 81.3.  The year back hemoglobin is 11.1 g with an MCV of 80.5.  B12 was 133. He is accompanied by his son today.  He said that a few weeks back he had a lot of epistaxis and at 1 point had to call the EMS who applied a nasal spray and then it stopped.  He says he has been seen by ENT after that.  Denies any NSAID use.  Denies any rectal bleeding or blood in the urine.  Cannot recall last endoscopic evaluation.  No other complaints.  He does have numbness and tingling in his lower limbs. Past Medical History:  Diagnosis Date  . Arthritis   . CAD (coronary artery disease)    a. 02/2015 NSTEMI/PCI: LM nl, LAd 28m (2.5x16 Synergy DES), D1 50ost, D2 50ost, RI 25ost, LCX 25ost/p, 75d (small), LPDA 90 (small), RCA 80p (small).  . Cancer [facility])    prostate CA  . Claustrophobia    "EXTREMELY" (03/02/2015)  . Detached retina    right  . GERD (gastroesophageal reflux disease)   . H/O dizziness   . HFrEF (heart failure with reduced ejection fraction) ([place])    a. 02/2015 Echo: EF 15-20%; b. 05/2015 Echo: EF 35-40%; c. 06/2018 Echo: EF 40-45%, DD, Nl RV fxn. RVSP 37.32mmHg. Mild LAE. Mild AS/AI.    [name] History of prostate cancer   . Hyperlipidemia   . Hypertension   . Impaired fasting glucose   . Ischemic cardiomyopathy    a. 02/2015 Echo: EF 15-20%; b. 05/2015 Echo: EF 35-40%; c. 06/2018 Echo: EF 40-45%.  [name] PAF (paroxysmal atrial fibrillation) ([place])    a. Rate controlled. CHA2DS2VASc = 7--> Eliquis.  . Stroke [facility]) ~ 1995-02/2009 X 2   a. 02/2008 MRI brain: prior infarct involving R lbe of cerebellum w/ encephalomalcic change.  . Urgency of urination     Past Surgical History:  Procedure Laterality Date  . CARDIAC CATHETERIZATION  ~ 1995   Archie Endo 08/22/2010  . CARDIAC CATHETERIZATION N/A 03/02/2015   Procedure: Left Heart Cath and Coronary Angiography;  Surgeon: Jettie Booze, MD;  Location: [place] CV LAB;  Service: Cardiovascular;  Laterality: N/A;  . CARDIAC CATHETERIZATION  03/02/2015   Procedure: Coronary Stent Intervention;  Surgeon: Jettie Booze, MD;  Location: [place] CV LAB;  Service: Cardiovascular;;  . COLONOSCOPY W/ POLYPECTOMY    . ESOPHAGOGASTRODUODENOSCOPY (EGD) WITH PROPOFOL N/A 01/31/2017   Procedure: ESOPHAGOGASTRODUODENOSCOPY (EGD) WITH PROPOFOL;  Surgeon: Lucilla Lame, MD;  [facility] ENDOSCOPY;  Service: Endoscopy;  Laterality: N/A;  .  EYE SURGERY Right   . HERNIA REPAIR     hx UHR/notes 08/22/2010  . INGUINAL HERNIA REPAIR Left    Archie Endo 08/22/2010  . LUMBAR LAMINECTOMY/DECOMPRESSION MICRODISCECTOMY  01/24/2012   Procedure: LUMBAR LAMINECTOMY/DECOMPRESSION MICRODISCECTOMY 1 LEVEL;  Surgeon: Winfield Cunas, MD;  Location: [place] NEURO ORS;  Service: Neurosurgery;  Laterality: Right;  RIGHT Lumbar Three-Four far lateral diskectomy  . [place]   Archie Endo 08/22/2010  . RETINAL DETACHMENT REPAIR W/ SCLERAL BUCKLE LE Right 08/2006   Archie Endo 08/22/2010  . TONSILLECTOMY    . UMBILICAL HERNIA REPAIR     hx/notes 08/22/2010    Prior to Admission medications   Medication Sig Start Date End Date Taking? Authorizing Provider  albuterol  (PROVENTIL HFA;VENTOLIN HFA) 108 (90 Base) MCG/ACT inhaler Inhale 2 puffs into the lungs every 6 (six) hours as needed for wheezing or shortness of breath. 02/01/17   Hillary Bow, MD  atorvastatin (LIPITOR) 80 MG tablet TAKE ONE TABLET EVERY DAY AT [facility] 10/08/18   Minna Merritts, MD  carvedilol (COREG) 3.125 MG tablet TAKE ONE TABLET TWICE DAILY 02/23/19   Minus Breeding, MD  colchicine (COLCRYS) 0.6 MG tablet TAKE 1 TABLET BY MOUTH TWICE DAILY AS NEEDED FOR JOINT SWELLING 06/30/18   Kyle Carbon, MD  ELIQUIS 5 MG TABS tablet TAKE ONE TABLET BY MOUTH TWICE DAILY 08/09/19   Minna Merritts, MD  furosemide (LASIX) 20 MG tablet TAKE 1 TABLET BY MOUTH DAILY 07/27/19   Minus Breeding, MD  losartan (COZAAR) 50 MG tablet TAKE ONE TABLET EVERY DAY 10/08/18   Minna Merritts, MD  pantoprazole (PROTONIX) 40 MG tablet Take 1 tablet (40 mg total) by mouth 2 (two) times daily before a meal. 06/30/18   Minus Breeding, MD  traMADol (ULTRAM) 50 MG tablet TAKE 1 TABLET BY MOUTH 3 TIMES DAILY AS NEEDED 08/09/19   Kyle Carbon, MD    Family History  Problem Relation Age of Onset  . Heart disease Mother   . Coronary artery disease Mother   . Heart disease Father   . Coronary artery disease Father   . Heart disease Brother   . Heart disease Brother   . Cancer Brother        prostate  . Diabetes Neg Hx      Social History   Tobacco Use  . Smoking status: Never Smoker  . Smokeless tobacco: Former Systems developer    Types: Chew  Substance Use Topics  . Alcohol use: No  . Drug use: No    Allergies as of 08/23/2019 - Review Complete 07/02/2019  Allergen Reaction Noted  . Simvastatin      Review of Systems:    All systems reviewed and negative except where noted in HPI.   Physical Exam:  There were no vitals taken for this visit. No LMP for male patient. Psych:  Alert and cooperative. Normal mood and affect. General:   Alert,  Well-developed, well-nourished, pleasant and cooperative in  NAD Head:  Normocephalic and atraumatic. Eyes:  Sclera clear, no icterus.   Conjunctiva pink. Ears:  Normal auditory acuity. Neck:  Supple; no masses or thyromegaly. Lungs:  Respirations even and unlabored.  Clear throughout to auscultation.   No wheezes, crackles, or rhonchi. No acute distress. Heart:  Regular rate and rhythm; systolic murmur in the aortic area., clicks, rubs, or gallops. Abdomen:  Normal bowel sounds.  No bruits.  Soft, non-tender and non-distended without masses, hepatosplenomegaly or hernias noted.  No guarding  or rebound tenderness.    Neurologic:  Alert and oriented x3;  Psych:  Alert and cooperative. Normal mood and affect.  Imaging Studies: No results found.  Assessment and Plan:   Kyle [name] is a 86 y.o. y/o male has been referred for diagnosis of microcytic anemia along with B12 deficiency.  Stool fit test has been positive.  EGD in 2018 and admitted for GI bleed demonstrated gastric ulcer suggestive of NSAID use.  At that time he also had LA grade D esophagitis.  Anemia is new in onset.  Recent history of epistaxis which may have caused a drop in the hemoglobin.   Plan 1.  Iron studies, celiac serology, urine analysis. 2.  Increase B12 supplementation from 500 mcg `1000 mcg orally daily.  He says that he is due for intramuscular shot next week with Dr. Silvio Pate.  I informed him that if that is the case then he can hold off on the oral B12 supplement but if he is not having a shot then should increase it to 1000 mcg daily  3.  EGD plus colonoscopy to evaluate for anemia.  Will need Eliquis holding instructions.  If negative will need capsule study of the small bowel.  Cardiac clearance will be requested  I have discussed alternative options, risks & benefits,  which include, but are not limited to, bleeding, infection, perforation,respiratory complication & drug reaction.  The patient agrees with this plan & written consent will be obtained.     Follow up in 8  weeks  Dr Kyle Bellows MD,MRCP(U.K)

## 2019-08-23 NOTE — Telephone Encounter (Signed)
   [facility] Pre-operative Risk Assessment    Request for surgical clearance:  1. What type of surgery is being performed? Upper and lower Endoscopy   2. When is this surgery scheduled? 09-13-19   3. What type of clearance is required (medical clearance vs. Pharmacy clearance to hold med vs. Both)?  Both  4. Are there any medications that need to be held prior to surgery and how long? Eliquis 5 mg, 2 day hold   5. Practice name and name of physician performing surgery?  [facility],  Dr. Jonathon Bellows   6. What is your office phone number [phone]    7.   What is your office fax number [phone]  8.   Anesthesia type (None, local, MAC, general) ? General   Kyle [name] 08/23/2019, 3:39 PM  _________________________________________________________________   (provider comments below)

## 2019-08-24 LAB — H. PYLORI BREATH TEST: H pylori Breath Test: NEGATIVE

## 2019-08-24 NOTE — Telephone Encounter (Signed)
Pharmacy, recommendations for holding Eliquis in this patient. Hx of Afib. Thanks. Please route response to P CV DIV PREOP.

## 2019-08-24 NOTE — Telephone Encounter (Signed)
Patient with diagnosis of ATRIAL FIBRILLATION on ELIQUIS for anticoagulation.    Procedure: UPPER AND LOWER ENDOSCOPY Date of procedure: 09-13-2019  CHADS2-VASc score of 7  (CHF, HTN, AGEx2, stroke/tia x 2, CAD)  CrCl = 44 ML.MIN Platelet count 309  Per office protocol, patient can hold ELIQUIS for 2 days prior to procedure.    * WILL request Cardiologist input d/t prior history of stroke*

## 2019-08-25 LAB — CELIAC DISEASE AB SCREEN W/RFX
Antigliadin Abs, IgA: 4 units (ref 0–19)
IgA/Immunoglobulin A, Serum: 262 mg/dL (ref 61–437)
Transglutaminase IgA: <2 U/mL (ref 0–3)

## 2019-08-25 LAB — URINALYSIS
Bilirubin, UA: NEGATIVE
Glucose, UA: NEGATIVE
Ketones, UA: NEGATIVE
Leukocytes,UA: NEGATIVE
Nitrite, UA: NEGATIVE
RBC, UA: NEGATIVE
Specific Gravity, UA: 1.019 (ref 1.005–1.030)
Urobilinogen, Ur: 0.2 mg/dL (ref 0.2–1.0)
pH, UA: 5.5 (ref 5.0–7.5)

## 2019-08-25 LAB — IRON,TIBC AND FERRITIN PANEL
Ferritin: 85 ng/mL (ref 30–400)
Iron Saturation: 8 % — CL (ref 15–55)
Iron: 25 ug/dL — ABNORMAL LOW (ref 38–169)
Total Iron Binding Capacity: 314 ug/dL (ref 250–450)
UIBC: 289 ug/dL (ref 111–343)

## 2019-08-26 ENCOUNTER — Other Ambulatory Visit: Payer: Self-pay

## 2019-08-26 ENCOUNTER — Other Ambulatory Visit (INDEPENDENT_AMBULATORY_CARE_PROVIDER_SITE_OTHER): Payer: Medicare Other

## 2019-08-26 DIAGNOSIS — E538 Deficiency of other specified B group vitamins: Secondary | ICD-10-CM

## 2019-08-26 LAB — CBC
HCT: 26.8 % — ABNORMAL LOW (ref 39.0–52.0)
Hemoglobin: 8.1 g/dL — ABNORMAL LOW (ref 13.0–17.0)
MCHC: 30.3 g/dL (ref 30.0–36.0)
MCV: 76.4 fl — ABNORMAL LOW (ref 78.0–100.0)
Platelets: 299 10*3/uL (ref 150.0–400.0)
RBC: 3.51 Mil/uL — ABNORMAL LOW (ref 4.22–5.81)
RDW: 19.9 % — ABNORMAL HIGH (ref 11.5–15.5)
WBC: 6.8 10*3/uL (ref 4.0–10.5)

## 2019-08-26 LAB — VITAMIN B12: Vitamin B-12: >1500 pg/mL — ABNORMAL HIGH (ref 211–911)

## 2019-08-28 NOTE — Telephone Encounter (Signed)
Based on notes from [name] last month would be acceptable risk no further testing needed Okay to hold Eliquis 2 days restart following procedure

## 2019-08-30 ENCOUNTER — Encounter: Payer: Self-pay | Admitting: Gastroenterology

## 2019-08-30 NOTE — Telephone Encounter (Addendum)
    Primary Cardiologist:Kyle Rockey Situ, MD  Chart reviewed as part of pre-operative protocol coverage. Because of Kyle [name] past medical history and time since last visit, he/she will require a follow-up visit in order to better assess preoperative cardiovascular risk.  Kyle [name] was last seen on 09/22/18 by Dr. Rockey [name]. Has h/o CAD with prior non-STEMI and LAD stenting in 2016, ischemic cardiomyopathy with an EF of 40 to 45%, HFrEF, paroxysmal atrial fibrillation on Eliquis, hypertension, hyperlipidemia, prior cerebellar stroke, and prostate cancer.  Revised cardiac risk index is calculated at >11%. Patient's last in-person OV was in 06/2018. His last tele-med visit was in June 2020 with recommendation to follow up in December 2020 but I do not see this occurred.  (Although mentioned below, I cannot find any notes from Kyle [name] last month - he last saw in 07/2018 via telemedicine.)   Since it has been over 1 year for physical assessment, he has elevated cardiac risk index, ongoing microcytic anemia, and there is time before procedure, I would suggest an in-person OV ASAP to discuss not only clearance but long-term Eliquis plan. Both Kyle [name] and Kyle [name] appear to have several openings early this week.  Pre-op covering staff: - Please schedule appointment and call patient to inform them. - Please contact requesting surgeon's office via preferred method (i.e, phone, fax) to inform them of need for appointment prior to surgery.   Kyle Pitter, PA-C  08/30/2019, 10:28 AM

## 2019-08-30 NOTE — Telephone Encounter (Signed)
Pt is scheduled to see Laurann Montana, NP on 09/01/19. Clearance will be addressed at visit

## 2019-08-31 NOTE — Progress Notes (Signed)
Office Visit    Patient Name: Kyle [name] Date of Encounter: 09/01/2019  Primary Care Provider:  Venia Carbon, MD Primary Cardiologist:  Ida Rogue, MD Electrophysiologist:  None   Chief Complaint    Kyle [name] is a 86 y.o. male with a hx of CAD with prior NSTEMI and LAD stenting in 2016, ischemic cardiomyopathy with LVEF 40-45%, HFrEF, paroxysmal atrial fibrillation on Eliquis, HTN, HLD, prior cerebellar stroke, prostate cancer presents today for cardiac clearance for colonoscopy.  Past Medical History    Past Medical History:  Diagnosis Date  . Arthritis   . CAD (coronary artery disease)    a. 02/2015 NSTEMI/PCI: LM nl, LAd 28m (2.5x16 Synergy DES), D1 50ost, D2 50ost, RI 25ost, LCX 25ost/p, 75d (small), LPDA 90 (small), RCA 80p (small).  . Cancer [facility])    prostate CA  . Claustrophobia    "EXTREMELY" (03/02/2015)  . Detached retina    right  . GERD (gastroesophageal reflux disease)   . H/O dizziness   . HFrEF (heart failure with reduced ejection fraction) ([place])    a. 02/2015 Echo: EF 15-20%; b. 05/2015 Echo: EF 35-40%; c. 06/2018 Echo: EF 40-45%, DD, Nl RV fxn. RVSP 37.65mmHg. Mild LAE. Mild AS/AI.  [name] History of prostate cancer   . Hyperlipidemia   . Hypertension   . Impaired fasting glucose   . Ischemic cardiomyopathy    a. 02/2015 Echo: EF 15-20%; b. 05/2015 Echo: EF 35-40%; c. 06/2018 Echo: EF 40-45%.  [name] PAF (paroxysmal atrial fibrillation) ([place])    a. Rate controlled. CHA2DS2VASc = 7--> Eliquis.  . Stroke [facility]) ~ 1995-02/2009 X 2   a. 02/2008 MRI brain: prior infarct involving R lbe of cerebellum w/ encephalomalcic change.  . Urgency of urination    Past Surgical History:  Procedure Laterality Date  . CARDIAC CATHETERIZATION  ~ 1995   Archie Endo 08/22/2010  . CARDIAC CATHETERIZATION N/A 03/02/2015   Procedure: Left Heart Cath and Coronary Angiography;  Surgeon: Jettie Booze, MD;  Location: [place] CV LAB;  Service: Cardiovascular;  Laterality:  N/A;  . CARDIAC CATHETERIZATION  03/02/2015   Procedure: Coronary Stent Intervention;  Surgeon: Jettie Booze, MD;  Location: [place] CV LAB;  Service: Cardiovascular;;  . COLONOSCOPY W/ POLYPECTOMY    . ESOPHAGOGASTRODUODENOSCOPY (EGD) WITH PROPOFOL N/A 01/31/2017   Procedure: ESOPHAGOGASTRODUODENOSCOPY (EGD) WITH PROPOFOL;  Surgeon: Lucilla Lame, MD;  Location: [facility] ENDOSCOPY;  Service: Endoscopy;  Laterality: N/A;  . EYE SURGERY Right   . HERNIA REPAIR     hx UHR/notes 08/22/2010  . INGUINAL HERNIA REPAIR Left    Archie Endo 08/22/2010  . LUMBAR LAMINECTOMY/DECOMPRESSION MICRODISCECTOMY  01/24/2012   Procedure: LUMBAR LAMINECTOMY/DECOMPRESSION MICRODISCECTOMY 1 LEVEL;  Surgeon: Winfield Cunas, MD;  Location: [place] NEURO ORS;  Service: Neurosurgery;  Laterality: Right;  RIGHT Lumbar Three-Four far lateral diskectomy  . [place]   Archie Endo 08/22/2010  . RETINAL DETACHMENT REPAIR W/ SCLERAL BUCKLE LE Right 08/2006   Archie Endo 08/22/2010  . TONSILLECTOMY    . UMBILICAL HERNIA REPAIR     hx/notes 08/22/2010    Allergies  Allergies  Allergen Reactions  . Simvastatin     REACTION: myalgias    History of Present Illness    Kyle [name] is a 86 y.o. male with a hx of CAD with prior NSTEMI and LAD stenting in 2016, ischemic cardiomyopathy with LVEF 40-45%, HFrEF, paroxysmal atrial fibrillation on Eliquis, HTN, HLD, prior cerebellar stroke, prostate cancer.  He was  last seen 09/2018 via telemedicine by Dr. Rockey Situ.  His CAD is s/p NSTEMI in the setting of rapid atrial fibrillation 2016.  LV dysfunction with an EF of 15 to 20% and catheterization revealing severe multivessel disease.  Culprit felt to be LAD and this was successfully treated with DES.  He has residual small vessel circumflex, L PDA, RCA disease.  LVEF subsequently improved with echo 06/2018 with LVEF 40-45%, mildly elevated RV systolic pressure 123456, LA mildly dilated, mild AI and mild AS, dilation of aortic root and  ascending aorta.   He has upcoming upper and lower endoscopy scheduled for 09/13/2019.  Request for both medical and pharmaceutical clearance in the setting of Eliquis were seen.  As it had been greater than 1 year since he was seen in the office he was scheduled for evaluation.  Presents today for clearance for colonoscopy.  His son is present with him.  Had a couple weeks of significant nosebleeds the end of February or early March.  Treated with Afrin by EMS with no recurrent symptoms.  We discussed using a saline nasal spray or humidifier for prevention.  Reports no hematuria, melena.  Monitors blood pressure and heart rate at home.  Blood pressure routinely 120s over 70s.  Heart rate routinely 80s.   Reports no chest pain, pressure, or tightness. No orthopnea, PND. Reports no palpitations.  Reports intermittent lower extremity edema that is overall well controlled on his present dose of Lasix. He reports lightheadedness only with quick position changes.  We discussed orthostatic hypotension and precautionary measures including slow position changes, compression stockings.  He reports no shortness of breath at rest.  Does endorse some dyspnea on exertion over the last 6 months.  He attributes this to his anemia and deconditioning.  07/02/2019 he had hemoglobin of 8.8 and repeat 08/26/2019 with reading of 8.1.  His hemoglobin was previously stable at 11.  He was most recently seen by GI 08/22/2019 with positive stool fit test.  EKGs/Labs/Other Studies Reviewed:   The following studies were reviewed today:  Echo 06/2018  1. The left ventricle has mild-moderately reduced systolic function, with an ejection fraction of 40-45%. The cavity size was normal. There is mildly increased left ventricular wall thickness. Left ventricular diastolic Doppler parameters are consistent  with pseudonormalization. Unable to exclude inferior wall hypokinesis.  2. The right ventricle has normal systolic function. The  cavity was normal. There is no increase in right ventricular wall thickness. Right ventricular systolic pressure is mildly elevated with an estimated pressure of 37.9 mmHg.  3. Left atrial size was mildly dilated.  4. Moderate calcification of the aortic valve. Aortic valve regurgitation is mild by color flow Doppler. mild stenosis of the aortic valve.  5. There is dilatation of the aortic root and of the ascending aorta.  EKG:  EKG is ordered today.  The ekg ordered today demonstrates atrial fibrillation rate controlled at 82 bpm with left axis deviation and LVH with QRS widening-no acute ST/T wave changes.  Recent Labs: 07/02/2019: ALT 6; BUN 14; Creatinine, Ser 1.34; Potassium 3.5; Sodium 138 08/26/2019: Hemoglobin 8.1 Repeated and verified X2.; Platelets 299.0  Recent Lipid Panel    Component Value Date/Time   CHOL 92 07/02/2019 0913   CHOL 134 10/25/2016 1156   TRIG 58.0 07/02/2019 0913   TRIG 119 05/01/2006 1043   HDL 39.70 07/02/2019 0913   HDL 66 10/25/2016 1156   CHOLHDL 2 07/02/2019 0913   VLDL 11.6 07/02/2019 0913   LDLCALC 41  07/02/2019 0913   LDLCALC 56 10/25/2016 1156   LDLDIRECT 164.4 10/10/2010 1012    Home Medications   Current Meds  Medication Sig  . albuterol (PROVENTIL HFA;VENTOLIN HFA) 108 (90 Base) MCG/ACT inhaler Inhale 2 puffs into the lungs every 6 (six) hours as needed for wheezing or shortness of breath.  [name] atorvastatin (LIPITOR) 80 MG tablet TAKE ONE TABLET EVERY DAY AT 6PM  . carvedilol (COREG) 3.125 MG tablet TAKE ONE TABLET TWICE DAILY  . ELIQUIS 5 MG TABS tablet TAKE ONE TABLET BY MOUTH TWICE DAILY  . furosemide (LASIX) 20 MG tablet TAKE 1 TABLET BY MOUTH DAILY  . losartan (COZAAR) 50 MG tablet TAKE ONE TABLET EVERY DAY  . pantoprazole (PROTONIX) 40 MG tablet Take 1 tablet (40 mg total) by mouth 2 (two) times daily before a meal.  . traMADol (ULTRAM) 50 MG tablet TAKE 1 TABLET BY MOUTH 3 TIMES DAILY AS NEEDED  . vitamin B-12 (CYANOCOBALAMIN) 1000 MCG  tablet Take 1 tablet (1,000 mcg total) by mouth daily.    Review of Systems      Review of Systems  Constitution: Positive for malaise/fatigue. Negative for chills and fever.  Cardiovascular: Positive for dyspnea on exertion. Negative for chest pain, irregular heartbeat, leg swelling, near-syncope, orthopnea, palpitations and syncope.  Respiratory: Negative for cough, shortness of breath and wheezing.   Gastrointestinal: Negative for melena, nausea and vomiting.  Genitourinary: Negative for hematuria.  Neurological: Negative for dizziness, light-headedness and weakness.   All other systems reviewed and are otherwise negative except as noted above.  Physical Exam    VS:  BP 130/80 (BP Location: Left Arm, Patient Position: Sitting, Cuff Size: Normal)   Pulse 82   Ht 6' (1.829 m)   Wt 172 lb (78 kg)   SpO2 98%   BMI 23.33 kg/m  , BMI Body mass index is 23.33 kg/m. GEN: Thin, pale, well developed, in no acute distress. HEENT: normal. Neck: Supple, no JVD, carotid bruits, or masses. Cardiac: irregularly irregular, no  rubs, or gallops. Gr 2/6 systolic murmur. No clubbing, cyanosis, edema.  Radials//PT 2+ and equal bilaterally.  Respiratory:  Respirations regular and unlabored, clear to auscultation bilaterally. GI: Soft, nontender, nondistended, BS + x 4. MS: No deformity or atrophy. Skin: Warm and dry, no rash. Neuro:  Strength and sensation are intact. Psych: Normal affect.  Accessory Clinical Findings    ECG personally reviewed by me today -rate controlled atrial fibrillation 82 bpm with left axis deviation and LVH with QRS widening- no acute changes.  Assessment & Plan    1. Preoperative cardiovascular exam - Upcoming endoscopy by GI for microcytic anemia. Hx notable for persistent atrial fib, CHF, CVA, mild aortic stenosis. Request received for medical and pharmaceutical clearance. According to the Revised Cardiac Risk Index (RCRI), his Perioperative Risk of Major Cardiac  Event is (%): 11. His Functional Capacity in METs is: 4.64 according to the Duke Activity Status Index (DASI). He does note DOE but this is likely due to his anemia. His CAD with without chest pain, pressure, tightness. EKG today shows rate controlled atrial fib with LVH. No indication for ischemic evaluation at this time. He is deemed acceptable risk to proceed with endoscopy at this time without further cardiovascular testing. Per Dr. Rockey Situ, he may hold his Eliquis 2 days prior to the procedure. Instructed to discuss with GI when to resume after procedure, recommend prompt resumption. We discussed getting an updated echocardiogram for monitoring of his mild aortic stenosis but as  his murmur is stable on exam this may be done after his endoscopy. Recommend careful utilization of IVF during procedure due to HFrEF EF 40-45% to prevent volume overload. Note forwarded to office requesting clearance via Epic Fax function.   2. CAD -EKG today with no acute ST/T wave changes.  Reports no chest pain, pressure, tightness.  No indication for ischemic evaluation at this time.  Continue GDMT including beta-blocker, statin.  No aspirin secondary to chronic anticoagulation.  3. Persistent atrial fibrillation -EKG today rate controlled atrial fibrillation 82 bpm.  He is unaware of atrial fibrillation reports no palpitations, irregular heartbeats.  Last EKG done greater than 1 year ago 05/25/2018 showed rate controlled atrial fibrillation as well.  Anticipate we are moving more towards permanent atrial fibrillation.  Continue Coreg 3.125 mg twice daily for rate control and Eliquis 5 mg twice daily for anticoagulation.  4. Chronic anticoagulation -CHA2DS2-VASc of at least 7 (agex2, HTN, CVAx2, HF, CAD).  Reports episode of nosebleeds a couple months ago that was resolved with Afrin given by EMS.  Reports no melena, hematuria.  Upcoming evaluation for anemia by GI with endoscopy, as above.  Continue Eliquis 5 mg twice daily.   He does not need dose reduction criteria.  He may hold Eliquis 2 days prior to his procedure per Dr. Donivan Scull recommendation.  5. Hx of CVA - No recurrent symptoms. Continue statin. No aspirin secondary to chronic anticoagulation.   6. ICM/Chronic systolic heart failure -Echo 06/2018 with LVEF 40-45%, mild aortic stenosis, well compensated on exam today.  No signs of volume overload.  NYHA II symptoms with dyspnea on exertion.  Likely exacerbated by his present anemia.  GDMT includes ARB, beta-blocker, loop diuretic.  Plan to reevaluate heart failure symptoms after resolution of anemia.  Hesitant to add MRA at this time due to low volume status in setting of anemia. Consider addition at follow up.   7. HTN -BP well controlled.  Continue present antihypertensive regimen including Coreg 3.125 twice daily, losartan 50 mg daily.  8. HLD - Lipid panel 07/02/19 with LDL 41. Normal liver function 07/02/19.  Continue atorvastatin 80 mg daily.  9. Mild Aortic Stenosis - By echo 06/2018.  Stable murmur on exam.  Reports no chest pain, near syncope.  Does endorse dyspnea on exertion but no shortness of breath at rest.  Anticipate this is mostly due to his anemia, as above.  Recommended repeat echocardiogram for monitoring which can be completed after his upcoming endoscopy.  Disposition: Follow up in 6 month(s) with Dr. Arta Bruce or APP  [name], NP 09/01/2019, 10:22 AM

## 2019-09-01 ENCOUNTER — Encounter: Payer: Self-pay | Admitting: Family

## 2019-09-01 ENCOUNTER — Ambulatory Visit (INDEPENDENT_AMBULATORY_CARE_PROVIDER_SITE_OTHER): Payer: Medicare Other | Admitting: Family

## 2019-09-01 ENCOUNTER — Other Ambulatory Visit: Payer: Self-pay

## 2019-09-01 VITALS — BP 130/80 | HR 82 | Ht 72.0 in | Wt 172.0 lb

## 2019-09-01 DIAGNOSIS — E782 Mixed hyperlipidemia: Secondary | ICD-10-CM

## 2019-09-01 DIAGNOSIS — I480 Paroxysmal atrial fibrillation: Secondary | ICD-10-CM

## 2019-09-01 DIAGNOSIS — I25118 Atherosclerotic heart disease of native coronary artery with other forms of angina pectoris: Secondary | ICD-10-CM | POA: Diagnosis not present

## 2019-09-01 DIAGNOSIS — Z7901 Long term (current) use of anticoagulants: Secondary | ICD-10-CM | POA: Diagnosis not present

## 2019-09-01 DIAGNOSIS — I10 Essential (primary) hypertension: Secondary | ICD-10-CM | POA: Diagnosis not present

## 2019-09-01 DIAGNOSIS — I350 Nonrheumatic aortic (valve) stenosis: Secondary | ICD-10-CM | POA: Diagnosis not present

## 2019-09-01 DIAGNOSIS — I255 Ischemic cardiomyopathy: Secondary | ICD-10-CM

## 2019-09-01 NOTE — Patient Instructions (Addendum)
Medication Instructions:  No medication changes today.   *If you need a refill on your cardiac medications before your next appointment, please call your pharmacy*  You may hold Eliquis 2 days prior to your Colonoscopy..you will restart it according to the Physicians recommendations that does your procedure.   Lab Work: No lab work today.   Testing/Procedures: Your EKG today shows rate controlled atrial fibrillation.   Your physician has requested that you have an echocardiogram. Echocardiography is a painless test that uses sound waves to create images of your heart. It provides your doctor with information about the size and shape of your heart and how well your heart's chambers and valves are working. This procedure takes approximately one hour. There are no restrictions for this procedure. This will monitor your heart's pumping function and aortic valve which was mildly on last echocardiogram.   Follow-Up: At [facility], you and your health needs are our priority.  As part of our continuing mission to provide you with exceptional heart care, we have created designated Provider Care Teams.  These Care Teams include your primary Cardiologist (physician) and Advanced Practice Providers (APPs -  Physician Assistants and Nurse Practitioners) who all work together to provide you with the care you need, when you need it.  We recommend signing up for the patient portal called "MyChart".  Sign up information is provided on this After Visit Summary.  MyChart is used to connect with patients for Virtual Visits (Telemedicine).  Patients are able to view lab/test results, encounter notes, upcoming appointments, etc.  Non-urgent messages can be sent to your provider as well.   To learn more about what you can do with MyChart, go to [email or web address].    Your next appointment:   6 month(s)  The format for your next appointment:   In Person  Provider:    You may see Ida Rogue, MD or  one of the following Advanced Practice Providers on your designated Care Team:    Murray Hodgkins, NP  Christell Faith, PA-C  Marrianne Mood, PA-C

## 2019-09-06 ENCOUNTER — Other Ambulatory Visit: Payer: Self-pay | Admitting: Internal Medicine

## 2019-09-06 NOTE — Telephone Encounter (Signed)
Last filled 08-09-19 390 Last OV 07-02-19 Next OV 07-04-20 [facility]

## 2019-09-09 ENCOUNTER — Other Ambulatory Visit
Admission: RE | Admit: 2019-09-09 | Discharge: 2019-09-09 | Disposition: A | Discharge status: Home / Self Care | Payer: Medicare Other | Source: Ambulatory Visit | Attending: Gastroenterology | Admitting: Gastroenterology

## 2019-09-09 DIAGNOSIS — Z20822 Contact with and (suspected) exposure to covid-19: Secondary | ICD-10-CM | POA: Insufficient documentation

## 2019-09-09 DIAGNOSIS — Z01812 Encounter for preprocedural laboratory examination: Secondary | ICD-10-CM | POA: Insufficient documentation

## 2019-09-09 LAB — SARS CORONAVIRUS 2 (TAT 6-24 HRS): SARS Coronavirus 2: NEGATIVE

## 2019-09-13 ENCOUNTER — Ambulatory Visit: Payer: Medicare Other | Admitting: Registered Nurse

## 2019-09-13 ENCOUNTER — Encounter
Admission: RE | Disposition: A | Discharge status: Home / Self Care | Payer: Self-pay | Source: Home / Self Care | Attending: Gastroenterology

## 2019-09-13 ENCOUNTER — Ambulatory Visit
Admission: RE | Admit: 2019-09-13 | Discharge: 2019-09-13 | Disposition: A | Discharge status: Home / Self Care | Payer: Medicare Other | Attending: Gastroenterology | Admitting: Gastroenterology

## 2019-09-13 ENCOUNTER — Encounter: Payer: Self-pay | Admitting: Gastroenterology

## 2019-09-13 DIAGNOSIS — I5022 Chronic systolic (congestive) heart failure: Secondary | ICD-10-CM | POA: Diagnosis not present

## 2019-09-13 DIAGNOSIS — D122 Benign neoplasm of ascending colon: Secondary | ICD-10-CM | POA: Insufficient documentation

## 2019-09-13 DIAGNOSIS — Z888 Allergy status to other drugs, medicaments and biological substances status: Secondary | ICD-10-CM | POA: Insufficient documentation

## 2019-09-13 DIAGNOSIS — K6389 Other specified diseases of intestine: Secondary | ICD-10-CM | POA: Diagnosis not present

## 2019-09-13 DIAGNOSIS — K317 Polyp of stomach and duodenum: Secondary | ICD-10-CM | POA: Diagnosis not present

## 2019-09-13 DIAGNOSIS — D124 Benign neoplasm of descending colon: Secondary | ICD-10-CM | POA: Diagnosis not present

## 2019-09-13 DIAGNOSIS — M1990 Unspecified osteoarthritis, unspecified site: Secondary | ICD-10-CM | POA: Diagnosis not present

## 2019-09-13 DIAGNOSIS — D123 Benign neoplasm of transverse colon: Secondary | ICD-10-CM | POA: Insufficient documentation

## 2019-09-13 DIAGNOSIS — I110 Hypertensive heart disease with heart failure: Secondary | ICD-10-CM | POA: Insufficient documentation

## 2019-09-13 DIAGNOSIS — K219 Gastro-esophageal reflux disease without esophagitis: Secondary | ICD-10-CM | POA: Diagnosis not present

## 2019-09-13 DIAGNOSIS — K5289 Other specified noninfective gastroenteritis and colitis: Secondary | ICD-10-CM | POA: Diagnosis not present

## 2019-09-13 DIAGNOSIS — D126 Benign neoplasm of colon, unspecified: Secondary | ICD-10-CM

## 2019-09-13 DIAGNOSIS — I351 Nonrheumatic aortic (valve) insufficiency: Secondary | ICD-10-CM | POA: Diagnosis not present

## 2019-09-13 DIAGNOSIS — I480 Paroxysmal atrial fibrillation: Secondary | ICD-10-CM | POA: Diagnosis not present

## 2019-09-13 DIAGNOSIS — I255 Ischemic cardiomyopathy: Secondary | ICD-10-CM | POA: Diagnosis not present

## 2019-09-13 DIAGNOSIS — I25118 Atherosclerotic heart disease of native coronary artery with other forms of angina pectoris: Secondary | ICD-10-CM | POA: Diagnosis not present

## 2019-09-13 DIAGNOSIS — Z8042 Family history of malignant neoplasm of prostate: Secondary | ICD-10-CM | POA: Insufficient documentation

## 2019-09-13 DIAGNOSIS — D131 Benign neoplasm of stomach: Secondary | ICD-10-CM | POA: Diagnosis not present

## 2019-09-13 DIAGNOSIS — Z955 Presence of coronary angioplasty implant and graft: Secondary | ICD-10-CM | POA: Diagnosis not present

## 2019-09-13 DIAGNOSIS — R3915 Urgency of urination: Secondary | ICD-10-CM | POA: Insufficient documentation

## 2019-09-13 DIAGNOSIS — I509 Heart failure, unspecified: Secondary | ICD-10-CM | POA: Insufficient documentation

## 2019-09-13 DIAGNOSIS — E7800 Pure hypercholesterolemia, unspecified: Secondary | ICD-10-CM | POA: Diagnosis not present

## 2019-09-13 DIAGNOSIS — I252 Old myocardial infarction: Secondary | ICD-10-CM | POA: Insufficient documentation

## 2019-09-13 DIAGNOSIS — Z8546 Personal history of malignant neoplasm of prostate: Secondary | ICD-10-CM | POA: Insufficient documentation

## 2019-09-13 DIAGNOSIS — E785 Hyperlipidemia, unspecified: Secondary | ICD-10-CM | POA: Diagnosis not present

## 2019-09-13 DIAGNOSIS — F419 Anxiety disorder, unspecified: Secondary | ICD-10-CM | POA: Insufficient documentation

## 2019-09-13 DIAGNOSIS — Z8249 Family history of ischemic heart disease and other diseases of the circulatory system: Secondary | ICD-10-CM | POA: Insufficient documentation

## 2019-09-13 DIAGNOSIS — K279 Peptic ulcer, site unspecified, unspecified as acute or chronic, without hemorrhage or perforation: Secondary | ICD-10-CM | POA: Diagnosis not present

## 2019-09-13 DIAGNOSIS — Z7901 Long term (current) use of anticoagulants: Secondary | ICD-10-CM | POA: Insufficient documentation

## 2019-09-13 DIAGNOSIS — I2510 Atherosclerotic heart disease of native coronary artery without angina pectoris: Secondary | ICD-10-CM | POA: Insufficient documentation

## 2019-09-13 DIAGNOSIS — Z79899 Other long term (current) drug therapy: Secondary | ICD-10-CM | POA: Insufficient documentation

## 2019-09-13 DIAGNOSIS — D509 Iron deficiency anemia, unspecified: Secondary | ICD-10-CM | POA: Diagnosis not present

## 2019-09-13 DIAGNOSIS — D125 Benign neoplasm of sigmoid colon: Secondary | ICD-10-CM | POA: Diagnosis not present

## 2019-09-13 DIAGNOSIS — Z8673 Personal history of transient ischemic attack (TIA), and cerebral infarction without residual deficits: Secondary | ICD-10-CM | POA: Diagnosis not present

## 2019-09-13 DIAGNOSIS — K635 Polyp of colon: Secondary | ICD-10-CM | POA: Diagnosis not present

## 2019-09-13 HISTORY — PX: COLONOSCOPY WITH PROPOFOL: SHX5780

## 2019-09-13 HISTORY — PX: ESOPHAGOGASTRODUODENOSCOPY (EGD) WITH PROPOFOL: SHX5813

## 2019-09-13 SURGERY — COLONOSCOPY WITH PROPOFOL
Anesthesia: General

## 2019-09-13 MED ORDER — LIDOCAINE HCL (CARDIAC) PF 100 MG/5ML IV SOSY
PREFILLED_SYRINGE | INTRAVENOUS | Status: DC | PRN
  Administered 2019-09-13: 80 mg via INTRAVENOUS

## 2019-09-13 MED ORDER — PROPOFOL 500 MG/50ML IV EMUL
INTRAVENOUS | Status: DC | PRN
  Administered 2019-09-13: 150 ug/kg/min via INTRAVENOUS

## 2019-09-13 MED ORDER — GLYCOPYRROLATE 0.2 MG/ML IJ SOLN
INTRAMUSCULAR | Status: AC
  Filled 2019-09-13: qty 1

## 2019-09-13 MED ORDER — DEXMEDETOMIDINE HCL 200 MCG/2ML IV SOLN
INTRAVENOUS | Status: DC | PRN
  Administered 2019-09-13: 20 ug via INTRAVENOUS

## 2019-09-13 MED ORDER — PHENYLEPHRINE HCL (PRESSORS) 10 MG/ML IV SOLN
INTRAVENOUS | Status: DC | PRN
  Administered 2019-09-13 (×4): 100 ug via INTRAVENOUS

## 2019-09-13 MED ORDER — SODIUM CHLORIDE 0.9 % IV SOLN
INTRAVENOUS | Status: DC
  Administered 2019-09-13: 1000 mL via INTRAVENOUS

## 2019-09-13 MED ORDER — GLYCOPYRROLATE 0.2 MG/ML IJ SOLN
INTRAMUSCULAR | Status: DC | PRN
  Administered 2019-09-13: .2 mg via INTRAVENOUS

## 2019-09-13 MED ORDER — PROPOFOL 10 MG/ML IV BOLUS
INTRAVENOUS | Status: DC | PRN
  Administered 2019-09-13: 40 mg via INTRAVENOUS
  Administered 2019-09-13: 20 mg via INTRAVENOUS

## 2019-09-13 NOTE — Anesthesia Preprocedure Evaluation (Signed)
Anesthesia Evaluation  Patient identified by MRN, date of birth, ID band Patient awake    Reviewed: Allergy & Precautions, NPO status , Patient's Chart, lab work & pertinent test results  History of Anesthesia Complications Negative for: history of anesthetic complications  Airway Mallampati: III  TM Distance: >3 FB Neck ROM: Full    Dental  (+) Upper Dentures, Lower Dentures   Pulmonary neg pulmonary ROS, neg sleep apnea, neg COPD,    breath sounds clear to auscultation- rhonchi (-) wheezing      Cardiovascular hypertension, Pt. on medications + CAD, + Past MI, + Cardiac Stents (2016) and +CHF   Rhythm:Regular Rate:Normal - Systolic murmurs and - Diastolic murmurs Echo 0000000: 1. The left ventricle has mild-moderately reduced systolic function, with  an ejection fraction of 40-45%. The cavity size was normal. There is  mildly increased left ventricular wall thickness. Left ventricular  diastolic Doppler parameters are consistent  with pseudonormalization. Unable to exclude inferior wall hypokinesis.  2. The right ventricle has normal systolic function. The cavity was  normal. There is no increase in right ventricular wall thickness. Right  ventricular systolic pressure is mildly elevated with an estimated  pressure of 37.9 mmHg.  3. Left atrial size was mildly dilated.  4. Moderate calcification of the aortic valve. Aortic valve regurgitation  is mild by color flow Doppler. mild stenosis of the aortic valve.  5. There is dilatation of the aortic root and of the ascending aorta.   L heart cath 02/2015:  Mid LAD lesion, 75% stenosed. Post intervention with a 2.5 x 16 Synergy drug-eluting stent, postdilated to greater than 3 mm in diameter, there is a 0% residual stenosis.  Diffuse disease in the left PDA which is a small vessel as well as distal circumflex. 80% lesion in a proximal nondominant RCA.  Normal LVEDP.    Neuro/Psych neg Seizures PSYCHIATRIC DISORDERS Anxiety CVA    GI/Hepatic Neg liver ROS, PUD, GERD  ,  Endo/Other  negative endocrine ROSneg diabetes  Renal/GU negative Renal ROS     Musculoskeletal  (+) Arthritis ,   Abdominal (+) - obese,   Peds  Hematology negative hematology ROS (+)   Anesthesia Other Findings Past Medical History: No date: Arthritis No date: CAD (coronary artery disease)     Comment:  a. 02/2015 NSTEMI/PCI: LM nl, LAd 4m (2.5x16 Synergy               DES), D1 50ost, D2 50ost, RI 25ost, LCX 25ost/p, 75d               (small), LPDA 90 (small), RCA 80p (small). No date: Cancer [facility])     Comment:  prostate CA No date: CHF (congestive heart failure) ([place]) No date: Claustrophobia     Comment:  "EXTREMELY" (03/02/2015) No date: Detached retina     Comment:  right No date: GERD (gastroesophageal reflux disease) No date: H/O dizziness No date: HFrEF (heart failure with reduced ejection fraction) ([place])     Comment:  a. 02/2015 Echo: EF 15-20%; b. 05/2015 Echo: EF 35-40%;               c. 06/2018 Echo: EF 40-45%, DD, Nl RV fxn. RVSP 37.55mmHg.               Mild LAE. Mild AS/AI. No date: History of prostate cancer No date: Hyperlipidemia No date: Hypertension No date: Impaired fasting glucose No date: Ischemic cardiomyopathy     Comment:  a. 02/2015 Echo:  EF 15-20%; b. 05/2015 Echo: EF 35-40%;               c. 06/2018 Echo: EF 40-45%. No date: PAF (paroxysmal atrial fibrillation) ([facility])     Comment:  a. Rate controlled. CHA2DS2VASc = 7--> Eliquis. ~ 1995-02/2009 X 2: Stroke [facility])     Comment:  a. 02/2008 MRI brain: prior infarct involving R lbe of               cerebellum w/ encephalomalcic change. No date: Urgency of urination   Reproductive/Obstetrics                             Anesthesia Physical Anesthesia Plan  ASA: III  Anesthesia Plan: General   Post-op Pain Management:    Induction: Intravenous  PONV Risk Score  and Plan: 1 and Propofol infusion  Airway Management Planned: Natural Airway  Additional Equipment:   Intra-op Plan:   Post-operative Plan:   Informed Consent: I have reviewed the patients History and Physical, chart, labs and discussed the procedure including the risks, benefits and alternatives for the proposed anesthesia with the patient or authorized representative who has indicated his/her understanding and acceptance.     Dental advisory given  Plan Discussed with: CRNA and Anesthesiologist  Anesthesia Plan Comments:         Anesthesia Quick Evaluation

## 2019-09-13 NOTE — Transfer of Care (Signed)
Immediate Anesthesia Transfer of Care Note  Patient: NAHZIR SCHULT  Procedure(s) Performed: COLONOSCOPY WITH PROPOFOL (N/A ) ESOPHAGOGASTRODUODENOSCOPY (EGD) WITH PROPOFOL (N/A )  Patient Location: PACU and Endoscopy Unit  Anesthesia Type:General  Level of Consciousness: drowsy  Airway & Oxygen Therapy: Patient Spontanous Breathing  Post-op Assessment: Report given to RN and Post -op Vital signs reviewed and stable  Post vital signs: Reviewed and stable  Last Vitals:  Vitals Value Taken Time  BP 123/64 09/13/19 1113  Temp 36.7 C 09/13/19 1113  Pulse 111 09/13/19 1114  Resp 26 09/13/19 1114  SpO2 98 % 09/13/19 1114  Vitals shown include unvalidated device data.  Last Pain:  Vitals:   09/13/19 1113  TempSrc: Temporal  PainSc: Asleep         Complications: No apparent anesthesia complications

## 2019-09-13 NOTE — H&P (Signed)
Jonathon Bellows, MD [address], [place], [place], [place], [address] [address], [place], [place], [place], [address] Phone: [phone]  Fax: [phone]  Primary Care Physician:  Venia Carbon, MD   Pre-Procedure History & Physical: HPI:  Kyle [name] is a 86 y.o. male is here for an endoscopy and colonoscopy    Past Medical History:  Diagnosis Date  . Arthritis   . CAD (coronary artery disease)    a. 02/2015 NSTEMI/PCI: LM nl, LAd 26m (2.5x16 Synergy DES), D1 50ost, D2 50ost, RI 25ost, LCX 25ost/p, 75d (small), LPDA 90 (small), RCA 80p (small).  . Cancer [facility])    prostate CA  . CHF (congestive heart failure) ([place])   . Claustrophobia    "EXTREMELY" (03/02/2015)  . Detached retina    right  . GERD (gastroesophageal reflux disease)   . H/O dizziness   . HFrEF (heart failure with reduced ejection fraction) ([place])    a. 02/2015 Echo: EF 15-20%; b. 05/2015 Echo: EF 35-40%; c. 06/2018 Echo: EF 40-45%, DD, Nl RV fxn. RVSP 37.60mmHg. Mild LAE. Mild AS/AI.  [name] History of prostate cancer   . Hyperlipidemia   . Hypertension   . Impaired fasting glucose   . Ischemic cardiomyopathy    a. 02/2015 Echo: EF 15-20%; b. 05/2015 Echo: EF 35-40%; c. 06/2018 Echo: EF 40-45%.  [name] PAF (paroxysmal atrial fibrillation) ([place])    a. Rate controlled. CHA2DS2VASc = 7--> Eliquis.  . Stroke [facility]) ~ 1995-02/2009 X 2   a. 02/2008 MRI brain: prior infarct involving R lbe of cerebellum w/ encephalomalcic change.  . Urgency of urination     Past Surgical History:  Procedure Laterality Date  . CARDIAC CATHETERIZATION  ~ 1995   Archie Endo 08/22/2010  . CARDIAC CATHETERIZATION N/A 03/02/2015   Procedure: Left Heart Cath and Coronary Angiography;  Surgeon: Jettie Booze, MD;  Location: [place] CV LAB;  Service: Cardiovascular;  Laterality: N/A;  . CARDIAC CATHETERIZATION  03/02/2015   Procedure: Coronary Stent Intervention;  Surgeon: Jettie Booze, MD;  Location: [place] CV LAB;   Service: Cardiovascular;;  . COLONOSCOPY W/ POLYPECTOMY    . ESOPHAGOGASTRODUODENOSCOPY (EGD) WITH PROPOFOL N/A 01/31/2017   Procedure: ESOPHAGOGASTRODUODENOSCOPY (EGD) WITH PROPOFOL;  Surgeon: Lucilla Lame, MD;  Location: [facility] ENDOSCOPY;  Service: Endoscopy;  Laterality: N/A;  . EYE SURGERY Right   . HERNIA REPAIR     hx UHR/notes 08/22/2010  . INGUINAL HERNIA REPAIR Left    Archie Endo 08/22/2010  . LUMBAR LAMINECTOMY/DECOMPRESSION MICRODISCECTOMY  01/24/2012   Procedure: LUMBAR LAMINECTOMY/DECOMPRESSION MICRODISCECTOMY 1 LEVEL;  Surgeon: Winfield Cunas, MD;  Location: [place] NEURO ORS;  Service: Neurosurgery;  Laterality: Right;  RIGHT Lumbar Three-Four far lateral diskectomy  . [place]   Archie Endo 08/22/2010  . RETINAL DETACHMENT REPAIR W/ SCLERAL BUCKLE LE Right 08/2006   Archie Endo 08/22/2010  . TONSILLECTOMY    . UMBILICAL HERNIA REPAIR     hx/notes 08/22/2010    Prior to Admission medications   Medication Sig Start Date End Date Taking? Authorizing Provider  atorvastatin (LIPITOR) 80 MG tablet TAKE ONE TABLET EVERY DAY AT 6PM 10/08/18  Yes Gollan, Kathlene November, MD  carvedilol (COREG) 3.125 MG tablet TAKE ONE TABLET TWICE DAILY 02/23/19  Yes Minus Breeding, MD  losartan (COZAAR) 50 MG tablet TAKE ONE TABLET EVERY DAY 10/08/18  Yes Gollan, Kathlene November, MD  pantoprazole (PROTONIX) 40 MG tablet Take 1 tablet (40 mg total) by mouth 2 (two) times daily before  a meal. 08/24/19  Yes Hochrein, [name], MD  traMADol (ULTRAM) 50 MG tablet TAKE 1 TABLET BY MOUTH 3 TIMES DAILY AS NEEDED 09/06/19  Yes Baity, Coralie Keens, NP  vitamin B-12 (CYANOCOBALAMIN) 1000 MCG tablet Take 1 tablet (1,000 mcg total) by mouth daily. 08/23/19 11/23/19 Yes Jonathon Bellows, MD  albuterol (PROVENTIL HFA;VENTOLIN HFA) 108 (90 Base) MCG/ACT inhaler Inhale 2 puffs into the lungs every 6 (six) hours as needed for wheezing or shortness of breath. 02/01/17   Hillary Bow, MD  ELIQUIS 5 MG TABS tablet TAKE ONE TABLET BY MOUTH TWICE DAILY 08/09/19    Minna Merritts, MD  furosemide (LASIX) 20 MG tablet TAKE 1 TABLET BY MOUTH DAILY 07/27/19   Minus Breeding, MD    Allergies as of 08/23/2019 - Review Complete 08/23/2019  Allergen Reaction Noted  . Simvastatin      Family History  Problem Relation Age of Onset  . Heart disease Mother   . Coronary artery disease Mother   . Heart disease Father   . Coronary artery disease Father   . Heart disease Brother   . Heart disease Brother   . Cancer Brother        prostate  . Diabetes Neg Hx     Social History   Socioeconomic History  . Marital status: Widowed    Spouse name: Not on file  . Number of children: 1  . Years of education: Not on file  . Highest education level: Not on file  Occupational History  . Occupation: retired    Fish farm manager: RETIRED  Tobacco Use  . Smoking status: Never Smoker  . Smokeless tobacco: Former Systems developer    Types: Chew  Substance and Sexual Activity  . Alcohol use: No  . Drug use: No  . Sexual activity: Not on file  Other Topics Concern  . Not on file  Social History Narrative   Wife died June 18, 2022   Has living will   Son is now his health care POA   Would accept CPR but no prolonged machines.    Would not want a feeding tube if cognitively unaware         Social Determinants of Health   Financial Resource Strain:   . Difficulty of Paying Living Expenses:   Food Insecurity:   . Worried About Charity fundraiser in the Last Year:   . Arboriculturist in the Last Year:   Transportation Needs:   . Film/video editor (Medical):   [name] Lack of Transportation (Non-Medical):   Physical Activity:   . Days of Exercise per Week:   . Minutes of Exercise per Session:   Stress:   . Feeling of Stress :   Social Connections:   . Frequency of Communication with Friends and Family:   . Frequency of Social Gatherings with Friends and Family:   . Attends Religious Services:   . Active Member of Clubs or Organizations:   . Attends Archivist  Meetings:   [name] Marital Status:   Intimate Partner Violence:   . Fear of Current or Ex-Partner:   . Emotionally Abused:   [name] Physically Abused:   . Sexually Abused:     Review of Systems: See HPI, otherwise negative ROS  Physical Exam: There were no vitals taken for this visit. General:   Alert,  pleasant and cooperative in NAD Head:  Normocephalic and atraumatic. Neck:  Supple; no masses or thyromegaly. Lungs:  Clear throughout to auscultation, normal respiratory  effort.    Heart:  +S1, +S2, Regular rate and rhythm, No edema. Abdomen:  Soft, nontender and nondistended. Normal bowel sounds, without guarding, and without rebound.   Neurologic:  Alert and  oriented x4;  grossly normal neurologically.  Impression/Plan: Kyle [name] is here for an endoscopy and colonoscopy  to be performed for  evaluation of anemia    Risks, benefits, limitations, and alternatives regarding endoscopy have been reviewed with the patient.  Questions have been answered.  All parties agreeable.   Jonathon Bellows, MD  09/13/2019, 9:48 AM

## 2019-09-13 NOTE — Op Note (Signed)
[facility] Gastroenterology Patient Name: Kyle [name] Procedure Date: 09/13/2019 9:48 AM MRN: [id number] Account #: [id number] Date of Birth: [birth year] Admit Type: Outpatient Age: 86 Room: [facility] ENDO ROOM 4 Gender: Male Note Status: Finalized Procedure:             Upper GI endoscopy Indications:           Iron deficiency anemia Providers:             Jonathon Bellows MD, MD Medicines:             Monitored Anesthesia Care Complications:         No immediate complications. Procedure:             Pre-Anesthesia Assessment:                        - Prior to the procedure, a History and Physical was                         performed, and patient medications, allergies and                         sensitivities were reviewed. The patient's tolerance                         of previous anesthesia was reviewed.                        - The risks and benefits of the procedure and the                         sedation options and risks were discussed with the                         patient. All questions were answered and informed                         consent was obtained.                        - ASA Grade Assessment: III - A patient with severe                         systemic disease.                        After obtaining informed consent, the endoscope was                         passed under direct vision. Throughout the procedure,                         the patient's blood pressure, pulse, and oxygen                         saturations were monitored continuously. The Endoscope                         was introduced through the mouth, and advanced to the  third part of duodenum. The upper GI endoscopy was                         accomplished with ease. The patient tolerated the                         procedure well. Findings:      The esophagus was normal.      The examined duodenum was normal.      The cardia and gastric fundus were normal  on retroflexion.      A single 10 mm semi-sessile polyp with no bleeding and no stigmata of       recent bleeding was found in the gastric antrum. The polyp was removed       with a hot snare. Resection and retrieval were complete. To prevent       bleeding after the polypectomy, one hemostatic clip was successfully       placed. There was no bleeding during, or at the end, of the procedure. Impression:            - Normal esophagus.                        - Normal examined duodenum.                        - A single gastric polyp. Resected and retrieved. Clip                         was placed. Recommendation:        - Await pathology results.                        - Perform a colonoscopy today. Procedure Code(s):     --- Professional ---                        [phone], Esophagogastroduodenoscopy, flexible,                         transoral; with removal of tumor(s), polyp(s), or                         other lesion(s) by snare technique Diagnosis Code(s):     --- Professional ---                        K31.7, Polyp of stomach and duodenum                        D50.9, Iron deficiency anemia, unspecified CPT copyright 2019 American Medical Association. All rights reserved. The codes documented in this report are preliminary and upon coder review may  be revised to meet current compliance requirements. Jonathon Bellows, MD Jonathon Bellows MD, MD 09/13/2019 10:31:47 AM This report has been signed electronically. Number of Addenda: 0 Note Initiated On: 09/13/2019 9:48 AM Estimated Blood Loss:  Estimated blood loss: none.      [facility]

## 2019-09-13 NOTE — Anesthesia Postprocedure Evaluation (Signed)
Anesthesia Post Note  Patient: Kyle [name]  Procedure(s) Performed: COLONOSCOPY WITH PROPOFOL (N/A ) ESOPHAGOGASTRODUODENOSCOPY (EGD) WITH PROPOFOL (N/A )  Patient location during evaluation: Endoscopy Anesthesia Type: General Level of consciousness: awake and alert and oriented Pain management: pain level controlled Vital Signs Assessment: post-procedure vital signs reviewed and stable Respiratory status: spontaneous breathing, nonlabored ventilation and respiratory function stable Cardiovascular status: blood pressure returned to baseline and stable Postop Assessment: no signs of nausea or vomiting Anesthetic complications: no     Last Vitals:  Vitals:   09/13/19 1113 09/13/19 1133  BP: 123/64 102/68  Pulse: 86   Resp: 16   Temp: 36.7 C   SpO2:      Last Pain:  Vitals:   09/13/19 1133  TempSrc:   PainSc: 0-No pain                 Willow Reczek

## 2019-09-13 NOTE — Op Note (Signed)
[facility] Gastroenterology Patient Name: Kyle [name] Procedure Date: 09/13/2019 9:47 AM MRN: [id number] Account #: [id number] Date of Birth: [birth year] Admit Type: Outpatient Age: 86 Room: [facility] ENDO ROOM 4 Gender: Male Note Status: Finalized Procedure:             Colonoscopy Indications:           Iron deficiency anemia Providers:             Jonathon Bellows MD, MD Medicines:             Monitored Anesthesia Care Complications:         No immediate complications. Procedure:             Pre-Anesthesia Assessment:                        - Prior to the procedure, a History and Physical was                         performed, and patient medications, allergies and                         sensitivities were reviewed. The patient's tolerance                         of previous anesthesia was reviewed.                        - The risks and benefits of the procedure and the                         sedation options and risks were discussed with the                         patient. All questions were answered and informed                         consent was obtained.                        - ASA Grade Assessment: III - A patient with severe                         systemic disease.                        After obtaining informed consent, the colonoscope was                         passed under direct vision. Throughout the procedure,                         the patient's blood pressure, pulse, and oxygen                         saturations were monitored continuously. The                         Colonoscope was introduced through the anus and  advanced to the the cecum, identified by the                         appendiceal orifice. The colonoscopy was performed                         with ease. The patient tolerated the procedure well.                         The quality of the bowel preparation was excellent. Findings:      The perianal and digital  rectal examinations were normal.      Three sessile polyps were found in the descending colon. The polyps were       5 to 8 mm in size. These polyps were removed with a cold snare.       Resection and retrieval were complete.      Multiple sessile polyps were found in the ascending colon. The polyps       were 6 to 8 mm in size. These polyps were removed with a cold snare.       Resection and retrieval were complete.      Multiple sessile polyps were found in the transverse colon. The polyps       were 7 to 10 mm in size. These polyps were removed with a cold snare.       Resection and retrieval were complete.      Multiple sessile polyps were found in the sigmoid colon and descending       colon. The polyps were 6 to 9 mm in size. These polyps were removed with       a cold snare. Resection and retrieval were complete.      The mucosa vascular pattern in the entire colon was diffusely decreased.       This was biopsied with a cold forceps for histology.      The exam was otherwise without abnormality on direct and retroflexion       views. Impression:            - Three 5 to 8 mm polyps in the descending colon,                         removed with a cold snare. Resected and retrieved.                        - Multiple 6 to 8 mm polyps in the ascending colon,                         removed with a cold snare. Resected and retrieved.                        - Multiple 7 to 10 mm polyps in the transverse colon,                         removed with a cold snare. Resected and retrieved.                        - Multiple 6 to 9 mm polyps in the sigmoid colon and  in the descending colon, removed with a cold snare.                         Resected and retrieved.                        - Decreased mucosa vascular pattern in the entire                         examined colon. Biopsied.                        - The examination was otherwise normal on direct and                          retroflexion views. Recommendation:        - Discharge patient to home (with escort).                        - Resume previous diet.                        - Continue present medications.                        - Resume Eliquis (apixaban) at prior dose in 2 days.                        - Await pathology results.                        - Return to my office as previously scheduled. Procedure Code(s):     --- Professional ---                        [id number], Colonoscopy, flexible; with removal of                         tumor(s), polyp(s), or other lesion(s) by snare                         technique                        [id number], 77, Colonoscopy, flexible; with biopsy, single                         or multiple Diagnosis Code(s):     --- Professional ---                        K63.5, Polyp of colon                        D50.9, Iron deficiency anemia, unspecified CPT copyright 2019 American Medical Association. All rights reserved. The codes documented in this report are preliminary and upon coder review may  be revised to meet current compliance requirements. Jonathon Bellows, MD Jonathon Bellows MD, MD 09/13/2019 11:14:23 AM This report has been signed electronically. Number of Addenda: 0 Note Initiated On: 09/13/2019 9:47 AM Scope Withdrawal Time: 0 hours 27 minutes 4 seconds  Total Procedure Duration: 0 hours 33 minutes  11 seconds  Estimated Blood Loss:  Estimated blood loss: none.      [facility]

## 2019-09-15 ENCOUNTER — Encounter: Payer: Self-pay | Admitting: *Deleted

## 2019-09-15 LAB — SURGICAL PATHOLOGY

## 2019-09-17 ENCOUNTER — Other Ambulatory Visit: Payer: Self-pay | Admitting: Internal Medicine

## 2019-09-17 NOTE — Telephone Encounter (Signed)
Since it was removed from his med list, I will have to get Dr Silvio Pate to approve.

## 2019-09-21 ENCOUNTER — Telehealth: Payer: Self-pay

## 2019-09-21 NOTE — Telephone Encounter (Signed)
-----   Message from Jonathon Bellows, MD sent at 09/21/2019  9:50 AM EDT ----- Kyle [name] please inform   gastric biopsies showed no abnormalities.  Multiple colon polyps were resected.  In view of his age we will discuss options at his next follow-up visit.  C/c Venia Carbon, MD   Dr Jonathon Bellows MD,MRCP [facility]) Gastroenterology/Hepatology Pager: [phone]

## 2019-09-21 NOTE — Telephone Encounter (Signed)
Spoke with pt and informed him of biopsy results. 

## 2019-10-04 ENCOUNTER — Other Ambulatory Visit: Payer: Self-pay | Admitting: Cardiovascular Disease

## 2019-10-04 DIAGNOSIS — I5021 Acute systolic (congestive) heart failure: Secondary | ICD-10-CM

## 2019-10-05 ENCOUNTER — Other Ambulatory Visit: Payer: Self-pay

## 2019-10-05 ENCOUNTER — Ambulatory Visit (INDEPENDENT_AMBULATORY_CARE_PROVIDER_SITE_OTHER): Payer: Medicare Other | Admitting: Gastroenterology

## 2019-10-05 ENCOUNTER — Ambulatory Visit: Payer: Medicare Other | Admitting: Gastroenterology

## 2019-10-05 VITALS — BP 124/70 | HR 75 | Temp 97.4°F | Wt 176.0 lb

## 2019-10-05 DIAGNOSIS — I255 Ischemic cardiomyopathy: Secondary | ICD-10-CM

## 2019-10-05 DIAGNOSIS — Z86010 Personal history of colonic polyps: Secondary | ICD-10-CM

## 2019-10-05 DIAGNOSIS — D509 Iron deficiency anemia, unspecified: Secondary | ICD-10-CM

## 2019-10-05 DIAGNOSIS — E538 Deficiency of other specified B group vitamins: Secondary | ICD-10-CM

## 2019-10-05 NOTE — Progress Notes (Signed)
Kyle Bellows MD, MRCP(U.K) [address]  [place]  [place], [place] [address]  Main: [phone]  Fax: [phone]   Primary Care Physician: Kyle Carbon, MD  Primary Gastroenterologist:  Dr. Jonathon [name]   Follow-up for microcytic anemia and colon polyps  HPI: Kyle [name] is a 86 y.o. male    Summary of history :  Initially referred and seen on 08/23/2019 for GI bleed.Recently seen by Dr. Silvio [name] in March 2021 for routine general medical exam.  Was found to be anemic and had a positive fit test.Hemoglobin 8.8 g with an MCV of 81.3.  The year back hemoglobin is 11.1 g with an MCV of 80.5.  B12 was 133.  He did have a history of epistaxis.  Seen by ENT.  No other overt blood loss.  Interval history 08/23/2019-10/03/2019  09/13/2019: EGD:10 mm polyp was seen in the gastric antrum that was resected with a hot snare that was benign.. Colonoscopy:   Multiple sessile polyps noted in the descending ascending and transverse colon which were all resected.  There were all tubular adenomas.  08/23/2019: B12 levels normal.  Urinalysis shows no blood loss.  Iron 25 but ferritin 85.  Celiac serology normal.  H. pylori breath test negative.  The patient was accompanied by his adopted son.  Does not have any biological children.  Having normal brown bowel movements.  No further episodes of epistaxis.  No other complaints.  Current Outpatient Medications  Medication Sig Dispense Refill  . albuterol (PROVENTIL HFA;VENTOLIN HFA) 108 (90 Base) MCG/ACT inhaler Inhale 2 puffs into the lungs every 6 (six) hours as needed for wheezing or shortness of breath. 1 Inhaler 0  . atorvastatin (LIPITOR) 80 MG tablet TAKE 1 TABLET BY MOUTH EVERY DAY AT 6PM 90 tablet 3  . carvedilol (COREG) 3.125 MG tablet TAKE ONE TABLET TWICE DAILY 180 tablet 1  . COLCRYS 0.6 MG tablet TAKE ONE TABLET TWICE DAILY AS NEEDED FOR JOINT SWELLING 60 tablet 3  . ELIQUIS 5 MG TABS tablet TAKE ONE TABLET BY MOUTH TWICE DAILY 180  tablet 0  . furosemide (LASIX) 20 MG tablet TAKE 1 TABLET BY MOUTH DAILY 30 tablet 11  . losartan (COZAAR) 50 MG tablet TAKE 1 TABLET BY MOUTH EVERY DAY 90 tablet 3  . pantoprazole (PROTONIX) 40 MG tablet Take 1 tablet (40 mg total) by mouth 2 (two) times daily before a meal. 60 tablet 2  . traMADol (ULTRAM) 50 MG tablet TAKE 1 TABLET BY MOUTH 3 TIMES DAILY AS NEEDED 90 tablet 0  . vitamin B-12 (CYANOCOBALAMIN) 1000 MCG tablet Take 1 tablet (1,000 mcg total) by mouth daily. 90 tablet 1   No current facility-administered medications for this visit.    Allergies as of 10/05/2019 - Review Complete 09/13/2019  Allergen Reaction Noted  . Simvastatin      ROS:  General: Negative for anorexia, weight loss, fever, chills, fatigue, weakness. ENT: Negative for hoarseness, difficulty swallowing , nasal congestion. CV: Negative for chest pain, angina, palpitations, dyspnea on exertion, peripheral edema.  Respiratory: Negative for dyspnea at rest, dyspnea on exertion, cough, sputum, wheezing.  GI: See history of present illness. GU:  Negative for dysuria, hematuria, urinary incontinence, urinary frequency, nocturnal urination.  Endo: Negative for unusual weight change.    Physical Examination:   There were no vitals taken for this visit.  General: Well-nourished, well-developed in no acute distress.  Eyes: No icterus. Conjunctivae pink. Neuro: Alert and oriented x 3.  Grossly intact.  Skin: Warm and dry, no jaundice.   Psych: Alert and cooperative, normal mood and affect.   Imaging Studies: No results found.  Assessment and Plan:   Kyle [name] is a 86 y.o. y/o male here to follow-up for microcytic anemia along with B12 deficiency.     Anemia is new in onset.  Recent history of epistaxis which may have caused a drop in the hemoglobin.  EGD demonstrated a benign gastric polyp that was resected.  Colonoscopy showed multiple precancerous polyps.  All were resected.   Plan 1.  Check CBC  today and if normalizing then closely watch but if not improving then will require capsule study of small bowel 2.  B12 levels have been normalized can discuss with Dr. Silvio [name] And if required can consider changing to oral B12 supplementation. 3.  The patient does not have any biological children and hence I would not recommend genetic testing despite having multiple precancerous polyps of the colon.  In view of his age I would not recommend surveillance colonoscopy for colon cancer screening.  Dr Kyle Bellows  MD,MRCP [facility]) Follow up in as needed

## 2019-10-18 ENCOUNTER — Other Ambulatory Visit: Payer: Self-pay | Admitting: Internal Medicine

## 2019-10-19 NOTE — Telephone Encounter (Signed)
Last filled 09-07-19 #90 Last OV 07-02-19 Next OV 07-04-20 [facility]

## 2019-11-01 ENCOUNTER — Telehealth: Payer: Self-pay | Admitting: Cardiology

## 2019-11-03 NOTE — Telephone Encounter (Signed)
Please advise If ok to refill Pantoprazole 40 mg tablet bid. Last filled by Hochrein.

## 2019-11-04 NOTE — Telephone Encounter (Signed)
Kyle [name],  You saw this patient last on 09/01/19. I feel this may be more of PCP or GI to refill. Please advise.  Thanks!

## 2019-11-04 NOTE — Telephone Encounter (Signed)
Refill sent in with message to pharmacy to contact PCP or GI for future refills.

## 2019-11-04 NOTE — Telephone Encounter (Signed)
Hi Anderson Malta,  It was initially prescribed at hospital discharge for gastric ulcer in 2018. Looks like we refilled for him at a hospital follow up and have been refilling since.   Why don't we send him a 90 day supply with 0 refills and I will reach out to his PCP as well as GI to get their recommendations. Hopefully they will fill from here on out, but I don't want him to go without since he's been on it for 3 years.  Thanks! [name], NP

## 2019-11-05 MED ORDER — PANTOPRAZOLE SODIUM 40 MG PO TBEC: 40.0000 mg | DELAYED_RELEASE_TABLET | Freq: Every day | ORAL | 0 refills | Status: DC

## 2019-11-05 MED ORDER — PANTOPRAZOLE SODIUM 40 MG PO TBEC: 40.0000 mg | DELAYED_RELEASE_TABLET | Freq: Every day | ORAL | 0 refills | Status: AC

## 2019-11-05 NOTE — Telephone Encounter (Signed)
Patient notified of the dosage change and verbalized understanding to decrease protonix to 40 mg once a day. Rx sent to pharmacy.

## 2019-11-05 NOTE — Telephone Encounter (Signed)
Spoke with Dr. Vicente Males (GI) as well as PCP (Dr. Silvio Pate).   Dr. Silvio Pate was agreeable to provide further refills. He did recommend [name] decrease to Pantoprazole 40mg  daily.  [name], NP

## 2019-11-05 NOTE — Addendum Note (Signed)
Addended by: Annia Belt on: 11/05/2019 09:01 AM   Modules accepted: Orders

## 2019-11-29 ENCOUNTER — Other Ambulatory Visit: Payer: Self-pay | Admitting: Cardiology

## 2019-11-29 ENCOUNTER — Other Ambulatory Visit: Payer: Self-pay | Admitting: Internal Medicine

## 2019-11-29 NOTE — Telephone Encounter (Signed)
Last filled 10-19-19 #90 Last OV 07-02-19 Next OV 07-04-20 [facility]

## 2019-12-13 ENCOUNTER — Other Ambulatory Visit: Payer: Self-pay | Admitting: Cardiovascular Disease

## 2019-12-13 NOTE — Telephone Encounter (Signed)
Please review for refill. Thanks!  

## 2019-12-13 NOTE — Telephone Encounter (Signed)
Pt's age 86, wt 79.8 kg, SCr 1.34, CrCl 44.66, last ov w/ CW 09/01/19.

## 2019-12-23 ENCOUNTER — Other Ambulatory Visit: Payer: Medicare Other

## 2019-12-27 ENCOUNTER — Other Ambulatory Visit: Payer: Self-pay | Admitting: Internal Medicine

## 2019-12-28 NOTE — Telephone Encounter (Signed)
Last filled 11-29-19 #90 Last OV 07-02-19 Next OV 07-05-19 [facility]

## 2020-01-12 ENCOUNTER — Ambulatory Visit (INDEPENDENT_AMBULATORY_CARE_PROVIDER_SITE_OTHER): Payer: Medicare Other

## 2020-01-12 ENCOUNTER — Other Ambulatory Visit: Payer: Self-pay

## 2020-01-12 DIAGNOSIS — I255 Ischemic cardiomyopathy: Secondary | ICD-10-CM | POA: Diagnosis not present

## 2020-01-12 DIAGNOSIS — I350 Nonrheumatic aortic (valve) stenosis: Secondary | ICD-10-CM

## 2020-01-12 LAB — ECHOCARDIOGRAM COMPLETE
AR max vel: 0.99 cm2
AV Area VTI: 1.02 cm2
AV Area mean vel: 1.01 cm2
AV Mean grad: 13.5 mmHg
AV Peak grad: 26.3 mmHg
Ao pk vel: 2.57 m/s
Calc EF: 26.8 %
S' Lateral: 3.4 cm
Single Plane A2C EF: 22.5 %
Single Plane A4C EF: 32 %

## 2020-01-13 ENCOUNTER — Telehealth: Payer: Self-pay

## 2020-01-13 NOTE — Telephone Encounter (Signed)
Needs appt this week.   Attempted to call patient. [facility] 01/13/2020

## 2020-01-13 NOTE — Telephone Encounter (Signed)
-----   Message from [name], NP sent at 01/13/2020  8:19 AM EDT ----- Kyle [name] echocardiogram shows his aortic stenosis is now severe. Can we get him into a clinic slot this week with Gollan or APP to discuss need for cardiac cath? Thank you!

## 2020-01-13 NOTE — Telephone Encounter (Signed)
Call to patient to review echo results.    Spoke to son, he verbalized understanding and has no further questions at this time.    Advised pt to call for any further questions or concerns.  No further orders.   made appt for tomorrow at 1:30P.

## 2020-01-14 ENCOUNTER — Other Ambulatory Visit
Admission: RE | Admit: 2020-01-14 | Discharge: 2020-01-14 | Disposition: A | Discharge status: Home / Self Care | Payer: Medicare Other | Source: Ambulatory Visit | Attending: Internal Medicine | Admitting: Internal Medicine

## 2020-01-14 ENCOUNTER — Other Ambulatory Visit: Payer: Self-pay

## 2020-01-14 ENCOUNTER — Encounter: Payer: Self-pay | Admitting: Nurse Practitioner

## 2020-01-14 ENCOUNTER — Ambulatory Visit (INDEPENDENT_AMBULATORY_CARE_PROVIDER_SITE_OTHER): Payer: Medicare Other | Admitting: Nurse Practitioner

## 2020-01-14 VITALS — BP 130/78 | HR 66 | Ht 72.0 in | Wt 173.0 lb

## 2020-01-14 DIAGNOSIS — Z01812 Encounter for preprocedural laboratory examination: Secondary | ICD-10-CM | POA: Diagnosis not present

## 2020-01-14 DIAGNOSIS — E785 Hyperlipidemia, unspecified: Secondary | ICD-10-CM | POA: Diagnosis not present

## 2020-01-14 DIAGNOSIS — Z01810 Encounter for preprocedural cardiovascular examination: Secondary | ICD-10-CM

## 2020-01-14 DIAGNOSIS — I25118 Atherosclerotic heart disease of native coronary artery with other forms of angina pectoris: Secondary | ICD-10-CM | POA: Diagnosis not present

## 2020-01-14 DIAGNOSIS — I350 Nonrheumatic aortic (valve) stenosis: Secondary | ICD-10-CM

## 2020-01-14 DIAGNOSIS — N182 Chronic kidney disease, stage 2 (mild): Secondary | ICD-10-CM

## 2020-01-14 DIAGNOSIS — I255 Ischemic cardiomyopathy: Secondary | ICD-10-CM

## 2020-01-14 DIAGNOSIS — I5020 Unspecified systolic (congestive) heart failure: Secondary | ICD-10-CM

## 2020-01-14 DIAGNOSIS — I10 Essential (primary) hypertension: Secondary | ICD-10-CM | POA: Diagnosis not present

## 2020-01-14 DIAGNOSIS — Z20822 Contact with and (suspected) exposure to covid-19: Secondary | ICD-10-CM | POA: Insufficient documentation

## 2020-01-14 DIAGNOSIS — I4821 Permanent atrial fibrillation: Secondary | ICD-10-CM

## 2020-01-14 NOTE — Patient Instructions (Addendum)
Medication Instructions:  HOLD Eliquis 3 days prior to cardiac cath.  *If you need a refill on your cardiac medications before your next appointment, please call your pharmacy*   Lab Work: CBC and BMP today  If you have labs (blood work) drawn today and your tests are completely normal, you will receive your results only by:  [place] (if you have MyChart) OR  A paper copy in the mail If you have any lab test that is abnormal or we need to change your treatment, we will call you to review the results.  COVID testing today at the [facility] ([address] [place]). Please call the number on the door when you arrive.  Testing/Procedures: You are scheduled for a Cardiac Catheterization on Tuesday, September 28 with Dr. Harrell Gave End.  1. Please arrive at the [facility] Entrance at 7:30 AM (This time is one hour before your procedure to ensure your preparation). Free valet parking service is available.   Special note: Every effort is made to have your procedure done on time. Please understand that emergencies sometimes delay scheduled procedures.  2. Diet: Do not eat solid foods after midnight.  The patient may have clear liquids until 5am upon the day of the procedure.  3. Labs: You will need to have blood drawn on Friday, September 24 at [facility], Go to 1st desk on your right to register.  Address: [address] [place], [place] [address]  Open: 8am - 5pm  Phone: [phone]. You do not need to be fasting.  4. Medication instructions in preparation for your procedure:   Contrast Allergy: No  Stop taking Eliquis (Apixiban) on Saturday, September 25.  Stop taking, Lasix (Furosemide)  Monday, September 27,  On the morning of your procedure, take your Aspirin 81mg  and any morning medicines NOT listed above.  You may use sips of water.  5. Plan for one night stay--bring personal belongings. 6. Bring a current list of your medications and current  insurance cards. 7. You MUST have a responsible person to drive you home. 8. Someone MUST be with you the first 24 hours after you arrive home or your discharge will be delayed. 9. Please wear clothes that are easy to get on and off and wear slip-on shoes.  Thank you for allowing [place] to care for you!   -- [place] [facility]  Follow-Up: At [facility], you and your health needs are our priority.  As part of our continuing mission to provide you with exceptional heart care, we have created designated Provider Care Teams.  These Care Teams include your primary Cardiologist (physician) and Advanced Practice Providers (APPs -  Physician Assistants and Nurse Practitioners) who all work together to provide you with the care you need, when you need it.  We recommend signing up for the patient portal called "MyChart".  Sign up information is provided on this After Visit Summary.  MyChart is used to connect with patients for Virtual Visits (Telemedicine).  Patients are able to view lab/test results, encounter notes, upcoming appointments, etc.  Non-urgent messages can be sent to your provider as well.   To learn more about what you can do with MyChart, go to [email or web address].    Your next appointment:   1-2 week(s)  The format for your next appointment:   In Person  Provider:   You may see Ida Rogue, MD or one of the following Advanced Practice Providers on your designated Care Team:  Murray Hodgkins, NP  Christell Faith, PA-C  Marrianne Mood, PA-C  Cadence Kathlen Mody, PA-C  You have been referred to the TAVR Clinic in [place]. Someone will call you to schedule. If you do not receive a call, please let us know.

## 2020-01-14 NOTE — Progress Notes (Signed)
Cardiology Clinic Note   Patient Name: Kyle [name] Date of Encounter: 01/14/2020  Primary Care Provider:  Venia Carbon, MD Primary Cardiologist:  Ida Rogue, MD  Patient Profile    86 year old male with a history of CAD status post prior non-STEMI and LAD stenting in 2016, ischemic cardiomyopathy with an EF of 35-40 %, HFrEF, permanent atrial fibrillation on Eliquis, hypertension, hyperlipidemia, prior cerebellar stroke, and prostate cancer, who presents for follow-up due to progressive dyspnea, angina, and severe aortic stenosis on recent echocardiogram.  Past Medical History    Past Medical History:  Diagnosis Date  . Arthritis   . CAD (coronary artery disease)    a. 02/2015 NSTEMI/PCI: LM nl, LAd 34m (2.5x16 Synergy DES), D1 50ost, D2 50ost, RI 25ost, LCX 25ost/p, 75d (small), LPDA 90 (small), RCA 80p (small).  . Cancer [facility])    prostate CA  . Claustrophobia    "EXTREMELY" (03/02/2015)  . Detached retina    right  . GERD (gastroesophageal reflux disease)   . H/O dizziness   . HFrEF (heart failure with reduced ejection fraction) ([place])    a. 02/2015 Echo: EF 15-20%; b. 05/2015 Echo: EF 35-40%; c. 06/2018 Echo: EF 40-45%, DD, Nl RV fxn. RVSP 37.31mmHg. Mild LAE. Mild AS/AI; d. 12/2019 Echo: EF 35-40%, mod LVH, Gr2 DD, RVSP 50, mild LAE, mild MR, low flow, low grad Sev AS (peak grad 58mmHg, AVA 0.89cm2). Mild AI. Asc Ao 68mm.  [name] History of prostate cancer   . Hyperlipidemia   . Hypertension   . Impaired fasting glucose   . Ischemic cardiomyopathy    a. 02/2015 Echo: EF 15-20%; b. 05/2015 Echo: EF 35-40%; c. 06/2018 Echo: EF 40-45%; d. 12/2019 Echo: EF 35-40%.  [name] PAF (paroxysmal atrial fibrillation) ([place])    a. Rate controlled. CHA2DS2VASc = 7--> Eliquis.  . Severe aortic stenosis    a. 12/2019 Echo: low flow, low grad Sev AS (peak grad 58mmHg, AVA 0.89cm2).  . Stroke [facility]) ~ 1995-02/2009 X 2   a. 02/2008 MRI brain: prior infarct involving R lbe of cerebellum w/  encephalomalcic change.  . Urgency of urination    Past Surgical History:  Procedure Laterality Date  . CARDIAC CATHETERIZATION  ~ 1995   Archie Endo 08/22/2010  . CARDIAC CATHETERIZATION N/A 03/02/2015   Procedure: Left Heart Cath and Coronary Angiography;  Surgeon: Jettie Booze, MD;  Location: [place] CV LAB;  Service: Cardiovascular;  Laterality: N/A;  . CARDIAC CATHETERIZATION  03/02/2015   Procedure: Coronary Stent Intervention;  Surgeon: Jettie Booze, MD;  Location: [place] CV LAB;  Service: Cardiovascular;;  . COLONOSCOPY W/ POLYPECTOMY    . COLONOSCOPY WITH PROPOFOL N/A 09/13/2019   Procedure: COLONOSCOPY WITH PROPOFOL;  Surgeon: Jonathon Bellows, MD;  [facility] ENDOSCOPY;  Service: Gastroenterology;  Laterality: N/A;  . ESOPHAGOGASTRODUODENOSCOPY (EGD) WITH PROPOFOL N/A 01/31/2017   Procedure: ESOPHAGOGASTRODUODENOSCOPY (EGD) WITH PROPOFOL;  Surgeon: Lucilla Lame, MD;  Location: [facility] ENDOSCOPY;  Service: Endoscopy;  Laterality: N/A;  . ESOPHAGOGASTRODUODENOSCOPY (EGD) WITH PROPOFOL N/A 09/13/2019   Procedure: ESOPHAGOGASTRODUODENOSCOPY (EGD) WITH PROPOFOL;  Surgeon: Jonathon Bellows, MD;  Location: [facility] ENDOSCOPY;  Service: Gastroenterology;  Laterality: N/A;  . EYE SURGERY Right   . HERNIA REPAIR     hx UHR/notes 08/22/2010  . INGUINAL HERNIA REPAIR Left    Archie Endo 08/22/2010  . LUMBAR LAMINECTOMY/DECOMPRESSION MICRODISCECTOMY  01/24/2012   Procedure: LUMBAR LAMINECTOMY/DECOMPRESSION MICRODISCECTOMY 1 LEVEL;  Surgeon: Winfield Cunas, MD;  Location: [place] NEURO ORS;  Service: Neurosurgery;  Laterality:  Right;  RIGHT Lumbar Three-Four far lateral diskectomy  . [place]   Archie Endo 08/22/2010  . RETINAL DETACHMENT REPAIR W/ SCLERAL BUCKLE LE Right 08/2006   Archie Endo 08/22/2010  . TONSILLECTOMY    . UMBILICAL HERNIA REPAIR     hx/notes 08/22/2010    Allergies  Allergies  Allergen Reactions  . Simvastatin     REACTION: myalgias    History of Present Illness      86 year old male with the above complex past medical history including CAD, ischemic cardiomyopathy, HFrEF, permanent atrial fibrillation on Eliquis, hypertension, hyperlipidemia, prior cerebellar stroke, and prostate cancer.  In November 2016, he suffered a non-STEMI in the setting of rapid atrial fibrillation.  He was found to have severe LV dysfunction with an EF of 15 to 20% at the time and subsequent catheterization revealed severe multivessel disease.  The culprit was felt to be the LAD and this was successfully treated with a drug-eluting stent.  He has residual small vessel circumflex, L PDA, and RCA disease.  LV function improved following revascularization, initially up to 35-40% in 2017 and then 40-45% in March 2020.  At the time of that echo, only mild aortic stenosis was noted.  He was last seen in cardiology clinic in May of this year as he was pending colonoscopy in the setting of microcytic anemia at that time.  He was doing well from a cardiac standpoint.  Subsequent colonoscopy showed multiple small polyps which were resected.  EGD showed a single gastric polyp which was resected.  Over the past 2 months, he has noted a significant decline in activity tolerance with dyspnea on exertion and intermittent exertional substernal chest pressure occurring after walking approximately 30 yards to his mailbox.  Symptoms improved with rest but will recur with additional exertion.  He has not required any nitrates.  He has chronic, stable and mild lower extremity swelling for which he occasionally uses Lasix.  This is prescribed daily but he is not taking it as such as he does not like having to run to the bathroom so frequently.  An echocardiogram was ordered for him back in May and he initially deferred having this performed but in the setting of worsening symptoms, he just had this performed earlier this week revealing worsening of LV function with an EF of 21-30%, grade 2 diastolic dysfunction, and  RVSP of 50 mmHg, and low-flow, low gradient severe aortic stenosis with a peak gradient of only 28 mmHg but a valve area of 0.89 cm.  He is interested in pursuing TAVR work-up.  Home Medications    Prior to Admission medications   Medication Sig Start Date End Date Taking? Authorizing Provider  albuterol (PROVENTIL HFA;VENTOLIN HFA) 108 (90 Base) MCG/ACT inhaler Inhale 2 puffs into the lungs every 6 (six) hours as needed for wheezing or shortness of breath. 02/01/17  Yes Sudini, Alveta Heimlich, MD  atorvastatin (LIPITOR) 80 MG tablet TAKE 1 TABLET BY MOUTH EVERY DAY AT 6PM 10/04/19  Yes Gollan, Kathlene November, MD  carvedilol (COREG) 3.125 MG tablet Take 1 tablet (3.125 mg total) by mouth 2 (two) times daily. 11/30/19  Yes Minus Breeding, MD  COLCRYS 0.6 MG tablet TAKE ONE TABLET TWICE DAILY AS NEEDED FOR JOINT SWELLING 09/17/19  Yes Venia Carbon, MD  ELIQUIS 5 MG TABS tablet TAKE ONE TABLET BY MOUTH TWICE DAILY 12/13/19  Yes Minna Merritts, MD  furosemide (LASIX) 20 MG tablet TAKE 1 TABLET BY MOUTH DAILY 07/27/19  Yes Minus Breeding,  MD  losartan (COZAAR) 50 MG tablet TAKE 1 TABLET BY MOUTH EVERY DAY 10/04/19  Yes Gollan, Kathlene November, MD  pantoprazole (PROTONIX) 40 MG tablet Take 1 tablet (40 mg total) by mouth daily. Please decrease dose to once a day. 11/05/19  Yes [name], NP  traMADol (ULTRAM) 50 MG tablet TAKE 1 TABLET BY MOUTH THREE TIMES DAILY AS NEEDED 12/29/19  Yes Venia Carbon, MD    Family History    Family History  Problem Relation Age of Onset  . Heart disease Mother   . Coronary artery disease Mother   . Heart disease Father   . Coronary artery disease Father   . Heart disease Brother   . Heart disease Brother   . Cancer Brother        prostate  . Diabetes Neg Hx    He indicated that his mother is deceased. He indicated that his father is deceased. He indicated that all of his three sisters are alive. He indicated that two of his five brothers are alive. He indicated  that his maternal grandmother is deceased. He indicated that his maternal grandfather is deceased. He indicated that his paternal grandmother is deceased. He indicated that his paternal grandfather is deceased. He indicated that the status of his neg hx is unknown.  Social History    Social History   Socioeconomic History  . Marital status: Widowed    Spouse name: Not on file  . Number of children: 1  . Years of education: Not on file  . Highest education level: Not on file  Occupational History  . Occupation: retired    Fish farm manager: RETIRED  Tobacco Use  . Smoking status: Never Smoker  . Smokeless tobacco: Former Systems developer    Types: Secondary school teacher  . Vaping Use: Former  Substance and Sexual Activity  . Alcohol use: No  . Drug use: No  . Sexual activity: Not on file  Other Topics Concern  . Not on file  Social History Narrative   Wife died Jun 13, 2022   Has living will   Son is now his health care POA   Would accept CPR but no prolonged machines.    Would not want a feeding tube if cognitively unaware         Social Determinants of Health   Financial Resource Strain:   . Difficulty of Paying Living Expenses: Not on file  Food Insecurity:   . Worried About Charity fundraiser in the Last Year: Not on file  . Ran Out of Food in the Last Year: Not on file  Transportation Needs:   . Lack of Transportation (Medical): Not on file  . Lack of Transportation (Non-Medical): Not on file  Physical Activity:   . Days of Exercise per Week: Not on file  . Minutes of Exercise per Session: Not on file  Stress:   . Feeling of Stress : Not on file  Social Connections:   . Frequency of Communication with Friends and Family: Not on file  . Frequency of Social Gatherings with Friends and Family: Not on file  . Attends Religious Services: Not on file  . Active Member of Clubs or Organizations: Not on file  . Attends Archivist Meetings: Not on file  . Marital Status: Not on file   Intimate Partner Violence:   . Fear of Current or Ex-Partner: Not on file  . Emotionally Abused: Not on file  . Physically Abused: Not on file  .  Sexually Abused: Not on file     Review of Systems    General:  No chills, occas 'hot flashes' no fever, night sweats or weight changes.  Cardiovascular: +++ exertional chest pain and dyspnea on exertion, +++ mild LE edema, no orthopnea, palpitations, paroxysmal nocturnal dyspnea. Dermatological: No rash, lesions/masses Respiratory: No cough, +++ dyspnea Urologic: No hematuria, dysuria Abdominal:   No nausea, vomiting, diarrhea, bright red blood per rectum, melena, or hematemesis Neurologic:  No visual changes, wkns, changes in mental status. All other systems reviewed and are otherwise negative except as noted above.  Physical Exam    VS:  BP 130/78 (BP Location: Left Arm, Patient Position: Sitting, Cuff Size: Normal)   Pulse 66   Ht 6' (1.829 m)   Wt 173 lb (78.5 kg)   SpO2 96%   BMI 23.46 kg/m  , BMI Body mass index is 23.46 kg/m. GEN: Somewhat frail, in no acute distress. HEENT: normal. Neck: Supple, no JVD.  Right bruit versus radiated aortic murmur.   Cardiac: Irregularly irregular, 3/6 systolic ejection murmur heard throughout, loudest at the upper sternal borders, no rubs, or gallops. No clubbing, cyanosis.  Trace bilateral ankle edema.  Radials/PT 2+ and equal bilaterally.  Respiratory:  Respirations regular and unlabored, clear to auscultation bilaterally. GI: Soft, nontender, nondistended, BS + x 4. MS: no deformity or atrophy. Skin: warm and dry, no rash. Neuro:  Strength and sensation are intact. Psych: Normal affect.  Accessory Clinical Findings    ECG personally reviewed by me today-atrial fibrillation, 66, PVC, left axis deviation, prior inferior infarct, poor R wave progression, borderline LVH- No acute changes  Lab Results  Component Value Date   WBC 6.8 08/26/2019   HGB 8.1 Repeated and verified X2. (L)  08/26/2019   HCT 26.8 Repeated and verified X2. (L) 08/26/2019   MCV 76.4 (L) 08/26/2019   PLT 299.0 08/26/2019   Lab Results  Component Value Date   CREATININE 1.34 07/02/2019   BUN 14 07/02/2019   NA 138 07/02/2019   K 3.5 07/02/2019   CL 101 07/02/2019   CO2 31 07/02/2019   Lab Results  Component Value Date   ALT 6 07/02/2019   AST 13 07/02/2019   ALKPHOS 76 07/02/2019   BILITOT 0.8 07/02/2019   Lab Results  Component Value Date   CHOL 92 07/02/2019   HDL 39.70 07/02/2019   LDLCALC 41 07/02/2019   LDLDIRECT 164.4 10/10/2010   TRIG 58.0 07/02/2019   CHOLHDL 2 07/02/2019    Lab Results  Component Value Date   HGBA1C 6.1 (H) 05/10/2014    Assessment & Plan   1.  Unstable angina/coronary artery disease: Prior history of non-STEMI in November 2016 status post LAD stenting with residual moderate, small vessel/multivessel disease.  Over the past 2 months, he has been experiencing progressive dyspnea on exertion and exertional chest discomfort after walking about 30 yards, lasting about 5 to 10 minutes, resolving with rest.  He has not used sublingual nitrates.  He recently had an echocardiogram that showed further reduction in LV function to 35-40% and low flow, low gradient severe aortic stenosis.  His son is present with him today.  We discussed options for evaluation and we agreed to start with right and left heart cardiac catheterization to evaluate coronary anatomy.   From there, we can refer to TAVR clinic for additional evaluation.  The patient understands that risks include but are not limited to stroke (1 in 1000), death (1 in 32), kidney  failure [usually temporary] (1 in 500), bleeding (1 in 200), allergic reaction [possibly serious] (1 in 200), and agrees to proceed. He remains on beta-blocker, ARB therapy.  He will hold Eliquis for 2 days prior to catheterization.  I will obtain a CBC and basic metabolic panel today, and he will undergo Covid testing later this  afternoon.  2.  Severe aortic stenosis: Recent echo with evidence of low flow low gradient severe work stenosis with a peak gradient of only 28 mmHg but a valve area of 0.89 cm.  As above we will arrange for right and left heart catheterization to evaluate anatomy and measure pressures.  From there, we will arrange for follow-up in TAVR clinic in [place].  If necessary, can arrange for dobutamine stress echo to better document gradients.  3.  Ischemic cardiomyopathy/HFrEF: Recent echo with further drop in EF to 35-40%.  He has only trace lower extremity edema on exam, which has not changed recently.  He is otherwise not particular volume overloaded.  Continue beta-blocker and ARB therapy.  He is on Lasix 20 mg daily but uses as needed only.  I recommended that he use daily in the setting of RVSP of 50 on echo.  4.  Essential hypertension: Stable.  5.  Hyperlipidemia: LDL of 41 in March with normal LFTs at that time.  Continue high potency statin therapy.  6.  Permanent atrial fibrillation: Rate controlled on beta-blocker therapy.  He is anticoagulated with apixaban 5 mg twice daily.  This will need to be held for 2 days precath.  7.  CKD 2-3: Most recent creatinine in March was 1.34.  Follow-up lab work today.  Gentle hydration and will hold Lasix on day of cath.  8.  Disposition: CBC and basic metabolic panel today.  COVID-19 screen today.  We will arrange for diagnostic catheterization early next week.  Follow-up in clinic in approximately 3 weeks.  Referral to TAVR clinic placed.  Murray Hodgkins, NP 01/14/2020, 6:05 PM

## 2020-01-15 LAB — CBC WITH DIFFERENTIAL/PLATELET
Basophils Absolute: 0 10*3/uL (ref 0.0–0.2)
Basos: 1 %
EOS (ABSOLUTE): 0.2 10*3/uL (ref 0.0–0.4)
Eos: 3 %
Hematocrit: 28.9 % — ABNORMAL LOW (ref 37.5–51.0)
Hemoglobin: 8 g/dL — ABNORMAL LOW (ref 13.0–17.7)
Immature Grans (Abs): 0 10*3/uL (ref 0.0–0.1)
Immature Granulocytes: 0 %
Lymphocytes Absolute: 2.3 10*3/uL (ref 0.7–3.1)
Lymphs: 32 %
MCH: 21.7 pg — ABNORMAL LOW (ref 26.6–33.0)
MCHC: 27.7 g/dL — ABNORMAL LOW (ref 31.5–35.7)
MCV: 78 fL — ABNORMAL LOW (ref 79–97)
Monocytes Absolute: 0.7 10*3/uL (ref 0.1–0.9)
Monocytes: 10 %
Neutrophils Absolute: 3.9 10*3/uL (ref 1.4–7.0)
Neutrophils: 54 %
Platelets: 297 10*3/uL (ref 150–450)
RBC: 3.69 x10E6/uL — ABNORMAL LOW (ref 4.14–5.80)
RDW: 18.2 % — ABNORMAL HIGH (ref 11.6–15.4)
WBC: 7.2 10*3/uL (ref 3.4–10.8)

## 2020-01-15 LAB — BASIC METABOLIC PANEL
BUN/Creatinine Ratio: 15 (ref 10–24)
BUN: 17 mg/dL (ref 8–27)
CO2: 25 mmol/L (ref 20–29)
Calcium: 9 mg/dL (ref 8.6–10.2)
Chloride: 105 mmol/L (ref 96–106)
Creatinine, Ser: 1.16 mg/dL (ref 0.76–1.27)
GFR calc Af Amer: 66 mL/min/{1.73_m2} (ref >59)
GFR calc non Af Amer: 57 mL/min/{1.73_m2} — ABNORMAL LOW (ref >59)
Glucose: 89 mg/dL (ref 65–99)
Potassium: 3.9 mmol/L (ref 3.5–5.2)
Sodium: 140 mmol/L (ref 134–144)

## 2020-01-15 LAB — SARS CORONAVIRUS 2 (TAT 6-24 HRS): SARS Coronavirus 2: NEGATIVE

## 2020-01-17 ENCOUNTER — Other Ambulatory Visit: Payer: Self-pay | Admitting: Internal Medicine

## 2020-01-17 ENCOUNTER — Other Ambulatory Visit: Payer: Self-pay | Admitting: Nurse Practitioner

## 2020-01-17 DIAGNOSIS — D649 Anemia, unspecified: Secondary | ICD-10-CM

## 2020-01-18 ENCOUNTER — Other Ambulatory Visit: Payer: Self-pay

## 2020-01-18 ENCOUNTER — Inpatient Hospital Stay
Admission: RE | Admit: 2020-01-18 | Discharge: 2020-01-21 | DRG: 812 | Disposition: A | Discharge status: Home / Self Care | Payer: Medicare Other | Attending: Internal Medicine | Admitting: Internal Medicine

## 2020-01-18 ENCOUNTER — Inpatient Hospital Stay: Payer: Medicare Other

## 2020-01-18 ENCOUNTER — Encounter
Admission: RE | Disposition: A | Discharge status: Home / Self Care | Payer: Self-pay | Source: Home / Self Care | Attending: Internal Medicine

## 2020-01-18 ENCOUNTER — Encounter: Payer: Self-pay | Admitting: Internal Medicine

## 2020-01-18 DIAGNOSIS — K2100 Gastro-esophageal reflux disease with esophagitis, without bleeding: Secondary | ICD-10-CM | POA: Diagnosis present

## 2020-01-18 DIAGNOSIS — R0602 Shortness of breath: Secondary | ICD-10-CM

## 2020-01-18 DIAGNOSIS — I252 Old myocardial infarction: Secondary | ICD-10-CM

## 2020-01-18 DIAGNOSIS — J9811 Atelectasis: Secondary | ICD-10-CM | POA: Diagnosis not present

## 2020-01-18 DIAGNOSIS — E785 Hyperlipidemia, unspecified: Secondary | ICD-10-CM | POA: Diagnosis not present

## 2020-01-18 DIAGNOSIS — J9611 Chronic respiratory failure with hypoxia: Secondary | ICD-10-CM | POA: Diagnosis present

## 2020-01-18 DIAGNOSIS — Z8042 Family history of malignant neoplasm of prostate: Secondary | ICD-10-CM

## 2020-01-18 DIAGNOSIS — F17220 Nicotine dependence, chewing tobacco, uncomplicated: Secondary | ICD-10-CM | POA: Diagnosis present

## 2020-01-18 DIAGNOSIS — I4821 Permanent atrial fibrillation: Secondary | ICD-10-CM | POA: Diagnosis present

## 2020-01-18 DIAGNOSIS — I25118 Atherosclerotic heart disease of native coronary artery with other forms of angina pectoris: Secondary | ICD-10-CM | POA: Diagnosis present

## 2020-01-18 DIAGNOSIS — Z20822 Contact with and (suspected) exposure to covid-19: Secondary | ICD-10-CM | POA: Diagnosis present

## 2020-01-18 DIAGNOSIS — Z8546 Personal history of malignant neoplasm of prostate: Secondary | ICD-10-CM | POA: Diagnosis not present

## 2020-01-18 DIAGNOSIS — I130 Hypertensive heart and chronic kidney disease with heart failure and stage 1 through stage 4 chronic kidney disease, or unspecified chronic kidney disease: Secondary | ICD-10-CM | POA: Diagnosis present

## 2020-01-18 DIAGNOSIS — Z7901 Long term (current) use of anticoagulants: Secondary | ICD-10-CM

## 2020-01-18 DIAGNOSIS — I5022 Chronic systolic (congestive) heart failure: Secondary | ICD-10-CM | POA: Diagnosis present

## 2020-01-18 DIAGNOSIS — D649 Anemia, unspecified: Secondary | ICD-10-CM | POA: Diagnosis not present

## 2020-01-18 DIAGNOSIS — I255 Ischemic cardiomyopathy: Secondary | ICD-10-CM | POA: Diagnosis present

## 2020-01-18 DIAGNOSIS — Z8719 Personal history of other diseases of the digestive system: Secondary | ICD-10-CM

## 2020-01-18 DIAGNOSIS — M109 Gout, unspecified: Secondary | ICD-10-CM | POA: Diagnosis present

## 2020-01-18 DIAGNOSIS — R451 Restlessness and agitation: Secondary | ICD-10-CM | POA: Diagnosis not present

## 2020-01-18 DIAGNOSIS — I639 Cerebral infarction, unspecified: Secondary | ICD-10-CM

## 2020-01-18 DIAGNOSIS — Z8249 Family history of ischemic heart disease and other diseases of the circulatory system: Secondary | ICD-10-CM | POA: Diagnosis not present

## 2020-01-18 DIAGNOSIS — I272 Pulmonary hypertension, unspecified: Secondary | ICD-10-CM | POA: Diagnosis present

## 2020-01-18 DIAGNOSIS — Z955 Presence of coronary angioplasty implant and graft: Secondary | ICD-10-CM

## 2020-01-18 DIAGNOSIS — N179 Acute kidney failure, unspecified: Secondary | ICD-10-CM | POA: Diagnosis present

## 2020-01-18 DIAGNOSIS — D500 Iron deficiency anemia secondary to blood loss (chronic): Secondary | ICD-10-CM | POA: Diagnosis present

## 2020-01-18 DIAGNOSIS — I350 Nonrheumatic aortic (valve) stenosis: Secondary | ICD-10-CM | POA: Diagnosis present

## 2020-01-18 DIAGNOSIS — Z66 Do not resuscitate: Secondary | ICD-10-CM | POA: Diagnosis present

## 2020-01-18 DIAGNOSIS — Z8673 Personal history of transient ischemic attack (TIA), and cerebral infarction without residual deficits: Secondary | ICD-10-CM

## 2020-01-18 DIAGNOSIS — N1831 Chronic kidney disease, stage 3a: Secondary | ICD-10-CM | POA: Diagnosis not present

## 2020-01-18 DIAGNOSIS — I10 Essential (primary) hypertension: Secondary | ICD-10-CM | POA: Diagnosis not present

## 2020-01-18 DIAGNOSIS — R410 Disorientation, unspecified: Secondary | ICD-10-CM | POA: Diagnosis not present

## 2020-01-18 DIAGNOSIS — Z79899 Other long term (current) drug therapy: Secondary | ICD-10-CM

## 2020-01-18 DIAGNOSIS — K219 Gastro-esophageal reflux disease without esophagitis: Secondary | ICD-10-CM | POA: Diagnosis present

## 2020-01-18 DIAGNOSIS — I480 Paroxysmal atrial fibrillation: Secondary | ICD-10-CM | POA: Diagnosis present

## 2020-01-18 DIAGNOSIS — D509 Iron deficiency anemia, unspecified: Secondary | ICD-10-CM | POA: Diagnosis present

## 2020-01-18 DIAGNOSIS — N183 Chronic kidney disease, stage 3 unspecified: Secondary | ICD-10-CM | POA: Diagnosis present

## 2020-01-18 LAB — PROTIME-INR
INR: 1.1 (ref 0.8–1.2)
Prothrombin Time: 13.6 seconds (ref 11.4–15.2)

## 2020-01-18 LAB — CBC
HCT: 25.7 % — ABNORMAL LOW (ref 39.0–52.0)
Hemoglobin: 7.4 g/dL — ABNORMAL LOW (ref 13.0–17.0)
MCH: 22 pg — ABNORMAL LOW (ref 26.0–34.0)
MCHC: 28.8 g/dL — ABNORMAL LOW (ref 30.0–36.0)
MCV: 76.3 fL — ABNORMAL LOW (ref 80.0–100.0)
Platelets: 251 10*3/uL (ref 150–400)
RBC: 3.37 MIL/uL — ABNORMAL LOW (ref 4.22–5.81)
RDW: 20.8 % — ABNORMAL HIGH (ref 11.5–15.5)
WBC: 6.7 10*3/uL (ref 4.0–10.5)
nRBC: 0 % (ref 0.0–0.2)

## 2020-01-18 LAB — BASIC METABOLIC PANEL
Anion gap: 6 (ref 5–15)
BUN: 14 mg/dL (ref 8–23)
CO2: 26 mmol/L (ref 22–32)
Calcium: 8.8 mg/dL — ABNORMAL LOW (ref 8.9–10.3)
Chloride: 106 mmol/L (ref 98–111)
Creatinine, Ser: 1.29 mg/dL — ABNORMAL HIGH (ref 0.61–1.24)
GFR calc Af Amer: 58 mL/min — ABNORMAL LOW (ref >60)
GFR calc non Af Amer: 50 mL/min — ABNORMAL LOW (ref >60)
Glucose, Bld: 119 mg/dL — ABNORMAL HIGH (ref 70–99)
Potassium: 3.2 mmol/L — ABNORMAL LOW (ref 3.5–5.1)
Sodium: 138 mmol/L (ref 135–145)

## 2020-01-18 LAB — RESPIRATORY PANEL BY RT PCR (FLU A&B, COVID)
Influenza A by PCR: NEGATIVE
Influenza B by PCR: NEGATIVE
SARS Coronavirus 2 by RT PCR: NEGATIVE

## 2020-01-18 LAB — RETICULOCYTES
Immature Retic Fract: 18.3 % — ABNORMAL HIGH (ref 2.3–15.9)
RBC.: 3.46 MIL/uL — ABNORMAL LOW (ref 4.22–5.81)
Retic Count, Absolute: 50.2 10*3/uL (ref 19.0–186.0)
Retic Ct Pct: 1.5 % (ref 0.4–3.1)

## 2020-01-18 LAB — FOLATE: Folate: 21 ng/mL (ref >5.9)

## 2020-01-18 LAB — APTT: aPTT: 32 seconds (ref 24–36)

## 2020-01-18 LAB — IRON AND TIBC
Iron: 21 ug/dL — ABNORMAL LOW (ref 45–182)
Saturation Ratios: 6 % — ABNORMAL LOW (ref 17.9–39.5)
TIBC: 363 ug/dL (ref 250–450)
UIBC: 342 ug/dL

## 2020-01-18 LAB — VITAMIN B12: Vitamin B-12: 810 pg/mL (ref 180–914)

## 2020-01-18 LAB — FERRITIN: Ferritin: 58 ng/mL (ref 24–336)

## 2020-01-18 LAB — PREPARE RBC (CROSSMATCH)

## 2020-01-18 LAB — BRAIN NATRIURETIC PEPTIDE: B Natriuretic Peptide: 703.8 pg/mL — ABNORMAL HIGH (ref 0.0–100.0)

## 2020-01-18 SURGERY — LEFT HEART CATH AND CORONARY ANGIOGRAPHY
Anesthesia: Moderate Sedation | Laterality: Left

## 2020-01-18 MED ORDER — DM-GUAIFENESIN ER 30-600 MG PO TB12
1.0000 | ORAL_TABLET | Freq: Two times a day (BID) | ORAL | Status: DC | PRN
  Filled 2020-01-18: qty 1

## 2020-01-18 MED ORDER — ACETAMINOPHEN 325 MG PO TABS: 650.0000 mg | ORAL_TABLET | Freq: Four times a day (QID) | ORAL | Status: DC | PRN

## 2020-01-18 MED ORDER — FUROSEMIDE 10 MG/ML IJ SOLN: 20.0000 mg | Freq: Two times a day (BID) | INTRAMUSCULAR | Status: DC

## 2020-01-18 MED ORDER — POTASSIUM CHLORIDE CRYS ER 20 MEQ PO TBCR
40.0000 meq | EXTENDED_RELEASE_TABLET | Freq: Once | ORAL | Status: AC
  Administered 2020-01-18: 40 meq via ORAL
  Filled 2020-01-18: qty 2

## 2020-01-18 MED ORDER — ONDANSETRON HCL 4 MG/2ML IJ SOLN
4.0000 mg | Freq: Three times a day (TID) | INTRAMUSCULAR | Status: DC | PRN
  Administered 2020-01-19: 4 mg via INTRAVENOUS
  Filled 2020-01-18: qty 2

## 2020-01-18 MED ORDER — SODIUM CHLORIDE 0.9% FLUSH: 3.0000 mL | INTRAVENOUS | Status: DC | PRN

## 2020-01-18 MED ORDER — TRAMADOL HCL 50 MG PO TABS: 50.0000 mg | ORAL_TABLET | Freq: Three times a day (TID) | ORAL | Status: DC | PRN

## 2020-01-18 MED ORDER — LOSARTAN POTASSIUM 50 MG PO TABS
50.0000 mg | ORAL_TABLET | Freq: Every day | ORAL | Status: DC
  Administered 2020-01-18 – 2020-01-19 (×2): 50 mg via ORAL
  Filled 2020-01-18 (×2): qty 1

## 2020-01-18 MED ORDER — SODIUM CHLORIDE 0.9% IV SOLUTION: Freq: Once | INTRAVENOUS | Status: AC

## 2020-01-18 MED ORDER — SODIUM CHLORIDE 0.9% FLUSH: 3.0000 mL | Freq: Two times a day (BID) | INTRAVENOUS | Status: DC

## 2020-01-18 MED ORDER — SODIUM CHLORIDE 0.9 % IV SOLN: INTRAVENOUS | Status: DC

## 2020-01-18 MED ORDER — LORAZEPAM 2 MG/ML IJ SOLN
1.0000 mg | Freq: Once | INTRAMUSCULAR | Status: AC
  Administered 2020-01-18: 1 mg via INTRAVENOUS
  Filled 2020-01-18: qty 1

## 2020-01-18 MED ORDER — FUROSEMIDE 10 MG/ML IJ SOLN
20.0000 mg | Freq: Two times a day (BID) | INTRAMUSCULAR | Status: DC
  Administered 2020-01-18 – 2020-01-19 (×3): 20 mg via INTRAVENOUS
  Filled 2020-01-18 (×3): qty 2

## 2020-01-18 MED ORDER — SODIUM CHLORIDE 0.9 % IV SOLN: 250.0000 mL | INTRAVENOUS | Status: DC | PRN

## 2020-01-18 MED ORDER — PANTOPRAZOLE SODIUM 40 MG PO TBEC
40.0000 mg | DELAYED_RELEASE_TABLET | Freq: Every day | ORAL | Status: DC
  Administered 2020-01-18 – 2020-01-21 (×3): 40 mg via ORAL
  Filled 2020-01-18 (×3): qty 1

## 2020-01-18 MED ORDER — COLCHICINE 0.6 MG PO TABS
0.6000 mg | ORAL_TABLET | Freq: Two times a day (BID) | ORAL | Status: DC | PRN
  Filled 2020-01-18: qty 1

## 2020-01-18 MED ORDER — HYDRALAZINE HCL 20 MG/ML IJ SOLN: 5.0000 mg | INTRAMUSCULAR | Status: DC | PRN

## 2020-01-18 MED ORDER — FUROSEMIDE 10 MG/ML IJ SOLN
INTRAMUSCULAR | Status: AC
  Administered 2020-01-18: 20 mg via INTRAVENOUS
  Filled 2020-01-18: qty 2

## 2020-01-18 MED ORDER — ASPIRIN 81 MG PO CHEW: 81.0000 mg | CHEWABLE_TABLET | ORAL | Status: DC

## 2020-01-18 MED ORDER — INFLUENZA VAC A&B SA ADJ QUAD 0.5 ML IM PRSY
0.5000 mL | PREFILLED_SYRINGE | INTRAMUSCULAR | Status: DC
  Filled 2020-01-18 (×2): qty 0.5

## 2020-01-18 MED ORDER — OXYMETAZOLINE HCL 0.05 % NA SOLN
1.0000 | Freq: Every day | NASAL | Status: DC | PRN
  Filled 2020-01-18: qty 15

## 2020-01-18 MED ORDER — ALBUTEROL SULFATE HFA 108 (90 BASE) MCG/ACT IN AERS
2.0000 | INHALATION_SPRAY | RESPIRATORY_TRACT | Status: DC | PRN
  Filled 2020-01-18: qty 6.7

## 2020-01-18 MED ORDER — CARVEDILOL 3.125 MG PO TABS
3.1250 mg | ORAL_TABLET | Freq: Two times a day (BID) | ORAL | Status: DC
  Administered 2020-01-18 – 2020-01-21 (×7): 3.125 mg via ORAL
  Filled 2020-01-18 (×7): qty 1

## 2020-01-18 MED ORDER — ATORVASTATIN CALCIUM 80 MG PO TABS
80.0000 mg | ORAL_TABLET | Freq: Every day | ORAL | Status: DC
  Administered 2020-01-18 – 2020-01-20 (×3): 80 mg via ORAL
  Filled 2020-01-18 (×3): qty 1

## 2020-01-18 MED ORDER — DICLOFENAC SODIUM 1 % EX GEL
2.0000 g | Freq: Three times a day (TID) | CUTANEOUS | Status: DC | PRN
  Filled 2020-01-18: qty 100

## 2020-01-18 MED ORDER — FUROSEMIDE 20 MG PO TABS: 20.0000 mg | ORAL_TABLET | Freq: Every day | ORAL | Status: DC

## 2020-01-18 MED ORDER — FUROSEMIDE 10 MG/ML IJ SOLN: 20.0000 mg | Freq: Once | INTRAMUSCULAR | Status: DC

## 2020-01-18 MED ORDER — CARBOXYMETHYLCELLUL-GLYCERIN 0.5-0.9 % OP SOLN
2.0000 [drp] | Freq: Every day | OPHTHALMIC | Status: DC | PRN
  Filled 2020-01-18: qty 15

## 2020-01-18 NOTE — Consult Note (Signed)
Cardiology Consultation:   Patient ID: Kyle [name] MRN: [id number]; DOB: 11-06-1931  Admit date: 01/18/2020 Date of Consult: 01/18/2020  Primary Care Provider: Venia Carbon, MD [facility] Cardiologist: Kyle Rogue, MD [facility] Electrophysiologist:  None    Patient Profile:   Kyle [name] is a 86 y.o. male with a hx of severe aortic stenosis, chronic HFrEF, coronary artery disease s/p PCI, hypertension, hyperlipidemia, permanent atrial fibrillation, prostate cancer, peptic ulcer disease, progressive iron deficiency anemia, and chronic kidney dsease who is being seen today for the evaluation of shortness of breath and anemia at the request of Kyle [name].  History of Present Illness:   Kyle [name] reports progressive shortness of breath for at least 3 months.  He was seen by Kyle Bayley, NP, last week in the [facility] office for evaluation of dyspnea as well as preceding echocardiogram that showed moderately reduced LVEF (35-40%) and severe aortic stenosis.  Arrangements were made for right and left heart catheterization today in anticipation of aortic valve intervention.  However, precatheterization labs revealed significant anemia, which has gradually worsened over the last several months.  Repeat hemoglobin this morning had fallen further to 7.6.  In light of this, catheterization was cancelled and arrangements made for admission to [facility] for PRBC transfusion and optimization of volume status.  Kyle [name] reports dyspnea with modest activity.  He denies chest pain, palpitations, lightheadedness/syncope, or edema.  He has not noticed any bleeding.  He reports being compliant with his medications.   Past Medical History:  Diagnosis Date  . Arthritis   . CAD (coronary artery disease)    a. 02/2015 NSTEMI/PCI: LM nl, LAd 60m (2.5x16 Synergy DES), D1 50ost, D2 50ost, RI 25ost, LCX 25ost/p, 75d (small), LPDA 90 (small), RCA 80p (small).  . Cancer [facility])    prostate CA  .  Claustrophobia    "EXTREMELY" (03/02/2015)  . Detached retina    right  . GERD (gastroesophageal reflux disease)   . H/O dizziness   . HFrEF (heart failure with reduced ejection fraction) ([place])    a. 02/2015 Echo: EF 15-20%; b. 05/2015 Echo: EF 35-40%; c. 06/2018 Echo: EF 40-45%, DD, Nl RV fxn. RVSP 37.60mmHg. Mild LAE. Mild AS/AI; d. 12/2019 Echo: EF 35-40%, mod LVH, Gr2 DD, RVSP 50, mild LAE, mild MR, low flow, low grad Sev AS (peak grad 7mmHg, AVA 0.89cm2). Mild AI. Asc Ao 32mm.  [name] History of prostate cancer   . Hyperlipidemia   . Hypertension   . Impaired fasting glucose   . Ischemic cardiomyopathy    a. 02/2015 Echo: EF 15-20%; b. 05/2015 Echo: EF 35-40%; c. 06/2018 Echo: EF 40-45%; d. 12/2019 Echo: EF 35-40%.  [name] PAF (paroxysmal atrial fibrillation) ([place])    a. Rate controlled. CHA2DS2VASc = 7--> Eliquis.  . Severe aortic stenosis    a. 12/2019 Echo: low flow, low grad Sev AS (peak grad 48mmHg, AVA 0.89cm2).  . Stroke [facility]) ~ 1995-02/2009 X 2   a. 02/2008 MRI brain: prior infarct involving R lbe of cerebellum w/ encephalomalcic change.  . Urgency of urination     Past Surgical History:  Procedure Laterality Date  . CARDIAC CATHETERIZATION  ~ 1995   Archie Endo 08/22/2010  . CARDIAC CATHETERIZATION N/A 03/02/2015   Procedure: Left Heart Cath and Coronary Angiography;  Surgeon: Jettie Booze, MD;  Location: [place] CV LAB;  Service: Cardiovascular;  Laterality: N/A;  . CARDIAC CATHETERIZATION  03/02/2015   Procedure: Coronary Stent Intervention;  Surgeon: Jettie Booze, MD;  Location: [place] CV LAB;  Service: Cardiovascular;;  . COLONOSCOPY W/ POLYPECTOMY    . COLONOSCOPY WITH PROPOFOL N/A 09/13/2019   Procedure: COLONOSCOPY WITH PROPOFOL;  Surgeon: Jonathon Bellows, MD;  Location: [facility] ENDOSCOPY;  Service: Gastroenterology;  Laterality: N/A;  . ESOPHAGOGASTRODUODENOSCOPY (EGD) WITH PROPOFOL N/A 01/31/2017   Procedure: ESOPHAGOGASTRODUODENOSCOPY (EGD) WITH PROPOFOL;  Surgeon:  Lucilla Lame, MD;  Location: [facility] ENDOSCOPY;  Service: Endoscopy;  Laterality: N/A;  . ESOPHAGOGASTRODUODENOSCOPY (EGD) WITH PROPOFOL N/A 09/13/2019   Procedure: ESOPHAGOGASTRODUODENOSCOPY (EGD) WITH PROPOFOL;  Surgeon: Jonathon Bellows, MD;  Location: [facility] ENDOSCOPY;  Service: Gastroenterology;  Laterality: N/A;  . EYE SURGERY Right   . HERNIA REPAIR     hx UHR/notes 08/22/2010  . INGUINAL HERNIA REPAIR Left    Archie Endo 08/22/2010  . LUMBAR LAMINECTOMY/DECOMPRESSION MICRODISCECTOMY  01/24/2012   Procedure: LUMBAR LAMINECTOMY/DECOMPRESSION MICRODISCECTOMY 1 LEVEL;  Surgeon: Winfield Cunas, MD;  Location: [place] NEURO ORS;  Service: Neurosurgery;  Laterality: Right;  RIGHT Lumbar Three-Four far lateral diskectomy  . [place]   Archie Endo 08/22/2010  . RETINAL DETACHMENT REPAIR W/ SCLERAL BUCKLE LE Right 08/2006   Archie Endo 08/22/2010  . TONSILLECTOMY    . UMBILICAL HERNIA REPAIR     hx/notes 08/22/2010     Home Medications:  Prior to Admission medications   Medication Sig Start Date [name] Date Taking? Authorizing Provider  acetaminophen (TYLENOL) 325 MG tablet Take 325-650 mg by mouth every 6 (six) hours as needed for moderate pain.   Yes [provider]  atorvastatin (LIPITOR) 80 MG tablet TAKE 1 TABLET BY MOUTH EVERY DAY AT 6PM Patient taking differently: Take 80 mg by mouth daily at 6 PM.  10/04/19  Yes Gollan, Kathlene November, MD  Carboxymethylcellul-Glycerin (LUBRICATING EYE DROPS OP) Place 1 drop into both eyes daily as needed (dry/itchy eyes).   Yes [provider]  carvedilol (COREG) 3.125 MG tablet Take 1 tablet (3.125 mg total) by mouth 2 (two) times daily. 11/30/19  Yes Hochrein, [name], MD  COLCRYS 0.6 MG tablet TAKE ONE TABLET TWICE DAILY AS NEEDED FOR JOINT SWELLING Patient taking differently: Take 0.6 mg by mouth 2 (two) times daily as needed (joint swelling).  09/17/19  Yes Kyle Carbon, MD  Diclofenac Sodium (ASPERCREME ARTHRITIS PAIN EX) Apply 1 application topically daily as  needed (pain).   Yes [provider]  ELIQUIS 5 MG TABS tablet TAKE ONE TABLET BY MOUTH TWICE DAILY Patient taking differently: Take 5 mg by mouth 2 (two) times daily.  12/13/19  Yes Gollan, Kathlene November, MD  furosemide (LASIX) 20 MG tablet TAKE 1 TABLET BY MOUTH DAILY Patient taking differently: Take 20 mg by mouth daily.  07/27/19  Yes Minus Breeding, MD  losartan (COZAAR) 50 MG tablet TAKE 1 TABLET BY MOUTH EVERY DAY Patient taking differently: Take 50 mg by mouth daily.  10/04/19  Yes Gollan, Kathlene November, MD  oxymetazoline (AFRIN) 0.05 % nasal spray Place 1 spray into both nostrils daily as needed (nose bleeding).   Yes [provider]  pantoprazole (PROTONIX) 40 MG tablet Take 1 tablet (40 mg total) by mouth daily. Please decrease dose to once a day. Patient taking differently: Take 40 mg by mouth daily.  11/05/19  Yes [name], NP  traMADol (ULTRAM) 50 MG tablet TAKE 1 TABLET BY MOUTH THREE TIMES DAILY AS NEEDED Patient taking differently: Take 50 mg by mouth 3 (three) times daily as needed for severe pain.  12/29/19  Yes Kyle Carbon, MD  Inpatient Medications: Scheduled Meds: . atorvastatin  80 mg Oral q1800  . carvedilol  3.125 mg Oral BID  . furosemide  20 mg Intravenous BID  . losartan  50 mg Oral Daily  . pantoprazole  40 mg Oral Daily   Continuous Infusions:  PRN Meds: acetaminophen, albuterol, carboxymethylcellul-glycerin, colchicine, dextromethorphan-guaiFENesin, diclofenac Sodium, hydrALAZINE, ondansetron (ZOFRAN) IV, oxymetazoline, traMADol  Allergies:    Allergies  Allergen Reactions  . Ivp Dye [Iodinated Diagnostic Agents]     Disorientation   . Other     General anesthesia - for about a day after causes confusion   . Simvastatin     myalgias    Social History:   Social History   Tobacco Use  . Smoking status: Never Smoker  . Smokeless tobacco: Former Systems developer    Types: Secondary school teacher  . Vaping Use: Former  Substance Use Topics    . Alcohol use: No  . Drug use: No    Family History:   Family History  Problem Relation Age of Onset  . Heart disease Mother   . Coronary artery disease Mother   . Heart disease Father   . Coronary artery disease Father   . Heart disease Brother   . Heart disease Brother   . Cancer Brother        prostate  . Diabetes Neg Hx      ROS:  Please see the history of present illness. All other ROS reviewed and negative.     Physical Exam/Data:   Vitals:   01/18/20 1233 01/18/20 1341 01/18/20 1503 01/18/20 1543  BP: (!) 166/86 (!) 175/94 140/82 133/72  Pulse: 73 84 93 80  Resp: (!) 23 20 20 16   Temp:  97.8 F (36.6 C) 98.8 F (37.1 C) 98.4 F (36.9 C)  TempSrc:  Oral Oral Oral  SpO2: 95% 100% 94% 100%  Weight:  76.9 kg    Height:  6\' 2"  (1.88 m)      Intake/Output Summary (Last 24 hours) at 01/18/2020 1623 Last data filed at 01/18/2020 1542 Gross per 24 hour  Intake 20 ml  Output 1100 ml  Net -1080 ml   Last 3 Weights 01/18/2020 01/18/2020 01/14/2020  Weight (lbs) 169 lb 8 oz 173 lb 173 lb  Weight (kg) 76.885 kg 78.472 kg 78.472 kg     Body mass index is 21.76 kg/m.  General:  Pale appearing, elderly man, in no acute distress.  He is accompanied by his son HEENT: normal Lymph: no adenopathy Neck: no JVD Endocrine:  No thryomegaly Vascular: No carotid bruits; FA pulses 2+ bilaterally without bruits  Cardiac:  Irregularly irregular with 2/6 systolic murmur. Lungs:  clear to auscultation bilaterally, no wheezing, rhonchi or rales  Abd: soft, nontender, no hepatomegaly  Ext: no edema Musculoskeletal:  No deformities, BUE and BLE strength normal and equal Skin: warm and dry  Neuro:  CNs 2-12 intact, no focal abnormalities noted Psych:  Normal affect   EKG:  The EKG was personally reviewed and demonstrates:  Atrial fibrillation with PVC's versus aberrancy, LVH with abnormal repolarization, and poor R wave progression.  Relevant CV Studies: Echocardiogram  (01/12/2020): 1. Left ventricular ejection fraction, by estimation, is 35 to 40%. The  left ventricle has moderately decreased function. The left ventricle  demonstrates global hypokinesis. There is moderate concentric left  ventricular hypertrophy. Left ventricular  diastolic parameters are consistent with Grade II diastolic dysfunction  (pseudonormalization).  2. RVSP 25mmHg plus RA pressure. Atleast moderate pulmonary  hypertension.  Right ventricular systolic function is low normal. The right ventricular  size is moderately enlarged. Mildly increased right ventricular wall  thickness. There is moderately  elevated pulmonary artery systolic pressure.  3. Left atrial size was mildly dilated.  4. The mitral valve is normal in structure. Mild mitral valve  regurgitation.  5. Tricuspid valve regurgitation is moderate.  6. Patient has Low flow, low gradient severe aortic stenosis (PG 45mmHg,  Mean Gradient 63mmHg, DVI 0.21, AVA 0.89cm2). The aortic valve is  tricuspid. There is severe calcifcation of the aortic valve. Aortic valve  regurgitation is mild. Severe aortic  valve stenosis.  7. Aortic dilatation noted. There is mild dilatation of the ascending  aorta, measuring 42 mm.   Laboratory Data:  High Sensitivity Troponin:  No results for input(s): TROPONINIHS in the last 720 hours.   Chemistry Recent Labs  Lab 01/14/20 1440 01/18/20 0730  NA 140 138  K 3.9 3.2*  CL 105 106  CO2 25 26  GLUCOSE 89 119*  BUN 17 14  CREATININE 1.16 1.29*  CALCIUM 9.0 8.8*  GFRNONAA 57* 50*  GFRAA 66 58*  ANIONGAP  --  6    No results for input(s): PROT, ALBUMIN, AST, ALT, ALKPHOS, BILITOT in the last 168 hours. Hematology Recent Labs  Lab 01/14/20 1440 01/18/20 0729 01/18/20 0730  WBC 7.2 6.7  --   RBC 3.69* 3.37* 3.46*  HGB 8.0* 7.4*  --   HCT 28.9* 25.7*  --   MCV 78* 76.3*  --   MCH 21.7* 22.0*  --   MCHC 27.7* 28.8*  --   RDW 18.2* 20.8*  --   PLT 297 251  --     BNP Recent Labs  Lab 01/18/20 0730  BNP 703.8*    DDimer No results for input(s): DDIMER in the last 168 hours.  Assessment and Plan:   Symptomatic anemia: Progressive exertional dyspnea is likely multifactorial, though worsening iron deficiency anemia is certainly a concern.  I agree with PRBC transfusion for target hemoglobin > 8.  Upper and lower endoscopies in May did not reveal obvious source of blood loss, though multiple colon polyps were removed.  Question if this could represent Heyde syndrome.  Transfuse PRBC for targed hemoglobin > 8.  Hold apixaban.  Chronic HFrEF and severe aortic stenosis: LVEF 35-40% on recent echo.  Patient appears grossly euvolemic on exam, though I suspect that reduced LVEF and severe AS give him a narrow window in regard to his volume status.  He is at risk for TACO (transfusion associated circulatory overload) with PRBC transfusion.  Maintain net even to slightly negative fluid balance.  I agree with furosemide 20 mg IV BID; further escalation may be needed based on respiratory status and urine output.  [facility] once hemoglobin has improved and stabilized (likely Thursday).  Outpatient consultation with TAVR scheduled for 01/24/2020.  Continue carvedilol 3.125 mg BID and losartan 50 mg daily.  Coronary artery disease: Question if worsening DOE is an anginal equivalent.  PRBC transfusion, as above.  Plan for catheterization when hemoglobin improved/stable.  Continue atorvastatin.  Defer aspirin/anticoagulation pending hemoglobin trend.  Permanent atrial fibrillation: Ventricular rates reasonably well-controlled.  Continue carvedilol 3.125 mg BID.  Hold anticoagulation in the setting of severe anemia and plans for catheterization this admission.    For questions or updates, please contact [place] Please consult [email or web address] for contact info under [facility].  Signed, Nelva Bush, MD  01/18/2020 4:23 PM

## 2020-01-18 NOTE — H&P (Addendum)
History and Physical    Kyle [name] EFE:[id number] DOB: [birth year] DOA: 01/18/2020  Referring MD/NP/PA:   PCP: Venia Carbon, MD   Patient coming from:  The patient is coming from home.  At baseline, pt is partially dependent for most of ADL.        Chief Complaint: SOB  HPI: Kyle [name] is a 86 y.o. male with medical history significant of aortic stenosis, hypertension, hyperlipidemia, stroke, GERD, PAF on Eliquis, prostate cancer, sCHF, CAD, DES stent placement, gastric ulcer disease, anemia, CKD stage III, gout, who presents with shortness breath.  Patient has severe aortic stenosis.  Patient is scheduled for cardiac cath due to aortic stenosis by cardiologist, Dr. Saunders Revel. The procedure is postponed since patient has worsening shortness of breath.  Patient states that he has been short of breath, no cough, chest pain, fever or chills.  Currently patient does not have nausea, vomiting, diarrhea or abdominal pain.  Denies dark stool or bloody stool recently.  No symptoms of UTI or unilateral weakness.  Patient has chronic anemia.  His hemoglobin has gradually been trending down. Patient had EGD and colonoscopy on 09/13/2019 by Dr. Vicente Males of GI. Pt was had single gastric polyp which was clipped.  Patient had multiple colon polyps which were removed.  Lab: pt was found to have worsening anemia (hemoglobin 11.1 on 06/30/2018 --> 8.8 on 07/02/2019 --> 8.0 on 01/14/2020 --> 7.4 today), pending BMP, patient had a negative Covid PCR 9/24, pending repeated Covid PCR today, temperature normal, blood pressure 169/91, heart rate 82, oxygen saturation 97.7% on room air, RR 22.  Patient is admitted to progressive bed as inpatient.  Review of Systems:   General: no fevers, chills, no body weight gain, has fatigue HEENT: no blurry vision, hearing changes or sore throat Respiratory: has dyspnea, no coughing, wheezing CV: no chest pain, no palpitations GI: no nausea, vomiting, abdominal pain, diarrhea,  constipation GU: no dysuria, burning on urination, increased urinary frequency, hematuria  Ext: has leg edema Neuro: no unilateral weakness, numbness, or tingling, no vision change or hearing loss Skin: no rash, no skin tear. MSK: No muscle spasm, no deformity, no limitation of range of movement in spin Heme: No easy bruising.  Travel history: No recent long distant travel.  Allergy:  Allergies  Allergen Reactions  . Ivp Dye [Iodinated Diagnostic Agents]     Disorientation   . Other     General anesthesia - for about a day after causes confusion   . Simvastatin     myalgias    Past Medical History:  Diagnosis Date  . Arthritis   . CAD (coronary artery disease)    a. 02/2015 NSTEMI/PCI: LM nl, LAd 64m (2.5x16 Synergy DES), D1 50ost, D2 50ost, RI 25ost, LCX 25ost/p, 75d (small), LPDA 90 (small), RCA 80p (small).  . Cancer [facility])    prostate CA  . Claustrophobia    "EXTREMELY" (03/02/2015)  . Detached retina    right  . GERD (gastroesophageal reflux disease)   . H/O dizziness   . HFrEF (heart failure with reduced ejection fraction) ([place])    a. 02/2015 Echo: EF 15-20%; b. 05/2015 Echo: EF 35-40%; c. 06/2018 Echo: EF 40-45%, DD, Nl RV fxn. RVSP 37.77mmHg. Mild LAE. Mild AS/AI; d. 12/2019 Echo: EF 35-40%, mod LVH, Gr2 DD, RVSP 50, mild LAE, mild MR, low flow, low grad Sev AS (peak grad 44mmHg, AVA 0.89cm2). Mild AI. Asc Ao 36mm.  [name] History of prostate cancer   . Hyperlipidemia   .  Hypertension   . Impaired fasting glucose   . Ischemic cardiomyopathy    a. 02/2015 Echo: EF 15-20%; b. 05/2015 Echo: EF 35-40%; c. 06/2018 Echo: EF 40-45%; d. 12/2019 Echo: EF 35-40%.  [name] PAF (paroxysmal atrial fibrillation) ([place])    a. Rate controlled. CHA2DS2VASc = 7--> Eliquis.  . Severe aortic stenosis    a. 12/2019 Echo: low flow, low grad Sev AS (peak grad 53mmHg, AVA 0.89cm2).  . Stroke [facility]) ~ 1995-02/2009 X 2   a. 02/2008 MRI brain: prior infarct involving R lbe of cerebellum w/ encephalomalcic  change.  . Urgency of urination     Past Surgical History:  Procedure Laterality Date  . CARDIAC CATHETERIZATION  ~ 1995   Archie Endo 08/22/2010  . CARDIAC CATHETERIZATION N/A 03/02/2015   Procedure: Left Heart Cath and Coronary Angiography;  Surgeon: Jettie Booze, MD;  Location: [place] CV LAB;  Service: Cardiovascular;  Laterality: N/A;  . CARDIAC CATHETERIZATION  03/02/2015   Procedure: Coronary Stent Intervention;  Surgeon: Jettie Booze, MD;  Location: [place] CV LAB;  Service: Cardiovascular;;  . COLONOSCOPY W/ POLYPECTOMY    . COLONOSCOPY WITH PROPOFOL N/A 09/13/2019   Procedure: COLONOSCOPY WITH PROPOFOL;  Surgeon: Jonathon Bellows, MD;  Location: [facility] ENDOSCOPY;  Service: Gastroenterology;  Laterality: N/A;  . ESOPHAGOGASTRODUODENOSCOPY (EGD) WITH PROPOFOL N/A 01/31/2017   Procedure: ESOPHAGOGASTRODUODENOSCOPY (EGD) WITH PROPOFOL;  Surgeon: Lucilla Lame, MD;  Location: [facility] ENDOSCOPY;  Service: Endoscopy;  Laterality: N/A;  . ESOPHAGOGASTRODUODENOSCOPY (EGD) WITH PROPOFOL N/A 09/13/2019   Procedure: ESOPHAGOGASTRODUODENOSCOPY (EGD) WITH PROPOFOL;  Surgeon: Jonathon Bellows, MD;  Location: [facility]COPY;  Service: Gastroenterology;  Laterality: N/A;  . EYE SURGERY Right   . HERNIA REPAIR     hx UHR/notes 08/22/2010  . INGUINAL HERNIA REPAIR Left    Archie Endo 08/22/2010  . LUMBAR LAMINECTOMY/DECOMPRESSION MICRODISCECTOMY  01/24/2012   Procedure: LUMBAR LAMINECTOMY/DECOMPRESSION MICRODISCECTOMY 1 LEVEL;  Surgeon: Winfield Cunas, MD;  Location: [place] NEURO ORS;  Service: Neurosurgery;  Laterality: Right;  RIGHT Lumbar Three-Four far lateral diskectomy  . [place]   Archie Endo 08/22/2010  . RETINAL DETACHMENT REPAIR W/ SCLERAL BUCKLE LE Right 08/2006   Archie Endo 08/22/2010  . TONSILLECTOMY    . UMBILICAL HERNIA REPAIR     hx/notes 08/22/2010    Social History:  reports that he has never smoked. He quit smokeless tobacco use about 15 years ago.  His smokeless tobacco use included chew.  He reports that he does not drink alcohol and does not use drugs.  Family History:  Family History  Problem Relation Age of Onset  . Heart disease Mother   . Coronary artery disease Mother   . Heart disease Father   . Coronary artery disease Father   . Heart disease Brother   . Heart disease Brother   . Cancer Brother        prostate  . Diabetes Neg Hx      Prior to Admission medications   Medication Sig Start Date End Date Taking? Authorizing Provider  acetaminophen (TYLENOL) 325 MG tablet Take 325-650 mg by mouth every 6 (six) hours as needed for moderate pain.   Yes [provider]  atorvastatin (LIPITOR) 80 MG tablet TAKE 1 TABLET BY MOUTH EVERY DAY AT 6PM Patient taking differently: Take 80 mg by mouth daily at 6 PM.  10/04/19  Yes Gollan, Kathlene November, MD  Carboxymethylcellul-Glycerin (LUBRICATING EYE DROPS OP) Place 1 drop into both eyes daily as needed (dry/itchy eyes).   Yes [provider]  carvedilol (COREG) 3.125 MG tablet Take 1 tablet (3.125 mg total) by mouth 2 (two) times daily. 11/30/19  Yes Hochrein, [name], MD  COLCRYS 0.6 MG tablet TAKE ONE TABLET TWICE DAILY AS NEEDED FOR JOINT SWELLING Patient taking differently: Take 0.6 mg by mouth 2 (two) times daily as needed (joint swelling).  09/17/19  Yes Venia Carbon, MD  Diclofenac Sodium (ASPERCREME ARTHRITIS PAIN EX) Apply 1 application topically daily as needed (pain).   Yes [provider]  ELIQUIS 5 MG TABS tablet TAKE ONE TABLET BY MOUTH TWICE DAILY Patient taking differently: Take 5 mg by mouth 2 (two) times daily.  12/13/19  Yes Gollan, Kathlene November, MD  furosemide (LASIX) 20 MG tablet TAKE 1 TABLET BY MOUTH DAILY Patient taking differently: Take 20 mg by mouth daily.  07/27/19  Yes Minus Breeding, MD  losartan (COZAAR) 50 MG tablet TAKE 1 TABLET BY MOUTH EVERY DAY Patient taking differently: Take 50 mg by mouth daily.  10/04/19  Yes Gollan, Kathlene November, MD  oxymetazoline (AFRIN) 0.05 % nasal  spray Place 1 spray into both nostrils daily as needed (nose bleeding).   Yes [provider]  pantoprazole (PROTONIX) 40 MG tablet Take 1 tablet (40 mg total) by mouth daily. Please decrease dose to once a day. Patient taking differently: Take 40 mg by mouth daily.  11/05/19  Yes [name], NP  traMADol (ULTRAM) 50 MG tablet TAKE 1 TABLET BY MOUTH THREE TIMES DAILY AS NEEDED Patient taking differently: Take 50 mg by mouth 3 (three) times daily as needed for severe pain.  12/29/19  Yes Venia Carbon, MD    Physical Exam: Vitals:   01/18/20 0904  BP: (!) 169/91  Pulse: 82  Resp: (!) 22  Temp: 97.7 F (36.5 C)  TempSrc: Oral  SpO2: 97%  Weight: 78.5 kg  Height: 6' (1.829 m)   General: Not in acute distress HEENT:       Eyes: PERRL, EOMI, no scleral icterus.       ENT: No discharge from the ears and nose, no pharynx injection, no tonsillar enlargement.        Neck: No JVD, no bruit, no mass felt. Heme: No neck lymph node enlargement. Cardiac: S1/S2, RRR, has 3/6 systolic murmurs, No gallops or rubs. Respiratory:  No rales, wheezing, rhonchi or rubs. GI: Soft, nondistended, nontender, no rebound pain, no organomegaly, BS present. GU: No hematuria Ext: 1+ pitting leg edema bilaterally. 1+DP/PT pulse bilaterally. Musculoskeletal: No joint deformities, No joint redness or warmth, no limitation of ROM in spin. Skin: No rashes.  Neuro: Alert, oriented X3, cranial nerves II-XII grossly intact, moves all extremities normally.  Psych: Patient is not psychotic, no suicidal or hemocidal ideation.  Labs on Admission: I have personally reviewed following labs and imaging studies  CBC: Recent Labs  Lab 01/14/20 1440 01/18/20 0729  WBC 7.2 6.7  NEUTROABS 3.9  --   HGB 8.0* 7.4*  HCT 28.9* 25.7*  MCV 78* 76.3*  PLT 297 505   Basic Metabolic Panel: Recent Labs  Lab 01/14/20 1440  NA 140  K 3.9  CL 105  CO2 25  GLUCOSE 89  BUN 17  CREATININE 1.16  CALCIUM 9.0    GFR: Estimated Creatinine Clearance: 50.2 mL/min (by C-G formula based on SCr of 1.16 mg/dL). Liver Function Tests: No results for input(s): AST, ALT, ALKPHOS, BILITOT, PROT, ALBUMIN in the last 168 hours. No results for input(s): LIPASE, AMYLASE in the last 168  hours. No results for input(s): AMMONIA in the last 168 hours. Coagulation Profile: Recent Labs  Lab 01/18/20 0730  INR 1.1   Cardiac Enzymes: No results for input(s): CKTOTAL, CKMB, CKMBINDEX, TROPONINI in the last 168 hours. BNP (last 3 results) No results for input(s): PROBNP in the last 8760 hours. HbA1C: No results for input(s): HGBA1C in the last 72 hours. CBG: No results for input(s): GLUCAP in the last 168 hours. Lipid Profile: No results for input(s): CHOL, HDL, LDLCALC, TRIG, CHOLHDL, LDLDIRECT in the last 72 hours. Thyroid Function Tests: No results for input(s): TSH, T4TOTAL, FREET4, T3FREE, THYROIDAB in the last 72 hours. Anemia Panel: Recent Labs    01/18/20 0730  RETICCTPCT 1.5   Urine analysis:    Component Value Date/Time   COLORURINE YELLOW (A) 02/21/2017 0312   APPEARANCEUR Clear 08/23/2019 1629   LABSPEC 1.016 02/21/2017 0312   PHURINE 6.0 02/21/2017 0312   GLUCOSEU Negative 08/23/2019 1629   HGBUR SMALL (A) 02/21/2017 0312   BILIRUBINUR Negative 08/23/2019 1629   KETONESUR NEGATIVE 02/21/2017 0312   PROTEINUR 1+ (A) 08/23/2019 1629   PROTEINUR 30 (A) 02/21/2017 0312   UROBILINOGEN 0.2 02/27/2015 0814   NITRITE Negative 08/23/2019 1629   NITRITE NEGATIVE 02/21/2017 0312   LEUKOCYTESUR Negative 08/23/2019 1629   Sepsis Labs: @LABRCNTIP (procalcitonin:4,lacticidven:4) ) Recent Results (from the past 240 hour(s))  SARS CORONAVIRUS 2 (TAT 6-24 HRS) Nasopharyngeal Nasopharyngeal Swab     Status: None   Collection Time: 01/14/20  3:30 PM   Specimen: Nasopharyngeal Swab  Result Value Ref Range Status   SARS Coronavirus 2 NEGATIVE NEGATIVE Final    Comment: (NOTE) SARS-CoV-2 target  nucleic acids are NOT DETECTED.  The SARS-CoV-2 RNA is generally detectable in upper and lower respiratory specimens during the acute phase of infection. Negative results do not preclude SARS-CoV-2 infection, do not rule out co-infections with other pathogens, and should not be used as the sole basis for treatment or other patient management decisions. Negative results must be combined with clinical observations, patient history, and epidemiological information. The expected result is Negative.  Fact Sheet for Patients: SugarRoll.be  Fact Sheet for Healthcare Providers: https://www.woods-mathews.com/  This test is not yet approved or cleared by the [place] FDA and  has been authorized for detection and/or diagnosis of SARS-CoV-2 by FDA under an Emergency Use Authorization (EUA). This EUA will remain  in effect (meaning this test can be used) for the duration of the COVID-19 declaration under Se ction 564(b)(1) of the Act, 21 U.S.C. section 360bbb-3(b)(1), unless the authorization is terminated or revoked sooner.  Performed at [facility], [place] [address], [place], [place] [address]      Radiological Exams on Admission: No results found.   EKG: Reviewed independently, atrial fibrillation, LAD, incomplete right bundle blockade, poor R wave progression  Assessment/Plan Principal Problem:   Symptomatic anemia Active Problems:   Essential hypertension, benign   Chronic systolic congestive heart failure, NYHA class 2 (HCC)   CKD (chronic kidney disease), stage IIIA   Paroxysmal atrial fibrillation (HCC)   Cerebrovascular accident (CVA) (HCC)   Reflux esophagitis   Aortic stenosis   Dyslipidemia   Microcytic anemia   Symptomatic anemia and microcytic anemia: Hemoglobin 11.1 on 06/30/2018 --> 8.8 on 07/02/2019 --> 8.0 on 01/14/2020 --> 7.4 today. No active bleeding.  Denies dark stool or bloody stool. Patient had EGD and  colonoscopy on 09/13/2019 by Dr. Vicente Males of GI. Pt was had single gastric polyp which was clipped.  Patient  had multiple colon polyps which were removed.  -will admit to progressive bed as inpt -transfuse 1 units of blood (pt agreed with blood transfusion) -Check anemia panel -Give 20 mg of IV Lasix -pt will likely need outpt GI work up again.  Chronic systolic congestive heart failure, NYHA class 2: 2D echo 01/12/2020 showed EF 33-40%.  Patient has 1+ leg edema, BNP 703, indicating possible fluid overload.  Her shortness breath may be partially from fluid overload.  Oxygen saturation 97% on room air. -Start IV Lasix 20 mg twice daily -f/u CXR and BMP  Essential hypertension, benign -IV hydralazine as needed -Coreg, cozarr  CKD (chronic kidney disease), stage IIIA: Recent baseline creatinine 1.16 on 01/14/2020 -Pending BMP  Paroxysmal atrial fibrillation (HCC) -Hold Eliquis per Dr. Saunders Revel -continue Coreg  Cerebrovascular accident (CVA) ([place]) -lipitor  Reflux esophagitis -protonix  Aortic stenosis: -management per Dr. Saunders Revel of card --> likely cardiac cath on Thursday per Dr. Saunders Revel  Dyslipidemia -lipitor    DVT ppx: SCD Code Status: DNR (I discussed with patient and explained the meaning of [place]. Patient wants sto be DNR. DNR is confirmed by his son) Family Communication:  Yes, patient's son by phone Disposition Plan:  Anticipate discharge back to previous environment Consults called:  Dr. Saunders Revel of card Admission status:   progressive unit as inpt      Status is: Inpatient  Remains inpatient appropriate because:Inpatient level of care appropriate due to severity of illness   Dispo: The patient is from: Home              Anticipated d/c is to: Home              Anticipated d/c date is: 2 days              Patient currently is not medically stable to d/c.      Ivor Costa [facility]     Date of Service 01/18/2020      If 7PM-7AM, please contact  night-coverage [email or web address] 01/18/2020, 10:21 AM

## 2020-01-19 ENCOUNTER — Encounter: Payer: Self-pay | Admitting: Internal Medicine

## 2020-01-19 DIAGNOSIS — I25118 Atherosclerotic heart disease of native coronary artery with other forms of angina pectoris: Secondary | ICD-10-CM

## 2020-01-19 LAB — TYPE AND SCREEN
ABO/RH(D): O NEG
Antibody Screen: NEGATIVE
Unit division: 0

## 2020-01-19 LAB — BASIC METABOLIC PANEL
Anion gap: 8 (ref 5–15)
BUN: 12 mg/dL (ref 8–23)
CO2: 27 mmol/L (ref 22–32)
Calcium: 8.6 mg/dL — ABNORMAL LOW (ref 8.9–10.3)
Chloride: 106 mmol/L (ref 98–111)
Creatinine, Ser: 1.18 mg/dL (ref 0.61–1.24)
GFR calc Af Amer: >60 mL/min (ref >60)
GFR calc non Af Amer: 56 mL/min — ABNORMAL LOW (ref >60)
Glucose, Bld: 99 mg/dL (ref 70–99)
Potassium: 3.5 mmol/L (ref 3.5–5.1)
Sodium: 141 mmol/L (ref 135–145)

## 2020-01-19 LAB — CBC
HCT: 28.3 % — ABNORMAL LOW (ref 39.0–52.0)
Hemoglobin: 8.8 g/dL — ABNORMAL LOW (ref 13.0–17.0)
MCH: 22.5 pg — ABNORMAL LOW (ref 26.0–34.0)
MCHC: 31.1 g/dL (ref 30.0–36.0)
MCV: 72.4 fL — ABNORMAL LOW (ref 80.0–100.0)
Platelets: 226 10*3/uL (ref 150–400)
RBC: 3.91 MIL/uL — ABNORMAL LOW (ref 4.22–5.81)
RDW: 20.6 % — ABNORMAL HIGH (ref 11.5–15.5)
WBC: 8.1 10*3/uL (ref 4.0–10.5)
nRBC: 0 % (ref 0.0–0.2)

## 2020-01-19 LAB — BPAM RBC
Blood Product Expiration Date: 202110042359
ISSUE DATE / TIME: 202109281203
Unit Type and Rh: 9500

## 2020-01-19 LAB — MAGNESIUM: Magnesium: 1.8 mg/dL (ref 1.7–2.4)

## 2020-01-19 MED ORDER — POTASSIUM CHLORIDE CRYS ER 20 MEQ PO TBCR
40.0000 meq | EXTENDED_RELEASE_TABLET | Freq: Once | ORAL | Status: AC
  Administered 2020-01-19: 40 meq via ORAL
  Filled 2020-01-19: qty 2

## 2020-01-19 MED ORDER — FUROSEMIDE 20 MG PO TABS: 20.0000 mg | ORAL_TABLET | Freq: Every day | ORAL | Status: DC

## 2020-01-19 MED ORDER — HALOPERIDOL LACTATE 5 MG/ML IJ SOLN
2.0000 mg | Freq: Four times a day (QID) | INTRAMUSCULAR | Status: DC | PRN
  Administered 2020-01-19: 2 mg via INTRAMUSCULAR
  Filled 2020-01-19 (×2): qty 1

## 2020-01-19 MED ORDER — GUAIFENESIN 100 MG/5ML PO SOLN
5.0000 mL | ORAL | Status: DC | PRN
  Filled 2020-01-19: qty 5

## 2020-01-19 NOTE — Progress Notes (Signed)
Progress Note  Patient Name: Kyle [name] Date of Encounter: 01/19/2020  [facility] Cardiologist: Ida Rogue, MD   Subjective   Currently eating breakfast, denies chest pain or shortness of breath.  Would like to have left heart cath today and go home.  Inpatient Medications    Scheduled Meds: . atorvastatin  80 mg Oral q1800  . carvedilol  3.125 mg Oral BID  . furosemide  20 mg Intravenous BID  . influenza vaccine adjuvanted  0.5 mL Intramuscular Tomorrow-1000  . losartan  50 mg Oral Daily  . pantoprazole  40 mg Oral Daily   Continuous Infusions:  PRN Meds: acetaminophen, albuterol, carboxymethylcellul-glycerin, colchicine, dextromethorphan-guaiFENesin, diclofenac Sodium, hydrALAZINE, ondansetron (ZOFRAN) IV, oxymetazoline, traMADol   Vital Signs    Vitals:   01/18/20 1954 01/18/20 2000 01/19/20 0333 01/19/20 0734  BP: 134/90 130/73 (!) 157/84 (!) 160/99  Pulse: 84 74 92 78  Resp:    14  Temp: 98.6 F (37 C)  98.7 F (37.1 C) 98 F (36.7 C)  TempSrc: Oral  Oral Oral  SpO2: 94% 96% 93% 97%  Weight:   75.8 kg   Height:        Intake/Output Summary (Last 24 hours) at 01/19/2020 1045 Last data filed at 01/19/2020 1028 Gross per 24 hour  Intake 260 ml  Output 2800 ml  Net -2540 ml   Last 3 Weights 01/19/2020 01/18/2020 01/18/2020  Weight (lbs) 167 lb 1.6 oz 169 lb 8 oz 173 lb  Weight (kg) 75.796 kg 76.885 kg 78.472 kg      Telemetry    Atrial fibrillation heart rate 86- Personally Reviewed  ECG    No new tracing obtained- Personally Reviewed  Physical Exam   GEN: No acute distress.   Neck: No JVD Cardiac:  Irregular irregular, 2/6 systolic murmur.  Respiratory: Clear to auscultation bilaterally. GI: Soft, nontender, non-distended  MS: No edema; No deformity. Neuro:  Nonfocal  Psych: Normal affect   Labs    High Sensitivity Troponin:  No results for input(s): TROPONINIHS in the last 720 hours.    Chemistry Recent Labs  Lab  01/14/20 1440 01/18/20 0730 01/19/20 0633  NA 140 138 141  K 3.9 3.2* 3.5  CL 105 106 106  CO2 25 26 27   GLUCOSE 89 119* 99  BUN 17 14 12   CREATININE 1.16 1.29* 1.18  CALCIUM 9.0 8.8* 8.6*  GFRNONAA 57* 50* 56*  GFRAA 66 58* >60  ANIONGAP  --  6 8     Hematology Recent Labs  Lab 01/14/20 1440 01/18/20 0729 01/18/20 0730 01/19/20 0633  WBC 7.2 6.7  --  8.1  RBC 3.69* 3.37* 3.46* 3.91*  HGB 8.0* 7.4*  --  8.8*  HCT 28.9* 25.7*  --  28.3*  MCV 78* 76.3*  --  72.4*  MCH 21.7* 22.0*  --  22.5*  MCHC 27.7* 28.8*  --  31.1  RDW 18.2* 20.8*  --  20.6*  PLT 297 251  --  226    BNP Recent Labs  Lab 01/18/20 0730  BNP 703.8*     DDimer No results for input(s): DDIMER in the last 168 hours.   Radiology    DG Chest Port 1 View  Result Date: 01/18/2020 CLINICAL DATA:  Short of breath.  Post cardiac catheterization EXAM: PORTABLE CHEST 1 VIEW COMPARISON:  02/21/2017 FINDINGS: Hypoventilation with decreased lung volume. Bibasilar airspace disease left greater than right has progressed in the interval. Negative for heart failure or effusion.  IMPRESSION: Hypoventilation with bibasilar airspace disease likely atelectasis. Electronically Signed   By: Franchot Gallo M.D.   On: 01/18/2020 14:52    Cardiac Studies   Echocardiogram (01/12/2020): 1. Left ventricular ejection fraction, by estimation, is 35 to 40%. The  left ventricle has moderately decreased function. The left ventricle  demonstrates global hypokinesis. There is moderate concentric left  ventricular hypertrophy. Left ventricular  diastolic parameters are consistent with Grade II diastolic dysfunction  (pseudonormalization).  2. RVSP 32mmHg plus RA pressure. Atleast moderate pulmonary hypertension.  Right ventricular systolic function is low normal. The right ventricular  size is moderately enlarged. Mildly increased right ventricular wall  thickness. There is moderately  elevated pulmonary artery systolic  pressure.  3. Left atrial size was mildly dilated.  4. The mitral valve is normal in structure. Mild mitral valve  regurgitation.  5. Tricuspid valve regurgitation is moderate.  6. Patient has Low flow, low gradient severe aortic stenosis (PG 26mmHg,  Mean Gradient 66mmHg, DVI 0.21, AVA 0.89cm2). The aortic valve is  tricuspid. There is severe calcifcation of the aortic valve. Aortic valve  regurgitation is mild. Severe aortic  valve stenosis.  7. Aortic dilatation noted. There is mild dilatation of the ascending  aorta, measuring 42 mm.   Patient Profile     86 y.o. male with history of heart failure reduced ejection fraction, permanent A. fib severe aortic stenosis, CAD/PCI presenting with shortness of breath found to have severe anemia.  Status post blood transfusion with improvement in symptoms.  Assessment & Plan    1.  Microcytic anemia -Likely secondary to upper GI bleed due to history of peptic ulcer.  Severe AS also places possible colonic angiodysplasia in the etiology -Transfused yesterday, hemoglobin 8.8 this morning -Transfuse to keep hemoglobin over 8  2.  History of CAD, severe AS, TAVR work-up -Holding left heart cath to make sure hemoglobin stays stable -If H&H stays stable today and tomorrow, plan diagnostic left and right heart cath in the a.m. -Hold aspirin -Continue Lipitor 80 -Appears euvolemic -Additional TAVR work-up/appointments as outpatient, has appointment next week on 01/24/2020  3.  Permanent atrial fibrillation -Heart rate controlled -Hold Eliquis in light of anemia, continue Coreg.  4.  Heart failure reduced EF, EF 35 to 40% -Appears euvolemic -Continue Coreg, losartan  Total encounter time 35 minutes  Greater than 50% was spent in counseling and coordination of care with the patient     Signed, Kate Sable, MD  01/19/2020, 10:45 AM

## 2020-01-19 NOTE — Progress Notes (Signed)
Progress Note    Kyle [name]  HYQ:[id number] DOB: 08-02-1931  DOA: 01/18/2020 PCP: Venia Carbon, MD      Brief Narrative:    Medical records reviewed and are as summarized below:  Kyle [name] is a 86 y.o. male       Assessment/Plan:   Principal Problem:   Symptomatic anemia Active Problems:   Essential hypertension, benign   Chronic systolic congestive heart failure, NYHA class 2 (HCC)   CKD (chronic kidney disease), stage IIIA   Paroxysmal atrial fibrillation (HCC)   Coronary artery disease of native artery of native heart with stable angina pectoris ([place])   Cerebrovascular accident (CVA) ([place])   Reflux esophagitis   Aortic stenosis   Dyslipidemia   Microcytic anemia  Intermittent agitation/confusion/delirium  Body mass index is 21.45 kg/m.   PLAN  S/p transfusion with 1 unit of PRBCs on 01/18/2020.  Monitor H&H closely. Transfuse as needed to keep hemoglobin above 8. Change IV to oral Lasix. Continue Protonix. Continue antihypertensives. Plan for left heart cath tomorrow. Haldol IM every 6 hours as needed for agitation.  Continue one-to-one sitter for safety. Follow-up with cardiologist for further recommendations.    Diet Order            Diet Heart Room service appropriate? Yes; Fluid consistency: Thin  Diet effective now                    Consultants:  Cardiologist  Procedures:  None    Medications:   . atorvastatin  80 mg Oral q1800  . carvedilol  3.125 mg Oral BID  . furosemide  20 mg Intravenous BID  . influenza vaccine adjuvanted  0.5 mL Intramuscular Tomorrow-1000  . losartan  50 mg Oral Daily  . pantoprazole  40 mg Oral Daily   Continuous Infusions:   Anti-infectives (From admission, onward)   None             Family Communication/Anticipated D/C date and plan/Code Status   DVT prophylaxis: Place and maintain sequential compression device Start: 01/19/20 0907 SCDs Start: 01/18/20 0911      Code Status: DNR  Family Communication: None Disposition Plan:    Status is: Inpatient  Remains inpatient appropriate because:Ongoing diagnostic testing needed not appropriate for outpatient work up, Unsafe d/c plan and Inpatient level of care appropriate due to severity of illness   Dispo: The patient is from: Home              Anticipated d/c is to: Home              Anticipated d/c date is: 2 days              Patient currently is not medically stable to d/c.           Subjective:   Interval events noted.  Patient has no complaints.  However, according to sitter at the bedside, he has been intermittently confused and has been trying to get out of bed.  His nurse also reported later in the day that patient had intermittent agitation.  Objective:    Vitals:   01/19/20 0333 01/19/20 0734 01/19/20 1131 01/19/20 1522  BP: (!) 157/84 (!) 160/99 (!) 155/107 (!) 142/98  Pulse: 92 78 100 88  Resp:  14 15 15   Temp: 98.7 F (37.1 C) 98 F (36.7 C) 98 F (36.7 C) 98 F (36.7 C)  TempSrc: Oral Oral    SpO2: 93%  97% 95% 96%  Weight: 75.8 kg     Height:       No data found.   Intake/Output Summary (Last 24 hours) at 01/19/2020 1745 Last data filed at 01/19/2020 1534 Gross per 24 hour  Intake 240 ml  Output 1650 ml  Net -1410 ml   Filed Weights   01/18/20 0904 01/18/20 1341 01/19/20 0333  Weight: 78.5 kg 76.9 kg 75.8 kg    Exam:  GEN: NAD SKIN: No rash EYES: EOMI ENT: MMM CV: RRR PULM: CTA B ABD: soft, ND, NT, +BS CNS: AAO x 3, non focal EXT: No edema or tenderness   Data Reviewed:   I have personally reviewed following labs and imaging studies:  Labs: Labs show the following:   Basic Metabolic Panel: Recent Labs  Lab 01/14/20 1440 01/14/20 1440 01/18/20 0730 01/19/20 0633  NA 140  --  138 141  K 3.9   < > 3.2* 3.5  CL 105  --  106 106  CO2 25  --  26 27  GLUCOSE 89  --  119* 99  BUN 17  --  14 12  CREATININE 1.16  --  1.29* 1.18    CALCIUM 9.0  --  8.8* 8.6*  MG  --   --   --  1.8   < > = values in this interval not displayed.   GFR Estimated Creatinine Clearance: 48.2 mL/min (by C-G formula based on SCr of 1.18 mg/dL). Liver Function Tests: No results for input(s): AST, ALT, ALKPHOS, BILITOT, PROT, ALBUMIN in the last 168 hours. No results for input(s): LIPASE, AMYLASE in the last 168 hours. No results for input(s): AMMONIA in the last 168 hours. Coagulation profile Recent Labs  Lab 01/18/20 0730  INR 1.1    CBC: Recent Labs  Lab 01/14/20 1440 01/18/20 0729 01/19/20 0633  WBC 7.2 6.7 8.1  NEUTROABS 3.9  --   --   HGB 8.0* 7.4* 8.8*  HCT 28.9* 25.7* 28.3*  MCV 78* 76.3* 72.4*  PLT 297 251 226   Cardiac Enzymes: No results for input(s): CKTOTAL, CKMB, CKMBINDEX, TROPONINI in the last 168 hours. BNP (last 3 results) No results for input(s): PROBNP in the last 8760 hours. CBG: No results for input(s): GLUCAP in the last 168 hours. D-Dimer: No results for input(s): DDIMER in the last 72 hours. Hgb A1c: No results for input(s): HGBA1C in the last 72 hours. Lipid Profile: No results for input(s): CHOL, HDL, LDLCALC, TRIG, CHOLHDL, LDLDIRECT in the last 72 hours. Thyroid function studies: No results for input(s): TSH, T4TOTAL, T3FREE, THYROIDAB in the last 72 hours.  Invalid input(s): FREET3 Anemia work up: Recent Labs    01/18/20 0730 01/18/20 0904  VITAMINB12  --  810  FOLATE 21.0  --   FERRITIN 58  --   TIBC 363  --   IRON 21*  --   RETICCTPCT 1.5  --    Sepsis Labs: Recent Labs  Lab 01/14/20 1440 01/18/20 0729 01/19/20 0633  WBC 7.2 6.7 8.1    Microbiology Recent Results (from the past 240 hour(s))  SARS CORONAVIRUS 2 (TAT 6-24 HRS) Nasopharyngeal Nasopharyngeal Swab     Status: None   Collection Time: 01/14/20  3:30 PM   Specimen: Nasopharyngeal Swab  Result Value Ref Range Status   SARS Coronavirus 2 NEGATIVE NEGATIVE Final    Comment: (NOTE) SARS-CoV-2 target nucleic  acids are NOT DETECTED.  The SARS-CoV-2 RNA is generally detectable in upper and lower respiratory specimens  during the acute phase of infection. Negative results do not preclude SARS-CoV-2 infection, do not rule out co-infections with other pathogens, and should not be used as the sole basis for treatment or other patient management decisions. Negative results must be combined with clinical observations, patient history, and epidemiological information. The expected result is Negative.  Fact Sheet for Patients: SugarRoll.be  Fact Sheet for Healthcare Providers: https://www.woods-mathews.com/  This test is not yet approved or cleared by the [place] FDA and  has been authorized for detection and/or diagnosis of SARS-CoV-2 by FDA under an Emergency Use Authorization (EUA). This EUA will remain  in effect (meaning this test can be used) for the duration of the COVID-19 declaration under Se ction 564(b)(1) of the Act, 21 U.S.C. section 360bbb-3(b)(1), unless the authorization is terminated or revoked sooner.  Performed at [facility], [place] [address] [place], [place] [address]   Respiratory Panel by RT PCR (Flu A&B, Covid) - Nasopharyngeal Swab     Status: None   Collection Time: 01/18/20  5:57 PM   Specimen: Nasopharyngeal Swab  Result Value Ref Range Status   SARS Coronavirus 2 by RT PCR NEGATIVE NEGATIVE Final    Comment: (NOTE) SARS-CoV-2 target nucleic acids are NOT DETECTED.  The SARS-CoV-2 RNA is generally detectable in upper respiratoy specimens during the acute phase of infection. The lowest concentration of SARS-CoV-2 viral copies this assay can detect is 131 copies/mL. A negative result does not preclude SARS-Cov-2 infection and should not be used as the sole basis for treatment or other patient management decisions. A negative result may occur with  improper specimen collection/handling, submission of specimen  other than nasopharyngeal swab, presence of viral mutation(s) within the areas targeted by this assay, and inadequate number of viral copies (<131 copies/mL). A negative result must be combined with clinical observations, patient history, and epidemiological information. The expected result is Negative.  Fact Sheet for Patients:  PinkCheek.be  Fact Sheet for Healthcare Providers:  GravelBags.it  This test is no t yet approved or cleared by the [place] FDA and  has been authorized for detection and/or diagnosis of SARS-CoV-2 by FDA under an Emergency Use Authorization (EUA). This EUA will remain  in effect (meaning this test can be used) for the duration of the COVID-19 declaration under Section 564(b)(1) of the Act, 21 U.S.C. section 360bbb-3(b)(1), unless the authorization is terminated or revoked sooner.     Influenza A by PCR NEGATIVE NEGATIVE Final   Influenza B by PCR NEGATIVE NEGATIVE Final    Comment: (NOTE) The Xpert Xpress SARS-CoV-2/FLU/RSV assay is intended as an aid in  the diagnosis of influenza from Nasopharyngeal swab specimens and  should not be used as a sole basis for treatment. Nasal washings and  aspirates are unacceptable for Xpert Xpress SARS-CoV-2/FLU/RSV  testing.  Fact Sheet for Patients: PinkCheek.be  Fact Sheet for Healthcare Providers: GravelBags.it  This test is not yet approved or cleared by the [place] FDA and  has been authorized for detection and/or diagnosis of SARS-CoV-2 by  FDA under an Emergency Use Authorization (EUA). This EUA will remain  in effect (meaning this test can be used) for the duration of the  Covid-19 declaration under Section 564(b)(1) of the Act, 21  U.S.C. section 360bbb-3(b)(1), unless the authorization is  terminated or revoked. Performed at [facility], [address],  [place],  [address]     Procedures and diagnostic studies:  DG Chest [facility] 1 View  Result Date:  01/18/2020 CLINICAL DATA:  Short of breath.  Post cardiac catheterization EXAM: PORTABLE CHEST 1 VIEW COMPARISON:  02/21/2017 FINDINGS: Hypoventilation with decreased lung volume. Bibasilar airspace disease left greater than right has progressed in the interval. Negative for heart failure or effusion. IMPRESSION: Hypoventilation with bibasilar airspace disease likely atelectasis. Electronically Signed   By: Franchot Gallo M.D.   On: 01/18/2020 14:52               LOS: 1 day   Aryanah Enslow  [facility]   Pager on [email or web address]. If 7PM-7AM, please contact night-coverage at [email or web address]     01/19/2020, 5:45 PM

## 2020-01-19 NOTE — Progress Notes (Signed)
Patient complains of cough. Spitting up frothy sputum. Small-Medium Amount. This RN ordered PRN Robitussin from RN standing orders. Will admin when arrives from Pharmacy.

## 2020-01-20 ENCOUNTER — Telehealth: Payer: Self-pay | Admitting: Physician Assistant

## 2020-01-20 ENCOUNTER — Encounter
Admission: RE | Disposition: A | Discharge status: Home / Self Care | Payer: Self-pay | Source: Home / Self Care | Attending: Internal Medicine

## 2020-01-20 ENCOUNTER — Encounter: Payer: Self-pay | Admitting: Cardiovascular Disease

## 2020-01-20 DIAGNOSIS — N1831 Chronic kidney disease, stage 3a: Secondary | ICD-10-CM

## 2020-01-20 DIAGNOSIS — E785 Hyperlipidemia, unspecified: Secondary | ICD-10-CM

## 2020-01-20 DIAGNOSIS — I10 Essential (primary) hypertension: Secondary | ICD-10-CM

## 2020-01-20 DIAGNOSIS — I272 Pulmonary hypertension, unspecified: Secondary | ICD-10-CM

## 2020-01-20 DIAGNOSIS — D509 Iron deficiency anemia, unspecified: Secondary | ICD-10-CM

## 2020-01-20 DIAGNOSIS — R0602 Shortness of breath: Secondary | ICD-10-CM

## 2020-01-20 DIAGNOSIS — I480 Paroxysmal atrial fibrillation: Secondary | ICD-10-CM

## 2020-01-20 HISTORY — PX: RIGHT/LEFT HEART CATH AND CORONARY ANGIOGRAPHY: CATH118266

## 2020-01-20 LAB — CBC WITH DIFFERENTIAL/PLATELET
Abs Immature Granulocytes: 0.06 10*3/uL (ref 0.00–0.07)
Basophils Absolute: 0.1 10*3/uL (ref 0.0–0.1)
Basophils Relative: 1 %
Eosinophils Absolute: 0 10*3/uL (ref 0.0–0.5)
Eosinophils Relative: 0 %
HCT: 29.6 % — ABNORMAL LOW (ref 39.0–52.0)
Hemoglobin: 8.7 g/dL — ABNORMAL LOW (ref 13.0–17.0)
Immature Granulocytes: 1 %
Lymphocytes Relative: 18 %
Lymphs Abs: 2 10*3/uL (ref 0.7–4.0)
MCH: 21.9 pg — ABNORMAL LOW (ref 26.0–34.0)
MCHC: 29.4 g/dL — ABNORMAL LOW (ref 30.0–36.0)
MCV: 74.6 fL — ABNORMAL LOW (ref 80.0–100.0)
Monocytes Absolute: 1.4 10*3/uL — ABNORMAL HIGH (ref 0.1–1.0)
Monocytes Relative: 12 %
Neutro Abs: 7.8 10*3/uL — ABNORMAL HIGH (ref 1.7–7.7)
Neutrophils Relative %: 68 %
Platelets: 246 10*3/uL (ref 150–400)
RBC: 3.97 MIL/uL — ABNORMAL LOW (ref 4.22–5.81)
RDW: 20 % — ABNORMAL HIGH (ref 11.5–15.5)
WBC: 11.3 10*3/uL — ABNORMAL HIGH (ref 4.0–10.5)
nRBC: 0 % (ref 0.0–0.2)

## 2020-01-20 LAB — BASIC METABOLIC PANEL
Anion gap: 10 (ref 5–15)
BUN: 15 mg/dL (ref 8–23)
CO2: 27 mmol/L (ref 22–32)
Calcium: 8.7 mg/dL — ABNORMAL LOW (ref 8.9–10.3)
Chloride: 105 mmol/L (ref 98–111)
Creatinine, Ser: 1.46 mg/dL — ABNORMAL HIGH (ref 0.61–1.24)
GFR calc Af Amer: 50 mL/min — ABNORMAL LOW (ref >60)
GFR calc non Af Amer: 43 mL/min — ABNORMAL LOW (ref >60)
Glucose, Bld: 113 mg/dL — ABNORMAL HIGH (ref 70–99)
Potassium: 3.8 mmol/L (ref 3.5–5.1)
Sodium: 142 mmol/L (ref 135–145)

## 2020-01-20 SURGERY — RIGHT/LEFT HEART CATH AND CORONARY ANGIOGRAPHY
Anesthesia: Moderate Sedation

## 2020-01-20 MED ORDER — ASPIRIN 81 MG PO CHEW
81.0000 mg | CHEWABLE_TABLET | ORAL | Status: AC
  Administered 2020-01-20: 81 mg via ORAL
  Filled 2020-01-20: qty 1

## 2020-01-20 MED ORDER — HEPARIN (PORCINE) IN NACL 1000-0.9 UT/500ML-% IV SOLN
INTRAVENOUS | Status: AC
  Filled 2020-01-20: qty 500

## 2020-01-20 MED ORDER — SODIUM CHLORIDE 0.9% FLUSH
3.0000 mL | Freq: Two times a day (BID) | INTRAVENOUS | Status: DC
  Administered 2020-01-20 (×2): 3 mL via INTRAVENOUS

## 2020-01-20 MED ORDER — DIPHENHYDRAMINE HCL 50 MG/ML IJ SOLN
INTRAMUSCULAR | Status: AC
  Filled 2020-01-20: qty 1

## 2020-01-20 MED ORDER — SODIUM CHLORIDE 0.9 % IV SOLN: 250.0000 mL | INTRAVENOUS | Status: DC | PRN

## 2020-01-20 MED ORDER — ASPIRIN 81 MG PO CHEW: 81.0000 mg | CHEWABLE_TABLET | ORAL | Status: DC

## 2020-01-20 MED ORDER — FAMOTIDINE 20 MG PO TABS
ORAL_TABLET | ORAL | Status: AC
  Filled 2020-01-20: qty 1

## 2020-01-20 MED ORDER — SODIUM CHLORIDE 0.9% FLUSH: 3.0000 mL | INTRAVENOUS | Status: DC | PRN

## 2020-01-20 MED ORDER — HEPARIN (PORCINE) IN NACL 1000-0.9 UT/500ML-% IV SOLN
INTRAVENOUS | Status: DC | PRN
  Administered 2020-01-20: 500 mL

## 2020-01-20 MED ORDER — SODIUM CHLORIDE 0.9 % IV SOLN: INTRAVENOUS | Status: DC

## 2020-01-20 MED ORDER — IOHEXOL 300 MG/ML  SOLN
INTRAMUSCULAR | Status: DC | PRN
  Administered 2020-01-20: 100 mL

## 2020-01-20 SURGICAL SUPPLY — 12 items
CATH INFINITI 5FR JL4 (CATHETERS) ×3 IMPLANT
CATH INFINITI 5FR JL5 (CATHETERS) ×6 IMPLANT
CATH INFINITI JR4 5F (CATHETERS) ×3 IMPLANT
CATH SWANZ 7F THERMO (CATHETERS) ×3 IMPLANT
DEVICE CLOSURE MYNXGRIP 5F (Vascular Products) ×3 IMPLANT
KIT MANI 3VAL PERCEP (MISCELLANEOUS) ×3 IMPLANT
NEEDLE PERC 18GX7CM (NEEDLE) ×3 IMPLANT
PACK CARDIAC CATH (CUSTOM PROCEDURE TRAY) ×3 IMPLANT
SHEATH AVANTI 5FR X 11CM (SHEATH) ×3 IMPLANT
SHEATH AVANTI 7FRX11 (SHEATH) ×3 IMPLANT
WIRE GUIDERIGHT .035X150 (WIRE) ×3 IMPLANT
WIRE HITORQ VERSACORE ST 145CM (WIRE) ×3 IMPLANT

## 2020-01-20 NOTE — Telephone Encounter (Signed)
  [facility]   MULTIDISCIPLINARY HEART VALVE TEAM  Pt recently referred to structural heart for TAVR consultation and apt made with Dr. Angelena Form on 10/4. The pt is currently admitted at [facility] with acute anemia and unstable angina, pending heart cath today. Per son, the patient is furious he is still in the hospital and no longer interested in TAVR. He understands the poor life expectancy associated with severe AS and they are prepared to call in palliative care if necessary. He is adamant about cancelling his apt with structural heart. He will attend his post hospital visit on 10/8 and call us back if he changes his mind.   Angelena Form PA-C  [facility]

## 2020-01-20 NOTE — Progress Notes (Signed)
SATURATION QUALIFICATIONS: (This note is used to comply with regulatory documentation for home oxygen)  Patient Saturations on Room Air at Rest = 86%  Patient Saturations on Room Air while Ambulating = n/a%  Patient Saturations on 2 Liters of oxygen while Ambulating = 93%  Please briefly explain why patient needs home oxygen:

## 2020-01-20 NOTE — Progress Notes (Signed)
Progress Note  Patient Name: Kyle [name] Date of Encounter: 01/20/2020  Primary Cardiologist: Ida Rogue, MD  Subjective   No c/p or sob.  Nsg staff reports some confusion/disorientation overnight.  Currently alert and oriented. Eager to have cath completed so that he may go home.  Inpatient Medications    Scheduled Meds: . atorvastatin  80 mg Oral q1800  . carvedilol  3.125 mg Oral BID  . furosemide  20 mg Oral Daily  . influenza vaccine adjuvanted  0.5 mL Intramuscular Tomorrow-1000  . losartan  50 mg Oral Daily  . pantoprazole  40 mg Oral Daily   Continuous Infusions:  PRN Meds: acetaminophen, albuterol, carboxymethylcellul-glycerin, colchicine, dextromethorphan-guaiFENesin, diclofenac Sodium, guaiFENesin, haloperidol lactate, hydrALAZINE, ondansetron (ZOFRAN) IV, oxymetazoline, traMADol   Vital Signs    Vitals:   01/19/20 1131 01/19/20 1522 01/19/20 1959 01/20/20 0432  BP: (!) 155/107 (!) 142/98 100/75 118/60  Pulse: 100 88 96 99  Resp: 15 15 16 17   Temp: 98 F (36.7 C) 98 F (36.7 C) 98.9 F (37.2 C) 97.8 F (36.6 C)  TempSrc:   Oral Oral  SpO2: 95% 96% (!) 78% (!) 88%  Weight:    77.7 kg  Height:        Intake/Output Summary (Last 24 hours) at 01/20/2020 0744 Last data filed at 01/20/2020 0432 Gross per 24 hour  Intake 240 ml  Output 650 ml  Net -410 ml   Filed Weights   01/18/20 1341 01/19/20 0333 01/20/20 0432  Weight: 76.9 kg 75.8 kg 77.7 kg    Physical Exam   GEN: Well nourished, well developed, in no acute distress.  HEENT: Grossly normal.  Neck: Supple, no JVD, carotid bruits, or masses. Cardiac: IR, IR, 3/6 SEM @ upper sternal borders w/ more blowing quality 3/6 syst murmur @ apex, no rubs, or gallops. No clubbing, cyanosis, edema.   R radial/R Femoral 2+, DP/PT 1+ and equal bilaterally.  Respiratory:  Respirations regular and unlabored, clear to auscultation bilaterally. GI: Soft, nontender, nondistended, BS + x 4. MS: no deformity  or atrophy. Skin: warm and dry, no rash. Neuro:  Strength and sensation are intact. Psych: AAOx3.  Normal affect.  Labs    Chemistry Recent Labs  Lab 01/18/20 0730 01/19/20 0633 01/20/20 0412  NA 138 141 142  K 3.2* 3.5 3.8  CL 106 106 105  CO2 26 27 27   GLUCOSE 119* 99 113*  BUN 14 12 15   CREATININE 1.29* 1.18 1.46*  CALCIUM 8.8* 8.6* 8.7*  GFRNONAA 50* 56* 43*  GFRAA 58* >60 50*  ANIONGAP 6 8 10      Hematology Recent Labs  Lab 01/18/20 0729 01/18/20 0730 01/19/20 0633 01/20/20 0412  WBC 6.7  --  8.1 11.3*  RBC 3.37* 3.46* 3.91* 3.97*  HGB 7.4*  --  8.8* 8.7*  HCT 25.7*  --  28.3* 29.6*  MCV 76.3*  --  72.4* 74.6*  MCH 22.0*  --  22.5* 21.9*  MCHC 28.8*  --  31.1 29.4*  RDW 20.8*  --  20.6* 20.0*  PLT 251  --  226 246    BNP Recent Labs  Lab 01/18/20 0730  BNP 703.8*     Lipids  Lab Results  Component Value Date   CHOL 92 07/02/2019   HDL 39.70 07/02/2019   LDLCALC 41 07/02/2019   LDLDIRECT 164.4 10/10/2010   TRIG 58.0 07/02/2019   CHOLHDL 2 07/02/2019    HbA1c  Lab Results  Component Value Date  HGBA1C 6.1 (H) 05/10/2014    Radiology    DG Chest Port 1 View  Result Date: 01/18/2020 CLINICAL DATA:  Short of breath.  Post cardiac catheterization EXAM: PORTABLE CHEST 1 VIEW COMPARISON:  02/21/2017 FINDINGS: Hypoventilation with decreased lung volume. Bibasilar airspace disease left greater than right has progressed in the interval. Negative for heart failure or effusion. IMPRESSION: Hypoventilation with bibasilar airspace disease likely atelectasis. Electronically Signed   By: Franchot Gallo M.D.   On: 01/18/2020 14:52    Telemetry    AFib, predominantly 80's to 90's, freq pvcs - Personally Reviewed  Cardiac Studies   2D Echocardiogram 9.22.2021  1. Left ventricular ejection fraction, by estimation, is 35 to 40%. The  left ventricle has moderately decreased function. The left ventricle  demonstrates global hypokinesis. There is  moderate concentric left  ventricular hypertrophy. Left ventricular  diastolic parameters are consistent with Grade II diastolic dysfunction  (pseudonormalization).  2. RVSP 72mmHg plus RA pressure. Atleast moderate pulmonary hypertension.  Right ventricular systolic function is low normal. The right ventricular  size is moderately enlarged. Mildly increased right ventricular wall  thickness. There is moderately  elevated pulmonary artery systolic pressure.  3. Left atrial size was mildly dilated.  4. The mitral valve is normal in structure. Mild mitral valve  regurgitation.  5. Tricuspid valve regurgitation is moderate.  6. Patient has Low flow, low gradient severe aortic stenosis (PG 70mmHg,  Mean Gradient 52mmHg, DVI 0.21, AVA 0.89cm2). The aortic valve is  tricuspid. There is severe calcifcation of the aortic valve. Aortic valve  regurgitation is mild. Severe aortic  valve stenosis.  7. Aortic dilatation noted. There is mild dilatation of the ascending  aorta, measuring 42 mm.  _____________   Patient Profile   86 y.o. male with history of heart failure reduced ejection fraction, permanent A. fib severe aortic stenosis, CAD/PCI presenting with shortness of breath found to have severe anemia.  Status post blood transfusion with improvement in symptoms.  Assessment & Plan    1.  USA/CAD:  H/o NSTEMI w/ PCI/DES to the LAD in 02/2015.  2+ mos h/o progressive DOE/reduced ex tolerance.  Found to have sl worsening of LV fxn w/ severe AS on recent echo.  Setup for outpt cath on 9/28, however, this was cancelled due to persistent anemia w/ H/H of 7.4/25.7 on 9/28.  Now s/p 1u PRBCs.  H/H stable.  No c/p.  Plan R and L heart cath today.  Cont  blocker, arb, statin rx.  No ASA @ home 2/2 chronic OAC.  2.  Severe Ao Stenosis:  Recent echo w/ evidence of low flow, low gradient severe Ao stenosis w/ peak grad of only 37mmHg but a valve area of 0.89cm^2.  As above, H/H stable.  Will plan R  & L heart cath today.  He has f/u in TAVR clinic next week.  3.  ICM/HFrEF:  EF 35-40% by recent echo.  Euvolemic on exam.  He has received IV lasix this admission in setting of prbcs.  Creat up sl @ 1.46.  Will hold diuretic and ARB this AM.  Limit contrast w/ cath.  Gentle hydration pre/post-cath.  Cont  blocker   4.  Microcytic anemia: H/H stable this AM.  Prior EGD/Colonoscopy w/o significant findings of bleeding.  ? Role of severe AS/Heyde syndrome.  5.  CKD 2-3:  Creat sl worse w/ diuresis this admission.  Holding lasix/arb this AM.  If he is discharged this pm, he will need a f/u  bmet next Monday.  6.  Permanent Afib:  Relatively rate-controlled on  blocker.  Eliquis rx currently on hold in prep for cath today.  7.  HL:  LDL 41 in March.  Cont high potency statin rx.  Signed, Murray Hodgkins, NP  01/20/2020, 7:44 AM    For questions or updates, please contact   Please consult [email or web address] for contact info under Cardiology/STEMI.

## 2020-01-20 NOTE — Progress Notes (Addendum)
Progress Note    Kyle [name]  EPP:[id number] DOB: July 06, 1931  DOA: 01/18/2020 PCP: Venia Carbon, MD      Brief Narrative:    Medical records reviewed and are as summarized below:  Kyle [name] is a 86 y.o. male       Assessment/Plan:   Principal Problem:   Symptomatic anemia Active Problems:   Essential hypertension, benign   Chronic systolic congestive heart failure, NYHA class 2 (HCC)   CKD (chronic kidney disease), stage IIIA   Paroxysmal atrial fibrillation (HCC)   Coronary artery disease of native artery of native heart with stable angina pectoris ([facility])   Cerebrovascular accident (CVA) ([place])   Reflux esophagitis   Aortic stenosis   Dyslipidemia   Microcytic anemia  Intermittent agitation/confusion/delirium CKD stage IIIa Moderate pulmonary hypertension  Body mass index is 22.78 kg/m.   PLAN  s/p left and right heart cath today which showed moderate pulmonary hypertension.  However, aortic stenosis was only mild by cath meaning that echo had overestimated his aortic stenosis.  Appointment with [facility] clinic has been canceled by cardiologist.  S/p transfusion with 1 unit of PRBCs on 01/18/2020.  Monitor H&H closely. Transfuse as needed to keep hemoglobin above 8.  Oxygen saturation was 88% on room air at rest.  He probably has underlying chronic hypoxemic respiratory failure.  He will likely need home oxygen.  Case manager to arrange for home oxygen tomorrow.  Hold Lasix because of increasing creatinine.   Repeat BMP tomorrow given contrast exposure today. Continue Protonix. Continue antihypertensives.      Diet Order            Diet Heart Room service appropriate? Yes; Fluid consistency: Thin  Diet effective now                    Consultants:  Cardiologist  Procedures:  Right and left heart cath    Medications:   . atorvastatin  80 mg Oral q1800  . carvedilol  3.125 mg Oral BID  . influenza vaccine adjuvanted  0.5  mL Intramuscular Tomorrow-1000  . pantoprazole  40 mg Oral Daily  . sodium chloride flush  3 mL Intravenous Q12H   Continuous Infusions:   Anti-infectives (From admission, onward)   None             Family Communication/Anticipated D/C date and plan/Code Status   DVT prophylaxis: Place and maintain sequential compression device Start: 01/19/20 0907 SCDs Start: 01/18/20 0911     Code Status: DNR  Family Communication: None Disposition Plan:    Status is: Inpatient  Remains inpatient appropriate because:Unsafe d/c plan and Inpatient level of care appropriate due to severity of illness   Dispo: The patient is from: Home              Anticipated d/c is to: Home              Anticipated d/c date is: 1 day              Patient currently is not medically stable to d/c.           Subjective:   Interval events noted.  No chest pain or shortness of breath.  Objective:    Vitals:   01/20/20 1300 01/20/20 1332 01/20/20 1459 01/20/20 1543  BP: 140/78 138/83 130/86 138/86  Pulse: 90 85 94 (!) 58  Resp: (!) 31 18  17   Temp:  98.2 F (36.8 C)  98.1 F (36.7 C) (!) 97.5 F (36.4 C)  TempSrc:  Oral Oral Oral  SpO2: 97% 100% 100% 99%  Weight:      Height:       No data found.   Intake/Output Summary (Last 24 hours) at 01/20/2020 1727 Last data filed at 01/20/2020 1559 Gross per 24 hour  Intake 66.37 ml  Output 900 ml  Net -833.63 ml   Filed Weights   01/19/20 0333 01/20/20 0432 01/20/20 1032  Weight: 75.8 kg 77.7 kg 76.2 kg    Exam:  GEN: NAD SKIN: Warm and dry EYES: EOMI ENT: MMM CV: RRR, soft systolic murmur in aortic area. PULM: No wheezing or rales heard ABD: soft, ND, NT, +BS CNS: AAO x 3, non focal EXT: No edema or tenderness    Data Reviewed:   I have personally reviewed following labs and imaging studies:  Labs: Labs show the following:   Basic Metabolic Panel: Recent Labs  Lab 01/14/20 1440 01/14/20 1440 01/18/20 0730  01/18/20 0730 01/19/20 0633 01/20/20 0412  NA 140  --  138  --  141 142  K 3.9   < > 3.2*   < > 3.5 3.8  CL 105  --  106  --  106 105  CO2 25  --  26  --  27 27  GLUCOSE 89  --  119*  --  99 113*  BUN 17  --  14  --  12 15  CREATININE 1.16  --  1.29*  --  1.18 1.46*  CALCIUM 9.0  --  8.8*  --  8.6* 8.7*  MG  --   --   --   --  1.8  --    < > = values in this interval not displayed.   GFR Estimated Creatinine Clearance: 39.1 mL/min (A) (by C-G formula based on SCr of 1.46 mg/dL (H)). Liver Function Tests: No results for input(s): AST, ALT, ALKPHOS, BILITOT, PROT, ALBUMIN in the last 168 hours. No results for input(s): LIPASE, AMYLASE in the last 168 hours. No results for input(s): AMMONIA in the last 168 hours. Coagulation profile Recent Labs  Lab 01/18/20 0730  INR 1.1    CBC: Recent Labs  Lab 01/14/20 1440 01/18/20 0729 01/19/20 0633 01/20/20 0412  WBC 7.2 6.7 8.1 11.3*  NEUTROABS 3.9  --   --  7.8*  HGB 8.0* 7.4* 8.8* 8.7*  HCT 28.9* 25.7* 28.3* 29.6*  MCV 78* 76.3* 72.4* 74.6*  PLT 297 251 226 246   Cardiac Enzymes: No results for input(s): CKTOTAL, CKMB, CKMBINDEX, TROPONINI in the last 168 hours. BNP (last 3 results) No results for input(s): PROBNP in the last 8760 hours. CBG: No results for input(s): GLUCAP in the last 168 hours. D-Dimer: No results for input(s): DDIMER in the last 72 hours. Hgb A1c: No results for input(s): HGBA1C in the last 72 hours. Lipid Profile: No results for input(s): CHOL, HDL, LDLCALC, TRIG, CHOLHDL, LDLDIRECT in the last 72 hours. Thyroid function studies: No results for input(s): TSH, T4TOTAL, T3FREE, THYROIDAB in the last 72 hours.  Invalid input(s): FREET3 Anemia work up: Recent Labs    01/18/20 0730 01/18/20 0904  VITAMINB12  --  810  FOLATE 21.0  --   FERRITIN 58  --   TIBC 363  --   IRON 21*  --   RETICCTPCT 1.5  --    Sepsis Labs: Recent Labs  Lab 01/14/20 1440 01/18/20 0729 01/19/20 0633  01/20/20 0412  WBC  7.2 6.7 8.1 11.3*    Microbiology Recent Results (from the past 240 hour(s))  SARS CORONAVIRUS 2 (TAT 6-24 HRS) Nasopharyngeal Nasopharyngeal Swab     Status: None   Collection Time: 01/14/20  3:30 PM   Specimen: Nasopharyngeal Swab  Result Value Ref Range Status   SARS Coronavirus 2 NEGATIVE NEGATIVE Final    Comment: (NOTE) SARS-CoV-2 target nucleic acids are NOT DETECTED.  The SARS-CoV-2 RNA is generally detectable in upper and lower respiratory specimens during the acute phase of infection. Negative results do not preclude SARS-CoV-2 infection, do not rule out co-infections with other pathogens, and should not be used as the sole basis for treatment or other patient management decisions. Negative results must be combined with clinical observations, patient history, and epidemiological information. The expected result is Negative.  Fact Sheet for Patients: SugarRoll.be  Fact Sheet for Healthcare Providers: https://www.woods-mathews.com/  This test is not yet approved or cleared by the [place] FDA and  has been authorized for detection and/or diagnosis of SARS-CoV-2 by FDA under an Emergency Use Authorization (EUA). This EUA will remain  in effect (meaning this test can be used) for the duration of the COVID-19 declaration under Se ction 564(b)(1) of the Act, 21 U.S.C. section 360bbb-3(b)(1), unless the authorization is terminated or revoked sooner.  Performed at [facility], [place] [address] [place], [place] [address]   Respiratory Panel by RT PCR (Flu A&B, Covid) - Nasopharyngeal Swab     Status: None   Collection Time: 01/18/20  5:57 PM   Specimen: Nasopharyngeal Swab  Result Value Ref Range Status   SARS Coronavirus 2 by RT PCR NEGATIVE NEGATIVE Final    Comment: (NOTE) SARS-CoV-2 target nucleic acids are NOT DETECTED.  The SARS-CoV-2 RNA is generally detectable in upper respiratoy specimens  during the acute phase of infection. The lowest concentration of SARS-CoV-2 viral copies this assay can detect is 131 copies/mL. A negative result does not preclude SARS-Cov-2 infection and should not be used as the sole basis for treatment or other patient management decisions. A negative result may occur with  improper specimen collection/handling, submission of specimen other than nasopharyngeal swab, presence of viral mutation(s) within the areas targeted by this assay, and inadequate number of viral copies (<131 copies/mL). A negative result must be combined with clinical observations, patient history, and epidemiological information. The expected result is Negative.  Fact Sheet for Patients:  PinkCheek.be  Fact Sheet for Healthcare Providers:  GravelBags.it  This test is no t yet approved or cleared by the [place] FDA and  has been authorized for detection and/or diagnosis of SARS-CoV-2 by FDA under an Emergency Use Authorization (EUA). This EUA will remain  in effect (meaning this test can be used) for the duration of the COVID-19 declaration under Section 564(b)(1) of the Act, 21 U.S.C. section 360bbb-3(b)(1), unless the authorization is terminated or revoked sooner.     Influenza A by PCR NEGATIVE NEGATIVE Final   Influenza B by PCR NEGATIVE NEGATIVE Final    Comment: (NOTE) The Xpert Xpress SARS-CoV-2/FLU/RSV assay is intended as an aid in  the diagnosis of influenza from Nasopharyngeal swab specimens and  should not be used as a sole basis for treatment. Nasal washings and  aspirates are unacceptable for Xpert Xpress SARS-CoV-2/FLU/RSV  testing.  Fact Sheet for Patients: PinkCheek.be  Fact Sheet for Healthcare Providers: GravelBags.it  This test is not yet approved or cleared by the [place] FDA and  has been authorized for detection  and/or  diagnosis of SARS-CoV-2 by  FDA under an Emergency Use Authorization (EUA). This EUA will remain  in effect (meaning this test can be used) for the duration of the  Covid-19 declaration under Section 564(b)(1) of the Act, 21  U.S.C. section 360bbb-3(b)(1), unless the authorization is  terminated or revoked. Performed at [facility], [address] [place], [place] [address]     Procedures and diagnostic studies:  CARDIAC CATHETERIZATION  Result Date: 01/20/2020  Previously placed Mid LAD drug eluting stent is widely patent.  Balloon angioplasty was performed.  Ost 1st Diag lesion is 50% stenosed.  Ost 2nd Diag lesion is 50% stenosed.  Ost Ramus to Ramus lesion is 25% stenosed.  Dist Cx lesion is 75% stenosed.  LPDA lesion is 90% stenosed.  Ost Cx to Prox Cx lesion is 25% stenosed.  Prox RCA-1 lesion is 80% stenosed.  Prox RCA-2 lesion is 80% stenosed.  The left ventricular ejection fraction is 35-45% by visual estimate.  LV end diastolic pressure is mildly elevated.  There is moderate left ventricular systolic dysfunction.  Hemodynamic findings consistent with moderate pulmonary hypertension.                LOS: 2 days   Lavell Supple  [facility]   Pager on [email or web address]. If 7PM-7AM, please contact night-coverage at [email or web address]     01/20/2020, 5:27 PM

## 2020-01-20 NOTE — Progress Notes (Signed)
Request for transport to [facility] placed.

## 2020-01-21 DIAGNOSIS — J9611 Chronic respiratory failure with hypoxia: Secondary | ICD-10-CM | POA: Diagnosis present

## 2020-01-21 LAB — BASIC METABOLIC PANEL
Anion gap: 7 (ref 5–15)
BUN: 23 mg/dL (ref 8–23)
CO2: 26 mmol/L (ref 22–32)
Calcium: 8.3 mg/dL — ABNORMAL LOW (ref 8.9–10.3)
Chloride: 100 mmol/L (ref 98–111)
Creatinine, Ser: 1.41 mg/dL — ABNORMAL HIGH (ref 0.61–1.24)
GFR calc Af Amer: 52 mL/min — ABNORMAL LOW (ref >60)
GFR calc non Af Amer: 45 mL/min — ABNORMAL LOW (ref >60)
Glucose, Bld: 134 mg/dL — ABNORMAL HIGH (ref 70–99)
Potassium: 3.5 mmol/L (ref 3.5–5.1)
Sodium: 133 mmol/L — ABNORMAL LOW (ref 135–145)

## 2020-01-21 LAB — CBC WITH DIFFERENTIAL/PLATELET
Abs Immature Granulocytes: 0.03 10*3/uL (ref 0.00–0.07)
Basophils Absolute: 0 10*3/uL (ref 0.0–0.1)
Basophils Relative: 0 %
Eosinophils Absolute: 0.1 10*3/uL (ref 0.0–0.5)
Eosinophils Relative: 1 %
HCT: 26.8 % — ABNORMAL LOW (ref 39.0–52.0)
Hemoglobin: 8.1 g/dL — ABNORMAL LOW (ref 13.0–17.0)
Immature Granulocytes: 0 %
Lymphocytes Relative: 17 %
Lymphs Abs: 1.6 10*3/uL (ref 0.7–4.0)
MCH: 22.4 pg — ABNORMAL LOW (ref 26.0–34.0)
MCHC: 30.2 g/dL (ref 30.0–36.0)
MCV: 74.2 fL — ABNORMAL LOW (ref 80.0–100.0)
Monocytes Absolute: 1.2 10*3/uL — ABNORMAL HIGH (ref 0.1–1.0)
Monocytes Relative: 13 %
Neutro Abs: 6.8 10*3/uL (ref 1.7–7.7)
Neutrophils Relative %: 69 %
Platelets: 208 10*3/uL (ref 150–400)
RBC: 3.61 MIL/uL — ABNORMAL LOW (ref 4.22–5.81)
RDW: 20.7 % — ABNORMAL HIGH (ref 11.5–15.5)
WBC: 9.8 10*3/uL (ref 4.0–10.5)
nRBC: 0 % (ref 0.0–0.2)

## 2020-01-21 MED ORDER — FUROSEMIDE 40 MG PO TABS
40.0000 mg | ORAL_TABLET | Freq: Every day | ORAL | Status: DC
  Administered 2020-01-21: 40 mg via ORAL
  Filled 2020-01-21: qty 1

## 2020-01-21 MED ORDER — SODIUM CHLORIDE 0.9 % IV SOLN
200.0000 mg | Freq: Once | INTRAVENOUS | Status: AC
  Administered 2020-01-21: 200 mg via INTRAVENOUS
  Filled 2020-01-21: qty 10

## 2020-01-21 MED ORDER — FUROSEMIDE 40 MG PO TABS: 40.0000 mg | ORAL_TABLET | Freq: Every day | ORAL | 0 refills | Status: AC

## 2020-01-21 NOTE — Discharge Summary (Signed)
Physician Discharge Summary  Kyle [name] FIE:[id number] DOB: July 30, 1931 DOA: 01/18/2020  PCP: Venia Carbon, MD  Admit date: 01/18/2020 Discharge date: 01/21/2020  Discharge disposition: Home   Recommendations for Outpatient Follow-Up:   Follow-up with PCP 1 week Follow-up with cardiologist as scheduled   Discharge Diagnosis:   Principal Problem:   Symptomatic anemia Active Problems:   Essential hypertension, benign   Chronic systolic congestive heart failure, NYHA class 2 (HCC)   CKD (chronic kidney disease), stage IIIA   Paroxysmal atrial fibrillation (HCC)   Coronary artery disease of native artery of native heart with stable angina pectoris ([place])   Cerebrovascular accident (CVA) ([place])   Reflux esophagitis   Aortic stenosis   Dyslipidemia   Microcytic anemia   Chronic respiratory failure with hypoxia ([place])    Discharge Condition: Stable.  Diet recommendation:  Diet Order            Diet - low sodium heart healthy           Diet Heart Room service appropriate? Yes; Fluid consistency: Thin  Diet effective now                   Code Status: DNR     Hospital Course:   Kyle [name] is a 86 y.o. male with medical history significant of aortic stenosis, hypertension, hyperlipidemia, stroke, GERD, PAF on Eliquis, prostate cancer, sCHF, CAD, DES stent placement, gastric ulcer disease, anemia, CKD stage III, gout, who presented to the hospital with shortness of breath.  He had been previously scheduled for cardiac catheterization for evaluation of aortic stenosis by his cardiologist, Dr. Saunders Revel.    He has chronic anemia but hemoglobin has been slowly trending downwards.  Hemoglobin on admission was 7.4.  He was admitted to the hospital for symptomatic anemia.  He was transfused with 1 unit of packed red blood cells.  He was also given 1 dose of IV Venofer prior to discharge.  He was evaluated by the cardiologist and he underwent left and right heart  catheterization.  This showed mild aortic stenosis and moderate pulmonary hypertension.  Thus 2D echo had overestimated aortic stenosis.  Previously scheduled TAVR was therefore canceled.  He was requiring 2 L/min oxygen via nasal cannula.  Attempt to wean him off of oxygen was unsuccessful.  Oxygen saturation on room air at rest was 88%.  He was therefore prescribed 2 L/min oxygen via nasal cannula for home use.  His condition has improved and he is deemed stable for discharge to home.  Discharge plan was discussed with the patient and his son, Mr. Deklin Bieler, over the phone.  Medical Consultants:    Cardiologist   Discharge Exam:    Vitals:   01/20/20 2109 01/21/20 0419 01/21/20 0819 01/21/20 1211  BP: 113/69 117/66 115/66 121/61  Pulse: 89 94 89 (!) 104  Resp: 18   16  Temp: 98.1 F (36.7 C) 100 F (37.8 C) 98.9 F (37.2 C) 98.9 F (37.2 C)  TempSrc: Oral Oral Oral Oral  SpO2: 96% 92% 97% 93%  Weight:  74.8 kg    Height:         GEN: NAD SKIN: No rash EYES: EOMI ENT: MMM CV: RRR PULM: CTA B ABD: soft, ND, NT, +BS CNS: AAO x 3, non focal EXT: No edema or tenderness   The results of significant diagnostics from this hospitalization (including imaging, microbiology, ancillary and laboratory) are listed below for reference.  Procedures and Diagnostic Studies:   DG Chest Port 1 View  Result Date: 01/18/2020 CLINICAL DATA:  Short of breath.  Post cardiac catheterization EXAM: PORTABLE CHEST 1 VIEW COMPARISON:  02/21/2017 FINDINGS: Hypoventilation with decreased lung volume. Bibasilar airspace disease left greater than right has progressed in the interval. Negative for heart failure or effusion. IMPRESSION: Hypoventilation with bibasilar airspace disease likely atelectasis. Electronically Signed   By: Franchot Gallo M.D.   On: 01/18/2020 14:52     Labs:   Basic Metabolic Panel: Recent Labs  Lab 01/14/20 1440 01/14/20 1440 01/18/20 0730 01/18/20 0730  01/19/20 0633 01/19/20 0633 01/20/20 0412 01/21/20 1111  NA 140  --  138  --  141  --  142 133*  K 3.9   < > 3.2*   < > 3.5   < > 3.8 3.5  CL 105  --  106  --  106  --  105 100  CO2 25  --  26  --  27  --  27 26  GLUCOSE 89  --  119*  --  99  --  113* 134*  BUN 17  --  14  --  12  --  15 23  CREATININE 1.16  --  1.29*  --  1.18  --  1.46* 1.41*  CALCIUM 9.0  --  8.8*  --  8.6*  --  8.7* 8.3*  MG  --   --   --   --  1.8  --   --   --    < > = values in this interval not displayed.   GFR Estimated Creatinine Clearance: 39.8 mL/min (A) (by C-G formula based on SCr of 1.41 mg/dL (H)). Liver Function Tests: No results for input(s): AST, ALT, ALKPHOS, BILITOT, PROT, ALBUMIN in the last 168 hours. No results for input(s): LIPASE, AMYLASE in the last 168 hours. No results for input(s): AMMONIA in the last 168 hours. Coagulation profile Recent Labs  Lab 01/18/20 0730  INR 1.1    CBC: Recent Labs  Lab 01/14/20 1440 01/18/20 0729 01/19/20 0633 01/20/20 0412 01/21/20 1111  WBC 7.2 6.7 8.1 11.3* 9.8  NEUTROABS 3.9  --   --  7.8* 6.8  HGB 8.0* 7.4* 8.8* 8.7* 8.1*  HCT 28.9* 25.7* 28.3* 29.6* 26.8*  MCV 78* 76.3* 72.4* 74.6* 74.2*  PLT 297 251 226 246 208   Cardiac Enzymes: No results for input(s): CKTOTAL, CKMB, CKMBINDEX, TROPONINI in the last 168 hours. BNP: Invalid input(s): POCBNP CBG: No results for input(s): GLUCAP in the last 168 hours. D-Dimer No results for input(s): DDIMER in the last 72 hours. Hgb A1c No results for input(s): HGBA1C in the last 72 hours. Lipid Profile No results for input(s): CHOL, HDL, LDLCALC, TRIG, CHOLHDL, LDLDIRECT in the last 72 hours. Thyroid function studies No results for input(s): TSH, T4TOTAL, T3FREE, THYROIDAB in the last 72 hours.  Invalid input(s): FREET3 Anemia work up No results for input(s): VITAMINB12, FOLATE, FERRITIN, TIBC, IRON, RETICCTPCT in the last 72 hours. Microbiology Recent Results (from the past 240 hour(s))    SARS CORONAVIRUS 2 (TAT 6-24 HRS) Nasopharyngeal Nasopharyngeal Swab     Status: None   Collection Time: 01/14/20  3:30 PM   Specimen: Nasopharyngeal Swab  Result Value Ref Range Status   SARS Coronavirus 2 NEGATIVE NEGATIVE Final    Comment: (NOTE) SARS-CoV-2 target nucleic acids are NOT DETECTED.  The SARS-CoV-2 RNA is generally detectable in upper and lower respiratory specimens during the acute  phase of infection. Negative results do not preclude SARS-CoV-2 infection, do not rule out co-infections with other pathogens, and should not be used as the sole basis for treatment or other patient management decisions. Negative results must be combined with clinical observations, patient history, and epidemiological information. The expected result is Negative.  Fact Sheet for Patients: SugarRoll.be  Fact Sheet for Healthcare Providers: https://www.woods-mathews.com/  This test is not yet approved or cleared by the [place] FDA and  has been authorized for detection and/or diagnosis of SARS-CoV-2 by FDA under an Emergency Use Authorization (EUA). This EUA will remain  in effect (meaning this test can be used) for the duration of the COVID-19 declaration under Se ction 564(b)(1) of the Act, 21 U.S.C. section 360bbb-3(b)(1), unless the authorization is terminated or revoked sooner.  Performed at [facility], [place] [address] [place], [place] [address]   Respiratory Panel by RT PCR (Flu A&B, Covid) - Nasopharyngeal Swab     Status: None   Collection Time: 01/18/20  5:57 PM   Specimen: Nasopharyngeal Swab  Result Value Ref Range Status   SARS Coronavirus 2 by RT PCR NEGATIVE NEGATIVE Final    Comment: (NOTE) SARS-CoV-2 target nucleic acids are NOT DETECTED.  The SARS-CoV-2 RNA is generally detectable in upper respiratoy specimens during the acute phase of infection. The lowest concentration of SARS-CoV-2 viral copies this assay  can detect is 131 copies/mL. A negative result does not preclude SARS-Cov-2 infection and should not be used as the sole basis for treatment or other patient management decisions. A negative result may occur with  improper specimen collection/handling, submission of specimen other than nasopharyngeal swab, presence of viral mutation(s) within the areas targeted by this assay, and inadequate number of viral copies (<131 copies/mL). A negative result must be combined with clinical observations, patient history, and epidemiological information. The expected result is Negative.  Fact Sheet for Patients:  PinkCheek.be  Fact Sheet for Healthcare Providers:  GravelBags.it  This test is no t yet approved or cleared by the [place] FDA and  has been authorized for detection and/or diagnosis of SARS-CoV-2 by FDA under an Emergency Use Authorization (EUA). This EUA will remain  in effect (meaning this test can be used) for the duration of the COVID-19 declaration under Section 564(b)(1) of the Act, 21 U.S.C. section 360bbb-3(b)(1), unless the authorization is terminated or revoked sooner.     Influenza A by PCR NEGATIVE NEGATIVE Final   Influenza B by PCR NEGATIVE NEGATIVE Final    Comment: (NOTE) The Xpert Xpress SARS-CoV-2/FLU/RSV assay is intended as an aid in  the diagnosis of influenza from Nasopharyngeal swab specimens and  should not be used as a sole basis for treatment. Nasal washings and  aspirates are unacceptable for Xpert Xpress SARS-CoV-2/FLU/RSV  testing.  Fact Sheet for Patients: PinkCheek.be  Fact Sheet for Healthcare Providers: GravelBags.it  This test is not yet approved or cleared by the [place] FDA and  has been authorized for detection and/or diagnosis of SARS-CoV-2 by  FDA under an Emergency Use Authorization (EUA). This EUA will remain    in effect (meaning this test can be used) for the duration of the  Covid-19 declaration under Section 564(b)(1) of the Act, 21  U.S.C. section 360bbb-3(b)(1), unless the authorization is  terminated or revoked. Performed at [facility], [address] [place], [place] [address]      Discharge Instructions:   Discharge Instructions    AMB referral to CHF clinic  Complete by: As directed    Avoid straining   Complete by: As directed    Call MD for:  difficulty breathing, headache or visual disturbances   Complete by: As directed    Call MD for:  extreme fatigue   Complete by: As directed    Call MD for:  hives   Complete by: As directed    Call MD for:  persistant dizziness or light-headedness   Complete by: As directed    Call MD for:  persistant nausea and vomiting   Complete by: As directed    Call MD for:  severe uncontrolled pain   Complete by: As directed    Call MD for:  temperature >100.4   Complete by: As directed    Diet - low sodium heart healthy   Complete by: As directed    Face-to-face encounter (required for Medicare/Medicaid patients)   Complete by: As directed    I [place] certify that this patient is under my care and that I, or a nurse practitioner or physician's assistant working with me, had a face-to-face encounter that meets the physician face-to-face encounter requirements with this patient on 01/21/2020. The encounter with the patient was in whole, or in part for the following medical condition(s) which is the primary reason for home health care (List medical condition): CHF, chronic hypoxemic respiratory failure   The encounter with the patient was in whole, or in part, for the following medical condition, which is the primary reason for home health care: CHF, chronic hypoxemic respiratory failure   I certify that, based on my findings, the following services are medically necessary home health services: Nursing   Reason for Medically  Necessary Home Health Services: Skilled Nursing- Skilled Assessment/Observation   My clinical findings support the need for the above services: Unable to leave home safely without assistance and/or assistive device   Further, I certify that my clinical findings support that this patient is homebound due to: Unable to leave home safely without assistance   For home use only DME oxygen   Complete by: As directed    Length of Need: Lifetime   Mode or (Route): Nasal cannula   Liters per Minute: 2   Frequency: Continuous (stationary and portable oxygen unit needed)   Oxygen conserving device: Yes   Oxygen delivery system: Gas   Heart Failure patients record your daily weight using the same scale at the same time of day   Complete by: As directed    Home Health   Complete by: As directed    To provide the following care/treatments: RN   Increase activity slowly   Complete by: As directed    STOP any activity that causes chest pain, shortness of breath, dizziness, sweating, or exessive weakness   Complete by: As directed      Allergies as of 01/21/2020      Reactions   Ivp Dye [iodinated Diagnostic Agents]    Disorientation    Other    General anesthesia - for about a day after causes confusion    Simvastatin    myalgias      Medication List    TAKE these medications   acetaminophen 325 MG tablet Commonly known as: TYLENOL Take 325-650 mg by mouth every 6 (six) hours as needed for moderate pain.   ASPERCREME ARTHRITIS PAIN EX Apply 1 application topically daily as needed (pain).   atorvastatin 80 MG tablet Commonly known as: LIPITOR TAKE 1 TABLET BY MOUTH EVERY DAY  AT 6PM   carvedilol 3.125 MG tablet Commonly known as: COREG Take 1 tablet (3.125 mg total) by mouth 2 (two) times daily.   Colcrys 0.6 MG tablet Generic drug: colchicine TAKE ONE TABLET TWICE DAILY AS NEEDED FOR JOINT SWELLING   Eliquis 5 MG Tabs tablet Generic drug: apixaban TAKE ONE TABLET BY MOUTH TWICE  DAILY   furosemide 40 MG tablet Commonly known as: LASIX Take 1 tablet (40 mg total) by mouth daily. What changed:   medication strength  how much to take   losartan 50 MG tablet Commonly known as: COZAAR TAKE 1 TABLET BY MOUTH EVERY DAY   LUBRICATING EYE DROPS OP Place 1 drop into both eyes daily as needed (dry/itchy eyes).   oxymetazoline 0.05 % nasal spray Commonly known as: AFRIN Place 1 spray into both nostrils daily as needed (nose bleeding).   pantoprazole 40 MG tablet Commonly known as: PROTONIX Take 1 tablet (40 mg total) by mouth daily. Please decrease dose to once a day.   traMADol 50 MG tablet Commonly known as: ULTRAM TAKE 1 TABLET BY MOUTH THREE TIMES DAILY AS NEEDED What changed:   how much to take  how to take this  when to take this  reasons to take this  additional instructions            Durable Medical Equipment  (From admission, onward)         Start     Ordered   01/21/20 0856  For home use only DME oxygen  Once       Question Answer Comment  Length of Need Lifetime   Mode or (Route) Nasal cannula   Liters per Minute 2   Frequency Continuous (stationary and portable oxygen unit needed)   Oxygen delivery system Gas      01/21/20 0856   01/21/20 0000  For home use only DME oxygen       Question Answer Comment  Length of Need Lifetime   Mode or (Route) Nasal cannula   Liters per Minute 2   Frequency Continuous (stationary and portable oxygen unit needed)   Oxygen conserving device Yes   Oxygen delivery system Gas      01/21/20 1225            Time coordinating discharge: 33 minutes  Signed:  Keiko Myricks  [facility] 01/21/2020, 1:19 PM   Pager on [email or web address]. If 7PM-7AM, please contact night-coverage at [email or web address]

## 2020-01-21 NOTE — Progress Notes (Signed)
[name] to be D/C'd Home with home health per MD order.  Discussed prescriptions and follow up appointments with the patient and son. Prescriptions given to patient, medication list explained in detail. Pt verbalized understanding.  Allergies as of 01/21/2020      Reactions   Ivp Dye [iodinated Diagnostic Agents]    Disorientation    Other    General anesthesia - for about a day after causes confusion    Simvastatin    myalgias      Medication List    TAKE these medications   acetaminophen 325 MG tablet Commonly known as: TYLENOL Take 325-650 mg by mouth every 6 (six) hours as needed for moderate pain.   ASPERCREME ARTHRITIS PAIN EX Apply 1 application topically daily as needed (pain).   atorvastatin 80 MG tablet Commonly known as: LIPITOR TAKE 1 TABLET BY MOUTH EVERY DAY AT 6PM   carvedilol 3.125 MG tablet Commonly known as: COREG Take 1 tablet (3.125 mg total) by mouth 2 (two) times daily.   Colcrys 0.6 MG tablet Generic drug: colchicine TAKE ONE TABLET TWICE DAILY AS NEEDED FOR JOINT SWELLING   Eliquis 5 MG Tabs tablet Generic drug: apixaban TAKE ONE TABLET BY MOUTH TWICE DAILY   furosemide 40 MG tablet Commonly known as: LASIX Take 1 tablet (40 mg total) by mouth daily. What changed:   medication strength  how much to take   losartan 50 MG tablet Commonly known as: COZAAR TAKE 1 TABLET BY MOUTH EVERY DAY   LUBRICATING EYE DROPS OP Place 1 drop into both eyes daily as needed (dry/itchy eyes).   oxymetazoline 0.05 % nasal spray Commonly known as: AFRIN Place 1 spray into both nostrils daily as needed (nose bleeding).   pantoprazole 40 MG tablet Commonly known as: PROTONIX Take 1 tablet (40 mg total) by mouth daily. Please decrease dose to once a day.   traMADol 50 MG tablet Commonly known as: ULTRAM TAKE 1 TABLET BY MOUTH THREE TIMES DAILY AS NEEDED What changed:   how much to take  how to take this  when to take this  reasons to take  this  additional instructions            Durable Medical Equipment  (From admission, onward)         Start     Ordered   01/21/20 0856  For home use only DME oxygen  Once       Question Answer Comment  Length of Need Lifetime   Mode or (Route) Nasal cannula   Liters per Minute 2   Frequency Continuous (stationary and portable oxygen unit needed)   Oxygen delivery system Gas      01/21/20 0856   01/21/20 0000  For home use only DME oxygen       Question Answer Comment  Length of Need Lifetime   Mode or (Route) Nasal cannula   Liters per Minute 2   Frequency Continuous (stationary and portable oxygen unit needed)   Oxygen conserving device Yes   Oxygen delivery system Gas      01/21/20 1225          Vitals:   01/21/20 0819 01/21/20 1211  BP: 115/66 121/61  Pulse: 89 (!) 104  Resp:  16  Temp: 98.9 F (37.2 C) 98.9 F (37.2 C)  SpO2: 97% 93%    Tele box removed and returned. Skin clean, dry and intact without evidence of skin break down, no evidence of skin tears  noted. IV catheter discontinued intact. Site without signs and symptoms of complications. Dressing and pressure applied. Pt denies pain at this time. No complaints noted.  An After Visit Summary was printed and given to the patient. Patient escorted via [place], and D/C home via private auto.  Rolley Sims

## 2020-01-21 NOTE — Progress Notes (Signed)
Progress Note  Patient Name: Kyle [name] Date of Encounter: 01/21/2020  Primary Cardiologist: Rockey Situ  Subjective   Feels well this morning. Cough is improving. Wants to go home. Status post [facility] 9/30 as below. No chest pain.   Inpatient Medications    Scheduled Meds: . atorvastatin  80 mg Oral q1800  . carvedilol  3.125 mg Oral BID  . influenza vaccine adjuvanted  0.5 mL Intramuscular Tomorrow-1000  . pantoprazole  40 mg Oral Daily  . sodium chloride flush  3 mL Intravenous Q12H   Continuous Infusions:  PRN Meds: acetaminophen, albuterol, carboxymethylcellul-glycerin, colchicine, dextromethorphan-guaiFENesin, diclofenac Sodium, guaiFENesin, haloperidol lactate, hydrALAZINE, ondansetron (ZOFRAN) IV, oxymetazoline, traMADol   Vital Signs    Vitals:   01/20/20 1543 01/20/20 2109 01/21/20 0419 01/21/20 0819  BP: 138/86 113/69 117/66 115/66  Pulse: (!) 58 89 94 89  Resp: 17 18    Temp: (!) 97.5 F (36.4 C) 98.1 F (36.7 C) 100 F (37.8 C) 98.9 F (37.2 C)  TempSrc: Oral Oral Oral Oral  SpO2: 99% 96% 92% 97%  Weight:   74.8 kg   Height:        Intake/Output Summary (Last 24 hours) at 01/21/2020 0850 Last data filed at 01/21/2020 0700 Gross per 24 hour  Intake 66.37 ml  Output 850 ml  Net -783.63 ml   Filed Weights   01/20/20 0432 01/20/20 1032 01/21/20 0419  Weight: 77.7 kg 76.2 kg 74.8 kg    Telemetry    A. fib, 80s to 90s bpm, rare PVCs- Personally Reviewed  ECG    No new tracings - Personally Reviewed  Physical Exam   GEN: No acute distress.  Remains on supplemental oxygen via nasal cannula. Neck: No JVD. Cardiac:  Irregularly irregular, no murmurs, rubs, or gallops.  Respiratory:  Coarse breath sounds bilaterally.  GI: Soft, nontender, non-distended.   MS: No edema; No deformity. Neuro:  Alert and oriented x 3; Nonfocal.  Psych: Normal affect.  Labs    Chemistry Recent Labs  Lab 01/18/20 0730 01/19/20 0633 01/20/20 0412  NA 138 141  142  K 3.2* 3.5 3.8  CL 106 106 105  CO2 26 27 27   GLUCOSE 119* 99 113*  BUN 14 12 15   CREATININE 1.29* 1.18 1.46*  CALCIUM 8.8* 8.6* 8.7*  GFRNONAA 50* 56* 43*  GFRAA 58* >60 50*  ANIONGAP 6 8 10      Hematology Recent Labs  Lab 01/18/20 0729 01/18/20 0730 01/19/20 0633 01/20/20 0412  WBC 6.7  --  8.1 11.3*  RBC 3.37* 3.46* 3.91* 3.97*  HGB 7.4*  --  8.8* 8.7*  HCT 25.7*  --  28.3* 29.6*  MCV 76.3*  --  72.4* 74.6*  MCH 22.0*  --  22.5* 21.9*  MCHC 28.8*  --  31.1 29.4*  RDW 20.8*  --  20.6* 20.0*  PLT 251  --  226 246    Cardiac EnzymesNo results for input(s): TROPONINI in the last 168 hours. No results for input(s): TROPIPOC in the last 168 hours.   BNP Recent Labs  Lab 01/18/20 0730  BNP 703.8*     DDimer No results for input(s): DDIMER in the last 168 hours.   Radiology    Cardiac Studies   [facility] 01/20/2020: Coronary dominance: Right for codominant  Left mainstem:   Large vessel that bifurcates into the LAD and left circumflex, no significant disease noted  Left anterior descending (LAD):   Large vessel that extends to the apical region,  diagonal branch 2 of moderate size, no significant disease noted, mild luminal irregularities/nonobstructive disease.  LAD stent in the proximal vessel is patent with no significant in-stent restenosis  Left circumflex (LCx):  Large codominant vessel with OM branch 2, severe distal disease estimated 75%, followed by 80 to 90% disease of distal OM.  Right coronary artery (RCA):  Right codominant vessel with PL and PDA, severe proximal disease estimated 80%, sequential lesion 80-90% heavily calcified lesion at the bifurcation mid vessel.   Left ventriculography: Left ventricular systolic function is moderately reduced, LVEF is estimated at 35 to 40% though not well visualized, there is no significant mitral regurgitation , no significant aortic valve stenosis  Right heart pressures RA 12 RV 50/4/9 PA 56/19/33 Wedge  19 Cardiac output 4.46 Cardiac index 2.2   Final Conclusions:   Severe disease of the proximal mid RCA, heavily calcified, smaller codominant vessel Unchanged from prior catheterization -Patent stent LAD, Severe distal left circumflex disease --- JL4 catheter crossed aortic valve easily, gradient estimated 10 mmHg --- Significant hypoxia during the case, down to 83% on room air, nasal cannula oxygen placed Even with oxygen supplementation continued hypoxia as he is a mouth breather -Heavy congestion, coughing --Moderately elevated right heart pressures  Recommendations:  Moderate pulmonary hypertension as estimated by echo -Echocardiogram overestimated aortic valve stenosis which is likely mild on numbers obtained today -Appointment with TAVR clinic has been canceled -Medical management of right coronary disease and distal left circumflex disease recommended by interventional cardiology Case discussed with Dr. Fletcher Anon.  There does not appear to be significant change compared to prior catheterization -Given underlying renal dysfunction, hypoxia, is wanting to get off the table and go home, he would not be a good candidate for stenting at this time anyway --- Would recommend supplemental oxygen at home --Consider inhalers, Z-Pak, Mucinex, follow-up with pulmonary Son reports numerous recent pneumonias likely resulting in underlying lung disease and elevated right heart pressures -Wedge pressure mildly elevated, will need gentle diuresis after contrast load has passed __________  2D echo 01/12/2020: 1. Left ventricular ejection fraction, by estimation, is 35 to 40%. The  left ventricle has moderately decreased function. The left ventricle  demonstrates global hypokinesis. There is moderate concentric left  ventricular hypertrophy. Left ventricular  diastolic parameters are consistent with Grade II diastolic dysfunction  (pseudonormalization).  2. RVSP 39mmHg plus RA pressure. Atleast  moderate pulmonary hypertension.  Right ventricular systolic function is low normal. The right ventricular  size is moderately enlarged. Mildly increased right ventricular wall  thickness. There is moderately  elevated pulmonary artery systolic pressure.  3. Left atrial size was mildly dilated.  4. The mitral valve is normal in structure. Mild mitral valve  regurgitation.  5. Tricuspid valve regurgitation is moderate.  6. Patient has Low flow, low gradient severe aortic stenosis (PG 45mmHg,  Mean Gradient 49mmHg, DVI 0.21, AVA 0.89cm2). The aortic valve is  tricuspid. There is severe calcifcation of the aortic valve. Aortic valve  regurgitation is mild. Severe aortic  valve stenosis.  7. Aortic dilatation noted. There is mild dilatation of the ascending  aorta, measuring 42 mm.  Patient Profile     86 y.o. male with history of CAD s/p PCI, HFrEF, permanent Afib, aortic stenosis, CVA, CKD stage II-III, HTN, HLD, detached retina, prostate cancer, and GERD admitted with shortness of breath and found to have severe anemia who we are seeing for CAD, ICM, aortic stenosis, and A. fib.   Assessment & Plan  1.  CAD involving the native coronary arteries with stable angina: -Currently chest pain-free -Diagnostic cath this admission with severe disease in the proximal to mid RCA and distal LCx relatively unchanged when compared to prior cath with medical management recommended after discussion with interventional cardiology -On Eliquis in place of aspirin -Carvedilol -Lipitor -Post-cath instructions  2.  Aortic stenosis: -Appears to have been overestimated on surface echo with only mild aortic valve stenosis noted on diagnostic cath with heavy aortic valve calcification -Continue to monitor clinically -TAVR appointment has been canceled in light of the above  3.  HFrEF secondary to ICM/pulmonary hypertension: -He appears grossly euvolemic and well compensated -Carvedilol -Diuretic  has been held for the past 24 hours with mild AKI -If renal function allows resume PTA diuretic and losartan prior to discharge -Not currently on spironolactone secondary to CKD  4.  Permanent A. fib: -Ventricular rate well controlled -Resume PTA Eliquis if CBC allows with dosing pending BMP this morning  5.  Anemia: -CBC pending -Management per internal medicine  6.  CKD stage II-III: -BMP pending -Lasix and losartan remain held  7.  HLD: -Lipitor   For questions or updates, please contact [facility] Please consult [email or web address] for contact info under Cardiology/STEMI.    Signed, Christell Faith, PA-C [place] Pager: [phone] 01/21/2020, 8:50 AM

## 2020-01-21 NOTE — Care Management Important Message (Signed)
Important Message  Patient Details  Name: Kyle [name] MRN: [id number] Date of Birth: [birth year]   Medicare Important Message Given:  Yes     Dannette Barbara 01/21/2020, 12:36 PM

## 2020-01-23 DIAGNOSIS — I25119 Atherosclerotic heart disease of native coronary artery with unspecified angina pectoris: Secondary | ICD-10-CM | POA: Diagnosis not present

## 2020-01-23 DIAGNOSIS — I350 Nonrheumatic aortic (valve) stenosis: Secondary | ICD-10-CM | POA: Diagnosis not present

## 2020-01-23 DIAGNOSIS — M109 Gout, unspecified: Secondary | ICD-10-CM | POA: Diagnosis not present

## 2020-01-23 DIAGNOSIS — M1990 Unspecified osteoarthritis, unspecified site: Secondary | ICD-10-CM | POA: Diagnosis not present

## 2020-01-23 DIAGNOSIS — Z8673 Personal history of transient ischemic attack (TIA), and cerebral infarction without residual deficits: Secondary | ICD-10-CM | POA: Diagnosis not present

## 2020-01-23 DIAGNOSIS — J9611 Chronic respiratory failure with hypoxia: Secondary | ICD-10-CM | POA: Diagnosis not present

## 2020-01-23 DIAGNOSIS — K259 Gastric ulcer, unspecified as acute or chronic, without hemorrhage or perforation: Secondary | ICD-10-CM | POA: Diagnosis not present

## 2020-01-23 DIAGNOSIS — N1831 Chronic kidney disease, stage 3a: Secondary | ICD-10-CM | POA: Diagnosis not present

## 2020-01-23 DIAGNOSIS — Z8546 Personal history of malignant neoplasm of prostate: Secondary | ICD-10-CM | POA: Diagnosis not present

## 2020-01-23 DIAGNOSIS — D509 Iron deficiency anemia, unspecified: Secondary | ICD-10-CM | POA: Diagnosis not present

## 2020-01-23 DIAGNOSIS — I255 Ischemic cardiomyopathy: Secondary | ICD-10-CM | POA: Diagnosis not present

## 2020-01-23 DIAGNOSIS — Z9089 Acquired absence of other organs: Secondary | ICD-10-CM | POA: Diagnosis not present

## 2020-01-23 DIAGNOSIS — I272 Pulmonary hypertension, unspecified: Secondary | ICD-10-CM | POA: Diagnosis not present

## 2020-01-23 DIAGNOSIS — R7301 Impaired fasting glucose: Secondary | ICD-10-CM | POA: Diagnosis not present

## 2020-01-23 DIAGNOSIS — I4821 Permanent atrial fibrillation: Secondary | ICD-10-CM | POA: Diagnosis not present

## 2020-01-23 DIAGNOSIS — F40240 Claustrophobia: Secondary | ICD-10-CM | POA: Diagnosis not present

## 2020-01-23 DIAGNOSIS — I130 Hypertensive heart and chronic kidney disease with heart failure and stage 1 through stage 4 chronic kidney disease, or unspecified chronic kidney disease: Secondary | ICD-10-CM | POA: Diagnosis not present

## 2020-01-23 DIAGNOSIS — Z87891 Personal history of nicotine dependence: Secondary | ICD-10-CM | POA: Diagnosis not present

## 2020-01-23 DIAGNOSIS — E785 Hyperlipidemia, unspecified: Secondary | ICD-10-CM | POA: Diagnosis not present

## 2020-01-23 DIAGNOSIS — R3915 Urgency of urination: Secondary | ICD-10-CM | POA: Diagnosis not present

## 2020-01-23 DIAGNOSIS — Z79891 Long term (current) use of opiate analgesic: Secondary | ICD-10-CM | POA: Diagnosis not present

## 2020-01-23 DIAGNOSIS — K219 Gastro-esophageal reflux disease without esophagitis: Secondary | ICD-10-CM | POA: Diagnosis not present

## 2020-01-23 DIAGNOSIS — I5022 Chronic systolic (congestive) heart failure: Secondary | ICD-10-CM | POA: Diagnosis not present

## 2020-01-23 DIAGNOSIS — Z95828 Presence of other vascular implants and grafts: Secondary | ICD-10-CM | POA: Diagnosis not present

## 2020-01-23 DIAGNOSIS — I480 Paroxysmal atrial fibrillation: Secondary | ICD-10-CM | POA: Diagnosis not present

## 2020-01-24 ENCOUNTER — Telehealth: Payer: Self-pay

## 2020-01-24 ENCOUNTER — Ambulatory Visit: Payer: Medicare Other | Admitting: Cardiovascular Disease

## 2020-01-24 NOTE — Telephone Encounter (Signed)
1st attempt- Left message on voicemail to return my call. Need to complete TCM and schedule hospital follow up visit.  

## 2020-01-25 ENCOUNTER — Telehealth: Payer: Self-pay | Admitting: Family

## 2020-01-25 ENCOUNTER — Encounter: Payer: Self-pay | Admitting: Internal Medicine

## 2020-01-25 ENCOUNTER — Telehealth: Payer: Self-pay

## 2020-01-25 DIAGNOSIS — I130 Hypertensive heart and chronic kidney disease with heart failure and stage 1 through stage 4 chronic kidney disease, or unspecified chronic kidney disease: Secondary | ICD-10-CM | POA: Diagnosis not present

## 2020-01-25 DIAGNOSIS — I255 Ischemic cardiomyopathy: Secondary | ICD-10-CM | POA: Diagnosis not present

## 2020-01-25 DIAGNOSIS — I5022 Chronic systolic (congestive) heart failure: Secondary | ICD-10-CM | POA: Diagnosis not present

## 2020-01-25 DIAGNOSIS — N1831 Chronic kidney disease, stage 3a: Secondary | ICD-10-CM | POA: Diagnosis not present

## 2020-01-25 DIAGNOSIS — N390 Urinary tract infection, site not specified: Secondary | ICD-10-CM | POA: Diagnosis not present

## 2020-01-25 DIAGNOSIS — I25119 Atherosclerotic heart disease of native coronary artery with unspecified angina pectoris: Secondary | ICD-10-CM | POA: Diagnosis not present

## 2020-01-25 DIAGNOSIS — J9611 Chronic respiratory failure with hypoxia: Secondary | ICD-10-CM | POA: Diagnosis not present

## 2020-01-25 NOTE — Telephone Encounter (Signed)
2nd attempt- Left message on voicemail to return my call. Need to complete TCM and schedule hospital follow up.

## 2020-01-25 NOTE — Telephone Encounter (Signed)
Pat nurse with [facility] said she is presently at pts home for a prn visit due to pt having confusion,weakness,cloudy urine and bad urine odor. Temp 100.4; BP 130/68 P 74. Fraser Din wants to get order for U/A and C&S. Fraser Din also said when she arrived pt was on 2 L oxygen with long tubing and pulse ox was 71%; after changing tubing to short tubing and leaving the oxygen at 2 L pts pulse ox was 96%. Pat request order to increase oxygen to 3L since pt will be using the long tubing. Fraser Din is going to secure a urine for U/A and C&S before she leaves the pts home and request cb with order for U/A and C&S and to increase oxygen to 3 L. Spoke with Larene Beach CMA;Sending note to Dr Silvio Pate who is out of office and Avie Echevaria NP who is in office.[name]

## 2020-01-25 NOTE — Telephone Encounter (Signed)
Ok to increase O2 to 3 L, get urine C&S

## 2020-01-25 NOTE — Telephone Encounter (Signed)
Spoke to [employer]. Advised her that the son nor the pt have answered our TCM calls. Cardiology placed the [facility] orders. Gave her Regina's orders.

## 2020-01-25 NOTE — Telephone Encounter (Signed)
Transition Care Management Follow-up Telephone Call  Date of discharge and from where: 01/21/2020, [facility]  How have you been since you were released from the hospital? Son states that patient is not doing very good.They believe he has a UTI and he is running a fever. He states that the home health nurse has contacted Dr. Alla German office is waiting for a return call from the nurse.  Any questions or concerns? Yes, see above  Items Reviewed:  Did the pt receive and understand the discharge instructions provided? Yes   Medications obtained and verified? Yes   Any new allergies since your discharge? No   Dietary orders reviewed? Yes  Do you have support at home? Yes   Functional Questionnaire: (I = Independent and D = Dependent) ADLs: D  Bathing/Dressing- I  Meal Prep- D  Eating- I  Maintaining continence- I  Transferring/Ambulation- I  Managing Meds- D  Follow up appointments reviewed:   PCP Hospital f/u appt confirmed? No first available appointment is February 21, 2020. Forwarded to provider to see if patient can be worked in sooner.   [facility] f/u appt confirmed? N/A   Are transportation arrangements needed? No   If their condition worsens, is the pt aware to call PCP or go to the Emergency Dept.? Yes  Was the patient provided with contact information for the PCP's office or ED? Yes  Was to pt encouraged to call back with questions or concerns? Yes

## 2020-01-25 NOTE — Telephone Encounter (Signed)
Attempted to reach patient to schedule a new patient appointment after we received a referral from the hospitalist but was unable to reach him.   Enora Trillo, NT

## 2020-01-26 NOTE — Telephone Encounter (Signed)
When I spoke to [facility] yesterday, she was going to run a UA but did not have enough for a Culture. Based on the UA, she was going to have another nurse get another specimen for a culture. The UA results were supposedly faxed to [place] this morning, but we have not seen them. I will call Pat.

## 2020-01-26 NOTE — Telephone Encounter (Signed)
Please find a spot for him the week I get back--can be Thursday at 1:30 or at end of the day----or add on Tuesday or Wednesday

## 2020-01-26 NOTE — Telephone Encounter (Signed)
See what is going on this morning They did to the TCM call I am having Kyle [name] find a spot the week I get back. If he is having dysuria or blood---I would treat him empirically before the culture comes back

## 2020-01-26 NOTE — Telephone Encounter (Signed)
Spoke to [employer]. She turned it in this morning and results should in today. He did not have dysuria. Did not feel well. Urine was cloudy and was only able to give a small amount.

## 2020-01-26 NOTE — Telephone Encounter (Signed)
That is certainly non specific Will await the results before deciding on Rx

## 2020-01-27 ENCOUNTER — Telehealth: Payer: Self-pay

## 2020-01-27 NOTE — Telephone Encounter (Signed)
Uses colcrys only prn---okay to continue Okay on 2 liters of oxygen

## 2020-01-27 NOTE — Telephone Encounter (Signed)
Spoke to [place]. Gave verbal orders.

## 2020-01-27 NOTE — Telephone Encounter (Signed)
Orders for nursing 1W9 and 2PRN  System is telling her there is an interaction on carvedilol medication and colcry. Are you ok with giving those two medications?  Also can pt be on 2 liters of oxygen?  Cecille Rubin [phone]

## 2020-01-28 ENCOUNTER — Ambulatory Visit: Payer: Medicare Other | Admitting: Physician Assistant

## 2020-01-31 DIAGNOSIS — N1831 Chronic kidney disease, stage 3a: Secondary | ICD-10-CM | POA: Diagnosis not present

## 2020-01-31 DIAGNOSIS — I5022 Chronic systolic (congestive) heart failure: Secondary | ICD-10-CM | POA: Diagnosis not present

## 2020-01-31 DIAGNOSIS — I25119 Atherosclerotic heart disease of native coronary artery with unspecified angina pectoris: Secondary | ICD-10-CM | POA: Diagnosis not present

## 2020-01-31 DIAGNOSIS — J9611 Chronic respiratory failure with hypoxia: Secondary | ICD-10-CM | POA: Diagnosis not present

## 2020-01-31 DIAGNOSIS — I255 Ischemic cardiomyopathy: Secondary | ICD-10-CM | POA: Diagnosis not present

## 2020-01-31 DIAGNOSIS — I130 Hypertensive heart and chronic kidney disease with heart failure and stage 1 through stage 4 chronic kidney disease, or unspecified chronic kidney disease: Secondary | ICD-10-CM | POA: Diagnosis not present

## 2020-01-31 NOTE — Telephone Encounter (Signed)
If he is better, his confusion is not likely related to a UTI which has not been treated. Still, I usually empirically treat men who have a positive culture and, with his age, any concern about incomplete emptying

## 2020-01-31 NOTE — Telephone Encounter (Signed)
No such fax received as of yet. Will be on the look out for results.

## 2020-01-31 NOTE — Telephone Encounter (Addendum)
Mendel Ryder nurse with [facility] called to ck on status of urine results that was faxed over. I cannot find the urine results and Mendel Ryder will refax to [phone] to Gentry Fitz NP attention;she is in office and Dr Silvio Pate is out of office but will send to DR Silvio Pate as PCP. Mendel Ryder saw pt earlier today and feels pt has improved from last wk and is doing OK today. Mendel Ryder request cb after urine results are reviewed.

## 2020-02-01 ENCOUNTER — Telehealth: Payer: Self-pay | Admitting: Internal Medicine

## 2020-02-01 DIAGNOSIS — I130 Hypertensive heart and chronic kidney disease with heart failure and stage 1 through stage 4 chronic kidney disease, or unspecified chronic kidney disease: Secondary | ICD-10-CM | POA: Diagnosis not present

## 2020-02-01 DIAGNOSIS — I5022 Chronic systolic (congestive) heart failure: Secondary | ICD-10-CM | POA: Diagnosis not present

## 2020-02-01 DIAGNOSIS — N1831 Chronic kidney disease, stage 3a: Secondary | ICD-10-CM | POA: Diagnosis not present

## 2020-02-01 DIAGNOSIS — I25119 Atherosclerotic heart disease of native coronary artery with unspecified angina pectoris: Secondary | ICD-10-CM | POA: Diagnosis not present

## 2020-02-01 DIAGNOSIS — J9611 Chronic respiratory failure with hypoxia: Secondary | ICD-10-CM | POA: Diagnosis not present

## 2020-02-01 DIAGNOSIS — I255 Ischemic cardiomyopathy: Secondary | ICD-10-CM | POA: Diagnosis not present

## 2020-02-01 NOTE — Telephone Encounter (Signed)
Yes that is fine

## 2020-02-01 NOTE — Telephone Encounter (Signed)
Kyle [name] from [facility] called to request verbal orders for her plan of care. She is requesting PT 2w4. She can be reached at [phone].

## 2020-02-01 NOTE — Telephone Encounter (Signed)
Verbal orders left on VM for Jatana in Dr Alla German absence

## 2020-02-01 NOTE — Telephone Encounter (Signed)
Please check  If he is still acting okay--no Rx (he is coming in next week). If still not right---send septra DS 1 bid x 5 days

## 2020-02-01 NOTE — Telephone Encounter (Signed)
Received the fax for the UA. 3+ leuks. Culture was not done.

## 2020-02-02 NOTE — Telephone Encounter (Signed)
Left message for Kyle [name] to see how he is doing

## 2020-02-03 DIAGNOSIS — I130 Hypertensive heart and chronic kidney disease with heart failure and stage 1 through stage 4 chronic kidney disease, or unspecified chronic kidney disease: Secondary | ICD-10-CM | POA: Diagnosis not present

## 2020-02-03 DIAGNOSIS — N1831 Chronic kidney disease, stage 3a: Secondary | ICD-10-CM | POA: Diagnosis not present

## 2020-02-03 DIAGNOSIS — J9611 Chronic respiratory failure with hypoxia: Secondary | ICD-10-CM | POA: Diagnosis not present

## 2020-02-03 DIAGNOSIS — I255 Ischemic cardiomyopathy: Secondary | ICD-10-CM | POA: Diagnosis not present

## 2020-02-03 DIAGNOSIS — I5022 Chronic systolic (congestive) heart failure: Secondary | ICD-10-CM | POA: Diagnosis not present

## 2020-02-03 DIAGNOSIS — I25119 Atherosclerotic heart disease of native coronary artery with unspecified angina pectoris: Secondary | ICD-10-CM | POA: Diagnosis not present

## 2020-02-07 ENCOUNTER — Other Ambulatory Visit: Payer: Self-pay | Admitting: Internal Medicine

## 2020-02-07 DIAGNOSIS — F40240 Claustrophobia: Secondary | ICD-10-CM

## 2020-02-07 DIAGNOSIS — I255 Ischemic cardiomyopathy: Secondary | ICD-10-CM | POA: Diagnosis not present

## 2020-02-07 DIAGNOSIS — R7301 Impaired fasting glucose: Secondary | ICD-10-CM

## 2020-02-07 DIAGNOSIS — N1831 Chronic kidney disease, stage 3a: Secondary | ICD-10-CM | POA: Diagnosis not present

## 2020-02-07 DIAGNOSIS — Z8673 Personal history of transient ischemic attack (TIA), and cerebral infarction without residual deficits: Secondary | ICD-10-CM

## 2020-02-07 DIAGNOSIS — I480 Paroxysmal atrial fibrillation: Secondary | ICD-10-CM | POA: Diagnosis not present

## 2020-02-07 DIAGNOSIS — E785 Hyperlipidemia, unspecified: Secondary | ICD-10-CM | POA: Diagnosis not present

## 2020-02-07 DIAGNOSIS — I4821 Permanent atrial fibrillation: Secondary | ICD-10-CM | POA: Diagnosis not present

## 2020-02-07 DIAGNOSIS — Z87891 Personal history of nicotine dependence: Secondary | ICD-10-CM

## 2020-02-07 DIAGNOSIS — M109 Gout, unspecified: Secondary | ICD-10-CM | POA: Diagnosis not present

## 2020-02-07 DIAGNOSIS — I25119 Atherosclerotic heart disease of native coronary artery with unspecified angina pectoris: Secondary | ICD-10-CM | POA: Diagnosis not present

## 2020-02-07 DIAGNOSIS — Z79891 Long term (current) use of opiate analgesic: Secondary | ICD-10-CM

## 2020-02-07 DIAGNOSIS — I272 Pulmonary hypertension, unspecified: Secondary | ICD-10-CM | POA: Diagnosis not present

## 2020-02-07 DIAGNOSIS — Z8546 Personal history of malignant neoplasm of prostate: Secondary | ICD-10-CM

## 2020-02-07 DIAGNOSIS — Z95828 Presence of other vascular implants and grafts: Secondary | ICD-10-CM

## 2020-02-07 DIAGNOSIS — I350 Nonrheumatic aortic (valve) stenosis: Secondary | ICD-10-CM | POA: Diagnosis not present

## 2020-02-07 DIAGNOSIS — K259 Gastric ulcer, unspecified as acute or chronic, without hemorrhage or perforation: Secondary | ICD-10-CM

## 2020-02-07 DIAGNOSIS — R3915 Urgency of urination: Secondary | ICD-10-CM

## 2020-02-07 DIAGNOSIS — I130 Hypertensive heart and chronic kidney disease with heart failure and stage 1 through stage 4 chronic kidney disease, or unspecified chronic kidney disease: Secondary | ICD-10-CM | POA: Diagnosis not present

## 2020-02-07 DIAGNOSIS — D509 Iron deficiency anemia, unspecified: Secondary | ICD-10-CM

## 2020-02-07 DIAGNOSIS — Z9089 Acquired absence of other organs: Secondary | ICD-10-CM

## 2020-02-07 DIAGNOSIS — J9611 Chronic respiratory failure with hypoxia: Secondary | ICD-10-CM | POA: Diagnosis not present

## 2020-02-07 DIAGNOSIS — K219 Gastro-esophageal reflux disease without esophagitis: Secondary | ICD-10-CM

## 2020-02-07 DIAGNOSIS — I5022 Chronic systolic (congestive) heart failure: Secondary | ICD-10-CM | POA: Diagnosis not present

## 2020-02-07 DIAGNOSIS — M1990 Unspecified osteoarthritis, unspecified site: Secondary | ICD-10-CM

## 2020-02-07 NOTE — Telephone Encounter (Signed)
Last filled 12-29-19 #90 Last OV 07-02-19 Next OV 02-10-20 [facility]

## 2020-02-09 DIAGNOSIS — J9611 Chronic respiratory failure with hypoxia: Secondary | ICD-10-CM | POA: Diagnosis not present

## 2020-02-09 DIAGNOSIS — I25119 Atherosclerotic heart disease of native coronary artery with unspecified angina pectoris: Secondary | ICD-10-CM | POA: Diagnosis not present

## 2020-02-09 DIAGNOSIS — N1831 Chronic kidney disease, stage 3a: Secondary | ICD-10-CM | POA: Diagnosis not present

## 2020-02-09 DIAGNOSIS — I5022 Chronic systolic (congestive) heart failure: Secondary | ICD-10-CM | POA: Diagnosis not present

## 2020-02-09 DIAGNOSIS — I130 Hypertensive heart and chronic kidney disease with heart failure and stage 1 through stage 4 chronic kidney disease, or unspecified chronic kidney disease: Secondary | ICD-10-CM | POA: Diagnosis not present

## 2020-02-09 DIAGNOSIS — I255 Ischemic cardiomyopathy: Secondary | ICD-10-CM | POA: Diagnosis not present

## 2020-02-10 ENCOUNTER — Other Ambulatory Visit: Payer: Self-pay

## 2020-02-10 ENCOUNTER — Encounter: Payer: Self-pay | Admitting: Internal Medicine

## 2020-02-10 ENCOUNTER — Ambulatory Visit (INDEPENDENT_AMBULATORY_CARE_PROVIDER_SITE_OTHER): Payer: Medicare Other | Admitting: Internal Medicine

## 2020-02-10 VITALS — BP 118/72 | HR 48 | Temp 97.8°F | Ht 72.0 in | Wt 164.0 lb

## 2020-02-10 DIAGNOSIS — I350 Nonrheumatic aortic (valve) stenosis: Secondary | ICD-10-CM

## 2020-02-10 DIAGNOSIS — I5022 Chronic systolic (congestive) heart failure: Secondary | ICD-10-CM

## 2020-02-10 DIAGNOSIS — I255 Ischemic cardiomyopathy: Secondary | ICD-10-CM

## 2020-02-10 DIAGNOSIS — D509 Iron deficiency anemia, unspecified: Secondary | ICD-10-CM

## 2020-02-10 DIAGNOSIS — I272 Pulmonary hypertension, unspecified: Secondary | ICD-10-CM | POA: Diagnosis not present

## 2020-02-10 DIAGNOSIS — Z23 Encounter for immunization: Secondary | ICD-10-CM | POA: Diagnosis not present

## 2020-02-10 DIAGNOSIS — I480 Paroxysmal atrial fibrillation: Secondary | ICD-10-CM | POA: Diagnosis not present

## 2020-02-10 LAB — CBC
HCT: 31.7 % — ABNORMAL LOW (ref 39.0–52.0)
Hemoglobin: 9.7 g/dL — ABNORMAL LOW (ref 13.0–17.0)
MCHC: 30.7 g/dL (ref 30.0–36.0)
MCV: 77.3 fl — ABNORMAL LOW (ref 78.0–100.0)
Platelets: 362 10*3/uL (ref 150.0–400.0)
RBC: 4.1 Mil/uL — ABNORMAL LOW (ref 4.22–5.81)
RDW: 26.5 % — ABNORMAL HIGH (ref 11.5–15.5)
WBC: 8.9 10*3/uL (ref 4.0–10.5)

## 2020-02-10 NOTE — Assessment & Plan Note (Signed)
Did get iron and blood Will recheck levels

## 2020-02-10 NOTE — Assessment & Plan Note (Signed)
Now seems to be compensated on furosmide, carvedilol, losartan

## 2020-02-10 NOTE — Assessment & Plan Note (Signed)
Likely from his CHF Improved respiratory status now and is on furosemide

## 2020-02-10 NOTE — Assessment & Plan Note (Signed)
Not severe No action needed

## 2020-02-10 NOTE — Progress Notes (Signed)
Subjective:    Patient ID: Kyle [name], male    DOB: 1932-02-28, 86 y.o.   MRN: [id number]  HPI Here with son for hospital follow up Sent home 10/1 This visit occurred during the SARS-CoV-2 public health emergency.  Safety protocols were in place, including screening questions prior to the visit, additional usage of staff PPE, and extensive cleaning of exam room while observing appropriate contact time as indicated for disinfecting solutions.   Reviewed hospital records Admitted with anemia (got transfused and IV iron), in CHF---diuresed Right and left heart cath--to evaluate for possible TAVR. AS actually not that bad just significant pulmonary hypertension  Sent home on oxygen Getting PT now and progressing per note from [place] the PT  No chest pain Will get DOE---but not bad Sleeping in bed--flat. No PND Just slight edema  Son/DIL living with him They are shopping and doing all instrumental ADLs Son helps with showers. Dresses himself Bathroom is independent---no incontinence  Acute confusion he had likely related to the oxygen  Current Outpatient Medications on File Prior to Visit  Medication Sig Dispense Refill  . acetaminophen (TYLENOL) 325 MG tablet Take 325-650 mg by mouth every 6 (six) hours as needed for moderate pain.    [name] atorvastatin (LIPITOR) 80 MG tablet TAKE 1 TABLET BY MOUTH EVERY DAY AT 6PM 90 tablet 3  . Carboxymethylcellul-Glycerin (LUBRICATING EYE DROPS OP) Place 1 drop into both eyes daily as needed (dry/itchy eyes).    . carvedilol (COREG) 3.125 MG tablet Take 1 tablet (3.125 mg total) by mouth 2 (two) times daily. 180 tablet 3  . COLCRYS 0.6 MG tablet TAKE ONE TABLET TWICE DAILY AS NEEDED FOR JOINT SWELLING 60 tablet 3  . Diclofenac Sodium (ASPERCREME ARTHRITIS PAIN EX) Apply 1 application topically daily as needed (pain).    [name] ELIQUIS 5 MG TABS tablet TAKE ONE TABLET BY MOUTH TWICE DAILY 180 tablet 1  . furosemide (LASIX) 40 MG tablet Take 1 tablet  (40 mg total) by mouth daily. 30 tablet 0  . losartan (COZAAR) 50 MG tablet TAKE 1 TABLET BY MOUTH EVERY DAY 90 tablet 3  . oxymetazoline (AFRIN) 0.05 % nasal spray Place 1 spray into both nostrils daily as needed (nose bleeding).    . pantoprazole (PROTONIX) 40 MG tablet Take 1 tablet (40 mg total) by mouth daily. Please decrease dose to once a day. 90 tablet 0  . traMADol (ULTRAM) 50 MG tablet Take 1 tablet (50 mg total) by mouth 3 (three) times daily as needed for severe pain. 90 tablet 0   No current facility-administered medications on file prior to visit.    Allergies  Allergen Reactions  . Ivp Dye [Iodinated Diagnostic Agents]     Disorientation   . Other     General anesthesia - for about a day after causes confusion   . Simvastatin     myalgias    Past Medical History:  Diagnosis Date  . Arthritis   . CAD (coronary artery disease)    a. 02/2015 NSTEMI/PCI: LM nl, LAd 63m (2.5x16 Synergy DES), D1 50ost, D2 50ost, RI 25ost, LCX 25ost/p, 75d (small), LPDA 90 (small), RCA 80p (small).  . Cancer [facility])    prostate CA  . Claustrophobia    "EXTREMELY" (03/02/2015)  . Detached retina    right  . GERD (gastroesophageal reflux disease)   . H/O dizziness   . HFrEF (heart failure with reduced ejection fraction) ([place])    a. 02/2015 Echo: EF 15-20%; b.  05/2015 Echo: EF 35-40%; c. 06/2018 Echo: EF 40-45%, DD, Nl RV fxn. RVSP 37.90mmHg. Mild LAE. Mild AS/AI; d. 12/2019 Echo: EF 35-40%, mod LVH, Gr2 DD, RVSP 50, mild LAE, mild MR, low flow, low grad Sev AS (peak grad 44mmHg, AVA 0.89cm2). Mild AI. Asc Ao 47mm.  [name] History of prostate cancer   . Hyperlipidemia   . Hypertension   . Impaired fasting glucose   . Ischemic cardiomyopathy    a. 02/2015 Echo: EF 15-20%; b. 05/2015 Echo: EF 35-40%; c. 06/2018 Echo: EF 40-45%; d. 12/2019 Echo: EF 35-40%.  [name] PAF (paroxysmal atrial fibrillation) ([place])    a. Rate controlled. CHA2DS2VASc = 7--> Eliquis.  . Severe aortic stenosis    a. 12/2019 Echo: low  flow, low grad Sev AS (peak grad 68mmHg, AVA 0.89cm2).  . Stroke [facility]) ~ 1995-02/2009 X 2   a. 02/2008 MRI brain: prior infarct involving R lbe of cerebellum w/ encephalomalcic change.  . Urgency of urination     Past Surgical History:  Procedure Laterality Date  . CARDIAC CATHETERIZATION  ~ 1995   Archie Endo 08/22/2010  . CARDIAC CATHETERIZATION N/A 03/02/2015   Procedure: Left Heart Cath and Coronary Angiography;  Surgeon: Jettie Booze, MD;  Location: [place] CV LAB;  Service: Cardiovascular;  Laterality: N/A;  . CARDIAC CATHETERIZATION  03/02/2015   Procedure: Coronary Stent Intervention;  Surgeon: Jettie Booze, MD;  Location: [place] CV LAB;  Service: Cardiovascular;;  . COLONOSCOPY W/ POLYPECTOMY    . COLONOSCOPY WITH PROPOFOL N/A 09/13/2019   Procedure: COLONOSCOPY WITH PROPOFOL;  Surgeon: Jonathon Bellows, MD;  Location: [facility] ENDOSCOPY;  Service: Gastroenterology;  Laterality: N/A;  . ESOPHAGOGASTRODUODENOSCOPY (EGD) WITH PROPOFOL N/A 01/31/2017   Procedure: ESOPHAGOGASTRODUODENOSCOPY (EGD) WITH PROPOFOL;  Surgeon: Lucilla Lame, MD;  Location: [facility] ENDOSCOPY;  Service: Endoscopy;  Laterality: N/A;  . ESOPHAGOGASTRODUODENOSCOPY (EGD) WITH PROPOFOL N/A 09/13/2019   Procedure: ESOPHAGOGASTRODUODENOSCOPY (EGD) WITH PROPOFOL;  Surgeon: Jonathon Bellows, MD;  Location: [facility] ENDOSCOPY;  Service: Gastroenterology;  Laterality: N/A;  . EYE SURGERY Right   . HERNIA REPAIR     hx UHR/notes 08/22/2010  . INGUINAL HERNIA REPAIR Left    Archie Endo 08/22/2010  . LUMBAR LAMINECTOMY/DECOMPRESSION MICRODISCECTOMY  01/24/2012   Procedure: LUMBAR LAMINECTOMY/DECOMPRESSION MICRODISCECTOMY 1 LEVEL;  Surgeon: Winfield Cunas, MD;  Location: [place] [facility]O [facility]S;  Service: Neurosurgery;  Laterality: Right;  RIGHT Lumbar Three-Four far lateral diskectomy  . [place]   Archie Endo 08/22/2010  . RETINAL DETACHMENT REPAIR W/ SCLERAL BUCKLE LE Right 08/2006   Archie Endo 08/22/2010  . RIGHT/LEFT HEART CATH AND CORONARY  ANGIOGRAPHY N/A 01/20/2020   Procedure: RIGHT/LEFT HEART CATH AND CORONARY ANGIOGRAPHY;  Surgeon: Minna Merritts, MD;  Location: [place] [facility];  Service: Cardiovascular;  Laterality: N/A;  . TONSILLECTOMY    . UMBILICAL HERNIA REPAIR     hx/notes 08/22/2010    Family History  Problem Relation Age of Onset  . Heart disease Mother   . Coronary artery disease Mother   . Heart disease Father   . Coronary artery disease Father   . Heart disease Brother   . Heart disease Brother   . Cancer Brother        prostate  . Diabetes Neg Hx     Social History   Socioeconomic History  . Marital status: Widowed    Spouse name: Not on file  . Number of children: 1  . Years of education: Not on file  . Highest education level: Not on  file  Occupational History  . Occupation: retired    Fish farm manager: RETIRED  Tobacco Use  . Smoking status: Never Smoker  . Smokeless tobacco: Former Systems developer    Types: Secondary school teacher  . Vaping Use: Former  Substance and Sexual Activity  . Alcohol use: No  . Drug use: No  . Sexual activity: Not on file  Other Topics Concern  . Not on file  Social History Narrative   Wife died 06/27/22   Has living will   Son is now his health care POA   Would accept CPR but no prolonged machines.    Would not want a feeding tube if cognitively unaware         Social Determinants of Health   Financial Resource Strain:   . Difficulty of Paying Living Expenses: Not on file  Food Insecurity:   . Worried About Charity fundraiser in the Last Year: Not on file  . Ran Out of Food in the Last Year: Not on file  Transportation Needs:   . Lack of Transportation (Medical): Not on file  . Lack of Transportation (Non-Medical): Not on file  Physical Activity:   . Days of Exercise per Week: Not on file  . Minutes of Exercise per Session: Not on file  Stress:   . Feeling of Stress : Not on file  Social Connections:   . Frequency of Communication with Friends and Family: Not  on file  . Frequency of Social Gatherings with Friends and Family: Not on file  . Attends Religious Services: Not on file  . Active Member of Clubs or Organizations: Not on file  . Attends Archivist Meetings: Not on file  . Marital Status: Not on file  Intimate Partner Violence:   . Fear of Current or Ex-Partner: Not on file  . Emotionally Abused: Not on file  . Physically Abused: Not on file  . Sexually Abused: Not on file   Review of Systems Some question about Parkinson's---but no tremor (other than when he was on oxygen) Appetite is "pretty good" Weight is down 7# since admission Takes the furosemide daily ----occasionally cuts it in half Lots of nocturia at times    Objective:   Physical Exam Constitutional:      Appearance: Normal appearance.  Cardiovascular:     Rate and Rhythm: Normal rate. Rhythm irregular.     Heart sounds: No gallop.      Comments: Soft coarse systolic murmur Pulmonary:     Effort: Pulmonary effort is normal.     Breath sounds: No wheezing or rales.     Comments: Some basilar rhonchi Musculoskeletal:     Cervical back: Neck supple.     Right lower leg: No edema.     Left lower leg: No edema.  Lymphadenopathy:     Cervical: No cervical adenopathy.  Neurological:     Mental Status: He is alert.     Comments: No tremor Slow gait but not really Parkinsonian            Assessment & Plan:

## 2020-02-10 NOTE — Assessment & Plan Note (Signed)
Irregular now but rate fine Is on the eliquis

## 2020-02-11 LAB — RENAL FUNCTION PANEL
Albumin: 3.3 g/dL — ABNORMAL LOW (ref 3.5–5.2)
BUN: 12 mg/dL (ref 6–23)
CO2: 33 mEq/L — ABNORMAL HIGH (ref 19–32)
Calcium: 9.1 mg/dL (ref 8.4–10.5)
Chloride: 100 mEq/L (ref 96–112)
Creatinine, Ser: 1.15 mg/dL (ref 0.40–1.50)
GFR: 62 mL/min (ref >60.00)
Glucose, Bld: 101 mg/dL — ABNORMAL HIGH (ref 70–99)
Phosphorus: 3.2 mg/dL (ref 2.3–4.6)
Potassium: 4 mEq/L (ref 3.5–5.1)
Sodium: 139 mEq/L (ref 135–145)

## 2020-02-14 DIAGNOSIS — N1831 Chronic kidney disease, stage 3a: Secondary | ICD-10-CM | POA: Diagnosis not present

## 2020-02-14 DIAGNOSIS — I25119 Atherosclerotic heart disease of native coronary artery with unspecified angina pectoris: Secondary | ICD-10-CM | POA: Diagnosis not present

## 2020-02-14 DIAGNOSIS — I255 Ischemic cardiomyopathy: Secondary | ICD-10-CM | POA: Diagnosis not present

## 2020-02-14 DIAGNOSIS — I5022 Chronic systolic (congestive) heart failure: Secondary | ICD-10-CM | POA: Diagnosis not present

## 2020-02-14 DIAGNOSIS — J9611 Chronic respiratory failure with hypoxia: Secondary | ICD-10-CM | POA: Diagnosis not present

## 2020-02-14 DIAGNOSIS — I130 Hypertensive heart and chronic kidney disease with heart failure and stage 1 through stage 4 chronic kidney disease, or unspecified chronic kidney disease: Secondary | ICD-10-CM | POA: Diagnosis not present

## 2020-02-16 DIAGNOSIS — I25119 Atherosclerotic heart disease of native coronary artery with unspecified angina pectoris: Secondary | ICD-10-CM | POA: Diagnosis not present

## 2020-02-16 DIAGNOSIS — I255 Ischemic cardiomyopathy: Secondary | ICD-10-CM | POA: Diagnosis not present

## 2020-02-16 DIAGNOSIS — J9611 Chronic respiratory failure with hypoxia: Secondary | ICD-10-CM | POA: Diagnosis not present

## 2020-02-16 DIAGNOSIS — I5022 Chronic systolic (congestive) heart failure: Secondary | ICD-10-CM | POA: Diagnosis not present

## 2020-02-16 DIAGNOSIS — N1831 Chronic kidney disease, stage 3a: Secondary | ICD-10-CM | POA: Diagnosis not present

## 2020-02-16 DIAGNOSIS — I130 Hypertensive heart and chronic kidney disease with heart failure and stage 1 through stage 4 chronic kidney disease, or unspecified chronic kidney disease: Secondary | ICD-10-CM | POA: Diagnosis not present

## 2020-02-18 DIAGNOSIS — N1831 Chronic kidney disease, stage 3a: Secondary | ICD-10-CM | POA: Diagnosis not present

## 2020-02-18 DIAGNOSIS — I5022 Chronic systolic (congestive) heart failure: Secondary | ICD-10-CM | POA: Diagnosis not present

## 2020-02-18 DIAGNOSIS — J9611 Chronic respiratory failure with hypoxia: Secondary | ICD-10-CM | POA: Diagnosis not present

## 2020-02-18 DIAGNOSIS — I130 Hypertensive heart and chronic kidney disease with heart failure and stage 1 through stage 4 chronic kidney disease, or unspecified chronic kidney disease: Secondary | ICD-10-CM | POA: Diagnosis not present

## 2020-02-18 DIAGNOSIS — I255 Ischemic cardiomyopathy: Secondary | ICD-10-CM | POA: Diagnosis not present

## 2020-02-18 DIAGNOSIS — I25119 Atherosclerotic heart disease of native coronary artery with unspecified angina pectoris: Secondary | ICD-10-CM | POA: Diagnosis not present

## 2020-02-21 DIAGNOSIS — I255 Ischemic cardiomyopathy: Secondary | ICD-10-CM | POA: Diagnosis not present

## 2020-02-21 DIAGNOSIS — N1831 Chronic kidney disease, stage 3a: Secondary | ICD-10-CM | POA: Diagnosis not present

## 2020-02-21 DIAGNOSIS — J9611 Chronic respiratory failure with hypoxia: Secondary | ICD-10-CM | POA: Diagnosis not present

## 2020-02-21 DIAGNOSIS — I130 Hypertensive heart and chronic kidney disease with heart failure and stage 1 through stage 4 chronic kidney disease, or unspecified chronic kidney disease: Secondary | ICD-10-CM | POA: Diagnosis not present

## 2020-02-21 DIAGNOSIS — I5022 Chronic systolic (congestive) heart failure: Secondary | ICD-10-CM | POA: Diagnosis not present

## 2020-02-21 DIAGNOSIS — I25119 Atherosclerotic heart disease of native coronary artery with unspecified angina pectoris: Secondary | ICD-10-CM | POA: Diagnosis not present

## 2020-02-22 DIAGNOSIS — Z8546 Personal history of malignant neoplasm of prostate: Secondary | ICD-10-CM | POA: Diagnosis not present

## 2020-02-22 DIAGNOSIS — I5022 Chronic systolic (congestive) heart failure: Secondary | ICD-10-CM | POA: Diagnosis not present

## 2020-02-22 DIAGNOSIS — I350 Nonrheumatic aortic (valve) stenosis: Secondary | ICD-10-CM | POA: Diagnosis not present

## 2020-02-22 DIAGNOSIS — Z9089 Acquired absence of other organs: Secondary | ICD-10-CM | POA: Diagnosis not present

## 2020-02-22 DIAGNOSIS — K259 Gastric ulcer, unspecified as acute or chronic, without hemorrhage or perforation: Secondary | ICD-10-CM | POA: Diagnosis not present

## 2020-02-22 DIAGNOSIS — I480 Paroxysmal atrial fibrillation: Secondary | ICD-10-CM | POA: Diagnosis not present

## 2020-02-22 DIAGNOSIS — D509 Iron deficiency anemia, unspecified: Secondary | ICD-10-CM | POA: Diagnosis not present

## 2020-02-22 DIAGNOSIS — K219 Gastro-esophageal reflux disease without esophagitis: Secondary | ICD-10-CM | POA: Diagnosis not present

## 2020-02-22 DIAGNOSIS — F40240 Claustrophobia: Secondary | ICD-10-CM | POA: Diagnosis not present

## 2020-02-22 DIAGNOSIS — Z8673 Personal history of transient ischemic attack (TIA), and cerebral infarction without residual deficits: Secondary | ICD-10-CM | POA: Diagnosis not present

## 2020-02-22 DIAGNOSIS — J9611 Chronic respiratory failure with hypoxia: Secondary | ICD-10-CM | POA: Diagnosis not present

## 2020-02-22 DIAGNOSIS — E785 Hyperlipidemia, unspecified: Secondary | ICD-10-CM | POA: Diagnosis not present

## 2020-02-22 DIAGNOSIS — I272 Pulmonary hypertension, unspecified: Secondary | ICD-10-CM | POA: Diagnosis not present

## 2020-02-22 DIAGNOSIS — Z95828 Presence of other vascular implants and grafts: Secondary | ICD-10-CM | POA: Diagnosis not present

## 2020-02-22 DIAGNOSIS — R7301 Impaired fasting glucose: Secondary | ICD-10-CM | POA: Diagnosis not present

## 2020-02-22 DIAGNOSIS — M1990 Unspecified osteoarthritis, unspecified site: Secondary | ICD-10-CM | POA: Diagnosis not present

## 2020-02-22 DIAGNOSIS — Z79891 Long term (current) use of opiate analgesic: Secondary | ICD-10-CM | POA: Diagnosis not present

## 2020-02-22 DIAGNOSIS — I25119 Atherosclerotic heart disease of native coronary artery with unspecified angina pectoris: Secondary | ICD-10-CM | POA: Diagnosis not present

## 2020-02-22 DIAGNOSIS — I255 Ischemic cardiomyopathy: Secondary | ICD-10-CM | POA: Diagnosis not present

## 2020-02-22 DIAGNOSIS — N1831 Chronic kidney disease, stage 3a: Secondary | ICD-10-CM | POA: Diagnosis not present

## 2020-02-22 DIAGNOSIS — Z87891 Personal history of nicotine dependence: Secondary | ICD-10-CM | POA: Diagnosis not present

## 2020-02-22 DIAGNOSIS — I130 Hypertensive heart and chronic kidney disease with heart failure and stage 1 through stage 4 chronic kidney disease, or unspecified chronic kidney disease: Secondary | ICD-10-CM | POA: Diagnosis not present

## 2020-02-22 DIAGNOSIS — I4821 Permanent atrial fibrillation: Secondary | ICD-10-CM | POA: Diagnosis not present

## 2020-02-22 DIAGNOSIS — R3915 Urgency of urination: Secondary | ICD-10-CM | POA: Diagnosis not present

## 2020-02-22 DIAGNOSIS — M109 Gout, unspecified: Secondary | ICD-10-CM | POA: Diagnosis not present

## 2020-02-23 DIAGNOSIS — N1831 Chronic kidney disease, stage 3a: Secondary | ICD-10-CM | POA: Diagnosis not present

## 2020-02-23 DIAGNOSIS — I5022 Chronic systolic (congestive) heart failure: Secondary | ICD-10-CM | POA: Diagnosis not present

## 2020-02-23 DIAGNOSIS — I130 Hypertensive heart and chronic kidney disease with heart failure and stage 1 through stage 4 chronic kidney disease, or unspecified chronic kidney disease: Secondary | ICD-10-CM | POA: Diagnosis not present

## 2020-02-23 DIAGNOSIS — I25119 Atherosclerotic heart disease of native coronary artery with unspecified angina pectoris: Secondary | ICD-10-CM | POA: Diagnosis not present

## 2020-02-23 DIAGNOSIS — J9611 Chronic respiratory failure with hypoxia: Secondary | ICD-10-CM | POA: Diagnosis not present

## 2020-02-23 DIAGNOSIS — I255 Ischemic cardiomyopathy: Secondary | ICD-10-CM | POA: Diagnosis not present

## 2020-02-29 ENCOUNTER — Ambulatory Visit: Payer: Medicare Other | Admitting: Cardiovascular Disease

## 2020-03-09 ENCOUNTER — Telehealth: Payer: Self-pay | Admitting: Internal Medicine

## 2020-03-09 NOTE — Telephone Encounter (Signed)
I spoke to his son and he has "taken to bed"  and not eating much anymore. Does sound like hospice is appropriate. Referral made to [facility] Will put him on my home visit list.  Please cancel any office visits he may have

## 2020-03-09 NOTE — Telephone Encounter (Signed)
Patients son called in and states that they would like a referral to hospice. Patients son states that he can't walk and that he has not been out of the bed in over a week. He has taken a turn for the worse. He states there's no way they could bring him in. The home nurse's have not been to the home in over a month.  Best number [phone] [place] [facility]. Patient is not sleeping acts afraid to sleep.

## 2020-03-11 DIAGNOSIS — N189 Chronic kidney disease, unspecified: Secondary | ICD-10-CM | POA: Diagnosis not present

## 2020-03-11 DIAGNOSIS — I429 Cardiomyopathy, unspecified: Secondary | ICD-10-CM | POA: Diagnosis not present

## 2020-03-11 DIAGNOSIS — M109 Gout, unspecified: Secondary | ICD-10-CM | POA: Diagnosis not present

## 2020-03-11 DIAGNOSIS — E785 Hyperlipidemia, unspecified: Secondary | ICD-10-CM | POA: Diagnosis not present

## 2020-03-11 DIAGNOSIS — I350 Nonrheumatic aortic (valve) stenosis: Secondary | ICD-10-CM | POA: Diagnosis not present

## 2020-03-11 DIAGNOSIS — Z6822 Body mass index (BMI) 22.0-22.9, adult: Secondary | ICD-10-CM | POA: Diagnosis not present

## 2020-03-11 DIAGNOSIS — K219 Gastro-esophageal reflux disease without esophagitis: Secondary | ICD-10-CM | POA: Diagnosis not present

## 2020-03-11 DIAGNOSIS — I2510 Atherosclerotic heart disease of native coronary artery without angina pectoris: Secondary | ICD-10-CM | POA: Diagnosis not present

## 2020-03-11 DIAGNOSIS — I130 Hypertensive heart and chronic kidney disease with heart failure and stage 1 through stage 4 chronic kidney disease, or unspecified chronic kidney disease: Secondary | ICD-10-CM | POA: Diagnosis not present

## 2020-03-11 DIAGNOSIS — I4891 Unspecified atrial fibrillation: Secondary | ICD-10-CM | POA: Diagnosis not present

## 2020-03-11 DIAGNOSIS — K279 Peptic ulcer, site unspecified, unspecified as acute or chronic, without hemorrhage or perforation: Secondary | ICD-10-CM | POA: Diagnosis not present

## 2020-03-11 DIAGNOSIS — I509 Heart failure, unspecified: Secondary | ICD-10-CM | POA: Diagnosis not present

## 2020-03-11 DIAGNOSIS — D631 Anemia in chronic kidney disease: Secondary | ICD-10-CM | POA: Diagnosis not present

## 2020-03-13 DIAGNOSIS — N189 Chronic kidney disease, unspecified: Secondary | ICD-10-CM | POA: Diagnosis not present

## 2020-03-13 DIAGNOSIS — I509 Heart failure, unspecified: Secondary | ICD-10-CM | POA: Diagnosis not present

## 2020-03-13 DIAGNOSIS — I429 Cardiomyopathy, unspecified: Secondary | ICD-10-CM | POA: Diagnosis not present

## 2020-03-13 DIAGNOSIS — I2510 Atherosclerotic heart disease of native coronary artery without angina pectoris: Secondary | ICD-10-CM | POA: Diagnosis not present

## 2020-03-13 DIAGNOSIS — I130 Hypertensive heart and chronic kidney disease with heart failure and stage 1 through stage 4 chronic kidney disease, or unspecified chronic kidney disease: Secondary | ICD-10-CM | POA: Diagnosis not present

## 2020-03-13 DIAGNOSIS — I350 Nonrheumatic aortic (valve) stenosis: Secondary | ICD-10-CM | POA: Diagnosis not present

## 2020-03-14 ENCOUNTER — Other Ambulatory Visit: Payer: Self-pay | Admitting: Internal Medicine

## 2020-03-14 DIAGNOSIS — I2510 Atherosclerotic heart disease of native coronary artery without angina pectoris: Secondary | ICD-10-CM | POA: Diagnosis not present

## 2020-03-14 DIAGNOSIS — I509 Heart failure, unspecified: Secondary | ICD-10-CM | POA: Diagnosis not present

## 2020-03-14 DIAGNOSIS — N189 Chronic kidney disease, unspecified: Secondary | ICD-10-CM | POA: Diagnosis not present

## 2020-03-14 DIAGNOSIS — I130 Hypertensive heart and chronic kidney disease with heart failure and stage 1 through stage 4 chronic kidney disease, or unspecified chronic kidney disease: Secondary | ICD-10-CM | POA: Diagnosis not present

## 2020-03-14 DIAGNOSIS — I429 Cardiomyopathy, unspecified: Secondary | ICD-10-CM | POA: Diagnosis not present

## 2020-03-14 DIAGNOSIS — I350 Nonrheumatic aortic (valve) stenosis: Secondary | ICD-10-CM | POA: Diagnosis not present

## 2020-03-14 MED ORDER — MORPHINE SULFATE (CONCENTRATE) 10 MG /0.5 ML PO SOLN: 10.0000 mg | ORAL | 0 refills | Status: AC | PRN

## 2020-03-22 ENCOUNTER — Ambulatory Visit: Payer: Medicare Other | Admitting: Internal Medicine

## 2020-03-22 NOTE — Progress Notes (Signed)
Message from April --hospice nurse Seems to be actively dying Will increase and refill roxanol Now is COVID positive also

## 2020-03-22 DEATH — deceased

## 2020-07-04 ENCOUNTER — Encounter: Payer: Medicare Other | Admitting: Internal Medicine
